# Patient Record
Sex: Male | Born: 1975 | Race: White | Hispanic: No | Marital: Single | Smoking: Never smoker
Health system: Southern US, Community
[De-identification: ages and names within clinical notes are randomized; demographics above are authoritative.]

## PROBLEM LIST (undated history)

## (undated) DIAGNOSIS — Z789 Other specified health status: Secondary | ICD-10-CM

## (undated) DIAGNOSIS — C801 Malignant (primary) neoplasm, unspecified: Secondary | ICD-10-CM

## (undated) HISTORY — PX: SQUAMOUS CELL CARCINOMA EXCISION: SHX2433

---

## 2014-04-23 ENCOUNTER — Encounter (HOSPITAL_COMMUNITY): Payer: Self-pay | Admitting: Emergency Medicine

## 2014-04-23 ENCOUNTER — Emergency Department (HOSPITAL_COMMUNITY): Payer: Medicaid Other

## 2014-04-23 ENCOUNTER — Inpatient Hospital Stay (HOSPITAL_COMMUNITY)
Admission: EM | Admit: 2014-04-23 | Discharge: 2014-04-27 | DRG: 356 | Disposition: A | Discharge status: Home / Self Care | Payer: Medicaid Other | Attending: Internal Medicine | Admitting: Internal Medicine

## 2014-04-23 DIAGNOSIS — I4581 Long QT syndrome: Secondary | ICD-10-CM | POA: Diagnosis present

## 2014-04-23 DIAGNOSIS — C778 Secondary and unspecified malignant neoplasm of lymph nodes of multiple regions: Secondary | ICD-10-CM | POA: Diagnosis present

## 2014-04-23 DIAGNOSIS — C169 Malignant neoplasm of stomach, unspecified: Secondary | ICD-10-CM | POA: Diagnosis present

## 2014-04-23 DIAGNOSIS — R9431 Abnormal electrocardiogram [ECG] [EKG]: Secondary | ICD-10-CM | POA: Diagnosis present

## 2014-04-23 DIAGNOSIS — C786 Secondary malignant neoplasm of retroperitoneum and peritoneum: Principal | ICD-10-CM | POA: Diagnosis present

## 2014-04-23 DIAGNOSIS — R634 Abnormal weight loss: Secondary | ICD-10-CM | POA: Diagnosis present

## 2014-04-23 DIAGNOSIS — R109 Unspecified abdominal pain: Secondary | ICD-10-CM | POA: Diagnosis present

## 2014-04-23 DIAGNOSIS — Z6841 Body Mass Index (BMI) 40.0 and over, adult: Secondary | ICD-10-CM | POA: Diagnosis not present

## 2014-04-23 DIAGNOSIS — C799 Secondary malignant neoplasm of unspecified site: Secondary | ICD-10-CM

## 2014-04-23 DIAGNOSIS — R180 Malignant ascites: Secondary | ICD-10-CM | POA: Diagnosis present

## 2014-04-23 DIAGNOSIS — Z85828 Personal history of other malignant neoplasm of skin: Secondary | ICD-10-CM

## 2014-04-23 DIAGNOSIS — E43 Unspecified severe protein-calorie malnutrition: Secondary | ICD-10-CM | POA: Diagnosis present

## 2014-04-23 DIAGNOSIS — C160 Malignant neoplasm of cardia: Secondary | ICD-10-CM | POA: Diagnosis present

## 2014-04-23 DIAGNOSIS — C801 Malignant (primary) neoplasm, unspecified: Secondary | ICD-10-CM

## 2014-04-23 DIAGNOSIS — R1084 Generalized abdominal pain: Secondary | ICD-10-CM

## 2014-04-23 DIAGNOSIS — R918 Other nonspecific abnormal finding of lung field: Secondary | ICD-10-CM | POA: Diagnosis present

## 2014-04-23 DIAGNOSIS — D500 Iron deficiency anemia secondary to blood loss (chronic): Secondary | ICD-10-CM | POA: Diagnosis present

## 2014-04-23 HISTORY — DX: Other specified health status: Z78.9

## 2014-04-23 LAB — URINALYSIS, ROUTINE W REFLEX MICROSCOPIC
BILIRUBIN URINE: NEGATIVE
Glucose, UA: NEGATIVE mg/dL
Hgb urine dipstick: NEGATIVE
Ketones, ur: NEGATIVE mg/dL
LEUKOCYTES UA: NEGATIVE
NITRITE: NEGATIVE
Protein, ur: NEGATIVE mg/dL
SPECIFIC GRAVITY, URINE: 1.022 (ref 1.005–1.030)
UROBILINOGEN UA: 1 mg/dL (ref 0.0–1.0)
pH: 7 (ref 5.0–8.0)

## 2014-04-23 LAB — COMPREHENSIVE METABOLIC PANEL
ALK PHOS: 101 U/L (ref 39–117)
ALT: 14 U/L (ref 0–53)
AST: 22 U/L (ref 0–37)
Albumin: 2.8 g/dL — ABNORMAL LOW (ref 3.5–5.2)
Anion gap: 17 — ABNORMAL HIGH (ref 5–15)
BUN: 18 mg/dL (ref 6–23)
CHLORIDE: 95 meq/L — AB (ref 96–112)
CO2: 23 mEq/L (ref 19–32)
Calcium: 9.1 mg/dL (ref 8.4–10.5)
Creatinine, Ser: 0.97 mg/dL (ref 0.50–1.35)
GFR calc Af Amer: >90 mL/min (ref >90)
GFR calc non Af Amer: >90 mL/min (ref >90)
Glucose, Bld: 113 mg/dL — ABNORMAL HIGH (ref 70–99)
Potassium: 4.1 mEq/L (ref 3.7–5.3)
Sodium: 135 mEq/L — ABNORMAL LOW (ref 137–147)
TOTAL PROTEIN: 7.4 g/dL (ref 6.0–8.3)
Total Bilirubin: 0.5 mg/dL (ref 0.3–1.2)

## 2014-04-23 LAB — CBC WITH DIFFERENTIAL/PLATELET
BASOS PCT: 0 % (ref 0–1)
Basophils Absolute: 0 10*3/uL (ref 0.0–0.1)
Eosinophils Absolute: 0 10*3/uL (ref 0.0–0.7)
Eosinophils Relative: 0 % (ref 0–5)
HEMATOCRIT: 38.6 % — AB (ref 39.0–52.0)
Hemoglobin: 12.5 g/dL — ABNORMAL LOW (ref 13.0–17.0)
Lymphocytes Relative: 16 % (ref 12–46)
Lymphs Abs: 1.7 10*3/uL (ref 0.7–4.0)
MCH: 24.5 pg — ABNORMAL LOW (ref 26.0–34.0)
MCHC: 32.4 g/dL (ref 30.0–36.0)
MCV: 75.5 fL — AB (ref 78.0–100.0)
MONOS PCT: 11 % (ref 3–12)
Monocytes Absolute: 1.3 10*3/uL — ABNORMAL HIGH (ref 0.1–1.0)
NEUTROS ABS: 8 10*3/uL — AB (ref 1.7–7.7)
Neutrophils Relative %: 73 % (ref 43–77)
Platelets: 579 10*3/uL — ABNORMAL HIGH (ref 150–400)
RBC: 5.11 MIL/uL (ref 4.22–5.81)
RDW: 14.5 % (ref 11.5–15.5)
WBC: 11.1 10*3/uL — ABNORMAL HIGH (ref 4.0–10.5)

## 2014-04-23 LAB — APTT: aPTT: 31 seconds (ref 24–37)

## 2014-04-23 LAB — I-STAT TROPONIN, ED: Troponin i, poc: 0 ng/mL (ref 0.00–0.08)

## 2014-04-23 LAB — PROTIME-INR
INR: 1.18 (ref 0.00–1.49)
Prothrombin Time: 15.2 seconds (ref 11.6–15.2)

## 2014-04-23 LAB — LIPASE, BLOOD: Lipase: 25 U/L (ref 11–59)

## 2014-04-23 MED ORDER — MORPHINE SULFATE 2 MG/ML IJ SOLN
2.0000 mg | INTRAMUSCULAR | Status: DC | PRN
  Administered 2014-04-24 – 2014-04-25 (×3): 2 mg via INTRAVENOUS
  Filled 2014-04-23 (×3): qty 1

## 2014-04-23 MED ORDER — IOHEXOL 300 MG/ML  SOLN
100.0000 mL | Freq: Once | INTRAMUSCULAR | Status: AC | PRN
  Administered 2014-04-23: 100 mL via INTRAVENOUS

## 2014-04-23 MED ORDER — MORPHINE SULFATE 4 MG/ML IJ SOLN
6.0000 mg | Freq: Once | INTRAMUSCULAR | Status: AC
  Administered 2014-04-23: 6 mg via INTRAVENOUS
  Filled 2014-04-23: qty 2

## 2014-04-23 MED ORDER — MORPHINE SULFATE ER 15 MG PO TBCR
15.0000 mg | EXTENDED_RELEASE_TABLET | Freq: Two times a day (BID) | ORAL | Status: DC
  Administered 2014-04-23 – 2014-04-27 (×8): 15 mg via ORAL
  Filled 2014-04-23 (×8): qty 1

## 2014-04-23 MED ORDER — HYDROXYZINE HCL 50 MG/ML IM SOLN
25.0000 mg | Freq: Four times a day (QID) | INTRAMUSCULAR | Status: DC | PRN
  Administered 2014-04-24: 25 mg via INTRAMUSCULAR
  Filled 2014-04-23 (×3): qty 0.5

## 2014-04-23 MED ORDER — ONDANSETRON HCL 4 MG/2ML IJ SOLN
4.0000 mg | Freq: Once | INTRAMUSCULAR | Status: AC
  Administered 2014-04-23: 4 mg via INTRAVENOUS
  Filled 2014-04-23: qty 2

## 2014-04-23 MED ORDER — CETYLPYRIDINIUM CHLORIDE 0.05 % MT LIQD
7.0000 mL | Freq: Two times a day (BID) | OROMUCOSAL | Status: DC
  Administered 2014-04-24 – 2014-04-26 (×4): 7 mL via OROMUCOSAL

## 2014-04-23 MED ORDER — FAMOTIDINE IN NACL 20-0.9 MG/50ML-% IV SOLN
20.0000 mg | Freq: Once | INTRAVENOUS | Status: AC
  Administered 2014-04-23: 20 mg via INTRAVENOUS
  Filled 2014-04-23: qty 50

## 2014-04-23 MED ORDER — HEPARIN SODIUM (PORCINE) 5000 UNIT/ML IJ SOLN
5000.0000 [IU] | Freq: Three times a day (TID) | INTRAMUSCULAR | Status: DC
  Administered 2014-04-23 – 2014-04-27 (×9): 5000 [IU] via SUBCUTANEOUS
  Filled 2014-04-23 (×14): qty 1

## 2014-04-23 MED ORDER — PANTOPRAZOLE SODIUM 40 MG IV SOLR
40.0000 mg | Freq: Once | INTRAVENOUS | Status: AC
  Administered 2014-04-23: 40 mg via INTRAVENOUS
  Filled 2014-04-23: qty 40

## 2014-04-23 MED ORDER — SODIUM CHLORIDE 0.9 % IV SOLN
INTRAVENOUS | Status: DC
  Administered 2014-04-23 – 2014-04-25 (×5): via INTRAVENOUS

## 2014-04-23 MED ORDER — CHLORHEXIDINE GLUCONATE 0.12 % MT SOLN
15.0000 mL | Freq: Two times a day (BID) | OROMUCOSAL | Status: DC
  Administered 2014-04-24 – 2014-04-27 (×7): 15 mL via OROMUCOSAL
  Filled 2014-04-23 (×10): qty 15

## 2014-04-23 MED ORDER — FAMOTIDINE IN NACL 20-0.9 MG/50ML-% IV SOLN
20.0000 mg | Freq: Two times a day (BID) | INTRAVENOUS | Status: DC
  Administered 2014-04-24 – 2014-04-27 (×7): 20 mg via INTRAVENOUS
  Filled 2014-04-23 (×7): qty 50

## 2014-04-23 NOTE — H&P (Signed)
[facility] History and Physical  Samuel Durham Samuel Durham:[id number] DOB: 10/23/1975 DOA: 04/23/2014  Referring physician: ED physician PCP: No primary care provider on file.  Specialists:   Chief Complaint: Abdominal pain   HPI: Samuel Durham is a 38 y.o. male with PMH of squamous cell skin cancer, who presents with abdominal pain.  Patient reports that he started having abdominal pain 4 month ago. It is located in the epigastric area. it was initially mild, intermittent pain. He did not pay much attention. During the last month, his abdominal pain has been progressively getting worse. He started having nausea and vomiting whenever he eats food. He also has loose stools. He has very mild, occasional acid reflux problem in the past. He reports 30 pounds of body weight loss over past 4 months. He has occasional mild dry cough, no chest pain or shortness of breath. No family history full gastric or esophageal cancer. One of her sisters has breast cancer.  Patient denies fever, chills, headaches, chest pain, SOB, dysuria, urgency, frequency, hematuria, skin rashes, joint pain or leg swelling.  In ED, CT-abd/pelvis showed possible esophageal or gastric primary malignancy, with metastatic lymphadenopathy in the upper abdomen and retroperitoneum, widespread intraperitoneal spread of disease, malignant ascites, and multiple pulmonary nodules in the visualize lung bases concerning for pulmonary metastasis. Patient is admitted to inpatient for further evaluation and treatment.  Review of Systems: As presented in the history of presenting illness, rest negative.  Where does patient live?  At home Can patient participate in ADLs? Yes  Allergy: No Known Allergies  Past Medical History  Diagnosis Date  . Medical history non-contributory     Past Surgical History  Procedure Laterality Date  . Squamous cell carcinoma excision  approx March 2015    removed from top of head    Social History:  reports  that he has never smoked. He has never used smokeless tobacco. He reports that he does not drink alcohol or use illicit drugs.  Family History:  Family History  Problem Relation Age of Onset  . Breast cancer Sister   . Hypertension Mother   . Hypertension Father      Prior to Admission medications   Not on File    Physical Exam: Filed Vitals:   04/23/14 1902 04/23/14 1925 04/23/14 2000 04/23/14 2101  BP: 125/77 141/92 124/74 143/84  Pulse: 86 84 78 85  Temp:    98.1 F (36.7 C)  TempSrc:    Oral  Resp: 19 18 20 18   SpO2: 96% 97% 95% 95%   General: Not in acute distress HEENT:       Eyes: PERRL, EOMI, no scleral icterus       ENT: No discharge from the ears and nose, no pharynx injection, no tonsillar enlargement.        Neck: No JVD, no bruit, no mass felt. Cardiac: S1/S2, RRR, No murmurs, No gallops or rubs Pulm: Good air movement bilaterally. Clear to auscultation bilaterally. No rales, wheezing, rhonchi or rubs. Abd: Soft, mildly distended, mildly tender over epigastric area, no rebound pain, no organomegaly, BS present Ext: No edema bilaterally. 2+DP/PT pulse bilaterally Musculoskeletal: No joint deformities, erythema, or stiffness, ROM full Skin: No rashes.  Neuro: Alert and oriented X3, cranial nerves II-XII grossly intact, muscle strength 5/5 in all extremeties, sensation to light touch intact.  Psych: Patient is not psychotic, no suicidal or hemocidal ideation.  Labs on Admission:  Basic Metabolic Panel:  Recent Labs Lab 04/23/14 1624  NA 135*  K 4.1  CL 95*  CO2 23  GLUCOSE 113*  BUN 18  CREATININE 0.97  CALCIUM 9.1   Liver Function Tests:  Recent Labs Lab 04/23/14 1624  AST 22  ALT 14  ALKPHOS 101  BILITOT 0.5  PROT 7.4  ALBUMIN 2.8*    Recent Labs Lab 04/23/14 1624  LIPASE 25   No results for input(s): AMMONIA in the last 168 hours. CBC:  Recent Labs Lab 04/23/14 1624  WBC 11.1*  NEUTROABS 8.0*  HGB 12.5*  HCT 38.6*  MCV  75.5*  PLT 579*   Cardiac Enzymes: No results for input(s): CKTOTAL, CKMB, CKMBINDEX, TROPONINI in the last 168 hours.  BNP (last 3 results) No results for input(s): PROBNP in the last 8760 hours. CBG: No results for input(s): GLUCAP in the last 168 hours.  Radiological Exams on Admission: Ct Abdomen Pelvis W Contrast  04/23/2014   CLINICAL DATA:  38 year old male with epigastric pain, vomiting and dysphagia for the past several months.  EXAM: CT ABDOMEN AND PELVIS WITH CONTRAST  TECHNIQUE: Multidetector CT imaging of the abdomen and pelvis was performed using the standard protocol following bolus administration of intravenous contrast.  CONTRAST:  11mL OMNIPAQUE IOHEXOL 300 MG/ML  SOLN  COMPARISON:  No priors.  FINDINGS: Lower chest: Several pulmonary nodules are noted in the visualized lung bases bilaterally, the largest of which measures up to 1 cm in the right lower lobe (image 12 of series 3). Profound thickening of the distal esophagus extending beyond the gastroesophageal junction into the proximal stomach.  Hepatobiliary: No cystic or solid hepatic lesions. No intra or extrahepatic biliary ductal dilatation. Gallbladder is remarkable for some amorphous increased attenuation dependently, likely to reflect some biliary sludge.  Pancreas: Fatty atrophy in the pancreas.  Otherwise, unremarkable.  Spleen: Unremarkable.  Adrenals/Urinary Tract: Bilateral adrenal glands and the right kidney are unremarkable in appearance. Exophytic 2.4 cm low-attenuation lesion in the anterior aspect of the upper pole of the left kidney is compatible with a simple cyst. No hydroureteronephrosis. Urinary bladder is normal in appearance.  Stomach/Bowel: Marked thickening of the gastric wall at the gastroesophageal junction, extending into the cardia and fundus of the stomach, most evident along the lesser curvature. This is contiguous with the previously mentioned esophageal wall thickening. No pathologic dilatation of  small bowel or colon.  Vascular/Lymphatic: Extensive upper abdominal lymphadenopathy, predominantly in the gastrohepatic ligament and celiac axis nodal stations, with the largest single lymph node measuring up to 3.7 x 3.0 cm in the celiac axis nodal station (image 27 of series 2). Portacaval lymphadenopathy measuring up to 1.7 cm. Notably, many of the enlarged celiac axis lymph nodes appear partially calcified, which could suggest a mucinous subtype of neoplasm. Enlarged left paraaortic retroperitoneal lymph nodes measuring up to 2.4 x 4.2 cm adjacent to the left renal vein (image 33 of series 2). No significant atherosclerotic disease in the abdominal or pelvic vasculature.  Reproductive: Prostate gland and seminal vesicles are unremarkable in appearance.  Other: Moderate volume of ascites, presumably malignant. Extensive soft tissue nodularity throughout the omentum, compatible with omental caking and widespread peritoneal metastasis. Additionally, there are other areas of enhancing nodularity along the peritoneal surface. Tiny ventral and umbilical hernias containing predominantly omental fat, including some omental implants. No pneumoperitoneum.  Musculoskeletal: There are no aggressive appearing lytic or blastic lesions noted in the visualized portions of the skeleton.  IMPRESSION: 1. Findings, as above, highly concerning for either esophageal or gastric primary malignancy, with metastatic lymphadenopathy in the  upper abdomen and retroperitoneum, widespread intraperitoneal spread of disease, malignant ascites, and multiple pulmonary nodules in the visualize lung bases concerning for pulmonary metastasis. Oncologic evaluation is recommended. 2. Additional incidental findings, as above. These results were called by telephone at the time of interpretation on 04/23/2014 at 7:53 pm to [place] , who verbally acknowledged these results.   Electronically Signed   By: Vinnie Langton M.D.   On: 04/23/2014  19:56    EKG: Independently reviewed.   Assessment/Plan Active Problems:   Metastatic cancer   Abdominal pain   QT prolongation  Abdominal pain: it is most likely caused by metastatic cancers as evidenced by CT scan. Lipase negative, urinalysis negative, troponin negative. Patient also has nausea, vomiting, cannot tolerate food, but not severely dehydrated. Hemodynamically stable on admission. Electrolytes okay.  -will admit to med-surge bed -Symptomatic management -Hydroxyzine for nausea (patient has prolonged QTc interval 568, not good candidate for Zofran) -MS contin and prn morphine for pain -IV pepcid -check INR/PTT -NPO after MN -may consult to Oncology in AM  Prolonged QTc interval: QTC 568 on admission. No chest pain. -Avoid QTc prolonging medications, such as Zofran -repeat EKG in AM  DVT ppx: SQ Heparin     Code Status: Full code Family Communication:  Yes, patient's  2 sisters  at bed side Disposition Plan: Admit to inpatient   Date of Service 04/23/2014    Ivor Costa [facility] Pager [phone]  If 7PM-7AM, please contact night-coverage [email or web address] Password [email or web address] 04/23/2014, 9:18 PM

## 2014-04-23 NOTE — ED Notes (Signed)
PA and this RN at bedside. PA discussing CT results.

## 2014-04-23 NOTE — ED Notes (Addendum)
Pt c/o central epigastric pain preventing him from eating, states when he eats something he vomits immediately, diarrhea. Pt states this started in September but has progressively worsened. Pt states that he has passed a minimal amount of stool.

## 2014-04-23 NOTE — ED Provider Notes (Signed)
CSN: [id number]     Arrival date & time 04/23/14  1602 History   First MD Initiated Contact with Patient 04/23/14 1606     Chief Complaint  Patient presents with  . Abdominal Pain  . Emesis     (Consider location/radiation/quality/duration/timing/severity/associated sxs/prior Treatment) HPI  Pt is a 38yo male presenting to ED with c/o epigastric pain associated with nausea and vomiting, gradually worsening since about September of this year.  Pt states pain is aching and sharp, waxes and wanes, 10/10 at worst. States food use to make it worse but for the last 2-3 days he has not been able to even drink any water as he vomites it back up. Also reports even the smell of some foods make him nauseated.  He reports trying antacid medications last month but they did not help so he discontinued the OTC medications. Reports having a BM about once a week but states recently he has been having diarrhea.  Denies blood or mucous in stool. Denies urinary symptoms. No hx of pancreatitis or kidney stones. Denies drinking etoh. Denies hx of abdominal surgeries.    Past Medical History  Diagnosis Date  . Medical history non-contributory    Past Surgical History  Procedure Laterality Date  . Squamous cell carcinoma excision  approx March 2015    removed from top of head   Family History  Problem Relation Age of Onset  . Breast cancer Sister   . Hypertension Mother   . Hypertension Father    History  Substance Use Topics  . Smoking status: Never Smoker   . Smokeless tobacco: Never Used  . Alcohol Use: No    Review of Systems  Constitutional: Negative for fever and chills.  Respiratory: Negative for cough and shortness of breath.   Cardiovascular: Negative for chest pain and palpitations.  Gastrointestinal: Positive for nausea, vomiting and abdominal pain. Negative for diarrhea and constipation.  All other systems reviewed and are negative.     Allergies  Review of patient's allergies  indicates no known allergies.  Home Medications   Prior to Admission medications   Not on File   BP 138/85 mmHg  Pulse 87  Temp(Src) 98.1 F (36.7 C) (Oral)  Resp 16  Ht 5\' 9"  (1.753 m)  Wt 288 lb 8 oz (130.863 kg)  BMI 42.58 kg/m2  SpO2 98% Physical Exam  Constitutional: He appears well-developed and well-nourished.  Morbidly obese male lying in exam bed, NAD.  HENT:  Head: Normocephalic and atraumatic.  Eyes: Conjunctivae are normal. No scleral icterus.  Neck: Normal range of motion.  Cardiovascular: Normal rate, regular rhythm and normal heart sounds.   Pulmonary/Chest: Effort normal and breath sounds normal. No respiratory distress. He has no wheezes. He has no rales. He exhibits no tenderness.  Abdominal: Soft. Bowel sounds are normal. He exhibits no distension and no mass. There is tenderness. There is no rebound and no guarding.  Obese abdomen, soft, tenderness in epigastrium as well as mid abdomen and RLQ.  No focal tenderness. Exam limited due to body habitus.   Musculoskeletal: Normal range of motion.  Neurological: He is alert.  Skin: Skin is warm and dry.  Nursing note and vitals reviewed.   ED Course  Procedures (including critical care time) Labs Review Labs Reviewed  CBC WITH DIFFERENTIAL - Abnormal; Notable for the following:    WBC 11.1 (*)    Hemoglobin 12.5 (*)    HCT 38.6 (*)    MCV 75.5 (*)  MCH 24.5 (*)    Platelets 579 (*)    Neutro Abs 8.0 (*)    Monocytes Absolute 1.3 (*)    All other components within normal limits  COMPREHENSIVE METABOLIC PANEL - Abnormal; Notable for the following:    Sodium 135 (*)    Chloride 95 (*)    Glucose, Bld 113 (*)    Albumin 2.8 (*)    Anion gap 17 (*)    All other components within normal limits  LIPASE, BLOOD  URINALYSIS, ROUTINE W REFLEX MICROSCOPIC  PROTIME-INR  APTT  I-STAT TROPOININ, ED    Imaging Review Ct Abdomen Pelvis W Contrast  04/23/2014   CLINICAL DATA:  38 year old male with  epigastric pain, vomiting and dysphagia for the past several months.  EXAM: CT ABDOMEN AND PELVIS WITH CONTRAST  TECHNIQUE: Multidetector CT imaging of the abdomen and pelvis was performed using the standard protocol following bolus administration of intravenous contrast.  CONTRAST:  12mL OMNIPAQUE IOHEXOL 300 MG/ML  SOLN  COMPARISON:  No priors.  FINDINGS: Lower chest: Several pulmonary nodules are noted in the visualized lung bases bilaterally, the largest of which measures up to 1 cm in the right lower lobe (image 12 of series 3). Profound thickening of the distal esophagus extending beyond the gastroesophageal junction into the proximal stomach.  Hepatobiliary: No cystic or solid hepatic lesions. No intra or extrahepatic biliary ductal dilatation. Gallbladder is remarkable for some amorphous increased attenuation dependently, likely to reflect some biliary sludge.  Pancreas: Fatty atrophy in the pancreas.  Otherwise, unremarkable.  Spleen: Unremarkable.  Adrenals/Urinary Tract: Bilateral adrenal glands and the right kidney are unremarkable in appearance. Exophytic 2.4 cm low-attenuation lesion in the anterior aspect of the upper pole of the left kidney is compatible with a simple cyst. No hydroureteronephrosis. Urinary bladder is normal in appearance.  Stomach/Bowel: Marked thickening of the gastric wall at the gastroesophageal junction, extending into the cardia and fundus of the stomach, most evident along the lesser curvature. This is contiguous with the previously mentioned esophageal wall thickening. No pathologic dilatation of small bowel or colon.  Vascular/Lymphatic: Extensive upper abdominal lymphadenopathy, predominantly in the gastrohepatic ligament and celiac axis nodal stations, with the largest single lymph node measuring up to 3.7 x 3.0 cm in the celiac axis nodal station (image 27 of series 2). Portacaval lymphadenopathy measuring up to 1.7 cm. Notably, many of the enlarged celiac axis lymph  nodes appear partially calcified, which could suggest a mucinous subtype of neoplasm. Enlarged left paraaortic retroperitoneal lymph nodes measuring up to 2.4 x 4.2 cm adjacent to the left renal vein (image 33 of series 2). No significant atherosclerotic disease in the abdominal or pelvic vasculature.  Reproductive: Prostate gland and seminal vesicles are unremarkable in appearance.  Other: Moderate volume of ascites, presumably malignant. Extensive soft tissue nodularity throughout the omentum, compatible with omental caking and widespread peritoneal metastasis. Additionally, there are other areas of enhancing nodularity along the peritoneal surface. Tiny ventral and umbilical hernias containing predominantly omental fat, including some omental implants. No pneumoperitoneum.  Musculoskeletal: There are no aggressive appearing lytic or blastic lesions noted in the visualized portions of the skeleton.  IMPRESSION: 1. Findings, as above, highly concerning for either esophageal or gastric primary malignancy, with metastatic lymphadenopathy in the upper abdomen and retroperitoneum, widespread intraperitoneal spread of disease, malignant ascites, and multiple pulmonary nodules in the visualize lung bases concerning for pulmonary metastasis. Oncologic evaluation is recommended. 2. Additional incidental findings, as above. These results were called by telephone  at the time of interpretation on 04/23/2014 at 7:53 pm to [place] , who verbally acknowledged these results.   Electronically Signed   By: Vinnie Langton M.D.   On: 04/23/2014 19:56     EKG Interpretation   Date/Time:  Tuesday April 23 2014 16:15:32 EST Ventricular Rate:  97 PR Interval:  137 QRS Duration: 87 QT Interval:  447 QTC Calculation: 568 R Axis:   75 Text Interpretation:  Sinus rhythm Prolonged QT interval no previous for  comparison Confirmed by HARRISON  MD, FORREST ([id number]) on 04/23/2014 4:20:22  PM      MDM   Final  diagnoses:  Metastatic cancer  Generalized abdominal pain    Pt is a 38yo pleasant male presenting to ED with reports of upper abdominal pain with nausea, vomiting and diarrhea. Gradually worsening since September 2015.  Pt has a morbidly obese abdomen, but otherwise appears well, afebrile. NAD.  Abdominal exam limited by body habitus, tenderness in upper, mid, and right side of abdomen.    Discussed pt with Dr. Aline Brochure, due to limited exam but reports of 10/10 pain, CT abdomen ordered. DDx: SBO, cholecystitis vs cholelithiasis, low concern for appendicitis, GERD, gastritis  Labs: elevated platelets at 579, and low albumin of 2.8, no previous labs to compare. Labs otherwise unremarkable including normal lipase.    CT abd: findings highly concerning for esophageal or gastric Primary malignancy, with mteastatic lymphadeopathy in upper abdomen and retroperitoneum, widespread intraperitoneal spread of disease, malignant ascites, and multiple pulmonary nodules in visualized lung bases, concerning for pulmonary metastasis.  Oncologic evaluation recommended.    Discussed results with pt as well as his sister who was accompanying pt.  Sister reports she recently went into remission from breast cancer. No other family hx of cancer.    Discussed pt with Dr. 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    Pt has been given IV morphine, zofran, pepcid, and protonix in ED which did help improve pain from 10/10 to 5/10.   8:38 PM Consulted with Dr. Blaine Hamper, [facility], will admit pt to a med-surg bed.  Pt is hemodynamically stable at this time.      Noland Fordyce, PA-C 04/24/14 3846  Pamella Pert, MD 04/25/14 [phone]

## 2014-04-23 NOTE — ED Notes (Signed)
Patient transported to CT 

## 2014-04-23 NOTE — ED Notes (Signed)
Unsuccessful attempts x2. Another RN to attempt

## 2014-04-23 NOTE — ED Notes (Signed)
Hospitalist at bedside 

## 2014-04-23 NOTE — ED Notes (Signed)
3W floor RN Kennyth Lose unable to take report at this time. She reports she will call back in 5 min.

## 2014-04-24 DIAGNOSIS — E43 Unspecified severe protein-calorie malnutrition: Secondary | ICD-10-CM | POA: Diagnosis present

## 2014-04-24 DIAGNOSIS — R109 Unspecified abdominal pain: Secondary | ICD-10-CM

## 2014-04-24 DIAGNOSIS — E46 Unspecified protein-calorie malnutrition: Secondary | ICD-10-CM

## 2014-04-24 DIAGNOSIS — D509 Iron deficiency anemia, unspecified: Secondary | ICD-10-CM

## 2014-04-24 DIAGNOSIS — R112 Nausea with vomiting, unspecified: Secondary | ICD-10-CM

## 2014-04-24 DIAGNOSIS — R1013 Epigastric pain: Secondary | ICD-10-CM

## 2014-04-24 DIAGNOSIS — D473 Essential (hemorrhagic) thrombocythemia: Secondary | ICD-10-CM

## 2014-04-24 DIAGNOSIS — Z85820 Personal history of malignant melanoma of skin: Secondary | ICD-10-CM

## 2014-04-24 DIAGNOSIS — C801 Malignant (primary) neoplasm, unspecified: Secondary | ICD-10-CM

## 2014-04-24 LAB — COMPREHENSIVE METABOLIC PANEL
ALBUMIN: 2.4 g/dL — AB (ref 3.5–5.2)
ALK PHOS: 87 U/L (ref 39–117)
ALT: 11 U/L (ref 0–53)
AST: 16 U/L (ref 0–37)
Anion gap: 12 (ref 5–15)
BILIRUBIN TOTAL: 0.4 mg/dL (ref 0.3–1.2)
BUN: 17 mg/dL (ref 6–23)
CHLORIDE: 96 meq/L (ref 96–112)
CO2: 26 mEq/L (ref 19–32)
Calcium: 8.7 mg/dL (ref 8.4–10.5)
Creatinine, Ser: 0.99 mg/dL (ref 0.50–1.35)
GFR calc Af Amer: >90 mL/min (ref >90)
GFR calc non Af Amer: >90 mL/min (ref >90)
Glucose, Bld: 91 mg/dL (ref 70–99)
POTASSIUM: 4.3 meq/L (ref 3.7–5.3)
SODIUM: 134 meq/L — AB (ref 137–147)
TOTAL PROTEIN: 6.4 g/dL (ref 6.0–8.3)

## 2014-04-24 LAB — CBC WITH DIFFERENTIAL/PLATELET
BASOS PCT: 0 % (ref 0–1)
Basophils Absolute: 0 10*3/uL (ref 0.0–0.1)
EOS ABS: 0.1 10*3/uL (ref 0.0–0.7)
Eosinophils Relative: 1 % (ref 0–5)
HEMATOCRIT: 36.4 % — AB (ref 39.0–52.0)
Hemoglobin: 11.3 g/dL — ABNORMAL LOW (ref 13.0–17.0)
Lymphocytes Relative: 15 % (ref 12–46)
Lymphs Abs: 1.4 10*3/uL (ref 0.7–4.0)
MCH: 23.7 pg — AB (ref 26.0–34.0)
MCHC: 31 g/dL (ref 30.0–36.0)
MCV: 76.5 fL — ABNORMAL LOW (ref 78.0–100.0)
MONO ABS: 1 10*3/uL (ref 0.1–1.0)
Monocytes Relative: 11 % (ref 3–12)
Neutro Abs: 6.7 10*3/uL (ref 1.7–7.7)
Neutrophils Relative %: 73 % (ref 43–77)
Platelets: 539 10*3/uL — ABNORMAL HIGH (ref 150–400)
RBC: 4.76 MIL/uL (ref 4.22–5.81)
RDW: 14.6 % (ref 11.5–15.5)
WBC: 9.2 10*3/uL (ref 4.0–10.5)

## 2014-04-24 LAB — GLUCOSE, CAPILLARY: Glucose-Capillary: 95 mg/dL (ref 70–99)

## 2014-04-24 LAB — MAGNESIUM: Magnesium: 2.2 mg/dL (ref 1.5–2.5)

## 2014-04-24 MED ORDER — DOCUSATE SODIUM 100 MG PO CAPS
100.0000 mg | ORAL_CAPSULE | Freq: Two times a day (BID) | ORAL | Status: DC
Start: 2014-04-24 — End: 2014-04-27
  Administered 2014-04-24 – 2014-04-27 (×6): 100 mg via ORAL
  Filled 2014-04-24 (×8): qty 1

## 2014-04-24 NOTE — Progress Notes (Signed)
INITIAL NUTRITION ASSESSMENT  DOCUMENTATION CODES Per approved criteria  -Severe malnutrition in the context of chronic illness  Pt meets criteria for severe MALNUTRITION in the context of chronic illness as evidenced by 12% weight loss x 3 months and energy intake <75% for >1 month.   INTERVENTION: -Encourage PO intake -RD to follow-up with pt in 1-2 days for nutritional supplementation per pt request  NUTRITION DIAGNOSIS: Inadequate oral intake related to N/V, abdominal pain as evidenced by 12% wt loss x 3 months.   Goal: Pt to meet >/= 90% of their estimated nutrition needs   Monitor:  PO and supplemental intake, weight, labs, I/O's  Reason for Assessment: Pt identified as at nutrition risk on the Malnutrition Screen Tool  Admitting Dx: Abdominal pain  ASSESSMENT: 38 y.o. male with PMH of squamous cell skin cancer, who presents with abdominal pain x 4 months. He started having nausea and vomiting whenever he eats food. He also has loose stools. He reports 30 pounds of body weight loss over past 4 months.  Pt reports 40 lb weight loss over the last 3-4 months (12% wt loss x 3 months, significant for time frame). Pt has not been able to keep food down during that time period d/t abdominal pain. Prior to this weight loss, pt reports intentional wt loss using Nutrisystem diet (UBW of 379 lb).  Per RN, pt is tolerating CL diet so far.  Pt would benefit from nutritional supplementation, pt would like to see how he tolerates his diet first before adding supplements. RD to follow-up with patient in 1-2 days.  Nutrition focused physical exam shows no sign of depletion of muscle mass or body fat.  Labs reviewed: Low Na  Height: Ht Readings from Last 1 Encounters:  04/23/14 5\' 9"  (1.753 m)    Weight: Wt Readings from Last 1 Encounters:  04/24/14 288 lb 12.8 oz (130.999 kg)    Ideal Body Weight: 160 lb  % Ideal Body Weight: 180%  Wt Readings from Last 10 Encounters:   04/24/14 288 lb 12.8 oz (130.999 kg)    Usual Body Weight: 379 -per pt in 2013  % Usual Body Weight: 76%  BMI:  Body mass index is 42.63 kg/(m^2).  Estimated Nutritional Needs: Kcal: 2600-2800 Protein: 125-135g Fluid: 2.6L/day  Skin: intact  Diet Order: Diet NPO time specified Except for: Sips with Meds  EDUCATION NEEDS: -Education not appropriate at this time   Intake/Output Summary (Last 24 hours) at 04/24/14 1155 Last data filed at 04/24/14 0548  Gross per 24 hour  Intake  964.6 ml  Output      0 ml  Net  964.6 ml    Last BM: 12/15  Labs:   Recent Labs Lab 04/23/14 1624 04/24/14 0845  NA 135* 134*  K 4.1 4.3  CL 95* 96  CO2 23 26  BUN 18 17  CREATININE 0.97 0.99  CALCIUM 9.1 8.7  MG  --  2.2  GLUCOSE 113* 91    CBG (last 3)   Recent Labs  04/24/14 0812  GLUCAP 95    Scheduled Meds: . antiseptic oral rinse  7 mL Mouth Rinse q12n4p  . chlorhexidine  15 mL Mouth Rinse BID  . famotidine (PEPCID) IV  20 mg Intravenous Q12H  . heparin  5,000 Units Subcutaneous 3 times per day  . morphine  15 mg Oral Q12H    Continuous Infusions: . sodium chloride 125 mL/hr at 04/24/14 0546    Past Medical History  Diagnosis  Date  . Medical history non-contributory     Past Surgical History  Procedure Laterality Date  . Squamous cell carcinoma excision  approx March 2015    removed from top of head    Clayton Bibles, MS, RD, LDN Pager: [phone] After Hours Pager: [phone]

## 2014-04-24 NOTE — Plan of Care (Signed)
Problem: Phase I Progression Outcomes Goal: Pain controlled with appropriate interventions Outcome: Completed/Met Date Met:  04/24/14 Pain well controlled with MS Contin and occasional prn IV Morphine.  Problem: Phase II Progression Outcomes Goal: Progress activity as tolerated unless otherwise ordered Outcome: Completed/Met Date Met:  04/24/14 Patient ambulating per self in room without difficulty.

## 2014-04-24 NOTE — Consult Note (Signed)
[place]  [phone]   HEMATOLOGY ONCOLOGY CONSULTATION   Cormick Moss  DOB: Nov 11, 1975  MR#: [id number]  CSN#: [id number]    Requesting Physician: [facility]   Primary MD: [facility]  History of present illness: Metastatic Cancer       HPI:  38 y.o. 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 He has experienced a 30 lbs weight loss in the process. He has noted increased abdominal girth. Patient denies any shortness of breath, chest pain, GI bleed, hematuria or lower extremity swelling. He denies heavy alcohol intake. Denies tobacco abuse. Denies risk factors for HIV or hepatitis. He denies any family history of GI malignancies.  CT of the abdomen and pelvis with contrast on 12/15 revealed several pulmonary nodules at the bases bilaterally, profound thickening of the distal esophagus extending beyond the gastroesophageal junction into the proximal stomach,cardia and fundus of the stomach, most evident along the lesser curvature.  No pathologic dilatation of small bowel or colon.  Extensive upper abdominal and retroperitoneal lymphadenopathy was visualized. Moderate volume of ascites, presumably malignant, was noted. Extensive soft tissue nodularity throughout the omentum, compatible with omental caking and widespread peritoneal metastasis. Additionally, there are other areas of enhancing nodularity along the peritoneal surface. Tiny ventral and umbilical hernias containing predominantly omental fat, including some omental implants.  There are no aggressive appearing lytic or blastic lesions noted in the visualized portions of the skeleton. No other staging CTs or tumor markers are available for review. We  have been kindly requested to see Mr. Dulworth with recommendations  Past medical history:   Squamous Cell Carcinoma of the scalp s/p excision, 08/2013  Seasonal allergies  Obesity  History of flu in April 2015  Past surgical history:      Past Surgical History  Procedure Laterality Date  . Squamous cell carcinoma excision  approx March 2015    removed from top of head    Medications:  Prior to Admission:  No prescriptions prior to admission   Scheduled Meds: . antiseptic oral rinse  7 mL Mouth Rinse q12n4p  . chlorhexidine  15 mL Mouth Rinse BID  . famotidine (PEPCID) IV  20 mg Intravenous Q12H  . heparin  5,000 Units Subcutaneous 3 times per day  . morphine  15 mg Oral Q12H   Continuous Infusions: . sodium chloride 125 mL/hr at 04/24/14 0546   PRN Meds:.hydrOXYzine, morphine injection  Allergies: No Known Allergies  Family history:     Family History  Problem Relation Age of Onset  . Breast cancer Sister   . Hypertension Mother   . Hypertension Father   Father has melanoma                            Social history: Single. Lives in [place], [place]. Works as a Development worker, international aid.  Never smoked. No alcohol or recreational drug use.   ROS: Constitutional: Denies fevers, chills or abnormal night sweats Eyes: Denies blurriness of vision, double vision or watery eyes Ears, nose, mouth, throat, and face: Denies mucositis or sore throat. Respiratory: Reports intermittent cough, and exertional dyspnea. No wheezes Cardiovascular: Denies palpitation, chest discomfort or lower extremity swelling Gastrointestinal: As per HPI. He has also noted increased  dysphagia, regurgitation, belching without significant increase in salivation Skin: Denies abnormal skin rashes Lymphatics: Denies new lymphadenopathy or easy bruising Neurological: Denies numbness, tingling or new weaknesses Behavioral/Psych: Mood is stable, no new changes  All other systems were reviewed with the patient and are  negative.   Family History:    Family History  Problem Relation Age of Onset  . Breast cancer Sister   . Hypertension Mother   . Hypertension Father    Melanoma Father   No family history of hematological  disorders.   Physical Exam    ECOG PERFORMANCE STATUS:1  Filed Vitals:   04/23/14 2136  BP: 138/85  Pulse: 87  Temp: 98.1 F (36.7 C)  Resp: 16   Filed Weights   04/23/14 2136  Weight: 288 lb 8 oz (130.863 kg)    GENERAL:alert, no distress and comfortable SKIN: skin color, texture, turgor are normal, no rashes or significant lesions. Well healed area of excision in scalp without new lesions seen. EYES: normal, conjunctiva are pink and non-injected, sclera clear OROPHARYNX:no exudate, no erythema and lips, buccal mucosa, and ?geographic tongue. NECK: supple, thyroid normal size, non-tender, without nodularity LYMPH:  no palpable lymphadenopathy in the cervical, axillary. Questionable Lymph node palpable in the right inguinal area LUNGS: clear to auscultation and percussion with normal breathing effort HEART: regular rate & rhythm and no murmurs and no lower extremity edema ABDOMEN obese, distended, Fluid wave noted on the right.  That is tenderness to palpation just below the umbilicus area to the left. There is also tenderness at the left upper quadrant without discreet masses. Musculoskeletal:no cyanosis of digits and no clubbing  PSYCH: alert & oriented x 3 with fluent speech NEURO: no focal motor/sensory deficits   Lab results:       CBC  Recent Labs Lab 04/23/14 1624 04/24/14 0845  WBC 11.1* 9.2  HGB 12.5* 11.3*  HCT 38.6* 36.4*  PLT 579* 539*  MCV 75.5* 76.5*  MCH 24.5* 23.7*  MCHC 32.4 31.0  RDW 14.5 14.6  LYMPHSABS 1.7 1.4  MONOABS 1.3* 1.0  EOSABS 0.0 0.1  BASOSABS 0.0 0.0    Anemia panel:  No results for input(s): VITAMINB12, FOLATE, FERRITIN, TIBC, IRON, RETICCTPCT in the last 72 hours.   Chemistries   Recent Labs Lab  04/23/14 1624 04/24/14 0845  NA 135* 134*  K 4.1 4.3  CL 95* 96  CO2 23 26  GLUCOSE 113* 91  BUN 18 17  CREATININE 0.97 0.99  CALCIUM 9.1 8.7  MG  --  2.2     Coagulation profile  Recent Labs Lab 04/23/14 2305  INR 1.18    Urine Studies No results for input(s): UHGB, CRYS in the last 72 hours.  Invalid input(s): UACOL, UAPR, USPG, UPH, UTP, UGL, UKET, UBIL, UNIT, UROB, ULEU, UEPI, UWBC, URBC, UBAC, CAST, UCOM, BILUA  Studies:      Ct Abdomen Pelvis W Contrast  04/23/2014   CLINICAL DATA:  38 year old male with epigastric pain, vomiting and dysphagia for the past several months.  EXAM: CT ABDOMEN AND PELVIS WITH CONTRAST  TECHNIQUE: Multidetector CT imaging of the abdomen and pelvis was performed using the standard protocol following bolus administration of intravenous contrast.  CONTRAST:  128mL OMNIPAQUE IOHEXOL 300 MG/ML  SOLN  COMPARISON:  No priors.  FINDINGS: Lower chest: Several pulmonary nodules are noted in the visualized lung bases bilaterally, the largest of which measures up to 1 cm in the right lower lobe (image 12 of series 3). Profound thickening  of the distal esophagus extending beyond the gastroesophageal junction into the proximal stomach.  Hepatobiliary: No cystic or solid hepatic lesions. No intra or extrahepatic biliary ductal dilatation. Gallbladder is remarkable for some amorphous increased attenuation dependently, likely to reflect some biliary sludge.  Pancreas: Fatty atrophy in the pancreas.  Otherwise, unremarkable.  Spleen: Unremarkable.  Adrenals/Urinary Tract: Bilateral adrenal glands and the right kidney are unremarkable in appearance. Exophytic 2.4 cm low-attenuation lesion in the anterior aspect of the upper pole of the left kidney is compatible with a simple cyst. No hydroureteronephrosis. Urinary bladder is normal in appearance.  Stomach/Bowel: Marked thickening of the gastric wall at the gastroesophageal junction, extending into the cardia and fundus  of the stomach, most evident along the lesser curvature. This is contiguous with the previously mentioned esophageal wall thickening. No pathologic dilatation of small bowel or colon.  Vascular/Lymphatic: Extensive upper abdominal lymphadenopathy, predominantly in the gastrohepatic ligament and celiac axis nodal stations, with the largest single lymph node measuring up to 3.7 x 3.0 cm in the celiac axis nodal station (image 27 of series 2). Portacaval lymphadenopathy measuring up to 1.7 cm. Notably, many of the enlarged celiac axis lymph nodes appear partially calcified, which could suggest a mucinous subtype of neoplasm. Enlarged left paraaortic retroperitoneal lymph nodes measuring up to 2.4 x 4.2 cm adjacent to the left renal vein (image 33 of series 2). No significant atherosclerotic disease in the abdominal or pelvic vasculature.  Reproductive: Prostate gland and seminal vesicles are unremarkable in appearance.  Other: Moderate volume of ascites, presumably malignant. Extensive soft tissue nodularity throughout the omentum, compatible with omental caking and widespread peritoneal metastasis. Additionally, there are other areas of enhancing nodularity along the peritoneal surface. Tiny ventral and umbilical hernias containing predominantly omental fat, including some omental implants. No pneumoperitoneum.  Musculoskeletal: There are no aggressive appearing lytic or blastic lesions noted in the visualized portions of the skeleton.  IMPRESSION: 1. Findings, as above, highly concerning for either esophageal or gastric primary malignancy, with metastatic lymphadenopathy in the upper abdomen and retroperitoneum, widespread intraperitoneal spread of disease, malignant ascites, and multiple pulmonary nodules in the visualize lung bases concerning for pulmonary metastasis. Oncologic evaluation is recommended. 2. Additional incidental findings, as above. These results were called by telephone at the time of  interpretation on 04/23/2014 at 7:53 pm to [place] , who verbally acknowledged these results.   Electronically Signed   By: Vinnie Langton M.D.   On: 04/23/2014 19:56    Assessment/Plan:38 y.o. male   Metastatic Carcinoma History of Squamous Cell Carcinoma of the scalp 08/2013 s/p excision.  Mr. Millstein was admitted with progressive, 4 month history of abdominal pain, dysphagia 30 pound weight loss during that period He was admitted for further evaluation. CT of the abdomen and pelvis demonstrated Pulmonary nodules,esophageal  and proximal gastric thickening, along with omental implants and ascites as well as abdominal and retroperitoneal adenopathy concerning for malignancy. Further workup is recommended.  Consider GI evaluation for upper endoscopy, as GI primary is suspected. Once results become available, further recommendations and treatment options are to be discussed.  In the interim, consider CT of the chest and CT of the head to rule out occult metastatic disease. In addition, therapeutic/diagnostic paracentesis may be needed to improve patient's symptoms. Consider obtaining medical records from his dermatologist at [facility] for details of the diagnosis of squamous cell carcinoma of the scalp.  Anemia, microcytic This is likely secondary to malnutrition, malignancy. Consider obtaining iron panel and while  in the hospital  Thrombocytosis Likely reactive, in the setting of anemia, dehydration due to vomiting and malignancy Continue to monitor  Leukocytosis The patient had elevated white count on admission, now result. This is likely reactive leukocytosis in the setting of inflammation, pain and malignancy  Malnutrition Patient has lost about 30 lbs in the last 4 months due to malignancy Appreciate nutrition and involvement and consultation  Nausea / vomiting/abdominal pain Secondary to malignancy Continue anti-emetics, antacids, as per primary team  DVT prophylaxis On  heparin and subcutaneous 3 times a day  Full code  Thank you for allowing [place] the opportunity to participate in the care of this nice patient.   [place], PA-C 04/24/2014   Addendum: I have seen the patient, examined him. I agree with the assessment and and plan and have edited the notes.   38 years old Caucasian male without significant past medical history, presents with worsening abdominal pain, nausea and vomiting, mild dysphagia with solid food, and weight loss. His CT of abdomen showed multiple pulmonary nodules, distal esophageal and gastric wall thickening, and a high suspicion for peritoneal carcinomatosis.  His clinical presentation and CT findings are highly suspicious for gastric or distal esophageal cancer, and agrees with the EGD evaluation and possible biopsy, which is scheduled for tomorrow morning by Dr. Ardis Hughs. If the EGD is negative please consider colonoscopy, although low GI malignancy is less likely.   I would also obtain ultrasound guided paracentesis, for cytology to ruled out malignant ascites. If a paracentesis is not feasible, I will obtain a PET CT scan as outpatient. If ascites cytology returns positive for malignant cells, I'll then obtain a CT chest as outpatient instead of a PET scan, to complete staging.  I discussed the high likelihood of metastatic cancer, I recommend him to have a port placement during his hospital stay.   I would also consider checking tumor markers including CA 19.9 and CEA.  I'll follow with him on Friday if I have the initial biopsy results back, and then I'll see him in my clinic for treatment.  Patient and his multiple family members had many questions, I answered to their satisfaction.  Truitt Merle 04/24/2014

## 2014-04-24 NOTE — Progress Notes (Signed)
[facility] PROGRESS NOTE  Samuel Durham VOH:[id number] DOB: 17-Jan-1976 DOA: 04/23/2014 PCP: No primary care provider on file.  Assessment/Plan: #1. Probable Metatastic Cancer Likely metatstatic GI tumor per CT abd/Pelvis. Patient denies any nausea, or emesis. Patient asking for food. Place on clear liquids.Will consult with GI and oncology for further evaluation and rxcs. Supportive care.  #2 abdominal pain Secondary to problem #1.  #3 prolonged QTc interval Repeat EKG with resolution of QTC prolongation. Follow.  #4 prophylaxis Pepcid for GI prophylaxis. Heparin for DVT prophylaxis.  Code Status: Full Family Communication: Updated patient and sisters at bedside. Disposition Plan: Remain in pateint.   Consultants:  None  Procedures:  CT abd/pelvis 04/23/14  Antibiotics:  None  HPI/Subjective: Patient states nausea improved. No emesis. No abdominal pain.  Objective: Filed Vitals:   04/24/14 1430  BP: 139/84  Pulse: 84  Temp: 98.6 F (37 C)  Resp: 18    Intake/Output Summary (Last 24 hours) at 04/24/14 2013 Last data filed at 04/24/14 1757  Gross per 24 hour  Intake 2519.6 ml  Output    200 ml  Net 2319.6 ml   Filed Weights   04/23/14 2136 04/24/14 0530  Weight: 130.863 kg (288 lb 8 oz) 130.999 kg (288 lb 12.8 oz)    Exam:   General:  NAD  Cardiovascular: RRR  Respiratory: CTAB  Abdomen: Soft/NT/distended/+BS  Musculoskeletal: No c/c/e  Data Reviewed: Basic Metabolic Panel:  Recent Labs Lab 04/23/14 1624 04/24/14 0845  NA 135* 134*  K 4.1 4.3  CL 95* 96  CO2 23 26  GLUCOSE 113* 91  BUN 18 17  CREATININE 0.97 0.99  CALCIUM 9.1 8.7  MG  --  2.2   Liver Function Tests:  Recent Labs Lab 04/23/14 1624 04/24/14 0845  AST 22 16  ALT 14 11  ALKPHOS 101 87  BILITOT 0.5 0.4  PROT 7.4 6.4  ALBUMIN 2.8* 2.4*    Recent Labs Lab 04/23/14 1624  LIPASE 25   No results for input(s): AMMONIA in the last 168  hours. CBC:  Recent Labs Lab 04/23/14 1624 04/24/14 0845  WBC 11.1* 9.2  NEUTROABS 8.0* 6.7  HGB 12.5* 11.3*  HCT 38.6* 36.4*  MCV 75.5* 76.5*  PLT 579* 539*   Cardiac Enzymes: No results for input(s): CKTOTAL, CKMB, CKMBINDEX, TROPONINI in the last 168 hours. BNP (last 3 results) No results for input(s): PROBNP in the last 8760 hours. CBG:  Recent Labs Lab 04/24/14 0812  GLUCAP 95    No results found for this or any previous visit (from the past 240 hour(s)).   Studies: Ct Abdomen Pelvis W Contrast  04/23/2014   CLINICAL DATA:  38 year old male with epigastric pain, vomiting and dysphagia for the past several months.  EXAM: CT ABDOMEN AND PELVIS WITH CONTRAST  TECHNIQUE: Multidetector CT imaging of the abdomen and pelvis was performed using the standard protocol following bolus administration of intravenous contrast.  CONTRAST:  143mL OMNIPAQUE IOHEXOL 300 MG/ML  SOLN  COMPARISON:  No priors.  FINDINGS: Lower chest: Several pulmonary nodules are noted in the visualized lung bases bilaterally, the largest of which measures up to 1 cm in the right lower lobe (image 12 of series 3). Profound thickening of the distal esophagus extending beyond the gastroesophageal junction into the proximal stomach.  Hepatobiliary: No cystic or solid hepatic lesions. No intra or extrahepatic biliary ductal dilatation. Gallbladder is remarkable for some amorphous increased attenuation dependently, likely to reflect some biliary sludge.  Pancreas: Fatty atrophy in  the pancreas.  Otherwise, unremarkable.  Spleen: Unremarkable.  Adrenals/Urinary Tract: Bilateral adrenal glands and the right kidney are unremarkable in appearance. Exophytic 2.4 cm low-attenuation lesion in the anterior aspect of the upper pole of the left kidney is compatible with a simple cyst. No hydroureteronephrosis. Urinary bladder is normal in appearance.  Stomach/Bowel: Marked thickening of the gastric wall at the gastroesophageal  junction, extending into the cardia and fundus of the stomach, most evident along the lesser curvature. This is contiguous with the previously mentioned esophageal wall thickening. No pathologic dilatation of small bowel or colon.  Vascular/Lymphatic: Extensive upper abdominal lymphadenopathy, predominantly in the gastrohepatic ligament and celiac axis nodal stations, with the largest single lymph node measuring up to 3.7 x 3.0 cm in the celiac axis nodal station (image 27 of series 2). Portacaval lymphadenopathy measuring up to 1.7 cm. Notably, many of the enlarged celiac axis lymph nodes appear partially calcified, which could suggest a mucinous subtype of neoplasm. Enlarged left paraaortic retroperitoneal lymph nodes measuring up to 2.4 x 4.2 cm adjacent to the left renal vein (image 33 of series 2). No significant atherosclerotic disease in the abdominal or pelvic vasculature.  Reproductive: Prostate gland and seminal vesicles are unremarkable in appearance.  Other: Moderate volume of ascites, presumably malignant. Extensive soft tissue nodularity throughout the omentum, compatible with omental caking and widespread peritoneal metastasis. Additionally, there are other areas of enhancing nodularity along the peritoneal surface. Tiny ventral and umbilical hernias containing predominantly omental fat, including some omental implants. No pneumoperitoneum.  Musculoskeletal: There are no aggressive appearing lytic or blastic lesions noted in the visualized portions of the skeleton.  IMPRESSION: 1. Findings, as above, highly concerning for either esophageal or gastric primary malignancy, with metastatic lymphadenopathy in the upper abdomen and retroperitoneum, widespread intraperitoneal spread of disease, malignant ascites, and multiple pulmonary nodules in the visualize lung bases concerning for pulmonary metastasis. Oncologic evaluation is recommended. 2. Additional incidental findings, as above. These results were  called by telephone at the time of interpretation on 04/23/2014 at 7:53 pm to [place] , who verbally acknowledged these results.   Electronically Signed   By: Vinnie Langton M.D.   On: 04/23/2014 19:56    Scheduled Meds: . antiseptic oral rinse  7 mL Mouth Rinse q12n4p  . chlorhexidine  15 mL Mouth Rinse BID  . docusate sodium  100 mg Oral BID  . famotidine (PEPCID) IV  20 mg Intravenous Q12H  . heparin  5,000 Units Subcutaneous 3 times per day  . morphine  15 mg Oral Q12H   Continuous Infusions: . sodium chloride 125 mL/hr at 04/24/14 1451    Principal Problem:   Metastatic cancer Active Problems:   Abdominal pain   QT prolongation   Protein-calorie malnutrition, severe    Time spent: 35 mins    [facility] MD [facility] Pager [phone]. If 7PM-7AM, please contact night-coverage at [email or web address], password [email or web address] 04/24/2014, 8:13 PM  LOS: 1 day

## 2014-04-24 NOTE — Consult Note (Signed)
Consultation  Referring Provider: [facility]     Primary Care Physician:  No primary care provider on file. Primary Gastroenterologist: none        Reason for Consultation:  Nausea / vomiting/ weight loss / abnormal CTscan            HPI:   Samuel Durham is a 38 y.o. male who presented to ED with a several month history of progressive upper abdominal pain, nausea /vomiting and weight loss. 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 No Jansen of gastric cancer. Patient here with sister, parents on the way from [place].   Past Medical History  Morbid obesity Squamous cell cancer of head Date    Past Surgical History  Procedure Laterality Date  . Squamous cell carcinoma excision  approx March 2015    removed from top of head    Family History  Problem Relation Age of Onset  . Breast cancer Sister   . Hypertension Mother   . Hypertension Father      History  Substance Use Topics  . Smoking status: Never Smoker   . Smokeless tobacco: Never Used  . Alcohol Use: No    Prior to Admission medications   Not on File    Current Facility-Administered Medications  Medication Dose Route Frequency Provider Last Rate Last Dose  . 0.9 %  sodium chloride infusion   Intravenous Continuous Ivor Costa, MD 125 mL/hr at 04/24/14 0546    . antiseptic oral rinse (CPC / CETYLPYRIDINIUM CHLORIDE 0.05%) solution 7 mL  7 mL Mouth Rinse q12n4p Ivor Costa, MD   7 mL at 04/24/14 1253  . chlorhexidine (PERIDEX) 0.12 % solution 15 mL  15 mL Mouth Rinse BID Ivor Costa, MD   15 mL at 04/24/14 1029  . docusate sodium (COLACE) capsule 100 mg  100 mg Oral BID Eugenie Filler, MD   100 mg at 04/24/14 1253  . famotidine (PEPCID) IVPB 20 mg  20 mg Intravenous Q12H Ivor Costa, MD   20 mg at 04/24/14 1029  . heparin injection 5,000 Units  5,000 Units Subcutaneous 3 times per day Ivor Costa, MD   5,000 Units at 04/24/14 1253  . hydrOXYzine (VISTARIL) injection 25 mg  25 mg Intramuscular Q6H  PRN Ivor Costa, MD      . morphine (MS CONTIN) 12 hr tablet 15 mg  15 mg Oral Q12H Ivor Costa, MD   15 mg at 04/24/14 1029  . morphine 2 MG/ML injection 2 mg  2 mg Intravenous Q4H PRN Ivor Costa, MD   2 mg at 04/24/14 1253    Allergies as of 04/23/2014  . (No Known Allergies)    Review of Systems:    All systems reviewed and negative except where noted in HPI.    Physical Exam:  Vital signs in last 24 hours: Temp:  [98.1 F (36.7 C)-98.4 F (36.9 C)] 98.3 F (36.8 C) (12/16 0530) Pulse Rate:  [70-103] 70 (12/16 0530) Resp:  [15-20] 15 (12/16 0530) BP: (124-143)/(74-92) 136/82 mmHg (12/16 0530) SpO2:  [95 %-99 %] 99 % (12/16 0530) Weight:  [288 lb 8 oz (130.863 kg)-288 lb 12.8 oz (130.999 kg)] 288 lb 12.8 oz (130.999 kg) (12/16 0530) Last BM Date: 04/23/14 General:   Pleasant white male in NAD Head:  Normocephalic and atraumatic. Eyes:   No icterus.   Conjunctiva pink. Ears:  Normal auditory acuity. Neck:  Supple; no masses felt Lungs:  Respirations even and unlabored. Lungs clear to  auscultation bilaterally.     Heart:  Regular rate and rhythm Abdomen:  Soft, obese,mild LUQ tenderness.   Rectal:  Not performed.  Msk:  Symmetrical without gross deformities.  Extremities:  Without edema. Neurologic:  Alert and  oriented x4;  grossly normal neurologically. Skin:  Intact without significant lesions or rashes. Cervical Nodes:  No significant cervical adenopathy. Psych:  Alert and cooperative. Normal affect.  LAB RESULTS:  Recent Labs  04/23/14 1624 04/24/14 0845  WBC 11.1* 9.2  HGB 12.5* 11.3*  HCT 38.6* 36.4*  PLT 579* 539*   BMET  Recent Labs  04/23/14 1624 04/24/14 0845  NA 135* 134*  K 4.1 4.3  CL 95* 96  CO2 23 26  GLUCOSE 113* 91  BUN 18 17  CREATININE 0.97 0.99  CALCIUM 9.1 8.7   LFT  Recent Labs  04/24/14 0845  PROT 6.4  ALBUMIN 2.4*  AST 16  ALT 11  ALKPHOS 87  BILITOT 0.4   PT/INR  Recent Labs  04/23/14 2305  LABPROT 15.2  INR  1.18    STUDIES: Ct Abdomen Pelvis W Contrast  04/23/2014   CLINICAL DATA:  38 year old male with epigastric pain, vomiting and dysphagia for the past several months.  EXAM: CT ABDOMEN AND PELVIS WITH CONTRAST  TECHNIQUE: Multidetector CT imaging of the abdomen and pelvis was performed using the standard protocol following bolus administration of intravenous contrast.  CONTRAST:  166m OMNIPAQUE IOHEXOL 300 MG/ML  SOLN  COMPARISON:  No priors.  FINDINGS: Lower chest: Several pulmonary nodules are noted in the visualized lung bases bilaterally, the largest of which measures up to 1 cm in the right lower lobe (image 12 of series 3). Profound thickening of the distal esophagus extending beyond the gastroesophageal junction into the proximal stomach.  Hepatobiliary: No cystic or solid hepatic lesions. No intra or extrahepatic biliary ductal dilatation. Gallbladder is remarkable for some amorphous increased attenuation dependently, likely to reflect some biliary sludge.  Pancreas: Fatty atrophy in the pancreas.  Otherwise, unremarkable.  Spleen: Unremarkable.  Adrenals/Urinary Tract: Bilateral adrenal glands and the right kidney are unremarkable in appearance. Exophytic 2.4 cm low-attenuation lesion in the anterior aspect of the upper pole of the left kidney is compatible with a simple cyst. No hydroureteronephrosis. Urinary bladder is normal in appearance.  Stomach/Bowel: Marked thickening of the gastric wall at the gastroesophageal junction, extending into the cardia and fundus of the stomach, most evident along the lesser curvature. This is contiguous with the previously mentioned esophageal wall thickening. No pathologic dilatation of small bowel or colon.  Vascular/Lymphatic: Extensive upper abdominal lymphadenopathy, predominantly in the gastrohepatic ligament and celiac axis nodal stations, with the largest single lymph node measuring up to 3.7 x 3.0 cm in the celiac axis nodal station (image 27 of series  2). Portacaval lymphadenopathy measuring up to 1.7 cm. Notably, many of the enlarged celiac axis lymph nodes appear partially calcified, which could suggest a mucinous subtype of neoplasm. Enlarged left paraaortic retroperitoneal lymph nodes measuring up to 2.4 x 4.2 cm adjacent to the left renal vein (image 33 of series 2). No significant atherosclerotic disease in the abdominal or pelvic vasculature.  Reproductive: Prostate gland and seminal vesicles are unremarkable in appearance.  Other: Moderate volume of ascites, presumably malignant. Extensive soft tissue nodularity throughout the omentum, compatible with omental caking and widespread peritoneal metastasis. Additionally, there are other areas of enhancing nodularity along the peritoneal surface. Tiny ventral and umbilical hernias containing predominantly omental fat, including some omental implants. No  pneumoperitoneum.  Musculoskeletal: There are no aggressive appearing lytic or blastic lesions noted in the visualized portions of the skeleton.  IMPRESSION: 1. Findings, as above, highly concerning for either esophageal or gastric primary malignancy, with metastatic lymphadenopathy in the upper abdomen and retroperitoneum, widespread intraperitoneal spread of disease, malignant ascites, and multiple pulmonary nodules in the visualize lung bases concerning for pulmonary metastasis. Oncologic evaluation is recommended. 2. Additional incidental findings, as above. These results were called by telephone at the time of interpretation on 04/23/2014 at 7:53 pm to [place] , who verbally acknowledged these results.   Electronically Signed   By: Vinnie Langton M.D.   On: 04/23/2014 19:56    PREVIOUS ENDOSCOPIES:            none   Impression / Plan:   50. 38 year old male with several month history of progressive abdominal pain, nausea, vomiting, weight loss. CTscan reveals widely metastatic disease with possible gastric primary. Oncology has already  evaluated. We will plan for EGD with biopsies to be done in am.   2. Mild microcytic anemia, possibly secondary to chronic GI blood loss from a gastric cancer.   3. Hypoalbuminemia, likely malnutrition in setting of nausea / vomiting and weight loss.  Thanks   LOS: 1 day   Tye Savoy  04/24/2014, 1:37 PM    ________________________________________________________________________  Velora Heckler GI MD note:  I personally examined the patient, reviewed the data and agree with the assessment and plan described above.  I met with pt, several family members.  Planning on EGD tomorrow.  Will likely be late morning.     Owens Loffler, MD [facility] Pager [phone]

## 2014-04-25 ENCOUNTER — Encounter (HOSPITAL_COMMUNITY)
Admission: EM | Disposition: A | Discharge status: Home / Self Care | Payer: Self-pay | Source: Home / Self Care | Attending: Internal Medicine

## 2014-04-25 ENCOUNTER — Encounter (HOSPITAL_COMMUNITY): Payer: Self-pay

## 2014-04-25 ENCOUNTER — Inpatient Hospital Stay (HOSPITAL_COMMUNITY): Payer: Medicaid Other | Admitting: Anesthesiology

## 2014-04-25 DIAGNOSIS — C169 Malignant neoplasm of stomach, unspecified: Secondary | ICD-10-CM | POA: Diagnosis present

## 2014-04-25 DIAGNOSIS — R188 Other ascites: Secondary | ICD-10-CM

## 2014-04-25 DIAGNOSIS — E43 Unspecified severe protein-calorie malnutrition: Secondary | ICD-10-CM

## 2014-04-25 DIAGNOSIS — R1084 Generalized abdominal pain: Secondary | ICD-10-CM | POA: Diagnosis present

## 2014-04-25 DIAGNOSIS — R180 Malignant ascites: Secondary | ICD-10-CM | POA: Diagnosis present

## 2014-04-25 HISTORY — PX: ESOPHAGOGASTRODUODENOSCOPY (EGD) WITH PROPOFOL: SHX5813

## 2014-04-25 LAB — COMPREHENSIVE METABOLIC PANEL
ALT: 10 U/L (ref 0–53)
AST: 17 U/L (ref 0–37)
Albumin: 2.3 g/dL — ABNORMAL LOW (ref 3.5–5.2)
Alkaline Phosphatase: 82 U/L (ref 39–117)
Anion gap: 12 (ref 5–15)
BILIRUBIN TOTAL: 0.5 mg/dL (ref 0.3–1.2)
BUN: 15 mg/dL (ref 6–23)
CHLORIDE: 99 meq/L (ref 96–112)
CO2: 26 mEq/L (ref 19–32)
CREATININE: 0.98 mg/dL (ref 0.50–1.35)
Calcium: 8.7 mg/dL (ref 8.4–10.5)
GFR calc Af Amer: >90 mL/min (ref >90)
Glucose, Bld: 88 mg/dL (ref 70–99)
Potassium: 4.2 mEq/L (ref 3.7–5.3)
Sodium: 137 mEq/L (ref 137–147)
Total Protein: 6.3 g/dL (ref 6.0–8.3)

## 2014-04-25 LAB — CBC WITH DIFFERENTIAL/PLATELET
BASOS ABS: 0 10*3/uL (ref 0.0–0.1)
Basophils Relative: 0 % (ref 0–1)
Eosinophils Absolute: 0.1 10*3/uL (ref 0.0–0.7)
Eosinophils Relative: 1 % (ref 0–5)
HCT: 37.9 % — ABNORMAL LOW (ref 39.0–52.0)
Hemoglobin: 11.4 g/dL — ABNORMAL LOW (ref 13.0–17.0)
LYMPHS PCT: 16 % (ref 12–46)
Lymphs Abs: 1.4 10*3/uL (ref 0.7–4.0)
MCH: 23.7 pg — ABNORMAL LOW (ref 26.0–34.0)
MCHC: 30.1 g/dL (ref 30.0–36.0)
MCV: 78.6 fL (ref 78.0–100.0)
Monocytes Absolute: 1.4 10*3/uL — ABNORMAL HIGH (ref 0.1–1.0)
Monocytes Relative: 15 % — ABNORMAL HIGH (ref 3–12)
NEUTROS PCT: 68 % (ref 43–77)
Neutro Abs: 6.2 10*3/uL (ref 1.7–7.7)
PLATELETS: 632 10*3/uL — AB (ref 150–400)
RBC: 4.82 MIL/uL (ref 4.22–5.81)
RDW: 15 % (ref 11.5–15.5)
WBC: 9.2 10*3/uL (ref 4.0–10.5)

## 2014-04-25 LAB — GLUCOSE, CAPILLARY: GLUCOSE-CAPILLARY: 79 mg/dL (ref 70–99)

## 2014-04-25 LAB — MAGNESIUM: MAGNESIUM: 2.3 mg/dL (ref 1.5–2.5)

## 2014-04-25 SURGERY — ESOPHAGOGASTRODUODENOSCOPY (EGD) WITH PROPOFOL
Anesthesia: Monitor Anesthesia Care

## 2014-04-25 MED ORDER — MIDAZOLAM HCL 2 MG/2ML IJ SOLN
INTRAMUSCULAR | Status: AC
  Filled 2014-04-25: qty 2

## 2014-04-25 MED ORDER — KETAMINE HCL 10 MG/ML IJ SOLN
INTRAMUSCULAR | Status: DC | PRN
  Administered 2014-04-25: 10 mg via INTRAVENOUS

## 2014-04-25 MED ORDER — MORPHINE SULFATE 15 MG PO TABS
15.0000 mg | ORAL_TABLET | ORAL | Status: DC | PRN
  Administered 2014-04-25 – 2014-04-27 (×7): 15 mg via ORAL
  Filled 2014-04-25 (×7): qty 1

## 2014-04-25 MED ORDER — PROPOFOL INFUSION 10 MG/ML OPTIME
INTRAVENOUS | Status: DC | PRN
  Administered 2014-04-25: 25 ug/kg/min via INTRAVENOUS

## 2014-04-25 MED ORDER — LIDOCAINE HCL (CARDIAC) 20 MG/ML IV SOLN
INTRAVENOUS | Status: AC
  Filled 2014-04-25: qty 5

## 2014-04-25 MED ORDER — FENTANYL CITRATE 0.05 MG/ML IJ SOLN
INTRAMUSCULAR | Status: AC
  Filled 2014-04-25: qty 2

## 2014-04-25 MED ORDER — PROPOFOL 10 MG/ML IV BOLUS
INTRAVENOUS | Status: AC
  Filled 2014-04-25: qty 20

## 2014-04-25 MED ORDER — FENTANYL CITRATE 0.05 MG/ML IJ SOLN
INTRAMUSCULAR | Status: DC | PRN
  Administered 2014-04-25 (×2): 25 ug via INTRAVENOUS

## 2014-04-25 MED ORDER — LACTATED RINGERS IV SOLN
INTRAVENOUS | Status: DC | PRN
  Administered 2014-04-25: 10:00:00 via INTRAVENOUS

## 2014-04-25 MED ORDER — METOCLOPRAMIDE HCL 5 MG/ML IJ SOLN
INTRAMUSCULAR | Status: DC | PRN
  Administered 2014-04-25: 10 mg via INTRAVENOUS

## 2014-04-25 MED ORDER — TRAMADOL HCL 50 MG PO TABS
50.0000 mg | ORAL_TABLET | Freq: Four times a day (QID) | ORAL | Status: DC | PRN
  Administered 2014-04-26: 100 mg via ORAL
  Filled 2014-04-25: qty 2

## 2014-04-25 MED ORDER — ONDANSETRON HCL 4 MG/2ML IJ SOLN
INTRAMUSCULAR | Status: DC | PRN
  Administered 2014-04-25: 4 mg via INTRAVENOUS

## 2014-04-25 MED ORDER — MIDAZOLAM HCL 5 MG/5ML IJ SOLN
INTRAMUSCULAR | Status: DC | PRN
  Administered 2014-04-25: 2 mg via INTRAVENOUS

## 2014-04-25 MED ORDER — LIDOCAINE HCL 1 % IJ SOLN
INTRAMUSCULAR | Status: DC | PRN
  Administered 2014-04-25: 50 mg via INTRADERMAL

## 2014-04-25 MED ORDER — LACTATED RINGERS IV SOLN
INTRAVENOUS | Status: DC
  Administered 2014-04-25: 1000 mL via INTRAVENOUS

## 2014-04-25 SURGICAL SUPPLY — 14 items

## 2014-04-25 NOTE — Op Note (Signed)
[facility] [place], [address]   ENDOSCOPY PROCEDURE REPORT  PATIENT: Samuel Durham, Samuel Durham  MR#: [id number] BIRTHDATE: 12/12/1975 , 38  yrs. old GENDER: male ENDOSCOPIST: Milus Banister, MD PROCEDURE DATE:  04/25/2014 PROCEDURE:  EGD w/ biopsy ASA CLASS:     Class II INDICATIONS:  abdominal pain, CT scan suggests widely metastatic process (lung, peritoneum) with gastric/esophageal thickening. MEDICATIONS: Monitored anesthesia care TOPICAL ANESTHETIC: none  DESCRIPTION OF PROCEDURE: After the risks benefits and alternatives of the procedure were thoroughly explained, informed consent was obtained.  The [id number] ([id number]) endoscope was introduced through the mouth and advanced to the second portion of the duodenum , Without limitations.  The instrument was slowly withdrawn as the mucosa was fully examined.  There was a large, nearly circumferential, ulcerated, clearly malignant mass that stradles the GE junction.  The vast bulk of the mass lays in the stomach, occupying about 1/3 of the stomach.  The mass extends into the esophagus for about 4cm above the GE junction.  Mutiple biopsies were taken from the gastric portion of the mass.  Retroflexed views revealed no abnormalities.     The scope was then withdrawn from the patient and the procedure completed.  COMPLICATIONS: There were no immediate complications.  ENDOSCOPIC IMPRESSION: There was a large, nearly circumferential, ulcerated, clearly malignant mass that stradles the GE junction.  The vast bulk of the mass lays in the stomach, occupying about 1/3 of the stomach.  The mass extends into the esophagus for about 4cm above the GE junction.  Mutiple biopsies were taken from the gastric portion of the mass  RECOMMENDATIONS: Await final pathology.   eSigned:  Milus Banister, MD 04/25/2014 11:11 AM    CC: Truitt Merle, MD

## 2014-04-25 NOTE — Progress Notes (Signed)
CRITICAL VALUE ALERT  Critical value received:  9381  Date of notification:  12/18  0755  Critical value read back:yes  Nurse who received alert:  Cleopatra Cedar  MD notified (1st page):  Dr. Irine Seal  Time of first page:  0805 MD notified (2nd page):  Time of second page:  Responding MD:  Dr. Grandville Silos  Time MD responded:  [phone]

## 2014-04-25 NOTE — Progress Notes (Signed)
[facility] PROGRESS NOTE  Samuel Durham [name] DOB: 20-Jun-1975 DOA: 04/23/2014 PCP: No primary care provider on file.  Assessment/Plan: #1. 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 Patient denies any nausea, or emesis. Patient currently tolerating current diet. Patient is status post upper endoscopy which showed a large nearly circumferential ulcerated clearly malignant mass that she struggles the GE junction. Has bulk of the mass place in the stomach occupying the third of the stomach area mass extends into the esophagus for about 4 cm above the GE junction. Biopsies pending. Continue current diet. GI and oncology following and appreciate day per recommendations.   #2 ascites Likely secondary to problems #1 and 3. Will ask interventional radiology to do a diagnostic and therapeutic paracentesis.  #3 abdominal pain Secondary to problem #1.  #4 prolonged QTc interval Repeat EKG with resolution of QTC prolongation. Follow.  #5 prophylaxis Pepcid for GI prophylaxis. Heparin for DVT prophylaxis.  Code Status: Full Family Communication: Updated patient and sister and parents at bedside. Disposition Plan: Remain in pateint.   Consultants:  Gastroenterology: Dr. Ardis Hughs 04/24/2014  Oncology: Dr. Burr Medico 04/24/2014  Procedures:  CT abd/pelvis 04/23/14  Upper endoscopy 04/25/2014 Dr. Ardis Hughs  Antibiotics:  None  HPI/Subjective: Patient states nausea improved. No emesis. No abdominal pain. Patient tolerating current diet.  Objective: Filed Vitals:   04/25/14 1120  BP: 156/79  Pulse: 82  Temp:   Resp: 12    Intake/Output Summary (Last 24 hours) at 04/25/14 1702 Last data filed at 04/25/14 1112  Gross per 24 hour  Intake 2621.9 ml  Output    200 ml  Net 2421.9 ml   Filed Weights   04/23/14 2136 04/24/14 0530 04/25/14 0654  Weight: 130.863 kg (288 lb 8 oz) 130.999 kg (288 lb  12.8 oz) 132.9 kg (292 lb 15.9 oz)    Exam:   General:  NAD  Cardiovascular: RRR  Respiratory: CTAB  Abdomen: Soft/NT/distended/+BS  Musculoskeletal: No c/c/e  Data Reviewed: Basic Metabolic Panel:  Recent Labs Lab 04/23/14 1624 04/24/14 0845 04/25/14 0434  NA 135* 134* 137  K 4.1 4.3 4.2  CL 95* 96 99  CO2 23 26 26   GLUCOSE 113* 91 88  BUN 18 17 15   CREATININE 0.97 0.99 0.98  CALCIUM 9.1 8.7 8.7  MG  --  2.2 2.3   Liver Function Tests:  Recent Labs Lab 04/23/14 1624 04/24/14 0845 04/25/14 0434  AST 22 16 17   ALT 14 11 10   ALKPHOS 101 87 82  BILITOT 0.5 0.4 0.5  PROT 7.4 6.4 6.3  ALBUMIN 2.8* 2.4* 2.3*    Recent Labs Lab 04/23/14 1624  LIPASE 25   No results for input(s): AMMONIA in the last 168 hours. CBC:  Recent Labs Lab 04/23/14 1624 04/24/14 0845 04/25/14 0434  WBC 11.1* 9.2 9.2  NEUTROABS 8.0* 6.7 6.2  HGB 12.5* 11.3* 11.4*  HCT 38.6* 36.4* 37.9*  MCV 75.5* 76.5* 78.6  PLT 579* 539* 632*   Cardiac Enzymes: No results for input(s): CKTOTAL, CKMB, CKMBINDEX, TROPONINI in the last 168 hours. BNP (last 3 results) No results for input(s): PROBNP in the last 8760 hours. CBG:  Recent Labs Lab 04/24/14 0812 04/25/14 0746  GLUCAP 95 79    No results found for this or any previous visit (from the past 240 hour(s)).   Studies: Ct Abdomen Pelvis W Contrast  04/23/2014   CLINICAL DATA:  38 year old male with epigastric pain, vomiting and dysphagia for  the past several months.  EXAM: CT ABDOMEN AND PELVIS WITH CONTRAST  TECHNIQUE: Multidetector CT imaging of the abdomen and pelvis was performed using the standard protocol following bolus administration of intravenous contrast.  CONTRAST:  176mL OMNIPAQUE IOHEXOL 300 MG/ML  SOLN  COMPARISON:  No priors.  FINDINGS: Lower chest: Several pulmonary nodules are noted in the visualized lung bases bilaterally, the largest of which measures up to 1 cm in the right lower lobe (image 12 of series  3). Profound thickening of the distal esophagus extending beyond the gastroesophageal junction into the proximal stomach.  Hepatobiliary: No cystic or solid hepatic lesions. No intra or extrahepatic biliary ductal dilatation. Gallbladder is remarkable for some amorphous increased attenuation dependently, likely to reflect some biliary sludge.  Pancreas: Fatty atrophy in the pancreas.  Otherwise, unremarkable.  Spleen: Unremarkable.  Adrenals/Urinary Tract: Bilateral adrenal glands and the right kidney are unremarkable in appearance. Exophytic 2.4 cm low-attenuation lesion in the anterior aspect of the upper pole of the left kidney is compatible with a simple cyst. No hydroureteronephrosis. Urinary bladder is normal in appearance.  Stomach/Bowel: Marked thickening of the gastric wall at the gastroesophageal junction, extending into the cardia and fundus of the stomach, most evident along the lesser curvature. This is contiguous with the previously mentioned esophageal wall thickening. No pathologic dilatation of small bowel or colon.  Vascular/Lymphatic: Extensive upper abdominal lymphadenopathy, predominantly in the gastrohepatic ligament and celiac axis nodal stations, with the largest single lymph node measuring up to 3.7 x 3.0 cm in the celiac axis nodal station (image 27 of series 2). Portacaval lymphadenopathy measuring up to 1.7 cm. Notably, many of the enlarged celiac axis lymph nodes appear partially calcified, which could suggest a mucinous subtype of neoplasm. Enlarged left paraaortic retroperitoneal lymph nodes measuring up to 2.4 x 4.2 cm adjacent to the left renal vein (image 33 of series 2). No significant atherosclerotic disease in the abdominal or pelvic vasculature.  Reproductive: Prostate gland and seminal vesicles are unremarkable in appearance.  Other: Moderate volume of ascites, presumably malignant. Extensive soft tissue nodularity throughout the omentum, compatible with omental caking and  widespread peritoneal metastasis. Additionally, there are other areas of enhancing nodularity along the peritoneal surface. Tiny ventral and umbilical hernias containing predominantly omental fat, including some omental implants. No pneumoperitoneum.  Musculoskeletal: There are no aggressive appearing lytic or blastic lesions noted in the visualized portions of the skeleton.  IMPRESSION: 1. Findings, as above, highly concerning for either esophageal or gastric primary malignancy, with metastatic lymphadenopathy in the upper abdomen and retroperitoneum, widespread intraperitoneal spread of disease, malignant ascites, and multiple pulmonary nodules in the visualize lung bases concerning for pulmonary metastasis. Oncologic evaluation is recommended. 2. Additional incidental findings, as above. These results were called by telephone at the time of interpretation on 04/23/2014 at 7:53 pm to [place] , who verbally acknowledged these results.   Electronically Signed   By: Vinnie Langton M.D.   On: 04/23/2014 19:56    Scheduled Meds: . antiseptic oral rinse  7 mL Mouth Rinse q12n4p  . chlorhexidine  15 mL Mouth Rinse BID  . docusate sodium  100 mg Oral BID  . famotidine (PEPCID) IV  20 mg Intravenous Q12H  . heparin  5,000 Units Subcutaneous 3 times per day  . morphine  15 mg Oral Q12H   Continuous Infusions:    Principal Problem:   Metastatic cancer Active Problems:   GE junction carcinoma   Abdominal pain   QT prolongation  Protein-calorie malnutrition, severe   Ascites    Time spent: 38 mins    [facility] MD [facility] Pager [phone]. If 7PM-7AM, please contact night-coverage at [email or web address], password [facility] 04/25/2014, 5:02 PM  LOS: 2 days

## 2014-04-25 NOTE — Transfer of Care (Signed)
Immediate Anesthesia Transfer of Care Note  Patient: Samuel Durham  Procedure(s) Performed: Procedure(s): ESOPHAGOGASTRODUODENOSCOPY (EGD) WITH PROPOFOL (N/A)  Patient Location: PACU and Endoscopy Unit  Anesthesia Type:MAC  Level of Consciousness: awake, oriented and patient cooperative  Airway & Oxygen Therapy: Patient Spontanous Breathing and Patient connected to nasal cannula oxygen  Post-op Assessment: Report given to PACU RN and Post -op Vital signs reviewed and stable  Post vital signs: Reviewed and stable  Complications: No apparent anesthesia complications

## 2014-04-25 NOTE — Anesthesia Postprocedure Evaluation (Signed)
Anesthesia Post Note  Patient: Samuel Durham  Procedure(s) Performed: Procedure(s) (LRB): ESOPHAGOGASTRODUODENOSCOPY (EGD) WITH PROPOFOL (N/A)  Anesthesia type: MAC  Patient location: PACU  Post pain: Pain level controlled  Post assessment: Post-op Vital signs reviewed  Last Vitals: BP 156/79 mmHg  Pulse 82  Temp(Src) 36.7 C (Oral)  Resp 12  Ht 5\' 9"  (1.753 m)  Wt 292 lb 15.9 oz (132.9 kg)  BMI 43.25 kg/m2  SpO2 100%  Post vital signs: Reviewed  Level of consciousness: awake  Complications: No apparent anesthesia complications

## 2014-04-25 NOTE — Interval H&P Note (Signed)
History and Physical Interval Note:  04/25/2014 10:18 AM  Samuel Durham  has presented today for surgery, with the diagnosis of abnormal CTscan, nausea, vomiting  The various methods of treatment have been discussed with the patient and family. After consideration of risks, benefits and other options for treatment, the patient has consented to  Procedure(s): ESOPHAGOGASTRODUODENOSCOPY (EGD) WITH PROPOFOL (N/A) as a surgical intervention .  The patient's history has been reviewed, patient examined, no change in status, stable for surgery.  I have reviewed the patient's chart and labs.  Questions were answered to the patient's satisfaction.     Milus Banister

## 2014-04-25 NOTE — Anesthesia Preprocedure Evaluation (Signed)
Anesthesia Evaluation  Patient identified by MRN, date of birth, ID band Patient awake    Reviewed: Allergy & Precautions, H&P , NPO status , Patient's Chart, lab work & pertinent test results  Airway Mallampati: II  TM Distance: >3 FB Neck ROM: Full    Dental no notable dental hx.    Pulmonary neg pulmonary ROS,  breath sounds clear to auscultation  Pulmonary exam normal       Cardiovascular negative cardio ROS  Rhythm:Regular Rate:Normal     Neuro/Psych negative neurological ROS  negative psych ROS   GI/Hepatic negative GI ROS, Neg liver ROS,   Endo/Other  Morbid obesity  Renal/GU negative Renal ROS     Musculoskeletal negative musculoskeletal ROS (+)   Abdominal   Peds  Hematology negative hematology ROS (+)   Anesthesia Other Findings   Reproductive/Obstetrics                             Anesthesia Physical Anesthesia Plan  ASA: III  Anesthesia Plan: MAC   Post-op Pain Management:    Induction:   Airway Management Planned:   Additional Equipment:   Intra-op Plan:   Post-operative Plan:   Informed Consent: I have reviewed the patients History and Physical, chart, labs and discussed the procedure including the risks, benefits and alternatives for the proposed anesthesia with the patient or authorized representative who has indicated his/her understanding and acceptance.   Dental advisory given  Plan Discussed with: CRNA  Anesthesia Plan Comments:         Anesthesia Quick Evaluation

## 2014-04-25 NOTE — H&P (View-Only) (Signed)
Consultation  Referring Provider: [facility]     Primary Care Physician:  No primary care provider on file. Primary Gastroenterologist: none        Reason for Consultation:  Nausea / vomiting/ weight loss / abnormal CTscan            HPI:   Samuel Durham is a 38 y.o. male who presented to ED with a several month history of progressive upper abdominal pain, nausea /vomiting and weight loss. 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 No [place] of gastric cancer. Patient here with sister, parents on the way from [place].   Past Medical History  Morbid obesity Squamous cell cancer of head Date    Past Surgical History  Procedure Laterality Date  . Squamous cell carcinoma excision  approx March 2015    removed from top of head    Family History  Problem Relation Age of Onset  . Breast cancer Sister   . Hypertension Mother   . Hypertension Father      History  Substance Use Topics  . Smoking status: Never Smoker   . Smokeless tobacco: Never Used  . Alcohol Use: No    Prior to Admission medications   Not on File    Current Facility-Administered Medications  Medication Dose Route Frequency Provider Last Rate Last Dose  . 0.9 %  sodium chloride infusion   Intravenous Continuous Ivor Costa, MD 125 mL/hr at 04/24/14 0546    . antiseptic oral rinse (CPC / CETYLPYRIDINIUM CHLORIDE 0.05%) solution 7 mL  7 mL Mouth Rinse q12n4p Ivor Costa, MD   7 mL at 04/24/14 1253  . chlorhexidine (PERIDEX) 0.12 % solution 15 mL  15 mL Mouth Rinse BID Ivor Costa, MD   15 mL at 04/24/14 1029  . docusate sodium (COLACE) capsule 100 mg  100 mg Oral BID Eugenie Filler, MD   100 mg at 04/24/14 1253  . famotidine (PEPCID) IVPB 20 mg  20 mg Intravenous Q12H Ivor Costa, MD   20 mg at 04/24/14 1029  . heparin injection 5,000 Units  5,000 Units Subcutaneous 3 times per day Ivor Costa, MD   5,000 Units at 04/24/14 1253  . hydrOXYzine (VISTARIL) injection 25 mg  25 mg Intramuscular Q6H  PRN Ivor Costa, MD      . morphine (MS CONTIN) 12 hr tablet 15 mg  15 mg Oral Q12H Ivor Costa, MD   15 mg at 04/24/14 1029  . morphine 2 MG/ML injection 2 mg  2 mg Intravenous Q4H PRN Ivor Costa, MD   2 mg at 04/24/14 1253    Allergies as of 04/23/2014  . (No Known Allergies)    Review of Systems:    All systems reviewed and negative except where noted in HPI.    Physical Exam:  Vital signs in last 24 hours: Temp:  [98.1 F (36.7 C)-98.4 F (36.9 C)] 98.3 F (36.8 C) (12/16 0530) Pulse Rate:  [70-103] 70 (12/16 0530) Resp:  [15-20] 15 (12/16 0530) BP: (124-143)/(74-92) 136/82 mmHg (12/16 0530) SpO2:  [95 %-99 %] 99 % (12/16 0530) Weight:  [288 lb 8 oz (130.863 kg)-288 lb 12.8 oz (130.999 kg)] 288 lb 12.8 oz (130.999 kg) (12/16 0530) Last BM Date: 04/23/14 General:   Pleasant white male in NAD Head:  Normocephalic and atraumatic. Eyes:   No icterus.   Conjunctiva pink. Ears:  Normal auditory acuity. Neck:  Supple; no masses felt Lungs:  Respirations even and unlabored. Lungs clear to  auscultation bilaterally.     Heart:  Regular rate and rhythm Abdomen:  Soft, obese,mild LUQ tenderness.   Rectal:  Not performed.  Msk:  Symmetrical without gross deformities.  Extremities:  Without edema. Neurologic:  Alert and  oriented x4;  grossly normal neurologically. Skin:  Intact without significant lesions or rashes. Cervical Nodes:  No significant cervical adenopathy. Psych:  Alert and cooperative. Normal affect.  LAB RESULTS:  Recent Labs  04/23/14 1624 04/24/14 0845  WBC 11.1* 9.2  HGB 12.5* 11.3*  HCT 38.6* 36.4*  PLT 579* 539*   BMET  Recent Labs  04/23/14 1624 04/24/14 0845  NA 135* 134*  K 4.1 4.3  CL 95* 96  CO2 23 26  GLUCOSE 113* 91  BUN 18 17  CREATININE 0.97 0.99  CALCIUM 9.1 8.7   LFT  Recent Labs  04/24/14 0845  PROT 6.4  ALBUMIN 2.4*  AST 16  ALT 11  ALKPHOS 87  BILITOT 0.4   PT/INR  Recent Labs  04/23/14 2305  LABPROT 15.2  INR  1.18    STUDIES: Ct Abdomen Pelvis W Contrast  04/23/2014   CLINICAL DATA:  38 year old male with epigastric pain, vomiting and dysphagia for the past several months.  EXAM: CT ABDOMEN AND PELVIS WITH CONTRAST  TECHNIQUE: Multidetector CT imaging of the abdomen and pelvis was performed using the standard protocol following bolus administration of intravenous contrast.  CONTRAST:  160m OMNIPAQUE IOHEXOL 300 MG/ML  SOLN  COMPARISON:  No priors.  FINDINGS: Lower chest: Several pulmonary nodules are noted in the visualized lung bases bilaterally, the largest of which measures up to 1 cm in the right lower lobe (image 12 of series 3). Profound thickening of the distal esophagus extending beyond the gastroesophageal junction into the proximal stomach.  Hepatobiliary: No cystic or solid hepatic lesions. No intra or extrahepatic biliary ductal dilatation. Gallbladder is remarkable for some amorphous increased attenuation dependently, likely to reflect some biliary sludge.  Pancreas: Fatty atrophy in the pancreas.  Otherwise, unremarkable.  Spleen: Unremarkable.  Adrenals/Urinary Tract: Bilateral adrenal glands and the right kidney are unremarkable in appearance. Exophytic 2.4 cm low-attenuation lesion in the anterior aspect of the upper pole of the left kidney is compatible with a simple cyst. No hydroureteronephrosis. Urinary bladder is normal in appearance.  Stomach/Bowel: Marked thickening of the gastric wall at the gastroesophageal junction, extending into the cardia and fundus of the stomach, most evident along the lesser curvature. This is contiguous with the previously mentioned esophageal wall thickening. No pathologic dilatation of small bowel or colon.  Vascular/Lymphatic: Extensive upper abdominal lymphadenopathy, predominantly in the gastrohepatic ligament and celiac axis nodal stations, with the largest single lymph node measuring up to 3.7 x 3.0 cm in the celiac axis nodal station (image 27 of series  2). Portacaval lymphadenopathy measuring up to 1.7 cm. Notably, many of the enlarged celiac axis lymph nodes appear partially calcified, which could suggest a mucinous subtype of neoplasm. Enlarged left paraaortic retroperitoneal lymph nodes measuring up to 2.4 x 4.2 cm adjacent to the left renal vein (image 33 of series 2). No significant atherosclerotic disease in the abdominal or pelvic vasculature.  Reproductive: Prostate gland and seminal vesicles are unremarkable in appearance.  Other: Moderate volume of ascites, presumably malignant. Extensive soft tissue nodularity throughout the omentum, compatible with omental caking and widespread peritoneal metastasis. Additionally, there are other areas of enhancing nodularity along the peritoneal surface. Tiny ventral and umbilical hernias containing predominantly omental fat, including some omental implants. No  pneumoperitoneum.  Musculoskeletal: There are no aggressive appearing lytic or blastic lesions noted in the visualized portions of the skeleton.  IMPRESSION: 1. Findings, as above, highly concerning for either esophageal or gastric primary malignancy, with metastatic lymphadenopathy in the upper abdomen and retroperitoneum, widespread intraperitoneal spread of disease, malignant ascites, and multiple pulmonary nodules in the visualize lung bases concerning for pulmonary metastasis. Oncologic evaluation is recommended. 2. Additional incidental findings, as above. These results were called by telephone at the time of interpretation on 04/23/2014 at 7:53 pm to [place] , who verbally acknowledged these results.   Electronically Signed   By: Vinnie Langton M.D.   On: 04/23/2014 19:56    PREVIOUS ENDOSCOPIES:            none   Impression / Plan:   63. 37 year old male with several month history of progressive abdominal pain, nausea, vomiting, weight loss. CTscan reveals widely metastatic disease with possible gastric primary. Oncology has already  evaluated. We will plan for EGD with biopsies to be done in am.   2. Mild microcytic anemia, possibly secondary to chronic GI blood loss from a gastric cancer.   3. Hypoalbuminemia, likely malnutrition in setting of nausea / vomiting and weight loss.  Thanks   LOS: 1 day   Tye Savoy  04/24/2014, 1:37 PM    ________________________________________________________________________  Velora Heckler GI MD note:  I personally examined the patient, reviewed the data and agree with the assessment and plan described above.  I met with pt, several family members.  Planning on EGD tomorrow.  Will likely be late morning.     Owens Loffler, MD [facility]astroenterology Pager [phone]

## 2014-04-26 ENCOUNTER — Inpatient Hospital Stay (HOSPITAL_COMMUNITY): Payer: Medicaid Other

## 2014-04-26 ENCOUNTER — Encounter (HOSPITAL_COMMUNITY): Payer: Self-pay | Admitting: Gastroenterology

## 2014-04-26 DIAGNOSIS — C169 Malignant neoplasm of stomach, unspecified: Secondary | ICD-10-CM

## 2014-04-26 DIAGNOSIS — R630 Anorexia: Secondary | ICD-10-CM

## 2014-04-26 DIAGNOSIS — C786 Secondary malignant neoplasm of retroperitoneum and peritoneum: Principal | ICD-10-CM

## 2014-04-26 DIAGNOSIS — R180 Malignant ascites: Secondary | ICD-10-CM

## 2014-04-26 DIAGNOSIS — R1084 Generalized abdominal pain: Secondary | ICD-10-CM | POA: Diagnosis present

## 2014-04-26 LAB — BODY FLUID CELL COUNT WITH DIFFERENTIAL
Lymphs, Fluid: 49 %
Monocyte-Macrophage-Serous Fluid: 35 % — ABNORMAL LOW (ref 50–90)
Neutrophil Count, Fluid: 16 % (ref 0–25)
WBC FLUID: 1157 uL — AB (ref 0–1000)

## 2014-04-26 LAB — CEA: CEA: 5.6 ng/mL — AB (ref 0.0–5.0)

## 2014-04-26 LAB — PROTEIN, BODY FLUID: TOTAL PROTEIN, FLUID: 3.8 g/dL

## 2014-04-26 LAB — PROTIME-INR
INR: 1.23 (ref 0.00–1.49)
PROTHROMBIN TIME: 15.6 s — AB (ref 11.6–15.2)

## 2014-04-26 LAB — CBC
HCT: 38 % — ABNORMAL LOW (ref 39.0–52.0)
Hemoglobin: 11.6 g/dL — ABNORMAL LOW (ref 13.0–17.0)
MCH: 23.8 pg — AB (ref 26.0–34.0)
MCHC: 30.5 g/dL (ref 30.0–36.0)
MCV: 77.9 fL — ABNORMAL LOW (ref 78.0–100.0)
PLATELETS: 570 10*3/uL — AB (ref 150–400)
RBC: 4.88 MIL/uL (ref 4.22–5.81)
RDW: 14.9 % (ref 11.5–15.5)
WBC: 9.8 10*3/uL (ref 4.0–10.5)

## 2014-04-26 LAB — CANCER ANTIGEN 19-9: CA 19 9: 13 U/mL — AB (ref <35.0)

## 2014-04-26 LAB — GLUCOSE, SEROUS FLUID: Glucose, Fluid: 84 mg/dL

## 2014-04-26 LAB — LACTATE DEHYDROGENASE, PLEURAL OR PERITONEAL FLUID: LD, Fluid: 457 U/L — ABNORMAL HIGH (ref 3–23)

## 2014-04-26 LAB — ALBUMIN, FLUID (OTHER): Albumin, Fluid: 1.9 g/dL

## 2014-04-26 LAB — BASIC METABOLIC PANEL
ANION GAP: 12 (ref 5–15)
BUN: 12 mg/dL (ref 6–23)
CALCIUM: 8.8 mg/dL (ref 8.4–10.5)
CO2: 25 mEq/L (ref 19–32)
CREATININE: 0.9 mg/dL (ref 0.50–1.35)
Chloride: 98 mEq/L (ref 96–112)
GFR calc Af Amer: >90 mL/min (ref >90)
GFR calc non Af Amer: >90 mL/min (ref >90)
Glucose, Bld: 144 mg/dL — ABNORMAL HIGH (ref 70–99)
Potassium: 3.9 mEq/L (ref 3.7–5.3)
Sodium: 135 mEq/L — ABNORMAL LOW (ref 137–147)

## 2014-04-26 LAB — GLUCOSE, CAPILLARY: Glucose-Capillary: 103 mg/dL — ABNORMAL HIGH (ref 70–99)

## 2014-04-26 NOTE — Progress Notes (Signed)
[facility] PROGRESS NOTE  Samuel Durham ENI:[id number] DOB: 20-Dec-1975 DOA: 04/23/2014 PCP: No primary care provider on file.  Assessment/Plan: #1. 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 Patient denies any nausea, or emesis. Patient currently tolerating current diet. Patient is status post upper endoscopy which showed a large nearly circumferential ulcerated clearly malignant mass that she struggles the GE junction. Has bulk of the mass in the stomach occupying the third of the stomach area mass extends into the esophagus for about 4 cm above the GE junction. Biopsies pending. Continue current diet. GI and oncology following and appreciate day per recommendations.   #2 ascites Likely secondary to problems #1 and 3. Will ask interventional radiology to do a diagnostic and therapeutic paracentesis today.  #3 abdominal pain Secondary to problem #1.  #4 prolonged QTc interval Repeat EKG with resolution of QTC prolongation. Follow.  #5 prophylaxis Pepcid for GI prophylaxis. Heparin for DVT prophylaxis.  Code Status: Full Family Communication: Updated patient and parents at bedside. Disposition Plan: Remain in pateint.   Consultants:  Gastroenterology: Dr. Ardis Hughs 04/24/2014  Oncology: Dr. Burr Medico 04/24/2014  Procedures:  CT abd/pelvis 04/23/14  Upper endoscopy 04/25/2014 Dr. Ardis Hughs  Antibiotics:  None  HPI/Subjective: Patient states nausea improved. No emesis. No abdominal pain. Patient tolerating current diet.  Objective: Filed Vitals:   04/26/14 0621  BP: 97/48  Pulse: 64  Temp: 98.2 F (36.8 C)  Resp: 16    Intake/Output Summary (Last 24 hours) at 04/26/14 1023 Last data filed at 04/25/14 1112  Gross per 24 hour  Intake    700 ml  Output      0 ml  Net    700 ml   Filed Weights   04/24/14 0530 04/25/14 0654 04/26/14 0621  Weight: 130.999 kg (288 lb 12.8 oz) 132.9 kg (292  lb 15.9 oz) 132 kg (291 lb 0.1 oz)    Exam:   General:  NAD  Cardiovascular: RRR  Respiratory: CTAB  Abdomen: Soft/NT/distended/+BS  Musculoskeletal: No c/c/e  Data Reviewed: Basic Metabolic Panel:  Recent Labs Lab 04/23/14 1624 04/24/14 0845 04/25/14 0434 04/26/14 0810  NA 135* 134* 137 135*  K 4.1 4.3 4.2 3.9  CL 95* 96 99 98  CO2 23 26 26 25   GLUCOSE 113* 91 88 144*  BUN 18 17 15 12   CREATININE 0.97 0.99 0.98 0.90  CALCIUM 9.1 8.7 8.7 8.8  MG  --  2.2 2.3  --    Liver Function Tests:  Recent Labs Lab 04/23/14 1624 04/24/14 0845 04/25/14 0434  AST 22 16 17   ALT 14 11 10   ALKPHOS 101 87 82  BILITOT 0.5 0.4 0.5  PROT 7.4 6.4 6.3  ALBUMIN 2.8* 2.4* 2.3*    Recent Labs Lab 04/23/14 1624  LIPASE 25   No results for input(s): AMMONIA in the last 168 hours. CBC:  Recent Labs Lab 04/23/14 1624 04/24/14 0845 04/25/14 0434 04/26/14 0810  WBC 11.1* 9.2 9.2 9.8  NEUTROABS 8.0* 6.7 6.2  --   HGB 12.5* 11.3* 11.4* 11.6*  HCT 38.6* 36.4* 37.9* 38.0*  MCV 75.5* 76.5* 78.6 77.9*  PLT 579* 539* 632* 570*   Cardiac Enzymes: No results for input(s): CKTOTAL, CKMB, CKMBINDEX, TROPONINI in the last 168 hours. BNP (last 3 results) No results for input(s): PROBNP in the last 8760 hours. CBG:  Recent Labs Lab 04/24/14 0812 04/25/14 0746 04/26/14 0823  GLUCAP 95 79 103*    No results found  for this or any previous visit (from the past 240 hour(s)).   Studies: No results found.  Scheduled Meds: . antiseptic oral rinse  7 mL Mouth Rinse q12n4p  . chlorhexidine  15 mL Mouth Rinse BID  . docusate sodium  100 mg Oral BID  . famotidine (PEPCID) IV  20 mg Intravenous Q12H  . heparin  5,000 Units Subcutaneous 3 times per day  . morphine  15 mg Oral Q12H   Continuous Infusions:    Principal Problem:   Metastatic cancer Active Problems:   GE junction carcinoma   Abdominal pain   QT prolongation   Protein-calorie malnutrition, severe    Ascites   Generalized abdominal pain    Time spent: 35 mins    [facility] [place] MD [facility] Pager [phone]. If 7PM-7AM, please contact night-coverage at [email or web address], password [facility] 04/26/2014, 10:23 AM  LOS: 3 days

## 2014-04-26 NOTE — Progress Notes (Signed)
Samuel Durham   DOB:1975-12-03   OV#:[id number]   YOK#:[id number] ONCOLOGY FOLLOW UP   Subjective: Pt underwent paracentesis this afternoon. I discussed his gastric biopsy result with pt and his family members.    Objective:  Filed Vitals:   04/26/14 2107  BP: 143/79  Pulse: 78  Temp: 98.1 F (36.7 C)  Resp: 18    Body mass index is 42.95 kg/(m^2).  Intake/Output Summary (Last 24 hours) at 04/26/14 2250 Last data filed at 04/26/14 2108  Gross per 24 hour  Intake    500 ml  Output      0 ml  Net    500 ml     Sclerae unicteric  Oropharynx clear  No peripheral adenopathy  Lungs clear -- no rales or rhonchi  Heart regular rate and rhythm  Abdomen distended, (+) tenderness   MSK no focal spinal tenderness, no peripheral edema  Neuro nonfocal    CBG (last 3)   Recent Labs  04/24/14 0812 04/25/14 0746 04/26/14 0823  GLUCAP 95 79 103*     Labs:  Lab Results  Component Value Date   WBC 9.8 04/26/2014   HGB 11.6* 04/26/2014   HCT 38.0* 04/26/2014   MCV 77.9* 04/26/2014   PLT 570* 04/26/2014   NEUTROABS 6.2 04/25/2014    @LASTCHEMISTRY @  Urine Studies No results for input(s): UHGB, CRYS in the last 72 hours.  Invalid input(s): UACOL, UAPR, USPG, UPH, UTP, UGL, UKET, UBIL, UNIT, UROB, ULEU, UEPI, UWBC, Pamala Duff[place], [place]  Basic Metabolic Panel:  Recent Labs Lab 04/23/14 1624 04/24/14 0845 04/25/14 0434 04/26/14 0810  NA 135* 134* 137 135*  K 4.1 4.3 4.2 3.9  CL 95* 96 99 98  CO2 23 26 26 25   GLUCOSE 113* 91 88 144*  BUN 18 17 15 12   CREATININE 0.97 0.99 0.98 0.90  CALCIUM 9.1 8.7 8.7 8.8  MG  --  2.2 2.3  --    GFR Estimated Creatinine Clearance: 146.9 mL/min (by C-G formula based on Cr of 0.9). Liver Function Tests:  Recent Labs Lab 04/23/14 1624 04/24/14 0845 04/25/14 0434  AST 22 16 17   ALT 14 11 10   ALKPHOS 101 87 82  BILITOT 0.5 0.4 0.5  PROT 7.4 6.4 6.3  ALBUMIN 2.8* 2.4* 2.3*    Recent Labs Lab 04/23/14 1624   LIPASE 25   No results for input(s): AMMONIA in the last 168 hours. Coagulation profile  Recent Labs Lab 04/23/14 2305 04/26/14 0810  INR 1.18 1.23    CBC:  Recent Labs Lab 04/23/14 1624 04/24/14 0845 04/25/14 0434 04/26/14 0810  WBC 11.1* 9.2 9.2 9.8  NEUTROABS 8.0* 6.7 6.2  --   HGB 12.5* 11.3* 11.4* 11.6*  HCT 38.6* 36.4* 37.9* 38.0*  MCV 75.5* 76.5* 78.6 77.9*  PLT 579* 539* 632* 570*   Cardiac Enzymes: No results for input(s): CKTOTAL, CKMB, CKMBINDEX, TROPONINI in the last 168 hours. BNP: Invalid input(s): POCBNP CBG:  Recent Labs Lab 04/24/14 0812 04/25/14 0746 04/26/14 0823  GLUCAP 95 79 103*   PATHOLOGY REPORT Diagnosis Stomach, biopsy, r/o cancer - ADENOCARCINOMA WITH SIGNET RING FEATURES. Microscopic Comment   Studies:  US Paracentesis  04/26/2014   INDICATION: GE junction mass, lymphadenopathy, ascites. Request is made for diagnostic and therapeutic paracentesis.  EXAM: ULTRASOUND-GUIDED DIAGNOSTIC AND THERAPEUTIC PARACENTESIS  COMPARISON:  None.  MEDICATIONS: None.  COMPLICATIONS: None immediate  TECHNIQUE: Informed written consent was obtained from the patient after a discussion of the risks, benefits and alternatives  to treatment. A timeout was performed prior to the initiation of the procedure.  Initial ultrasound scanning demonstrates a large amount of ascites within the right mid to lower abdominal quadrant. The right lmid to lower abdomen was prepped and draped in the usual sterile fashion. 1% lidocaine was used for local anesthesia. Under direct ultrasound guidance, a 19 gauge, 10-cm, Yueh catheter was introduced. An ultrasound image was saved for documentation purposed. The paracentesis was performed. The catheter was removed and a dressing was applied. The patient tolerated the procedure well without immediate post procedural complication.  FINDINGS: A total of approximately 4.5 liters of slightly turbid, yellow fluid was removed. Samples were  sent to the laboratory as requested by the clinical team.  IMPRESSION: Successful ultrasound-guided diagnostic and therapeutic paracentesis yielding 4.5 liters of peritoneal fluid.  Read by: Rowe Robert, PA-C   Electronically Signed   By: Corrie Mckusick D.O.   On: 04/26/2014 15:32    Assessment and plan: 38 y.o. with newly diagnosed gastric adenocarcinoma, evidence of peritoneal carcinomatosis.   I discussed his gastric biopsy result with pt and his family members. Unfortunately, his multiple gastric mass and mucosa biopsy all came back (+) adeno with signet-ring features, likely diffusely involved the stomach. His paracentesis removed large volume ascites, likley malignant, cytology is still pending . He likely has stage IV disease, with diffuse adenopathy and peritoneal metastases. I spoke with pathologist Dr. Saralyn Pilar and ordered HER2 test.   The overall prognosis is very poor, life expentancy likely less than 6 month. I recommend chemotherapy with FOLFOX. Need port placement next week. If he is going to be discharged home this weekend, I will arrange port placement as an outpt next week. I will see him in my clinic on 12/28 or 12/29 and start chemo that week.    Consider mirtazapine 15 mg QHS for appetite, will consider IV iron as outpt for his anemia.      Truitt Merle, MD 04/26/2014  10:50 PM

## 2014-04-26 NOTE — Procedures (Signed)
US guided diagnostic/therapeutic paracentesis performed yielding 4.5 liters slightly turbid, yellow fluid. A portion of the fluid was sent to the lab for preordered studies. No immediate complications.

## 2014-04-27 DIAGNOSIS — C801 Malignant (primary) neoplasm, unspecified: Secondary | ICD-10-CM

## 2014-04-27 DIAGNOSIS — C786 Secondary malignant neoplasm of retroperitoneum and peritoneum: Secondary | ICD-10-CM | POA: Diagnosis present

## 2014-04-27 LAB — COMPREHENSIVE METABOLIC PANEL
ALBUMIN: 2.1 g/dL — AB (ref 3.5–5.2)
ALT: 9 U/L (ref 0–53)
ANION GAP: 13 (ref 5–15)
AST: 18 U/L (ref 0–37)
Alkaline Phosphatase: 101 U/L (ref 39–117)
BILIRUBIN TOTAL: 0.5 mg/dL (ref 0.3–1.2)
BUN: 13 mg/dL (ref 6–23)
CALCIUM: 8.7 mg/dL (ref 8.4–10.5)
CHLORIDE: 100 meq/L (ref 96–112)
CO2: 26 mEq/L (ref 19–32)
CREATININE: 0.92 mg/dL (ref 0.50–1.35)
GFR calc Af Amer: >90 mL/min (ref >90)
GFR calc non Af Amer: >90 mL/min (ref >90)
Glucose, Bld: 113 mg/dL — ABNORMAL HIGH (ref 70–99)
Potassium: 4.2 mEq/L (ref 3.7–5.3)
Sodium: 139 mEq/L (ref 137–147)
TOTAL PROTEIN: 6 g/dL (ref 6.0–8.3)

## 2014-04-27 LAB — PH, BODY FLUID: pH, Fluid: 8.5

## 2014-04-27 LAB — CBC
HEMATOCRIT: 36.3 % — AB (ref 39.0–52.0)
HEMOGLOBIN: 11 g/dL — AB (ref 13.0–17.0)
MCH: 23.7 pg — ABNORMAL LOW (ref 26.0–34.0)
MCHC: 30.3 g/dL (ref 30.0–36.0)
MCV: 78.2 fL (ref 78.0–100.0)
Platelets: 570 10*3/uL — ABNORMAL HIGH (ref 150–400)
RBC: 4.64 MIL/uL (ref 4.22–5.81)
RDW: 14.9 % (ref 11.5–15.5)
WBC: 10.3 10*3/uL (ref 4.0–10.5)

## 2014-04-27 LAB — GLUCOSE, CAPILLARY: Glucose-Capillary: 91 mg/dL (ref 70–99)

## 2014-04-27 LAB — LACTATE DEHYDROGENASE: LDH: 295 U/L — AB (ref 94–250)

## 2014-04-27 LAB — AMYLASE, PERITONEAL FLUID: Amylase, peritoneal fluid: 11 U/L

## 2014-04-27 MED ORDER — PANTOPRAZOLE SODIUM 40 MG PO TBEC: 40.0000 mg | DELAYED_RELEASE_TABLET | Freq: Every day | ORAL | Status: AC

## 2014-04-27 MED ORDER — MORPHINE SULFATE ER 15 MG PO TBCR: 15.0000 mg | EXTENDED_RELEASE_TABLET | Freq: Two times a day (BID) | ORAL | Status: AC

## 2014-04-27 MED ORDER — TRAMADOL HCL 50 MG PO TABS: 50.0000 mg | ORAL_TABLET | Freq: Four times a day (QID) | ORAL | Status: AC | PRN

## 2014-04-27 MED ORDER — DSS 100 MG PO CAPS: 100.0000 mg | ORAL_CAPSULE | Freq: Two times a day (BID) | ORAL | Status: AC

## 2014-04-27 MED ORDER — ONDANSETRON HCL 4 MG PO TABS: 4.0000 mg | ORAL_TABLET | Freq: Three times a day (TID) | ORAL | Status: DC | PRN

## 2014-04-27 NOTE — Progress Notes (Signed)
Patient was stable at time of discharge. I removed patient's IV. We reviewed his discharge education. He had no further questions. He left with his prescriptions in hand.

## 2014-04-27 NOTE — Discharge Summary (Signed)
Physician Discharge Summary  [name]:[id number] DOB: 04/08/1976 DOA: 04/23/2014  PCP: No primary care provider on file.  Admit date: 04/23/2014 Discharge date: 04/27/2014  Time spent: 65 minutes  Recommendations for Outpatient Follow-up:  1. Follow-up with Dr. Burr Medico of oncology on December 28 or 29th. On follow-up patient will need arrangements made for Port-A-Cath placement. Cytology from ascitic fluid will need to be followed up upon. Patient will need a basic metabolic profile and a CBC done.  Discharge Diagnoses:  Principal Problem:   Gastric adenocarcinoma: Stage IV Active Problems:   Metastatic cancer   Abdominal pain   QT prolongation   Protein-calorie malnutrition, severe   Ascites   Generalized abdominal pain   Abdominal pain, generalized   Peritoneal carcinomatosis   Discharge Condition: Stable  Diet recommendation: Regular  Filed Weights   04/25/14 0654 04/26/14 0621 04/27/14 0541  Weight: 132.9 kg (292 lb 15.9 oz) 132 kg (291 lb 0.1 oz) 130.364 kg (287 lb 6.4 oz)    History of present illness:  Samuel Durham is a 38 y.o. male with PMH of squamous cell skin cancer, who presented with abdominal pain.  Patient reported that he started having abdominal pain 4 month ago. It is located in the epigastric area. it was initially mild, intermittent pain. He did not pay much attention. During the last month, his abdominal pain has been progressively getting worse. He started having nausea and vomiting whenever he ate food. He also has loose stools. He has very mild, occasional acid reflux problem in the past. He reported 30 pounds of body weight loss over past 4 months. He has occasional mild dry cough, no chest pain or shortness of breath. No family history full gastric or esophageal cancer. One of her sisters has breast cancer.  Patient denied fever, chills, headaches, chest pain, SOB, dysuria, urgency, frequency, hematuria, skin rashes, joint pain or leg  swelling.  In ED, CT-abd/pelvis showed possible esophageal or gastric primary malignancy, with metastatic lymphadenopathy in the upper abdomen and retroperitoneum, widespread intraperitoneal spread of disease, malignant ascites, and multiple pulmonary nodules in the visualize lung bases concerning for pulmonary metastasis. Patient is admitted to inpatient for further evaluation and treatment.   Hospital Course:  #1. Probable Metatastic Cancer/malignant mass straddling GE junction and stomach Patient had presented with a four-month history of worsening abdominal pain nausea and emesis and some loose stools. CT of the abdomen and pelvis which was done was concerning for metastatic GI tumor. Patient was admitted GI and oncology were consulted and followed the patient throughout the hospitalization. Patient did not have any further nausea or emesis. Patient subsequently underwent upper endoscopy which showed a large nearly circumferential ulcerated clearly malignant mass that straddled the GE junction. Has bulk of the mass in the stomach occupying the third of the stomach area mass extends into the esophagus for about 4 cm above the GE junction. Biopsies were taken and were consistent with gastric adenocarcinoma with evidence of peritoneal carcinomatosis. It was felt patient likely has stage IV disease with diffuse adenopathy and peritoneal metastases. Oncologist discussed gastric biopsy results with patient and his family members. It was felt patient had a very poor prognosis life expectancy less than 6 months. Patient will follow-up with oncology as outpatient for chemotherapy treatment and port placement next week. Patient will be discharged in stable condition.  #2 ascites Likely secondary to problems #1 and 3. Patient underwent diagnostic and therapeutic paracenthesis with 4.5 L slightly turbid yellow fluid. Fluid was sent to  the lab for studies including cytology which was pending at the time of  discharge. This will need to be followed up upon by oncology.   #3 abdominal pain Secondary to problem #1.  #4 prolonged QTc interval Repeat EKG with resolution of QTC prolongation. Follow.   Procedures:  CT abd/pelvis 04/23/14  Upper endoscopy 04/25/2014 Dr. Ardis Hughs  Diagnostic and therapeutic paracentesis 04/26/2014 with 4.5L removed.  Consultations:  Gastroenterology: Dr. Ardis Hughs 04/24/2014  Oncology: Dr. Burr Medico 04/24/2014    Discharge Exam: Filed Vitals:   04/27/14 0541  BP: 127/84  Pulse: 69  Temp: 98.4 F (36.9 C)  Resp: 18    General: NAD Cardiovascular: RRR Respiratory: CTAB  Discharge Instructions You were cared for by a hospitalist during your hospital stay. If you have any questions about your discharge medications or the care you received while you were in the hospital after you are discharged, you can call the unit and asked to speak with the hospitalist on call if the hospitalist that took care of you is not available. Once you are discharged, your primary care physician will handle any further medical issues. Please note that NO REFILLS for any discharge medications will be authorized once you are discharged, as it is imperative that you return to your primary care physician (or establish a relationship with a primary care physician if you do not have one) for your aftercare needs so that they can reassess your need for medications and monitor your lab values.  Discharge Instructions    Diet general    Complete by:  As directed      Discharge instructions    Complete by:  As directed   Follow up with Dr Burr Medico in 1 week.     Increase activity slowly    Complete by:  As directed           Discharge Medication List as of 04/27/2014 11:27 AM    START taking these medications   Details  docusate sodium 100 MG CAPS Take 100 mg by mouth 2 (two) times daily., Starting 04/27/2014, Until Discontinued, No Print    morphine (MS CONTIN) 15 MG 12 hr tablet Take  1 tablet (15 mg total) by mouth every 12 (twelve) hours., Starting 04/27/2014, Until Discontinued, Print    ondansetron (ZOFRAN) 4 MG tablet Take 1 tablet (4 mg total) by mouth every 8 (eight) hours as needed for nausea or vomiting., Starting 04/27/2014, Until Discontinued, Print    pantoprazole (PROTONIX) 40 MG tablet Take 1 tablet (40 mg total) by mouth daily., Starting 04/27/2014, Until Discontinued, Print    traMADol (ULTRAM) 50 MG tablet Take 1-2 tablets (50-100 mg total) by mouth every 6 (six) hours as needed for moderate pain., Starting 04/27/2014, Until Discontinued, Print       No Known Allergies Follow-up Information    Follow up with Truitt Merle, MD. Schedule an appointment as soon as possible for a visit in 1 week.   Specialties:  Hematology, Oncology   Contact information:   [address] [place] [address] [phone]        The results of significant diagnostics from this hospitalization (including imaging, microbiology, ancillary and laboratory) are listed below for reference.    Significant Diagnostic Studies: Ct Abdomen Pelvis W Contrast  04/23/2014   CLINICAL DATA:  38 year old male with epigastric pain, vomiting and dysphagia for the past several months.  EXAM: CT ABDOMEN AND PELVIS WITH CONTRAST  TECHNIQUE: Multidetector CT imaging of the abdomen and pelvis was  performed using the standard protocol following bolus administration of intravenous contrast.  CONTRAST:  132mL OMNIPAQUE IOHEXOL 300 MG/ML  SOLN  COMPARISON:  No priors.  FINDINGS: Lower chest: Several pulmonary nodules are noted in the visualized lung bases bilaterally, the largest of which measures up to 1 cm in the right lower lobe (image 12 of series 3). Profound thickening of the distal esophagus extending beyond the gastroesophageal junction into the proximal stomach.  Hepatobiliary: No cystic or solid hepatic lesions. No intra or extrahepatic biliary ductal dilatation. Gallbladder is remarkable for  some amorphous increased attenuation dependently, likely to reflect some biliary sludge.  Pancreas: Fatty atrophy in the pancreas.  Otherwise, unremarkable.  Spleen: Unremarkable.  Adrenals/Urinary Tract: Bilateral adrenal glands and the right kidney are unremarkable in appearance. Exophytic 2.4 cm low-attenuation lesion in the anterior aspect of the upper pole of the left kidney is compatible with a simple cyst. No hydroureteronephrosis. Urinary bladder is normal in appearance.  Stomach/Bowel: Marked thickening of the gastric wall at the gastroesophageal junction, extending into the cardia and fundus of the stomach, most evident along the lesser curvature. This is contiguous with the previously mentioned esophageal wall thickening. No pathologic dilatation of small bowel or colon.  Vascular/Lymphatic: Extensive upper abdominal lymphadenopathy, predominantly in the gastrohepatic ligament and celiac axis nodal stations, with the largest single lymph node measuring up to 3.7 x 3.0 cm in the celiac axis nodal station (image 27 of series 2). Portacaval lymphadenopathy measuring up to 1.7 cm. Notably, many of the enlarged celiac axis lymph nodes appear partially calcified, which could suggest a mucinous subtype of neoplasm. Enlarged left paraaortic retroperitoneal lymph nodes measuring up to 2.4 x 4.2 cm adjacent to the left renal vein (image 33 of series 2). No significant atherosclerotic disease in the abdominal or pelvic vasculature.  Reproductive: Prostate gland and seminal vesicles are unremarkable in appearance.  Other: Moderate volume of ascites, presumably malignant. Extensive soft tissue nodularity throughout the omentum, compatible with omental caking and widespread peritoneal metastasis. Additionally, there are other areas of enhancing nodularity along the peritoneal surface. Tiny ventral and umbilical hernias containing predominantly omental fat, including some omental implants. No pneumoperitoneum.   Musculoskeletal: There are no aggressive appearing lytic or blastic lesions noted in the visualized portions of the skeleton.  IMPRESSION: 1. Findings, as above, highly concerning for either esophageal or gastric primary malignancy, with metastatic lymphadenopathy in the upper abdomen and retroperitoneum, widespread intraperitoneal spread of disease, malignant ascites, and multiple pulmonary nodules in the visualize lung bases concerning for pulmonary metastasis. Oncologic evaluation is recommended. 2. Additional incidental findings, as above. These results were called by telephone at the time of interpretation on 04/23/2014 at 7:53 pm to [place] , who verbally acknowledged these results.   Electronically Signed   By: Vinnie Langton M.D.   On: 04/23/2014 19:56   US Paracentesis  04/26/2014   INDICATION: GE junction mass, lymphadenopathy, ascites. Request is made for diagnostic and therapeutic paracentesis.  EXAM: ULTRASOUND-GUIDED DIAGNOSTIC AND THERAPEUTIC PARACENTESIS  COMPARISON:  None.  MEDICATIONS: None.  COMPLICATIONS: None immediate  TECHNIQUE: Informed written consent was obtained from the patient after a discussion of the risks, benefits and alternatives to treatment. A timeout was performed prior to the initiation of the procedure.  Initial ultrasound scanning demonstrates a large amount of ascites within the right mid to lower abdominal quadrant. The right lmid to lower abdomen was prepped and draped in the usual sterile fashion. 1% lidocaine was used for local anesthesia. Under  direct ultrasound guidance, a 19 gauge, 10-cm, Yueh catheter was introduced. An ultrasound image was saved for documentation purposed. The paracentesis was performed. The catheter was removed and a dressing was applied. The patient tolerated the procedure well without immediate post procedural complication.  FINDINGS: A total of approximately 4.5 liters of slightly turbid, yellow fluid was removed. Samples were sent  to the laboratory as requested by the clinical team.  IMPRESSION: Successful ultrasound-guided diagnostic and therapeutic paracentesis yielding 4.5 liters of peritoneal fluid.  Read by: Rowe Robert, PA-C   Electronically Signed   By: Corrie Mckusick D.O.   On: 04/26/2014 15:32    Microbiology: Recent Results (from the past 240 hour(s))  Body fluid culture     Status: None (Preliminary result)   Collection Time: 04/26/14  3:09 PM  Result Value Ref Range Status   Specimen Description PERITONEAL CAVITY  Final   Special Requests Normal  Final   Gram Stain   Final    FEW WBC PRESENT, PREDOMINANTLY MONONUCLEAR NO ORGANISMS SEEN Performed at [facility]    Culture   Final    NO GROWTH 1 DAY Performed at [facility]    Report Status PENDING  Incomplete     Labs: Basic Metabolic Panel:  Recent Labs Lab 04/23/14 1624 04/24/14 0845 04/25/14 0434 04/26/14 0810 04/27/14 0453  NA 135* 134* 137 135* 139  K 4.1 4.3 4.2 3.9 4.2  CL 95* 96 99 98 100  CO2 23 26 26 25 26   GLUCOSE 113* 91 88 144* 113*  BUN 18 17 15 12 13   CREATININE 0.97 0.99 0.98 0.90 0.92  CALCIUM 9.1 8.7 8.7 8.8 8.7  MG  --  2.2 2.3  --   --    Liver Function Tests:  Recent Labs Lab 04/23/14 1624 04/24/14 0845 04/25/14 0434 04/27/14 0453  AST 22 16 17 18   ALT 14 11 10 9   ALKPHOS 101 87 82 101  BILITOT 0.5 0.4 0.5 0.5  PROT 7.4 6.4 6.3 6.0  ALBUMIN 2.8* 2.4* 2.3* 2.1*    Recent Labs Lab 04/23/14 1624  LIPASE 25   No results for input(s): AMMONIA in the last 168 hours. CBC:  Recent Labs Lab 04/23/14 1624 04/24/14 0845 04/25/14 0434 04/26/14 0810 04/27/14 0453  WBC 11.1* 9.2 9.2 9.8 10.3  NEUTROABS 8.0* 6.7 6.2  --   --   HGB 12.5* 11.3* 11.4* 11.6* 11.0*  HCT 38.6* 36.4* 37.9* 38.0* 36.3*  MCV 75.5* 76.5* 78.6 77.9* 78.2  PLT 579* 539* 632* 570* 570*   Cardiac Enzymes: No results for input(s): CKTOTAL, CKMB, CKMBINDEX, TROPONINI in the last 168 hours. BNP: BNP (last 3  results) No results for input(s): PROBNP in the last 8760 hours. CBG:  Recent Labs Lab 04/24/14 0812 04/25/14 0746 04/26/14 0823 04/27/14 0737  GLUCAP 95 79 103* 91       Signed:  Makinley Muscato MD [facility] 04/27/2014, 6:28 PM

## 2014-04-29 ENCOUNTER — Telehealth: Payer: Self-pay | Admitting: Hematology

## 2014-04-29 ENCOUNTER — Other Ambulatory Visit: Payer: Self-pay | Admitting: Hematology

## 2014-04-29 ENCOUNTER — Other Ambulatory Visit: Payer: Self-pay | Admitting: *Deleted

## 2014-04-29 ENCOUNTER — Telehealth: Payer: Self-pay | Admitting: *Deleted

## 2014-04-29 DIAGNOSIS — C169 Malignant neoplasm of stomach, unspecified: Secondary | ICD-10-CM

## 2014-04-29 DIAGNOSIS — C799 Secondary malignant neoplasm of unspecified site: Secondary | ICD-10-CM

## 2014-04-29 NOTE — Telephone Encounter (Signed)
Spoke with Langley Gauss @ [facility] Cytology dept, and asked her to fax results of paracentesis fluid to nurses's direct fax when available.  Murriel Hopper fax number.

## 2014-04-29 NOTE — Telephone Encounter (Signed)
Lft msg for pt confirming labs/ov/FC per 12/21 POF, D/T per MD, mailed out schedule to pt..... KJ

## 2014-04-30 ENCOUNTER — Other Ambulatory Visit: Payer: Self-pay | Admitting: *Deleted

## 2014-04-30 ENCOUNTER — Other Ambulatory Visit: Payer: Self-pay | Admitting: Radiology

## 2014-04-30 ENCOUNTER — Telehealth: Payer: Self-pay | Admitting: *Deleted

## 2014-04-30 LAB — BODY FLUID CULTURE
CULTURE: NO GROWTH
SPECIAL REQUESTS: NORMAL

## 2014-04-30 NOTE — Telephone Encounter (Signed)
Per 12/22 POF added chemo edu class, sent msg to MD had to sch at 10am on 12/28 that the 12:30 was for 1 on 1 interpreter, s/w pt confirmed all visits for 12/28.... KJ

## 2014-04-30 NOTE — Telephone Encounter (Signed)
Mom called reporting "Samuel Durham has not had a BM since 04-22-2014.  He is passing gas but no BM.  He is taking stool softeners daily.  Is it okay for him to take dulcolax?"  Reviewed 64 oz plus water intake, walking, drinking hot soups and beverages.  Laxatives are required daily for people taking pain medicines to regulate bowels.  OTC medications for bowels are okay to take and can try dulcolax, Mira lax, senokot-S, or whatever works for his body.  Explained that if his abdomen is swollen, firm and uncomfortable to go to the ER.

## 2014-05-01 ENCOUNTER — Telehealth: Payer: Self-pay | Admitting: *Deleted

## 2014-05-01 ENCOUNTER — Other Ambulatory Visit: Payer: Self-pay | Admitting: *Deleted

## 2014-05-01 DIAGNOSIS — C786 Secondary malignant neoplasm of retroperitoneum and peritoneum: Secondary | ICD-10-CM

## 2014-05-01 DIAGNOSIS — C801 Malignant (primary) neoplasm, unspecified: Secondary | ICD-10-CM

## 2014-05-01 DIAGNOSIS — C169 Malignant neoplasm of stomach, unspecified: Secondary | ICD-10-CM

## 2014-05-01 DIAGNOSIS — C799 Secondary malignant neoplasm of unspecified site: Secondary | ICD-10-CM

## 2014-05-01 LAB — TOTAL BILIRUBIN, BODY FLUID: TOTBILIFLUID: 0.3 mg/dL

## 2014-05-01 NOTE — Telephone Encounter (Signed)
Spoke with pt and informed pt that Dr. Burr Medico would like to order CT Chest without contrast to complete staging.  Informed pt that scan can be done at 8am 05/02/14 prior to port insertion at 9am.  Pt agreed and voiced understanding.

## 2014-05-02 ENCOUNTER — Encounter (HOSPITAL_COMMUNITY): Payer: Self-pay

## 2014-05-02 ENCOUNTER — Ambulatory Visit (HOSPITAL_COMMUNITY)
Admission: RE | Admit: 2014-05-02 | Discharge: 2014-05-02 | Disposition: A | Discharge status: Home / Self Care | Payer: Medicaid Other | Source: Ambulatory Visit | Attending: Hematology | Admitting: Hematology

## 2014-05-02 ENCOUNTER — Other Ambulatory Visit: Payer: Self-pay | Admitting: Hematology

## 2014-05-02 DIAGNOSIS — C169 Malignant neoplasm of stomach, unspecified: Secondary | ICD-10-CM

## 2014-05-02 DIAGNOSIS — C786 Secondary malignant neoplasm of retroperitoneum and peritoneum: Secondary | ICD-10-CM | POA: Diagnosis not present

## 2014-05-02 DIAGNOSIS — R180 Malignant ascites: Secondary | ICD-10-CM | POA: Diagnosis not present

## 2014-05-02 DIAGNOSIS — J90 Pleural effusion, not elsewhere classified: Secondary | ICD-10-CM | POA: Insufficient documentation

## 2014-05-02 DIAGNOSIS — C801 Malignant (primary) neoplasm, unspecified: Secondary | ICD-10-CM

## 2014-05-02 DIAGNOSIS — C799 Secondary malignant neoplasm of unspecified site: Secondary | ICD-10-CM

## 2014-05-02 MED ORDER — LIDOCAINE HCL 1 % IJ SOLN
INTRAMUSCULAR | Status: AC
Start: 2014-05-02 — End: 2014-05-02
  Filled 2014-05-02: qty 20

## 2014-05-02 MED ORDER — SODIUM CHLORIDE 0.9 % IV SOLN: INTRAVENOUS | Status: DC

## 2014-05-02 MED ORDER — HEPARIN SOD (PORK) LOCK FLUSH 100 UNIT/ML IV SOLN
INTRAVENOUS | Status: AC
Start: 2014-05-02 — End: 2014-05-02
  Filled 2014-05-02: qty 5

## 2014-05-02 MED ORDER — MIDAZOLAM HCL 2 MG/2ML IJ SOLN
INTRAMUSCULAR | Status: AC | PRN
  Administered 2014-05-02 (×4): 1 mg via INTRAVENOUS

## 2014-05-02 MED ORDER — MIDAZOLAM HCL 2 MG/2ML IJ SOLN
INTRAMUSCULAR | Status: AC
  Filled 2014-05-02: qty 4

## 2014-05-02 MED ORDER — FENTANYL CITRATE 0.05 MG/ML IJ SOLN
INTRAMUSCULAR | Status: AC | PRN
  Administered 2014-05-02 (×4): 50 ug via INTRAVENOUS

## 2014-05-02 MED ORDER — DEXTROSE 5 % IV SOLN
3.0000 g | Freq: Once | INTRAVENOUS | Status: AC
  Administered 2014-05-02: 11:00:00 via INTRAVENOUS
  Filled 2014-05-02: qty 3000

## 2014-05-02 MED ORDER — LIDOCAINE-EPINEPHRINE 2 %-1:100000 IJ SOLN
INTRAMUSCULAR | Status: AC
  Filled 2014-05-02: qty 1

## 2014-05-02 MED ORDER — CEFAZOLIN SODIUM-DEXTROSE 2-3 GM-% IV SOLR
INTRAVENOUS | Status: AC
Start: 2014-05-02 — End: 2014-05-02
  Filled 2014-05-02: qty 50

## 2014-05-02 MED ORDER — FENTANYL CITRATE 0.05 MG/ML IJ SOLN
INTRAMUSCULAR | Status: AC
  Filled 2014-05-02: qty 4

## 2014-05-02 NOTE — Procedures (Signed)
Interventional Radiology Procedure Note  Procedure:  1,) Placement of a right IJ approach single lumen PowerPort.  Tip is positioned at the superior cavoatrial junction and catheter is ready for immediate use.  2.) Paracentesis Complications: No immediate Recommendations:  - Ok to shower tomorrow - Do not submerge for 7 days - Routine line care   Signed,  Criselda Peaches, MD

## 2014-05-02 NOTE — Discharge Instructions (Addendum)
Implanted Port Home Guide °An implanted port is a type of central line that is placed under the skin. Central lines are used to provide IV access when treatment or nutrition needs to be given through a person's veins. Implanted ports are used for long-term IV access. An implanted port may be placed because:  °· You need IV medicine that would be irritating to the small veins in your hands or arms.   °· You need long-term IV medicines, such as antibiotics.   °· You need IV nutrition for a long period.   °· You need frequent blood draws for lab tests.   °· You need dialysis.   °Implanted ports are usually placed in the chest area, but they can also be placed in the upper arm, the abdomen, or the leg. An implanted port has two main parts:  °· Reservoir. The reservoir is round and will appear as a small, raised area under your skin. The reservoir is the part where a needle is inserted to give medicines or draw blood.   °· Catheter. The catheter is a thin, flexible tube that extends from the reservoir. The catheter is placed into a large vein. Medicine that is inserted into the reservoir goes into the catheter and then into the vein.   °HOW WILL I CARE FOR MY INCISION SITE? °Do not get the incision site wet. Bathe or shower as directed by your health care provider.  °HOW IS MY PORT ACCESSED? °Special steps must be taken to access the port:  °· Before the port is accessed, a numbing cream can be placed on the skin. This helps numb the skin over the port site.   °· Your health care provider uses a sterile technique to access the port. °· Your health care provider must put on a mask and sterile gloves. °· The skin over your port is cleaned carefully with an antiseptic and allowed to dry. °· The port is gently pinched between sterile gloves, and a needle is inserted into the port. °· Only "non-coring" port needles should be used to access the port. Once the port is accessed, a blood return should be checked. This helps  ensure that the port is in the vein and is not clogged.   °· If your port needs to remain accessed for a constant infusion, a clear (transparent) bandage will be placed over the needle site. The bandage and needle will need to be changed every week, or as directed by your health care provider.   °· Keep the bandage covering the needle clean and dry. Do not get it wet. Follow your health care provider's instructions on how to take a shower or bath while the port is accessed.   °· If your port does not need to stay accessed, no bandage is needed over the port.   °WHAT IS FLUSHING? °Flushing helps keep the port from getting clogged. Follow your health care provider's instructions on how and when to flush the port. Ports are usually flushed with saline solution or a medicine called heparin. The need for flushing will depend on how the port is used.  °· If the port is used for intermittent medicines or blood draws, the port will need to be flushed:   °· After medicines have been given.   °· After blood has been drawn.   °· As part of routine maintenance.   °· If a constant infusion is running, the port may not need to be flushed.   °HOW LONG WILL MY PORT STAY IMPLANTED? °The port can stay in for as long as your health care   provider thinks it is needed. When it is time for the port to come out, surgery will be done to remove it. The procedure is similar to the one performed when the port was put in.  WHEN SHOULD I SEEK IMMEDIATE MEDICAL CARE? When you have an implanted port, you should seek immediate medical care if:   You notice a bad smell coming from the incision site.   You have swelling, redness, or drainage at the incision site.   You have more swelling or pain at the port site or the surrounding area.   You have a fever that is not controlled with medicine. Document Released: 04/26/2005 Document Revised: 02/14/2013 Document Reviewed: 01/01/2013 [facility] Patient Information 2015 [place], [place]. This  information is not intended to replace advice given to you by your health care provider. Make sure you discuss any questions you have with your health care provider. Paracentesis Paracentesis is a procedure used to remove excess fluid from the belly (abdomen). Excess fluid in the belly is called ascites. Excess fluid can be the result of certain conditions, such as infection, inflammation, abdominal injury, heart failure, chronic scarring of the liver (cirrhosis), or cancer. The excess fluid is removed using a needle inserted through the skin and tissue into the abdomen.  A paracentesis may be done to:  Determine the cause of the excess fluid through examination of the fluid.  Relieve symptoms of shortness of breath or pain caused by the excess fluid.  Determine presence of bleeding after an abdominal injury. LET YOUR CAREGIVERS KNOW ABOUT:  Allergies.  Medications taken including herbs, eye drops, over-the-counter medications, and creams.  Use of steroids (by mouth or creams).  Previous problems with anesthetics or numbing medicine.  Possibility of pregnancy, if this applies.  History of blood clots (thrombophlebitis).  History of bleeding or blood problems.  Previous surgery.  Other health problems. RISKS AND COMPLICATIONS  Injury to an abdominal organ, such as the bowel (large intestine), liver, spleen, or bladder.  Possible infection.  Bleeding.  Low blood pressure (hypotension). BEFORE THE PROCEDURE This is a procedure that can be done as an outpatient. Confirm the time that you need to arrive for your procedure. A blood sample may be done to determine your blood clotting time. The presence of a severe bleeding disorder (coagulopathy) which cannot be promptly corrected may make this procedure inadvisable. You may be asked to urinate. PROCEDURE The procedure will take about 30 minutes. This time will vary depending on the amount of fluid that is removed. You may be asked  to lie on your back with your head elevated. An area on your abdomen will be cleansed. A numbing medicine may then be injected (local anesthesia) into the skin and tissue. A needle is inserted through your abdominal skin and tissues until it is positioned in your abdomen. You may feel pressure or slight pain as the needle is positioned into the abdomen. Fluid is removed from the abdomen through the needle. Tell your caregiver if you feel dizzy or lightheaded. The needle is withdrawn once the desired amount of fluid has been removed. A sample of the fluid may be sent for examination.  AFTER THE PROCEDURE Your recovery will be assessed and monitored. If there are no problems, as an outpatient, you should be able to go home shortly after the procedure. There may be a very limited amount of clear fluid draining from the needle insertion site over the next 2 days. Confirm with your caregiver as to the  expected amount of drainage. Obtaining the Test Results It is your responsibility to obtain your test results. Do not assume everything is normal if you have not heard from your caregiver or the medical facility. It is important for you to follow up on all of your test results. HOME CARE INSTRUCTIONS   You may resume normal diet and activities as directed or allowed.  Only take over-the-counter or prescription medicines for pain, discomfort, or fever as directed by your caregiver. SEEK IMMEDIATE MEDICAL CARE IF:  You develop shortness of breath or chest pain.  You develop increasing pain, discomfort, or swelling in your abdomen.  You develop new drainage or pus coming from site where fluid was removed.  You develop swelling or increased redness from site where fluid was removed.  You develop an unexplained temperature of 102 F (38.9 C) or above. Document Released: 11/09/2004 Document Revised: 07/19/2011 Document Reviewed: 12/16/2008 [facility] Patient Information 2015 [place], [place]. This information  is not intended to replace advice given to you by your health care provider. Make sure you discuss any questions you have with your health care provider.

## 2014-05-02 NOTE — Sedation Documentation (Signed)
Port a cath complete, now assessing for paracentesis.  Tolerated port well

## 2014-05-02 NOTE — H&P (Signed)
Chief Complaint: "I am here for a port and to have fluid removed from my abdomen."  Referring Physician(s): Feng,Yan  History of Present Illness: Samuel Durham is a 38 y.o. male with gastric adenocarcinoma seen by Dr. Burr Medico and scheduled for a port today. He also has abdominal distention s/p paracentesis 04/26/14 of 4.5 liters and will also have a paracentesis today. He denies any chest pain, shortness of breath or palpitations. He denies any active signs of bleeding or excessive bruising. He denies any recent fever or chills. The patient denies any history of sleep apnea or chronic oxygen use. He has previously tolerated sedation without complications.    Past Medical History  Diagnosis Date  . Medical history non-contributory     Past Surgical History  Procedure Laterality Date  . Squamous cell carcinoma excision  approx March 2015    removed from top of head  . Esophagogastroduodenoscopy (egd) with propofol N/A 04/25/2014    Procedure: ESOPHAGOGASTRODUODENOSCOPY (EGD) WITH PROPOFOL;  Surgeon: Milus Banister, MD;  Location: [facility];  Service: Endoscopy;  Laterality: N/A;    Allergies: Review of patient's allergies indicates no known allergies.  Medications: Prior to Admission medications   Medication Sig Start Date End Date Taking? Authorizing Provider  morphine (MS CONTIN) 15 MG 12 hr tablet Take 1 tablet (15 mg total) by mouth every 12 (twelve) hours. 04/27/14  Yes Eugenie Filler, MD  ondansetron (ZOFRAN) 4 MG tablet Take 1 tablet (4 mg total) by mouth every 8 (eight) hours as needed for nausea or vomiting. 04/27/14  Yes Eugenie Filler, MD  pantoprazole (PROTONIX) 40 MG tablet Take 1 tablet (40 mg total) by mouth daily. 04/27/14  Yes Eugenie Filler, MD  traMADol (ULTRAM) 50 MG tablet Take 1-2 tablets (50-100 mg total) by mouth every 6 (six) hours as needed for moderate pain. 04/27/14  Yes Eugenie Filler, MD  docusate sodium 100 MG CAPS Take 100 mg by mouth 2  (two) times daily. Patient not taking: Reported on 04/30/2014 04/27/14   Eugenie Filler, MD    Family History  Problem Relation Age of Onset  . Breast cancer Sister   . Hypertension Mother   . Hypertension Father     History   Social History  . Marital Status: Single    Spouse Name: N/A    Number of Children: N/A  . Years of Education: N/A   Social History Main Topics  . Smoking status: Never Smoker   . Smokeless tobacco: Never Used  . Alcohol Use: No  . Drug Use: No  . Sexual Activity: None   Other Topics Concern  . None   Social History Narrative   Review of Systems: A 12 point ROS discussed and pertinent positives are indicated in the HPI above.  All other systems are negative.  Review of Systems  Vital Signs: BP 132/87 mmHg  Pulse 67  Temp(Src) 98.5 F (36.9 C)  Ht 5\' 9"  (1.753 m)  Wt 287 lb (130.182 kg)  BMI 42.36 kg/m2  SpO2 99%  Physical Exam  Constitutional: He is oriented to person, place, and time.  HENT:  Head: Normocephalic and atraumatic.  Neck: No tracheal deviation present.  Cardiovascular: Normal rate and regular rhythm.  Exam reveals no gallop and no friction rub.   No murmur heard. Pulmonary/Chest: Effort normal and breath sounds normal. No respiratory distress. He has no wheezes. He has no rales.  Abdominal: Soft. He exhibits distension.  Neurological: He is alert  and oriented to person, place, and time.    Imaging: Ct Chest Wo Contrast  05/02/2014   CLINICAL DATA:  Newly diagnosed gastric cancer with peritoneal carcinomatosis, to start chemotherapy  EXAM: CT CHEST WITHOUT CONTRAST  TECHNIQUE: Multidetector CT imaging of the chest was performed following the standard protocol without IV contrast.  COMPARISON:  CT abdomen pelvis dated 04/23/2014  FINDINGS: Multiple bilateral pulmonary nodules/metastases, including:  --6 mm nodule in the left lower lobe (series 5/ image 21)  --8 mm nodule in the left lower lobe (series 5/ image 21)  --10  mm nodule in the right lower lobe (series 5/ image 32)  Small bilateral pleural effusions.  No pneumothorax.  Visualized thyroid is unremarkable.  The heart is normal in size.  No pericardial effusion.  11 mm short axis lower paraesophageal node (series 2/image 39). 6 mm short axis right juxta diaphragmatic node (series 2/image 36), indeterminate.  Visualized upper abdomen is notable for a mass at the GE junction, multiple large upper abdominal nodes, peritoneal carcinomatosis, upper abdominal ascites, and layering gallstones, unchanged from recent CT.  IMPRESSION: Multiple bilateral pulmonary nodules/metastases, measuring up to 10 mm in the right lower lobe.  11 mm short axis lower paraesophageal node, suspicious for nodal metastasis.  Small bilateral pleural effusions.  Gastric adenocarcinoma with upper abdominal lymphadenopathy and peritoneal carcinomatosis, incompletely visualized.   Electronically Signed   By: Julian Hy M.D.   On: 05/02/2014 08:43   Ct Abdomen Pelvis W Contrast  04/23/2014   CLINICAL DATA:  38 year old male with epigastric pain, vomiting and dysphagia for the past several months.  EXAM: CT ABDOMEN AND PELVIS WITH CONTRAST  TECHNIQUE: Multidetector CT imaging of the abdomen and pelvis was performed using the standard protocol following bolus administration of intravenous contrast.  CONTRAST:  191mL OMNIPAQUE IOHEXOL 300 MG/ML  SOLN  COMPARISON:  No priors.  FINDINGS: Lower chest: Several pulmonary nodules are noted in the visualized lung bases bilaterally, the largest of which measures up to 1 cm in the right lower lobe (image 12 of series 3). Profound thickening of the distal esophagus extending beyond the gastroesophageal junction into the proximal stomach.  Hepatobiliary: No cystic or solid hepatic lesions. No intra or extrahepatic biliary ductal dilatation. Gallbladder is remarkable for some amorphous increased attenuation dependently, likely to reflect some biliary sludge.   Pancreas: Fatty atrophy in the pancreas.  Otherwise, unremarkable.  Spleen: Unremarkable.  Adrenals/Urinary Tract: Bilateral adrenal glands and the right kidney are unremarkable in appearance. Exophytic 2.4 cm low-attenuation lesion in the anterior aspect of the upper pole of the left kidney is compatible with a simple cyst. No hydroureteronephrosis. Urinary bladder is normal in appearance.  Stomach/Bowel: Marked thickening of the gastric wall at the gastroesophageal junction, extending into the cardia and fundus of the stomach, most evident along the lesser curvature. This is contiguous with the previously mentioned esophageal wall thickening. No pathologic dilatation of small bowel or colon.  Vascular/Lymphatic: Extensive upper abdominal lymphadenopathy, predominantly in the gastrohepatic ligament and celiac axis nodal stations, with the largest single lymph node measuring up to 3.7 x 3.0 cm in the celiac axis nodal station (image 27 of series 2). Portacaval lymphadenopathy measuring up to 1.7 cm. Notably, many of the enlarged celiac axis lymph nodes appear partially calcified, which could suggest a mucinous subtype of neoplasm. Enlarged left paraaortic retroperitoneal lymph nodes measuring up to 2.4 x 4.2 cm adjacent to the left renal vein (image 33 of series 2). No significant atherosclerotic disease in the  abdominal or pelvic vasculature.  Reproductive: Prostate gland and seminal vesicles are unremarkable in appearance.  Other: Moderate volume of ascites, presumably malignant. Extensive soft tissue nodularity throughout the omentum, compatible with omental caking and widespread peritoneal metastasis. Additionally, there are other areas of enhancing nodularity along the peritoneal surface. Tiny ventral and umbilical hernias containing predominantly omental fat, including some omental implants. No pneumoperitoneum.  Musculoskeletal: There are no aggressive appearing lytic or blastic lesions noted in the  visualized portions of the skeleton.  IMPRESSION: 1. Findings, as above, highly concerning for either esophageal or gastric primary malignancy, with metastatic lymphadenopathy in the upper abdomen and retroperitoneum, widespread intraperitoneal spread of disease, malignant ascites, and multiple pulmonary nodules in the visualize lung bases concerning for pulmonary metastasis. Oncologic evaluation is recommended. 2. Additional incidental findings, as above. These results were called by telephone at the time of interpretation on 04/23/2014 at 7:53 pm to [place] , who verbally acknowledged these results.   Electronically Signed   By: Vinnie Langton M.D.   On: 04/23/2014 19:56   US Paracentesis  04/26/2014   INDICATION: GE junction mass, lymphadenopathy, ascites. Request is made for diagnostic and therapeutic paracentesis.  EXAM: ULTRASOUND-GUIDED DIAGNOSTIC AND THERAPEUTIC PARACENTESIS  COMPARISON:  None.  MEDICATIONS: None.  COMPLICATIONS: None immediate  TECHNIQUE: Informed written consent was obtained from the patient after a discussion of the risks, benefits and alternatives to treatment. A timeout was performed prior to the initiation of the procedure.  Initial ultrasound scanning demonstrates a large amount of ascites within the right mid to lower abdominal quadrant. The right lmid to lower abdomen was prepped and draped in the usual sterile fashion. 1% lidocaine was used for local anesthesia. Under direct ultrasound guidance, a 19 gauge, 10-cm, Yueh catheter was introduced. An ultrasound image was saved for documentation purposed. The paracentesis was performed. The catheter was removed and a dressing was applied. The patient tolerated the procedure well without immediate post procedural complication.  FINDINGS: A total of approximately 4.5 liters of slightly turbid, yellow fluid was removed. Samples were sent to the laboratory as requested by the clinical team.  IMPRESSION: Successful  ultrasound-guided diagnostic and therapeutic paracentesis yielding 4.5 liters of peritoneal fluid.  Read by: Rowe Robert, PA-C   Electronically Signed   By: Corrie Mckusick D.O.   On: 04/26/2014 15:32    Labs:  CBC:  Recent Labs  04/24/14 0845 04/25/14 0434 04/26/14 0810 04/27/14 0453  WBC 9.2 9.2 9.8 10.3  HGB 11.3* 11.4* 11.6* 11.0*  HCT 36.4* 37.9* 38.0* 36.3*  PLT 539* 632* 570* 570*    COAGS:  Recent Labs  04/23/14 2305 04/26/14 0810  INR 1.18 1.23  APTT 31  --     BMP:  Recent Labs  04/24/14 0845 04/25/14 0434 04/26/14 0810 04/27/14 0453  NA 134* 137 135* 139  K 4.3 4.2 3.9 4.2  CL 96 99 98 100  CO2 26 26 25 26   GLUCOSE 91 88 144* 113*  BUN 17 15 12 13   CALCIUM 8.7 8.7 8.8 8.7  CREATININE 0.99 0.98 0.90 0.92  GFRNONAA >90 >90 >90 >90  GFRAA >90 >90 >90 >90    LIVER FUNCTION TESTS:  Recent Labs  04/23/14 1624 04/24/14 0845 04/25/14 0434 04/27/14 0453  BILITOT 0.5 0.4 0.5 0.5  AST 22 16 17 18   ALT 14 11 10 9   ALKPHOS 101 87 82 101  PROT 7.4 6.4 6.3 6.0  ALBUMIN 2.8* 2.4* 2.3* 2.1*    TUMOR MARKERS:  Recent Labs  04/25/14 0434  CEA 5.6*  CA199 13.0*    Assessment and Plan: Gastric adenocarcinoma, ascites Seen by Dr. Burr Medico in office on 04/26/14 Scheduled today for image guided port a catheter placement and paracentesis with moderate sedation Patient has been NPO, no blood thinners taken, labs reviewed, afebrile. Risks and Benefits discussed with the patient. All of the patient's questions were answered, patient is agreeable to proceed. Consent signed and in chart.     SignedHedy Jacob 05/02/2014, 10:00 AM

## 2014-05-06 ENCOUNTER — Ambulatory Visit (HOSPITAL_BASED_OUTPATIENT_CLINIC_OR_DEPARTMENT_OTHER): Payer: Medicaid Other | Admitting: Hematology

## 2014-05-06 ENCOUNTER — Telehealth: Payer: Self-pay | Admitting: *Deleted

## 2014-05-06 ENCOUNTER — Other Ambulatory Visit: Payer: Self-pay | Admitting: *Deleted

## 2014-05-06 ENCOUNTER — Encounter: Payer: Self-pay | Admitting: Hematology

## 2014-05-06 ENCOUNTER — Telehealth: Payer: Self-pay | Admitting: Hematology

## 2014-05-06 ENCOUNTER — Ambulatory Visit: Payer: Self-pay

## 2014-05-06 ENCOUNTER — Other Ambulatory Visit (HOSPITAL_BASED_OUTPATIENT_CLINIC_OR_DEPARTMENT_OTHER): Payer: Medicaid Other

## 2014-05-06 VITALS — BP 134/92 | HR 103 | Temp 97.8°F | Resp 21 | Ht 69.0 in | Wt 279.8 lb

## 2014-05-06 DIAGNOSIS — E46 Unspecified protein-calorie malnutrition: Secondary | ICD-10-CM

## 2014-05-06 DIAGNOSIS — C799 Secondary malignant neoplasm of unspecified site: Secondary | ICD-10-CM

## 2014-05-06 DIAGNOSIS — C7802 Secondary malignant neoplasm of left lung: Secondary | ICD-10-CM

## 2014-05-06 DIAGNOSIS — C169 Malignant neoplasm of stomach, unspecified: Secondary | ICD-10-CM

## 2014-05-06 DIAGNOSIS — R110 Nausea: Secondary | ICD-10-CM

## 2014-05-06 DIAGNOSIS — D649 Anemia, unspecified: Secondary | ICD-10-CM

## 2014-05-06 DIAGNOSIS — C786 Secondary malignant neoplasm of retroperitoneum and peritoneum: Secondary | ICD-10-CM

## 2014-05-06 DIAGNOSIS — C7801 Secondary malignant neoplasm of right lung: Secondary | ICD-10-CM

## 2014-05-06 DIAGNOSIS — R630 Anorexia: Secondary | ICD-10-CM

## 2014-05-06 DIAGNOSIS — R180 Malignant ascites: Secondary | ICD-10-CM

## 2014-05-06 LAB — COMPREHENSIVE METABOLIC PANEL (CC13)
ALBUMIN: 2.3 g/dL — AB (ref 3.5–5.0)
ALT: 29 U/L (ref 0–55)
AST: 46 U/L — ABNORMAL HIGH (ref 5–34)
Alkaline Phosphatase: 137 U/L (ref 40–150)
Anion Gap: 12 mEq/L — ABNORMAL HIGH (ref 3–11)
BILIRUBIN TOTAL: 0.48 mg/dL (ref 0.20–1.20)
BUN: 21.2 mg/dL (ref 7.0–26.0)
CO2: 28 mEq/L (ref 22–29)
Calcium: 9.2 mg/dL (ref 8.4–10.4)
Chloride: 97 mEq/L — ABNORMAL LOW (ref 98–109)
Creatinine: 1.1 mg/dL (ref 0.7–1.3)
EGFR: 80 mL/min/{1.73_m2} — AB (ref >90)
Glucose: 140 mg/dl (ref 70–140)
Potassium: 4.4 mEq/L (ref 3.5–5.1)
Sodium: 136 mEq/L (ref 136–145)
Total Protein: 6.8 g/dL (ref 6.4–8.3)

## 2014-05-06 LAB — CBC WITH DIFFERENTIAL/PLATELET
BASO%: 0.1 % (ref 0.0–2.0)
Basophils Absolute: 0 10*3/uL (ref 0.0–0.1)
EOS%: 0.2 % (ref 0.0–7.0)
Eosinophils Absolute: 0 10*3/uL (ref 0.0–0.5)
HCT: 39.3 % (ref 38.4–49.9)
HGB: 12.5 g/dL — ABNORMAL LOW (ref 13.0–17.1)
LYMPH#: 1.1 10*3/uL (ref 0.9–3.3)
LYMPH%: 7.8 % — ABNORMAL LOW (ref 14.0–49.0)
MCH: 23.7 pg — ABNORMAL LOW (ref 27.2–33.4)
MCHC: 31.8 g/dL — AB (ref 32.0–36.0)
MCV: 74.6 fL — ABNORMAL LOW (ref 79.3–98.0)
MONO#: 1.2 10*3/uL — ABNORMAL HIGH (ref 0.1–0.9)
MONO%: 8.2 % (ref 0.0–14.0)
NEUT%: 83.7 % — AB (ref 39.0–75.0)
NEUTROS ABS: 11.9 10*3/uL — AB (ref 1.5–6.5)
Platelets: 614 10*3/uL — ABNORMAL HIGH (ref 140–400)
RBC: 5.27 10*6/uL (ref 4.20–5.82)
RDW: 15.3 % — ABNORMAL HIGH (ref 11.0–14.6)
WBC: 14.3 10*3/uL — ABNORMAL HIGH (ref 4.0–10.3)

## 2014-05-06 LAB — OTHER BODY FLUID CHEMISTRY

## 2014-05-06 MED ORDER — LIDOCAINE-PRILOCAINE 2.5-2.5 % EX CREA: 1.0000 "application " | TOPICAL_CREAM | CUTANEOUS | Status: DC | PRN

## 2014-05-06 MED ORDER — MIRTAZAPINE 15 MG PO TABS: 15.0000 mg | ORAL_TABLET | Freq: Every day | ORAL | Status: DC

## 2014-05-06 MED ORDER — PROCHLORPERAZINE MALEATE 10 MG PO TABS: 10.0000 mg | ORAL_TABLET | Freq: Four times a day (QID) | ORAL | Status: DC | PRN

## 2014-05-06 MED ORDER — MIRTAZAPINE 15 MG PO TABS: 15.0000 mg | ORAL_TABLET | Freq: Every day | ORAL | Status: AC

## 2014-05-06 NOTE — Telephone Encounter (Signed)
Gave avs & cal for Jan/Feb. Sent mess to sch

## 2014-05-06 NOTE — Telephone Encounter (Signed)
Per staff message and POF I have scheduled appts. Advised scheduler of appts. JMW  

## 2014-05-06 NOTE — Progress Notes (Signed)
[place]  [phone] [phone]  Clinic New Consult Note   No care team member to display 05/06/2014  CHIEF COMPLAINTS/PURPOSE OF CONSULTATION:  Newly diagnosed metastatic gastric cancer    Gastric adenocarcinoma: Stage IV   04/23/2014 Imaging CT abdomen showed peritoneal carcinomatosis, ascites, thickning of gastric and GEJ wall, CT chest showed a few small b/l lung nodules.    04/24/2014 Tumor Marker CEA  5.6, CA19.9 13   04/25/2014 Initial Diagnosis Gastric adenocarcinoma: Stage IV   04/25/2014 Pathology Results Stomach mass biopsy showed - ADENOCARCINOMA WITH SIGNET RING FEATURES. ascites cytology (+)   04/25/2014 Procedure EGD: There was a large, nearly circumferential, ulcerated, clearly malignant mass that stradles the GE junction. The vast bulk of the mass lays in the stomach.      HISTORY OF PRESENTING ILLNESS:  Samuel Durham 38 y.o. 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 I saw him first when he was recently admitted to [facility]. He is here for the first office visit after hospital discharge.  He was  admitted on 12/15 with progressive, 4 month history of dysphagia, epigastric abdominal discomfort and bloating, early satiation accompanied by nausea, and postprandial vomiting and diarrhea. He has experienced a 30 lbs weight loss in the process. He has noted increased abdominal girth. Patient denies any shortness of breath, chest pain, GI bleed, hematuria or lower extremity swelling. He denies heavy alcohol intake. Denies tobacco abuse. Denies risk factors for HIV or hepatitis. He denies any family history of GI malignancies.  CT of the abdomen and pelvis with contrast on 12/15 revealed several pulmonary nodules at the bases bilaterally, profound thickening of the distal esophagus extending beyond the gastroesophageal junction into the proximal stomach,cardia and fundus of the stomach, most  evident along the lesser curvature. No pathologic dilatation of small bowel or colon. Extensive upper abdominal and retroperitoneal lymphadenopathy was visualized. Moderate volume of ascites, presumably malignant, was noted. Extensive soft tissue nodularity throughout the omentum, compatible with omental caking and widespread peritoneal metastasis. Additionally, there are other areas of enhancing nodularity along the peritoneal surface. Tiny ventral and umbilical hernias containing predominantly omental fat, including some omental implants. There are no aggressive appearing lytic or blastic lesions noted in the visualized portions of the skeleton.  He underwent an EGD by Dr. Ardis Hughs on 04/25/2014, which showed a large fungating mass that straddles the GE junction and occupying the upper 1/3 part of the stomach. Multiple biopsy was taken which showed adenocarcinoma with signet ring features. He also underwent a paracentesis with 4.5 L fluid is removed on 04/26/2014. Cytology was positive for adenocarcinoma with signet ring features, consistent with metastatic disease.  He was discharged to home on 04/27/2014. He did feel a slightly informant of his abdominal bloating after the paracentesis, but not much improvement other symptoms. He had Mediport placement and the second paracentesis was again 4 L fluids removal on 05/02/2014. His appetite is low, he drinks ensure 3 bottles a day, some soup and a very little other foods. His energy level is also low, but able to take care of all his daily needs, and move around with about much difficulty. His abdominal discomfort/pain are relatively controlled by morphine 15 mg at night and tramadol 2-3 times during the day. He denies any fever, chills, cough, chest pain or any in the other discomfort.  MEDICAL HISTORY:  Past Medical History  Diagnosis Date  . Medical history non-contributory     SURGICAL HISTORY:  Past Surgical History  Procedure Laterality Date  .  Squamous cell carcinoma excision  approx March 2015    removed from top of head  . Esophagogastroduodenoscopy (egd) with propofol N/A 04/25/2014    Procedure: ESOPHAGOGASTRODUODENOSCOPY (EGD) WITH PROPOFOL;  Surgeon: Milus Banister, MD;  Location: [facility] END[facility]COPY;  Service: Endoscopy;  Laterality: N/A;    SOCIAL HISTORY: History   Social History  . Marital Status: Single    Spouse Name: N/A    Number of Children: N/A  . Years of Education: N/A   Occupational History  . Not on file.   Social History Main Topics  . Smoking status: Never Smoker   . Smokeless tobacco: Never Used  . Alcohol Use: No  . Drug Use: No  . Sexual Activity: Not on file   Other Topics Concern  . Not on file   Social History Narrative    FAMILY HISTORY: Family History  Problem Relation Age of Onset  . Breast cancer Sister 29   . Hypertension Mother   . Hypertension Father   Maternal aunt breast cancer at age of 38 Maternal cousin ovarian cancer at age of 4 Maternal ancle prostate cancer age of 23   ALLERGIES:  has No Known Allergies.  MEDICATIONS:  Current Outpatient Prescriptions  Medication Sig Dispense Refill  . morphine (MS CONTIN) 15 MG 12 hr tablet Take 1 tablet (15 mg total) by mouth every 12 (twelve) hours. 60 tablet 0  . ondansetron (ZOFRAN) 4 MG tablet Take 1 tablet (4 mg total) by mouth every 8 (eight) hours as needed for nausea or vomiting. 20 tablet 0  . pantoprazole (PROTONIX) 40 MG tablet Take 1 tablet (40 mg total) by mouth daily. 30 tablet 0  . traMADol (ULTRAM) 50 MG tablet Take 1-2 tablets (50-100 mg total) by mouth every 6 (six) hours as needed for moderate pain. 30 tablet 0  . docusate sodium 100 MG CAPS Take 100 mg by mouth 2 (two) times daily. (Patient not taking: Reported on 04/30/2014) 10 capsule 0  . lidocaine-prilocaine (EMLA) cream Apply 1 application topically as needed. 30 g 0  . mirtazapine (REMERON) 15 MG tablet Take 1 tablet (15 mg total) by mouth at bedtime. 30  tablet 1  . prochlorperazine (COMPAZINE) 10 MG tablet Take 1 tablet (10 mg total) by mouth every 6 (six) hours as needed for nausea or vomiting. 30 tablet 3   No current facility-administered medications for this visit.    REVIEW OF SYSTEMS:   Constitutional: Denies fevers, chills or abnormal night sweats, (+) weight loss and anorexia Eyes: Denies blurriness of vision, double vision or watery eyes Ears, nose, mouth, throat, and face: Denies mucositis or sore throat Respiratory: Denies cough, dyspnea or wheezes Cardiovascular: Denies palpitation, chest discomfort or lower extremity swelling Gastrointestinal:  (+) nausea, abdominal bloating and discomfort Skin: Denies abnormal skin rashes Lymphatics: Denies new lymphadenopathy or easy bruising Neurological:Denies numbness, tingling or new weaknesses Behavioral/Psych: Mood is stable, no new changes  All other systems were reviewed with the patient and are negative except those mentioned in the history.  PHYSICAL EXAMINATION: ECOG PERFORMANCE STATUS: 2 - Symptomatic, <50% confined to bed  Filed Vitals:   05/06/14 1219  BP: 134/92  Pulse: 103  Temp: 97.8 F (36.6 C)  Resp: 21   Filed Weights   05/06/14 1219  Weight: 279 lb 12.8 oz (126.916 kg)    GENERAL:alert, no distress and comfortable SKIN: skin color, texture, turgor are normal, no rashes or significant lesions  EYES: normal, conjunctiva are pink and non-injected, sclera clear OROPHARYNX:no exudate, no erythema and lips, buccal mucosa, and tongue normal  NECK: supple, thyroid normal size, non-tender, without nodularity LYMPH:  no palpable lymphadenopathy in the cervical, axillary or inguinal LUNGS: clear to auscultation and percussion with normal breathing effort HEART: regular rate & rhythm and no murmurs and no lower extremity edema ABDOMEN:abdomen soft, non-tender and normal bowel sounds Musculoskeletal:no cyanosis of digits and no clubbing  PSYCH: alert & oriented x 3  with fluent speech NEURO: no focal motor/sensory deficits  LABORATORY DATA:  I have reviewed the data as listed Lab Results  Component Value Date   WBC 14.3* 05/06/2014   HGB 12.5* 05/06/2014   HCT 39.3 05/06/2014   MCV 74.6* 05/06/2014   PLT 614* 05/06/2014    Recent Labs  04/25/14 0434 04/26/14 0810 04/27/14 0453 05/06/14 1202  NA 137 135* 139 136  K 4.2 3.9 4.2 4.4  CL 99 98 100  --   CO2 _0 GLUCOSE 88 144* 113* 140  BUN _1 21.2  CREATININE 0.98 0.90 0.92 1.1  CALCIUM 8.7 8.8 8.7 9.2  GFRNONAA >90 >90 >90  --   GFRAA >90 >90 >90  --   PROT 6.3  --  6.0 6.8  ALBUMIN 2.3*  --  2.1* 2.3*  AST 17  --  18 46*  ALT 10  --  9 29  ALKPHOS 82  --  101 137  BILITOT 0.5  --  0.5 0.48   Pathology report  Diagnosis  PERITONEAL/ASCITIC FLUID(SPECIMEN 1 OF 1 COLLECTED 04/26/14): METASTATIC ADENOCARCINOMA WITH SIGNET RING CELL FEATURES.  Stomach, biopsy, r/o cancer 04/25/14 - ADENOCARCINOMA WITH SIGNET RING FEATURES. Microscopic Comment The results were called to Dr. Ardis Hughs on 04/26/2014. (JDP:kh 04/26/14) Claudette Laws MD Pathologist, Electronic Signature (Case signed 04/26/2014)  HER-2/NEU BY CISH - NO AMPLIFICATION OF HER-2 DETECTED. RESULT RATIO OF HER2: CEP 17 SIGNALS 1.20 AVERAGE HER2 COPY NUMBER PER CELL 2.10  RADIOGRAPHIC STUDIES: I have personally reviewed the radiological images as listed and agreed with the findings in the report. Ct Chest Wo Contrast12/24/2015    FINDINGS: Multiple bilateral pulmonary nodules/metastases, including:  --6 mm nodule in the left lower lobe (series 5/ image 21)  --8 mm nodule in the left lower lobe (series 5/ image 21)  --10 mm nodule in the right lower lobe (series 5/ image 32)  Small bilateral pleural effusions.  No pneumothorax.  Visualized thyroid is unremarkable.  The heart is normal in size.  No pericardial effusion.  11 mm short axis lower paraesophageal node (series 2/image 39). 6 mm short axis right juxta  diaphragmatic node (series 2/image 36), indeterminate.  Visualized upper abdomen is notable for a mass at the GE junction, multiple large upper abdominal nodes, peritoneal carcinomatosis, upper abdominal ascites, and layering gallstones, unchanged from recent CT.  IMPRESSION: Multiple bilateral pulmonary nodules/metastases, measuring up to 10 mm in the right lower lobe.  11 mm short axis lower paraesophageal node, suspicious for nodal metastasis.  Small bilateral pleural effusions.  Gastric adenocarcinoma with upper abdominal lymphadenopathy and peritoneal carcinomatosis, incompletely visualized.   Electronically Signed   By: Julian Hy M.D.   On: 05/02/2014 08:43   Ct Abdomen Pelvis W Contrast12/15/2015    FINDINGS: Lower chest: Several pulmonary nodules are noted in the visualized lung bases bilaterally, the largest of which measures up to 1 cm in the right lower lobe (image 12 of series 3). Profound thickening of  the distal esophagus extending beyond the gastroesophageal junction into the proximal stomach.  Hepatobiliary: No cystic or solid hepatic lesions. No intra or extrahepatic biliary ductal dilatation. Gallbladder is remarkable for some amorphous increased attenuation dependently, likely to reflect some biliary sludge.  Pancreas: Fatty atrophy in the pancreas.  Otherwise, unremarkable.  Spleen: Unremarkable.  Adrenals/Urinary Tract: Bilateral adrenal glands and the right kidney are unremarkable in appearance. Exophytic 2.4 cm low-attenuation lesion in the anterior aspect of the upper pole of the left kidney is compatible with a simple cyst. No hydroureteronephrosis. Urinary bladder is normal in appearance.  Stomach/Bowel: Marked thickening of the gastric wall at the gastroesophageal junction, extending into the cardia and fundus of the stomach, most evident along the lesser curvature. This is contiguous with the previously mentioned esophageal wall thickening. No pathologic dilatation of small  bowel or colon.  Vascular/Lymphatic: Extensive upper abdominal lymphadenopathy, predominantly in the gastrohepatic ligament and celiac axis nodal stations, with the largest single lymph node measuring up to 3.7 x 3.0 cm in the celiac axis nodal station (image 27 of series 2). Portacaval lymphadenopathy measuring up to 1.7 cm. Notably, many of the enlarged celiac axis lymph nodes appear partially calcified, which could suggest a mucinous subtype of neoplasm. Enlarged left paraaortic retroperitoneal lymph nodes measuring up to 2.4 x 4.2 cm adjacent to the left renal vein (image 33 of series 2). No significant atherosclerotic disease in the abdominal or pelvic vasculature.  Reproductive: Prostate gland and seminal vesicles are unremarkable in appearance.  Other: Moderate volume of ascites, presumably malignant. Extensive soft tissue nodularity throughout the omentum, compatible with omental caking and widespread peritoneal metastasis. Additionally, there are other areas of enhancing nodularity along the peritoneal surface. Tiny ventral and umbilical hernias containing predominantly omental fat, including some omental implants. No pneumoperitoneum.  Musculoskeletal: There are no aggressive appearing lytic or blastic lesions noted in the visualized portions of the skeleton.   IMPRESSION:  1. Findings, as above, highly concerning for either esophageal or gastric primary malignancy, with metastatic lymphadenopathy in the upper abdomen and retroperitoneum, widespread intraperitoneal spread of disease, malignant ascites, and multiple pulmonary nodules in the visualize lung bases concerning for pulmonary metastasis. Oncologic evaluation is recommended.  2. Additional incidental findings, as above. These results were called by telephone at the time of interpretation on 04/23/2014 at 7:53 pm to [place] , who verbally acknowledged these results.   Electronically Signed   By: Vinnie Langton M.D.   On: 04/23/2014  19:56   US Paracentesis  04/26/2014   INDICATION: GE junction mass, lymphadenopathy, ascites. Request is made for diagnostic and therapeutic paracentesis.  EXAM: ULTRASOUND-GUIDED DIAGNOSTIC AND THERAPEUTIC PARACENTESIS  COMPARISON:  None.  MEDICATIONS: None.  COMPLICATIONS: None immediate  TECHNIQUE: Informed written consent was obtained from the patient after a discussion of the risks, benefits and alternatives to treatment. A timeout was performed prior to the initiation of the procedure.  Initial ultrasound scanning demonstrates a large amount of ascites within the right mid to lower abdominal quadrant. The right lmid to lower abdomen was prepped and draped in the usual sterile fashion. 1% lidocaine was used for local anesthesia. Under direct ultrasound guidance, a 19 gauge, 10-cm, Yueh catheter was introduced. An ultrasound image was saved for documentation purposed. The paracentesis was performed. The catheter was removed and a dressing was applied. The patient tolerated the procedure well without immediate post procedural complication.  FINDINGS: A total of approximately 4.5 liters of slightly turbid, yellow fluid was removed. Samples were  sent to the laboratory as requested by the clinical team.  IMPRESSION: Successful ultrasound-guided diagnostic and therapeutic paracentesis yielding 4.5 liters of peritoneal fluid.  Read by: Rowe Robert, PA-C   Electronically Signed   By: Corrie Mckusick D.O.   On: 04/26/2014 15:32   EGD WITH BIOPSY 04/25/2014 ENDOSCOPIC IMPRESSION: There was a large, nearly circumferential, ulcerated, clearly malignant mass that stradles the GE junction. The vast bulk of the mass lays in the stomach, occupying about 1/3 of the stomach. The mass extends into the esophagus for about 4cm above the GE junction. Mutiple biopsies were taken from the gastric portion of the mass   I ASSESSMENT & PLAN:  38 year old gentleman without significant past medical history presents with  abdominal bloating, nausea, vomiting, diarrhea, anemia and weight loss. EGD showed a large mass arising from the gastric fundus and extending into GE junction, biopsy showed adenocarcinoma with signet ring features, CT scan is consistent with peritoneal carcinomatosis and bilateral lung metastasis. Ascites cytology was positive for malignant cells.  1. 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 Unfortunately, this is widely metastatic, not curable, and overall prognosis is very poor. Literature has showed gastric adenocarcinoma with signet ring features has overall worse outcome comparing to others. His life expectancy is probably less than 6 months without treatment. -We discussed treatment options, namely chemotherapy. We discussed surgery is not an option at this stage, and he probably wouldn't need palliative radiation at this point. Due to his limited performance status, and no Cummings evidence that 3 drugs is better than 2 chemotherapy drug combination, I recommend FOLFOX as his first-line treatment. Side effects were discussed with patient in great detail, which include but not limited to, fatigue, cytopenia, risk of infection, need for blood transfusion, nausea, diarrhea, peripheral neuropathy, cold intolerance, coronary artery artery spasm and had heart attack , etc. Chemotherapy consent was obtained today. I recommend FOLFOX every 2 weeks, and restaging to evaluate his response after 4 cycles.  -His tumor is negative for HER-2 overexpression, no role for trastuzumab.  2. Malignant ascites -I discussed the option of repeated paracentesis, versus Pleurx placement. Patient is concerned about the risk of infection from Pleurx, I would request IR to schedule her repeated paracentesis later  this week or early next week.  -If he is responding to chemotherapy, I anticipate he will need less paracentesis in the future.  3. Anorexia, nausea, malnutrition -Those are all related to his cancer. -I recommend mirtazapine 15 mg at bedtime for his anorexia. -Continue Zofran for nausea as needed, I also called in Compazine as needed after chemotherapy -I encouraged him to continue nutrition supplement, dietitian consult  4. Anemia, this is likely related to his blood loss from cancer  -I'll check his ferritin and iron study today. If he has iron deficient anemia, I'll give him IV Feraheme.   Plan #1 for cycle FOLFOX tomorrow #2 return to clinic in 2 weeks for second cycle FOLFOX, lab and the office visit     Orders Placed This Encounter  Procedures  . US Paracentesis    Please schedule one for this Thursday if possible or early next week, and second one 3 weeks from now, call me [phone] for questions    Standing Status: Future     Number of Occurrences:      Standing  Expiration Date: 07/08/2015    Order Specific Question:  If therapeutic, is there a maximum amount of fluid to be removed?    Answer:  Yes    Order Specific Question:  What is the maximum amount of fluide to be removed?    Answer:  5L    Order Specific Question:  Are labs required for specimen collection?    Answer:  No    Order Specific Question:  Reason for Exam (SYMPTOM  OR DIAGNOSIS REQUIRED)    Answer:  symptomatic    Order Specific Question:  Preferred imaging location?    Answer:  [facility]  . CBC with Differential    Standing Status: Standing     Number of Occurrences: 20     Standing Expiration Date: 05/07/2015  . Comprehensive metabolic panel (Cmet) - [facility]    Standing Status: Standing     Number of Occurrences: 20     Standing Expiration Date: 05/07/2015  . Ferritin    Standing Status: Future     Number of Occurrences:      Standing Expiration Date: 05/07/2015  . Iron and TIBC [facility]     Standing Status: Future     Number of Occurrences:      Standing Expiration Date: 05/07/2015    All questions were answered. The patient knows to call the clinic with any problems, questions or concerns. I spent 30 minutes counseling the patient face to face. The total time spent in the appointment was 40 minutes and more than 50% was on counseling.     Truitt Merle, MD 05/06/2014 5:21 PM

## 2014-05-06 NOTE — Progress Notes (Signed)
Spoke with pt that's uninsured at the moment.  BCBS will become active on 05/10/14.  I gave him a financial application to apply for assistance thru the hospital to cover services until his insurance becomes active.  I explained once his insurance becomes active the discount he could possibly get thru the hospital will become inactive because he can't have both based on the policy.  He verbalized understanding.  He has my card for any questions or concerns.

## 2014-05-07 ENCOUNTER — Other Ambulatory Visit: Payer: Self-pay | Admitting: Hematology

## 2014-05-07 ENCOUNTER — Encounter: Payer: Self-pay | Admitting: Hematology

## 2014-05-07 ENCOUNTER — Ambulatory Visit (HOSPITAL_BASED_OUTPATIENT_CLINIC_OR_DEPARTMENT_OTHER): Payer: Medicaid Other

## 2014-05-07 DIAGNOSIS — C169 Malignant neoplasm of stomach, unspecified: Secondary | ICD-10-CM

## 2014-05-07 DIAGNOSIS — Z5111 Encounter for antineoplastic chemotherapy: Secondary | ICD-10-CM

## 2014-05-07 DIAGNOSIS — C786 Secondary malignant neoplasm of retroperitoneum and peritoneum: Secondary | ICD-10-CM

## 2014-05-07 DIAGNOSIS — J910 Malignant pleural effusion: Secondary | ICD-10-CM

## 2014-05-07 DIAGNOSIS — C7800 Secondary malignant neoplasm of unspecified lung: Secondary | ICD-10-CM

## 2014-05-07 MED ORDER — ONDANSETRON 8 MG/50ML IVPB (CHCC)
8.0000 mg | Freq: Once | INTRAVENOUS | Status: AC
  Administered 2014-05-07: 8 mg via INTRAVENOUS

## 2014-05-07 MED ORDER — PROCHLORPERAZINE MALEATE 10 MG PO TABS
ORAL_TABLET | ORAL | Status: AC
  Filled 2014-05-07: qty 1

## 2014-05-07 MED ORDER — DEXAMETHASONE SODIUM PHOSPHATE 10 MG/ML IJ SOLN
10.0000 mg | Freq: Once | INTRAMUSCULAR | Status: AC
  Administered 2014-05-07: 10 mg via INTRAVENOUS

## 2014-05-07 MED ORDER — SODIUM CHLORIDE 0.9 % IV SOLN
2400.0000 mg/m2 | INTRAVENOUS | Status: DC
  Administered 2014-05-07: 6000 mg via INTRAVENOUS
  Filled 2014-05-07: qty 120

## 2014-05-07 MED ORDER — ONDANSETRON 8 MG/NS 50 ML IVPB
INTRAVENOUS | Status: AC
Start: 2014-05-07 — End: 2014-05-07
  Filled 2014-05-07: qty 8

## 2014-05-07 MED ORDER — DEXAMETHASONE SODIUM PHOSPHATE 10 MG/ML IJ SOLN
INTRAMUSCULAR | Status: AC
  Filled 2014-05-07: qty 1

## 2014-05-07 MED ORDER — PROCHLORPERAZINE MALEATE 10 MG PO TABS
10.0000 mg | ORAL_TABLET | Freq: Once | ORAL | Status: AC
  Administered 2014-05-07: 10 mg via ORAL

## 2014-05-07 MED ORDER — FLUOROURACIL CHEMO INJECTION 2.5 GM/50ML
400.0000 mg/m2 | Freq: Once | INTRAVENOUS | Status: AC
  Administered 2014-05-07: 1000 mg via INTRAVENOUS
  Filled 2014-05-07: qty 20

## 2014-05-07 MED ORDER — DEXTROSE 5 % IV SOLN
Freq: Once | INTRAVENOUS | Status: AC
  Administered 2014-05-07: 08:00:00 via INTRAVENOUS

## 2014-05-07 MED ORDER — OXALIPLATIN CHEMO INJECTION 100 MG/20ML
85.0000 mg/m2 | Freq: Once | INTRAVENOUS | Status: AC
  Administered 2014-05-07: 210 mg via INTRAVENOUS
  Filled 2014-05-07: qty 42

## 2014-05-07 MED ORDER — LEUCOVORIN CALCIUM INJECTION 350 MG
400.0000 mg/m2 | Freq: Once | INTRAVENOUS | Status: AC
  Administered 2014-05-07: 996 mg via INTRAVENOUS
  Filled 2014-05-07: qty 49.8

## 2014-05-07 NOTE — Progress Notes (Signed)
Put disability form in registration desk. °

## 2014-05-07 NOTE — Progress Notes (Signed)
Patient tolerated 1st folfox well. AVS/teaching went over with patient and father. Emphasized importance of taking nausea medication as needed, cold sensitivity to oxaliplatin, and importance of drinking adequate fluids. Patient voices understanding and knows to call us with any questions or concerns. Pump reviewed with patient. Pump information booklet and chemo spill kit explained and given to patient; both father and patient voice understanding of teaching, and knows when to return for pump disconnect.

## 2014-05-07 NOTE — Patient Instructions (Signed)
[facility] Discharge Instructions for Patients Receiving Chemotherapy  Today you received the following chemotherapy agents: Leucovorin, Oxaliplatin, 5FU push/pump  REMEMBER: This medicine can make you more sensitive to cold. Do not drink cold drinks or use ice. Cover exposed skin before coming in contact with cold temperatures or cold objects. When out in cold weather wear warm clothing and cover your mouth and nose to warm the air that goes into your lungs. Tell your doctor if you get sensitive to the cold.  To help prevent nausea and vomiting after your treatment, we encourage you to take your nausea medication as prescribed by your physician: Compazine 10 mg every 6 hrs as needed for nausea, or Zofran 4 mg every 8 hrs as needed for nausea/vomiting.  You may take 1 dose of either nausea medication tonight at bedtime.   If you develop nausea and vomiting that is not controlled by your nausea medication, call the clinic.   BELOW ARE SYMPTOMS THAT SHOULD BE REPORTED IMMEDIATELY:  *FEVER GREATER THAN 100.5 F  *CHILLS WITH OR WITHOUT FEVER  NAUSEA AND VOMITING THAT IS NOT CONTROLLED WITH YOUR NAUSEA MEDICATION  *UNUSUAL SHORTNESS OF BREATH  *UNUSUAL BRUISING OR BLEEDING  TENDERNESS IN MOUTH AND THROAT WITH OR WITHOUT PRESENCE OF ULCERS  *URINARY PROBLEMS  *BOWEL PROBLEMS  UNUSUAL RASH Items with * indicate a potential emergency and should be followed up as soon as possible.  Feel free to call the clinic you have any questions or concerns. The clinic phone number is [phone].  Oxaliplatin Injection What is this medicine? OXALIPLATIN (ox AL i PLA tin) is a chemotherapy drug. It targets fast dividing cells, like cancer cells, and causes these cells to die. This medicine is used to treat cancers of the colon and rectum, and many other cancers. This medicine may be used for other purposes; ask your health care provider or pharmacist if you have questions. COMMON  BRAND NAME(S): Eloxatin What should I tell my health care provider before I take this medicine? They need to know if you have any of these conditions: -kidney disease -an unusual or allergic reaction to oxaliplatin, other chemotherapy, other medicines, foods, dyes, or preservatives -pregnant or trying to get pregnant -breast-feeding How should I use this medicine? This drug is given as an infusion into a vein. It is administered in a hospital or clinic by a specially trained health care professional. Talk to your pediatrician regarding the use of this medicine in children. Special care may be needed. Overdosage: If you think you have taken too much of this medicine contact a poison control center or emergency room at once. NOTE: This medicine is only for you. Do not share this medicine with others. What if I miss a dose? It is important not to miss a dose. Call your doctor or health care professional if you are unable to keep an appointment. What may interact with this medicine? -medicines to increase blood counts like filgrastim, pegfilgrastim, sargramostim -probenecid -some antibiotics like amikacin, gentamicin, neomycin, polymyxin B, streptomycin, tobramycin -zalcitabine Talk to your doctor or health care professional before taking any of these medicines: -acetaminophen -aspirin -ibuprofen -ketoprofen -naproxen This list may not describe all possible interactions. Give your health care provider a list of all the medicines, herbs, non-prescription drugs, or dietary supplements you use. Also tell them if you smoke, drink alcohol, or use illegal drugs. Some items may interact with your medicine. What should I watch for while using this medicine? Your condition will  be monitored carefully while you are receiving this medicine. You will need important blood work done while you are taking this medicine. This medicine can make you more sensitive to cold. Do not drink cold drinks or use ice.  Cover exposed skin before coming in contact with cold temperatures or cold objects. When out in cold weather wear warm clothing and cover your mouth and nose to warm the air that goes into your lungs. Tell your doctor if you get sensitive to the cold. This drug may make you feel generally unwell. This is not uncommon, as chemotherapy can affect healthy cells as well as cancer cells. Report any side effects. Continue your course of treatment even though you feel ill unless your doctor tells you to stop. In some cases, you may be given additional medicines to help with side effects. Follow all directions for their use. Call your doctor or health care professional for advice if you get a fever, chills or sore throat, or other symptoms of a cold or flu. Do not treat yourself. This drug decreases your body's ability to fight infections. Try to avoid being around people who are sick. This medicine may increase your risk to bruise or bleed. Call your doctor or health care professional if you notice any unusual bleeding. Be careful brushing and flossing your teeth or using a toothpick because you may get an infection or bleed more easily. If you have any dental work done, tell your dentist you are receiving this medicine. Avoid taking products that contain aspirin, acetaminophen, ibuprofen, naproxen, or ketoprofen unless instructed by your doctor. These medicines may hide a fever. Do not become pregnant while taking this medicine. Women should inform their doctor if they wish to become pregnant or think they might be pregnant. There is a potential for serious side effects to an unborn child. Talk to your health care professional or pharmacist for more information. Do not breast-feed an infant while taking this medicine. Call your doctor or health care professional if you get diarrhea. Do not treat yourself. What side effects may I notice from receiving this medicine? Side effects that you should report to your  doctor or health care professional as soon as possible: -allergic reactions like skin rash, itching or hives, swelling of the face, lips, or tongue -low blood counts - This drug may decrease the number of white blood cells, red blood cells and platelets. You may be at increased risk for infections and bleeding. -signs of infection - fever or chills, cough, sore throat, pain or difficulty passing urine -signs of decreased platelets or bleeding - bruising, pinpoint red spots on the skin, black, tarry stools, nosebleeds -signs of decreased red blood cells - unusually weak or tired, fainting spells, lightheadedness -breathing problems -chest pain, pressure -cough -diarrhea -jaw tightness -mouth sores -nausea and vomiting -pain, swelling, redness or irritation at the injection site -pain, tingling, numbness in the hands or feet -problems with balance, talking, walking -redness, blistering, peeling or loosening of the skin, including inside the mouth -trouble passing urine or change in the amount of urine Side effects that usually do not require medical attention (report to your doctor or health care professional if they continue or are bothersome): -changes in vision -constipation -hair loss -loss of appetite -metallic taste in the mouth or changes in taste -stomach pain This list may not describe all possible side effects. Call your doctor for medical advice about side effects. You may report side effects to FDA at 1-800-FDA-1088. Where should  I keep my medicine? This drug is given in a hospital or clinic and will not be stored at home. NOTE: This sheet is a summary. It may not cover all possible information. If you have questions about this medicine, talk to your doctor, pharmacist, or health care provider.  2015, Elsevier/[employer] Standard. (2007-11-21 17:22:47)  Leucovorin injection What is this medicine? LEUCOVORIN (loo koe VOR in) is used to prevent or treat the harmful effects of some  medicines. This medicine is used to treat anemia caused by a low amount of folic acid in the body. It is also used with 5-fluorouracil (5-FU) to treat colon cancer. This medicine may be used for other purposes; ask your health care provider or pharmacist if you have questions. What should I tell my health care provider before I take this medicine? They need to know if you have any of these conditions: -anemia from low levels of vitamin B-12 in the blood -an unusual or allergic reaction to leucovorin, folic acid, other medicines, foods, dyes, or preservatives -pregnant or trying to get pregnant -breast-feeding How should I use this medicine? This medicine is for injection into a muscle or into a vein. It is given by a health care professional in a hospital or clinic setting. Talk to your pediatrician regarding the use of this medicine in children. Special care may be needed. Overdosage: If you think you have taken too much of this medicine contact a poison control center or emergency room at once. NOTE: This medicine is only for you. Do not share this medicine with others. What if I miss a dose? This does not apply. What may interact with this medicine? -capecitabine -fluorouracil -phenobarbital -phenytoin -primidone -trimethoprim-sulfamethoxazole This list may not describe all possible interactions. Give your health care provider a list of all the medicines, herbs, non-prescription drugs, or dietary supplements you use. Also tell them if you smoke, drink alcohol, or use illegal drugs. Some items may interact with your medicine. What should I watch for while using this medicine? Your condition will be monitored carefully while you are receiving this medicine. This medicine may increase the side effects of 5-fluorouracil, 5-FU. Tell your doctor or health care professional if you have diarrhea or mouth sores that do not get better or that get worse. What side effects may I notice from  receiving this medicine? Side effects that you should report to your doctor or health care professional as soon as possible: -allergic reactions like skin rash, itching or hives, swelling of the face, lips, or tongue -breathing problems -fever, infection -mouth sores -unusual bleeding or bruising -unusually weak or tired Side effects that usually do not require medical attention (report to your doctor or health care professional if they continue or are bothersome): -constipation or diarrhea -loss of appetite -nausea, vomiting This list may not describe all possible side effects. Call your doctor for medical advice about side effects. You may report side effects to FDA at 1-800-FDA-1088. Where should I keep my medicine? This drug is given in a hospital or clinic and will not be stored at home. NOTE: This sheet is a summary. It may not cover all possible information. If you have questions about this medicine, talk to your doctor, pharmacist, or health care provider.  2015, [employer] Standard. (2007-10-31 16:50:29)  Fluorouracil, 5-FU injection What is this medicine? FLUOROURACIL, 5-FU (flure oh YOOR a sil) is a chemotherapy drug. It slows the growth of cancer cells. This medicine is used to treat many types of cancer like  breast cancer, colon or rectal cancer, pancreatic cancer, and stomach cancer. This medicine may be used for other purposes; ask your health care provider or pharmacist if you have questions. COMMON BRAND NAME(S): Adrucil What should I tell my health care provider before I take this medicine? They need to know if you have any of these conditions: -blood disorders -dihydropyrimidine dehydrogenase (DPD) deficiency -infection (especially a virus infection such as chickenpox, cold sores, or herpes) -kidney disease -liver disease -malnourished, poor nutrition -recent or ongoing radiation therapy -an unusual or allergic reaction to fluorouracil, other chemotherapy, other  medicines, foods, dyes, or preservatives -pregnant or trying to get pregnant -breast-feeding How should I use this medicine? This drug is given as an infusion or injection into a vein. It is administered in a hospital or clinic by a specially trained health care professional. Talk to your pediatrician regarding the use of this medicine in children. Special care may be needed. Overdosage: If you think you have taken too much of this medicine contact a poison control center or emergency room at once. NOTE: This medicine is only for you. Do not share this medicine with others. What if I miss a dose? It is important not to miss your dose. Call your doctor or health care professional if you are unable to keep an appointment. What may interact with this medicine? -allopurinol -cimetidine -dapsone -digoxin -hydroxyurea -leucovorin -levamisole -medicines for seizures like ethotoin, fosphenytoin, phenytoin -medicines to increase blood counts like filgrastim, pegfilgrastim, sargramostim -medicines that treat or prevent blood clots like warfarin, enoxaparin, and dalteparin -methotrexate -metronidazole -pyrimethamine -some other chemotherapy drugs like busulfan, cisplatin, estramustine, vinblastine -trimethoprim -trimetrexate -vaccines Talk to your doctor or health care professional before taking any of these medicines: -acetaminophen -aspirin -ibuprofen -ketoprofen -naproxen This list may not describe all possible interactions. Give your health care provider a list of all the medicines, herbs, non-prescription drugs, or dietary supplements you use. Also tell them if you smoke, drink alcohol, or use illegal drugs. Some items may interact with your medicine. What should I watch for while using this medicine? Visit your doctor for checks on your progress. This drug may make you feel generally unwell. This is not uncommon, as chemotherapy can affect healthy cells as well as cancer cells. Report  any side effects. Continue your course of treatment even though you feel ill unless your doctor tells you to stop. In some cases, you may be given additional medicines to help with side effects. Follow all directions for their use. Call your doctor or health care professional for advice if you get a fever, chills or sore throat, or other symptoms of a cold or flu. Do not treat yourself. This drug decreases your body's ability to fight infections. Try to avoid being around people who are sick. This medicine may increase your risk to bruise or bleed. Call your doctor or health care professional if you notice any unusual bleeding. Be careful brushing and flossing your teeth or using a toothpick because you may get an infection or bleed more easily. If you have any dental work done, tell your dentist you are receiving this medicine. Avoid taking products that contain aspirin, acetaminophen, ibuprofen, naproxen, or ketoprofen unless instructed by your doctor. These medicines may hide a fever. Do not become pregnant while taking this medicine. Women should inform their doctor if they wish to become pregnant or think they might be pregnant. There is a potential for serious side effects to an unborn child. Talk to your health care  professional or pharmacist for more information. Do not breast-feed an infant while taking this medicine. Men should inform their doctor if they wish to father a child. This medicine may lower sperm counts. Do not treat diarrhea with over the counter products. Contact your doctor if you have diarrhea that lasts more than 2 days or if it is severe and watery. This medicine can make you more sensitive to the sun. Keep out of the sun. If you cannot avoid being in the sun, wear protective clothing and use sunscreen. Do not use sun lamps or tanning beds/booths. What side effects may I notice from receiving this medicine? Side effects that you should report to your doctor or health care  professional as soon as possible: -allergic reactions like skin rash, itching or hives, swelling of the face, lips, or tongue -low blood counts - this medicine may decrease the number of white blood cells, red blood cells and platelets. You may be at increased risk for infections and bleeding. -signs of infection - fever or chills, cough, sore throat, pain or difficulty passing urine -signs of decreased platelets or bleeding - bruising, pinpoint red spots on the skin, black, tarry stools, blood in the urine -signs of decreased red blood cells - unusually weak or tired, fainting spells, lightheadedness -breathing problems -changes in vision -chest pain -mouth sores -nausea and vomiting -pain, swelling, redness at site where injected -pain, tingling, numbness in the hands or feet -redness, swelling, or sores on hands or feet -stomach pain -unusual bleeding Side effects that usually do not require medical attention (report to your doctor or health care professional if they continue or are bothersome): -changes in finger or toe nails -diarrhea -dry or itchy skin -hair loss -headache -loss of appetite -sensitivity of eyes to the light -stomach upset -unusually teary eyes This list may not describe all possible side effects. Call your doctor for medical advice about side effects. You may report side effects to FDA at 1-800-FDA-1088. Where should I keep my medicine? This drug is given in a hospital or clinic and will not be stored at home. NOTE: This sheet is a summary. It may not cover all possible information. If you have questions about this medicine, talk to your doctor, pharmacist, or health care provider.  2015, [employer] Standard. (2007-08-30 13:53:16)

## 2014-05-07 NOTE — Progress Notes (Signed)
Oxaliplatin, leucovorin, fluouracil are not replaceable drugs

## 2014-05-07 NOTE — Progress Notes (Signed)
0820- Patient c/o slight nausea with dry heaves. Per Dr. Burr Medico, okay to give Compazine 10 mg po x 1 dose.

## 2014-05-08 ENCOUNTER — Inpatient Hospital Stay: Payer: Self-pay | Admitting: Hematology

## 2014-05-08 ENCOUNTER — Ambulatory Visit: Payer: Self-pay

## 2014-05-08 ENCOUNTER — Other Ambulatory Visit: Payer: Self-pay

## 2014-05-08 ENCOUNTER — Telehealth: Payer: Self-pay | Admitting: *Deleted

## 2014-05-08 NOTE — Telephone Encounter (Signed)
Nausea last night around 0300, with vomiting X 1. Took antiemetic and repeated dose at 0900 today. Has been able to eat an egg for breakfast. Drinking fluids without difficulty. NO diarrhea, but has Lomotil on hand if it starts. Reviewed cold weather/sensitivity issues and precautions. Abdomen is alittle uncomfortable, but is scheduled for paracentesis tomorrow and that will help. Some difficulty sleeping, but says "I did OK".

## 2014-05-09 ENCOUNTER — Ambulatory Visit (HOSPITAL_COMMUNITY)
Admission: RE | Admit: 2014-05-09 | Discharge: 2014-05-09 | Disposition: A | Discharge status: Home / Self Care | Payer: Medicaid Other | Source: Ambulatory Visit | Attending: Hematology | Admitting: Hematology

## 2014-05-09 ENCOUNTER — Ambulatory Visit (HOSPITAL_BASED_OUTPATIENT_CLINIC_OR_DEPARTMENT_OTHER): Payer: Medicaid Other

## 2014-05-09 ENCOUNTER — Ambulatory Visit (HOSPITAL_COMMUNITY): Payer: Self-pay

## 2014-05-09 ENCOUNTER — Other Ambulatory Visit (HOSPITAL_COMMUNITY): Payer: Self-pay

## 2014-05-09 DIAGNOSIS — Z452 Encounter for adjustment and management of vascular access device: Secondary | ICD-10-CM

## 2014-05-09 DIAGNOSIS — C169 Malignant neoplasm of stomach, unspecified: Secondary | ICD-10-CM | POA: Insufficient documentation

## 2014-05-09 DIAGNOSIS — R180 Malignant ascites: Secondary | ICD-10-CM | POA: Diagnosis not present

## 2014-05-09 MED ORDER — SODIUM CHLORIDE 0.9 % IJ SOLN
10.0000 mL | INTRAMUSCULAR | Status: DC | PRN
  Administered 2014-05-09: 10 mL
  Filled 2014-05-09: qty 10

## 2014-05-09 MED ORDER — HEPARIN SOD (PORK) LOCK FLUSH 100 UNIT/ML IV SOLN
500.0000 [IU] | Freq: Once | INTRAVENOUS | Status: AC | PRN
  Administered 2014-05-09: 500 [IU]
  Filled 2014-05-09: qty 5

## 2014-05-09 NOTE — Procedures (Signed)
US guided therapeutic paracentesis performed yielding 4.4 liters yellow fluid. No immediate complications.

## 2014-05-21 ENCOUNTER — Telehealth: Payer: Self-pay | Admitting: Hematology

## 2014-05-21 ENCOUNTER — Ambulatory Visit (HOSPITAL_BASED_OUTPATIENT_CLINIC_OR_DEPARTMENT_OTHER): Payer: BLUE CROSS/BLUE SHIELD

## 2014-05-21 ENCOUNTER — Other Ambulatory Visit (HOSPITAL_BASED_OUTPATIENT_CLINIC_OR_DEPARTMENT_OTHER): Payer: BLUE CROSS/BLUE SHIELD

## 2014-05-21 ENCOUNTER — Ambulatory Visit (HOSPITAL_BASED_OUTPATIENT_CLINIC_OR_DEPARTMENT_OTHER): Payer: BLUE CROSS/BLUE SHIELD | Admitting: Hematology

## 2014-05-21 ENCOUNTER — Encounter: Payer: Self-pay | Admitting: Hematology

## 2014-05-21 VITALS — BP 134/72 | HR 82 | Temp 98.1°F | Resp 19 | Ht 69.0 in | Wt 278.3 lb

## 2014-05-21 DIAGNOSIS — C7801 Secondary malignant neoplasm of right lung: Secondary | ICD-10-CM

## 2014-05-21 DIAGNOSIS — D649 Anemia, unspecified: Secondary | ICD-10-CM

## 2014-05-21 DIAGNOSIS — Z5111 Encounter for antineoplastic chemotherapy: Secondary | ICD-10-CM

## 2014-05-21 DIAGNOSIS — R630 Anorexia: Secondary | ICD-10-CM

## 2014-05-21 DIAGNOSIS — C799 Secondary malignant neoplasm of unspecified site: Secondary | ICD-10-CM

## 2014-05-21 DIAGNOSIS — C801 Malignant (primary) neoplasm, unspecified: Secondary | ICD-10-CM

## 2014-05-21 DIAGNOSIS — C786 Secondary malignant neoplasm of retroperitoneum and peritoneum: Secondary | ICD-10-CM

## 2014-05-21 DIAGNOSIS — C7802 Secondary malignant neoplasm of left lung: Secondary | ICD-10-CM

## 2014-05-21 DIAGNOSIS — C169 Malignant neoplasm of stomach, unspecified: Secondary | ICD-10-CM

## 2014-05-21 LAB — CBC WITH DIFFERENTIAL/PLATELET
BASO%: 0.8 % (ref 0.0–2.0)
Basophils Absolute: 0 10*3/uL (ref 0.0–0.1)
EOS%: 2.3 % (ref 0.0–7.0)
Eosinophils Absolute: 0.1 10*3/uL (ref 0.0–0.5)
HEMATOCRIT: 34.3 % — AB (ref 38.4–49.9)
HGB: 10.4 g/dL — ABNORMAL LOW (ref 13.0–17.1)
LYMPH%: 43.1 % (ref 14.0–49.0)
MCH: 22.9 pg — ABNORMAL LOW (ref 27.2–33.4)
MCHC: 30.4 g/dL — ABNORMAL LOW (ref 32.0–36.0)
MCV: 75.5 fL — ABNORMAL LOW (ref 79.3–98.0)
MONO#: 0.7 10*3/uL (ref 0.1–0.9)
MONO%: 23.3 % — ABNORMAL HIGH (ref 0.0–14.0)
NEUT%: 30.5 % — ABNORMAL LOW (ref 39.0–75.0)
NEUTROS ABS: 1 10*3/uL — AB (ref 1.5–6.5)
Platelets: 461 10*3/uL — ABNORMAL HIGH (ref 140–400)
RBC: 4.55 10*6/uL (ref 4.20–5.82)
RDW: 17.3 % — ABNORMAL HIGH (ref 11.0–14.6)
WBC: 3.2 10*3/uL — ABNORMAL LOW (ref 4.0–10.3)
lymph#: 1.4 10*3/uL (ref 0.9–3.3)

## 2014-05-21 LAB — COMPREHENSIVE METABOLIC PANEL (CC13)
ALK PHOS: 111 U/L (ref 40–150)
ALT: 23 U/L (ref 0–55)
AST: 27 U/L (ref 5–34)
Albumin: 2.4 g/dL — ABNORMAL LOW (ref 3.5–5.0)
Anion Gap: 9 mEq/L (ref 3–11)
BUN: 15.7 mg/dL (ref 7.0–26.0)
CO2: 28 mEq/L (ref 22–29)
CREATININE: 0.8 mg/dL (ref 0.7–1.3)
Calcium: 8.3 mg/dL — ABNORMAL LOW (ref 8.4–10.4)
Chloride: 105 mEq/L (ref 98–109)
EGFR: >90 mL/min/{1.73_m2} (ref >90)
Glucose: 105 mg/dl (ref 70–140)
Potassium: 3.8 mEq/L (ref 3.5–5.1)
Sodium: 142 mEq/L (ref 136–145)
Total Bilirubin: 0.3 mg/dL (ref 0.20–1.20)
Total Protein: 5.9 g/dL — ABNORMAL LOW (ref 6.4–8.3)

## 2014-05-21 LAB — FERRITIN CHCC: Ferritin: 171 ng/ml (ref 22–316)

## 2014-05-21 LAB — IRON AND TIBC CHCC
%SAT: 10 % — ABNORMAL LOW (ref 20–55)
IRON: 26 ug/dL — AB (ref 42–163)
TIBC: 259 ug/dL (ref 202–409)
UIBC: 232 ug/dL (ref 117–376)

## 2014-05-21 MED ORDER — SODIUM CHLORIDE 0.9 % IV SOLN
2400.0000 mg/m2 | INTRAVENOUS | Status: DC
  Administered 2014-05-21: 6000 mg via INTRAVENOUS
  Filled 2014-05-21: qty 120

## 2014-05-21 MED ORDER — PROCHLORPERAZINE MALEATE 10 MG PO TABS
10.0000 mg | ORAL_TABLET | Freq: Four times a day (QID) | ORAL | Status: DC | PRN
  Administered 2014-05-21: 10 mg via ORAL

## 2014-05-21 MED ORDER — ONDANSETRON 8 MG/NS 50 ML IVPB
INTRAVENOUS | Status: AC
  Filled 2014-05-21: qty 8

## 2014-05-21 MED ORDER — DEXAMETHASONE SODIUM PHOSPHATE 10 MG/ML IJ SOLN
INTRAMUSCULAR | Status: AC
  Filled 2014-05-21: qty 1

## 2014-05-21 MED ORDER — OXALIPLATIN CHEMO INJECTION 100 MG/20ML
72.0000 mg/m2 | Freq: Once | INTRAVENOUS | Status: AC
  Administered 2014-05-21: 180 mg via INTRAVENOUS
  Filled 2014-05-21: qty 36

## 2014-05-21 MED ORDER — PROCHLORPERAZINE MALEATE 10 MG PO TABS
ORAL_TABLET | ORAL | Status: AC
  Filled 2014-05-21: qty 1

## 2014-05-21 MED ORDER — LEUCOVORIN CALCIUM INJECTION 350 MG
400.0000 mg/m2 | Freq: Once | INTRAVENOUS | Status: AC
  Administered 2014-05-21: 996 mg via INTRAVENOUS
  Filled 2014-05-21: qty 49.8

## 2014-05-21 MED ORDER — DEXAMETHASONE SODIUM PHOSPHATE 10 MG/ML IJ SOLN
10.0000 mg | Freq: Once | INTRAMUSCULAR | Status: AC
  Administered 2014-05-21: 10 mg via INTRAVENOUS

## 2014-05-21 MED ORDER — DEXTROSE 5 % IV SOLN
Freq: Once | INTRAVENOUS | Status: AC
  Administered 2014-05-21: 11:00:00 via INTRAVENOUS

## 2014-05-21 MED ORDER — ONDANSETRON 8 MG/50ML IVPB (CHCC)
8.0000 mg | Freq: Once | INTRAVENOUS | Status: AC
  Administered 2014-05-21: 8 mg via INTRAVENOUS

## 2014-05-21 NOTE — Progress Notes (Signed)
Pt saw Dr. Burr Medico today prior to chemo.  OK to treat with ANC 1.0  As per md with dose reduction.  Pt to receive Neulasta on day 3.

## 2014-05-21 NOTE — Progress Notes (Signed)
OK to treat with ANC of 1.0 per Dr. Burr Medico.

## 2014-05-21 NOTE — Patient Instructions (Signed)
[facility] Discharge Instructions for Patients Receiving Chemotherapy  Today you received the following chemotherapy agents FOLFOX  To help prevent nausea and vomiting after your treatment, we encourage you to take your nausea medication as needed   If you develop nausea and vomiting that is not controlled by your nausea medication, call the clinic.   BELOW ARE SYMPTOMS THAT SHOULD BE REPORTED IMMEDIATELY:  *FEVER GREATER THAN 100.5 F  *CHILLS WITH OR WITHOUT FEVER  NAUSEA AND VOMITING THAT IS NOT CONTROLLED WITH YOUR NAUSEA MEDICATION  *UNUSUAL SHORTNESS OF BREATH  *UNUSUAL BRUISING OR BLEEDING  TENDERNESS IN MOUTH AND THROAT WITH OR WITHOUT PRESENCE OF ULCERS  *URINARY PROBLEMS  *BOWEL PROBLEMS  UNUSUAL RASH Items with * indicate a potential emergency and should be followed up as soon as possible.  Feel free to call the clinic you have any questions or concerns. The clinic phone number is [phone].

## 2014-05-21 NOTE — Progress Notes (Signed)
[place]  Telephone:([phone] Fax:([phone]) [phone]  Clinic New Consult Note   No care team member to display 05/21/2014  CHIEF COMPLAINTS/PURPOSE OF CONSULTATION:  Newly diagnosed metastatic gastric cancer    Gastric adenocarcinoma: Stage IV   04/23/2014 Imaging CT abdomen showed peritoneal carcinomatosis, ascites, thickning of gastric and GEJ wall, CT chest showed a few small b/l lung nodules.    04/24/2014 Tumor Marker CEA  5.6, CA19.9 13   04/25/2014 Initial Diagnosis Gastric adenocarcinoma: Stage IV   04/25/2014 Pathology Results Stomach mass biopsy showed - ADENOCARCINOMA WITH SIGNET RING FEATURES. ascites cytology (+)   04/25/2014 Procedure EGD: There was a large, nearly circumferential, ulcerated, clearly malignant mass that stradles the GE junction. The vast bulk of the mass lays in the stomach.      HISTORY OF PRESENTING ILLNESS:  Samuel Durham 39 y.o. 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 I saw him first when he was recently admitted to [facility]. He is here for the first office visit after hospital discharge.  He was  admitted on 12/15 with progressive, 4 month history of dysphagia, epigastric abdominal discomfort and bloating, early satiation accompanied by nausea, and postprandial vomiting and diarrhea. He has experienced a 30 lbs weight loss in the process. He has noted increased abdominal girth. Patient denies any shortness of breath, chest pain, GI bleed, hematuria or lower extremity swelling. He denies heavy alcohol intake. Denies tobacco abuse. Denies risk factors for HIV or hepatitis. He denies any family history of GI malignancies.  CT of the abdomen and pelvis with contrast on 12/15 revealed several pulmonary nodules at the bases bilaterally, profound thickening of the distal esophagus extending beyond the gastroesophageal junction into the proximal stomach,cardia and fundus of the stomach, most  evident along the lesser curvature. No pathologic dilatation of small bowel or colon. Extensive upper abdominal and retroperitoneal lymphadenopathy was visualized. Moderate volume of ascites, presumably malignant, was noted. Extensive soft tissue nodularity throughout the omentum, compatible with omental caking and widespread peritoneal metastasis. Additionally, there are other areas of enhancing nodularity along the peritoneal surface. Tiny ventral and umbilical hernias containing predominantly omental fat, including some omental implants. There are no aggressive appearing lytic or blastic lesions noted in the visualized portions of the skeleton.  He underwent an EGD by Dr. Ardis Hughs on 04/25/2014, which showed a large fungating mass that straddles the GE junction and occupying the upper 1/3 part of the stomach. Multiple biopsy was taken which showed adenocarcinoma with signet ring features. He also underwent a paracentesis with 4.5 L fluid is removed on 04/26/2014. Cytology was positive for adenocarcinoma with signet ring features, consistent with metastatic disease.  He was discharged to home on 04/27/2014. He did feel a slightly informant of his abdominal bloating after the paracentesis, but not much improvement other symptoms. He had Mediport placement and the second paracentesis was again 4 L fluids removal on 05/02/2014. His appetite is low, he drinks ensure 3 bottles a day, some soup and a very little other foods. His energy level is also low, but able to take care of all his daily needs, and move around with about much difficulty. His abdominal discomfort/pain are relatively controlled by morphine 15 mg at night and tramadol 2-3 times during the day. He denies any fever, chills, cough, chest pain or any in the other discomfort.  INTERIM HISTORY: He returns for follow up. He tolerated the first cycle well overall, he had mild nausea, no vomiting.  No fever or chills. He feels the abdominla bloating is  better, he had last paracentesis on 12/31. He is also eating a little bit more than before, and into better, able to move around and tolerate more activity.  MEDICAL HISTORY:  Past Medical History  Diagnosis Date  . Medical history non-contributory     SURGICAL HISTORY: Past Surgical History  Procedure Laterality Date  . Squamous cell carcinoma excision  approx March 2015    removed from top of head  . Esophagogastroduodenoscopy (egd) with propofol N/A 04/25/2014    Procedure: ESOPHAGOGASTRODUODENOSCOPY (EGD) WITH PROPOFOL;  Surgeon: Milus Banister, MD;  Location: [facility] ENDOSCOPY;  Service: Endoscopy;  Laterality: N/A;    SOCIAL HISTORY: History   Social History  . Marital Status: Single    Spouse Name: N/A    Number of Children: N/A  . Years of Education: N/A   Occupational History  . Not on file.   Social History Main Topics  . Smoking status: Never Smoker   . Smokeless tobacco: Never Used  . Alcohol Use: No  . Drug Use: No  . Sexual Activity: Not on file   Other Topics Concern  . Not on file   Social History Narrative    FAMILY HISTORY: Family History  Problem Relation Age of Onset  . Breast cancer Sister 80   . Hypertension Mother   . Hypertension Father   Maternal aunt breast cancer at age of 36 Maternal cousin ovarian cancer at age of 45 Maternal ancle prostate cancer age of 48   ALLERGIES:  has No Known Allergies.  MEDICATIONS:  Current Outpatient Prescriptions  Medication Sig Dispense Refill  . lidocaine-prilocaine (EMLA) cream Apply 1 application topically as needed. 30 g 0  . mirtazapine (REMERON) 15 MG tablet Take 1 tablet (15 mg total) by mouth at bedtime. 30 tablet 1  . pantoprazole (PROTONIX) 40 MG tablet Take 1 tablet (40 mg total) by mouth daily. 30 tablet 0  . prochlorperazine (COMPAZINE) 10 MG tablet Take 1 tablet (10 mg total) by mouth every 6 (six) hours as needed for nausea or vomiting. 30 tablet 3  . traMADol (ULTRAM) 50 MG tablet Take  1-2 tablets (50-100 mg total) by mouth every 6 (six) hours as needed for moderate pain. 30 tablet 0  . docusate sodium 100 MG CAPS Take 100 mg by mouth 2 (two) times daily. (Patient not taking: Reported on 04/30/2014) 10 capsule 0  . morphine (MS CONTIN) 15 MG 12 hr tablet Take 1 tablet (15 mg total) by mouth every 12 (twelve) hours. (Patient not taking: Reported on 05/21/2014) 60 tablet 0  . ondansetron (ZOFRAN) 4 MG tablet Take 1 tablet (4 mg total) by mouth every 8 (eight) hours as needed for nausea or vomiting. (Patient not taking: Reported on 05/21/2014) 20 tablet 0   No current facility-administered medications for this visit.    REVIEW OF SYSTEMS:   Constitutional: Denies fevers, chills or abnormal night sweats, (+) weight loss and anorexia Eyes: Denies blurriness of vision, double vision or watery eyes Ears, nose, mouth, throat, and face: Denies mucositis or sore throat Respiratory: Denies cough, dyspnea or wheezes Cardiovascular: Denies palpitation, chest discomfort or lower extremity swelling Gastrointestinal:  (+) nausea, abdominal bloating and discomfort Skin: Denies abnormal skin rashes Lymphatics: Denies new lymphadenopathy or easy bruising Neurological:Denies numbness, tingling or new weaknesses Behavioral/Psych: Mood is stable, no new changes  All other systems were reviewed with the patient and are negative except those mentioned in the history.  PHYSICAL EXAMINATION: ECOG PERFORMANCE STATUS: 1  Filed Vitals:   05/21/14 0924  BP: 134/72  Pulse: 82  Temp: 98.1 F (36.7 C)  Resp: 19   Filed Weights   05/21/14 0924  Weight: 278 lb 4.8 oz (126.236 kg)    GENERAL:alert, no distress and comfortable SKIN: skin color, texture, turgor are normal, no rashes or significant lesions EYES: normal, conjunctiva are pink and non-injected, sclera clear OROPHARYNX:no exudate, no erythema and lips, buccal mucosa, and tongue normal  NECK: supple, thyroid normal size, non-tender,  without nodularity LYMPH:  no palpable lymphadenopathy in the cervical, axillary or inguinal LUNGS: clear to auscultation and percussion with normal breathing effort HEART: regular rate & rhythm and no murmurs and no lower extremity edema ABDOMEN:abdomen soft, non-tender and normal bowel sounds, probable small amount of ascites. Musculoskeletal:no cyanosis of digits and no clubbing  PSYCH: alert & oriented x 3 with fluent speech NEURO: no focal motor/sensory deficits  LABORATORY DATA:  I have reviewed the data as listed Lab Results  Component Value Date   WBC 3.2* 05/21/2014   HGB 10.4* 05/21/2014   HCT 34.3* 05/21/2014   MCV 75.5* 05/21/2014   PLT 461* 05/21/2014    Recent Labs  04/25/14 0434 04/26/14 0810 04/27/14 0453 05/06/14 1202 05/21/14 0851  NA 137 135* 139 136 142  K 4.2 3.9 4.2 4.4 3.8  CL 99 98 100  --   --   CO2 _0 GLUCOSE 88 144* 113* 140 105  BUN _1 21.2 15.7  CREATININE 0.98 0.90 0.92 1.1 0.8  CALCIUM 8.7 8.8 8.7 9.2 8.3*  GFRNONAA >90 >90 >90  --   --   GFRAA >90 >90 >90  --   --   PROT 6.3  --  6.0 6.8 5.9*  ALBUMIN 2.3*  --  2.1* 2.3* 2.4*  AST 17  --  18 46* 27  ALT 10  --  _2 ALKPHOS 82  --  101 137 111  BILITOT 0.5  --  0.5 0.48 0.30   Pathology report  Diagnosis  PERITONEAL/ASCITIC FLUID(SPECIMEN 1 OF 1 COLLECTED 04/26/14): METASTATIC ADENOCARCINOMA WITH SIGNET RING CELL FEATURES.  Stomach, biopsy, r/o cancer 04/25/14 - ADENOCARCINOMA WITH SIGNET RING FEATURES. Microscopic Comment The results were called to Dr. Ardis Hughs on 04/26/2014. (JDP:kh 04/26/14) Claudette Laws MD Pathologist, Electronic Signature (Case signed 04/26/2014)  HER-2/NEU BY CISH - NO AMPLIFICATION OF HER-2 DETECTED. RESULT RATIO OF HER2: CEP 17 SIGNALS 1.20 AVERAGE HER2 COPY NUMBER PER CELL 2.10  RADIOGRAPHIC STUDIES: I have personally reviewed the radiological images as listed and agreed with the findings in the report. Ct Chest Wo  Contrast12/24/2015    FINDINGS: Multiple bilateral pulmonary nodules/metastases, including:  --6 mm nodule in the left lower lobe (series 5/ image 21)  --8 mm nodule in the left lower lobe (series 5/ image 21)  --10 mm nodule in the right lower lobe (series 5/ image 32)  Small bilateral pleural effusions.  No pneumothorax.  Visualized thyroid is unremarkable.  The heart is normal in size.  No pericardial effusion.  11 mm short axis lower paraesophageal node (series 2/image 39). 6 mm short axis right juxta diaphragmatic node (series 2/image 36), indeterminate.  Visualized upper abdomen is notable for a mass at the GE junction, multiple large upper abdominal nodes, peritoneal carcinomatosis, upper abdominal ascites, and layering gallstones, unchanged from recent CT.  IMPRESSION: Multiple bilateral pulmonary nodules/metastases, measuring up to 10 mm in the right  lower lobe.  11 mm short axis lower paraesophageal node, suspicious for nodal metastasis.  Small bilateral pleural effusions.  Gastric adenocarcinoma with upper abdominal lymphadenopathy and peritoneal carcinomatosis, incompletely visualized.   Electronically Signed   By: Julian Hy M.D.   On: 05/02/2014 08:43   Ct Abdomen Pelvis W Contrast12/15/2015    FINDINGS: Lower chest: Several pulmonary nodules are noted in the visualized lung bases bilaterally, the largest of which measures up to 1 cm in the right lower lobe (image 12 of series 3). Profound thickening of the distal esophagus extending beyond the gastroesophageal junction into the proximal stomach.  Hepatobiliary: No cystic or solid hepatic lesions. No intra or extrahepatic biliary ductal dilatation. Gallbladder is remarkable for some amorphous increased attenuation dependently, likely to reflect some biliary sludge.  Pancreas: Fatty atrophy in the pancreas.  Otherwise, unremarkable.  Spleen: Unremarkable.  Adrenals/Urinary Tract: Bilateral adrenal glands and the right kidney are unremarkable  in appearance. Exophytic 2.4 cm low-attenuation lesion in the anterior aspect of the upper pole of the left kidney is compatible with a simple cyst. No hydroureteronephrosis. Urinary bladder is normal in appearance.  Stomach/Bowel: Marked thickening of the gastric wall at the gastroesophageal junction, extending into the cardia and fundus of the stomach, most evident along the lesser curvature. This is contiguous with the previously mentioned esophageal wall thickening. No pathologic dilatation of small bowel or colon.  Vascular/Lymphatic: Extensive upper abdominal lymphadenopathy, predominantly in the gastrohepatic ligament and celiac axis nodal stations, with the largest single lymph node measuring up to 3.7 x 3.0 cm in the celiac axis nodal station (image 27 of series 2). Portacaval lymphadenopathy measuring up to 1.7 cm. Notably, many of the enlarged celiac axis lymph nodes appear partially calcified, which could suggest a mucinous subtype of neoplasm. Enlarged left paraaortic retroperitoneal lymph nodes measuring up to 2.4 x 4.2 cm adjacent to the left renal vein (image 33 of series 2). No significant atherosclerotic disease in the abdominal or pelvic vasculature.  Reproductive: Prostate gland and seminal vesicles are unremarkable in appearance.  Other: Moderate volume of ascites, presumably malignant. Extensive soft tissue nodularity throughout the omentum, compatible with omental caking and widespread peritoneal metastasis. Additionally, there are other areas of enhancing nodularity along the peritoneal surface. Tiny ventral and umbilical hernias containing predominantly omental fat, including some omental implants. No pneumoperitoneum.  Musculoskeletal: There are no aggressive appearing lytic or blastic lesions noted in the visualized portions of the skeleton.   IMPRESSION:  1. Findings, as above, highly concerning for either esophageal or gastric primary malignancy, with metastatic lymphadenopathy in the  upper abdomen and retroperitoneum, widespread intraperitoneal spread of disease, malignant ascites, and multiple pulmonary nodules in the visualize lung bases concerning for pulmonary metastasis. Oncologic evaluation is recommended.  2. Additional incidental findings, as above. These results were called by telephone at the time of interpretation on 04/23/2014 at 7:53 pm to Pelzer , who verbally acknowledged these results.   Electronically Signed   By: Vinnie Langton M.D.   On: 04/23/2014 19:56   US Paracentesis  04/26/2014   INDICATION: GE junction mass, lymphadenopathy, ascites. Request is made for diagnostic and therapeutic paracentesis.  EXAM: ULTRASOUND-GUIDED DIAGNOSTIC AND THERAPEUTIC PARACENTESIS  COMPARISON:  None.  MEDICATIONS: None.  COMPLICATIONS: None immediate  TECHNIQUE: Informed written consent was obtained from the patient after a discussion of the risks, benefits and alternatives to treatment. A timeout was performed prior to the initiation of the procedure.  Initial ultrasound scanning demonstrates a large amount  of ascites within the right mid to lower abdominal quadrant. The right lmid to lower abdomen was prepped and draped in the usual sterile fashion. 1% lidocaine was used for local anesthesia. Under direct ultrasound guidance, a 19 gauge, 10-cm, Yueh catheter was introduced. An ultrasound image was saved for documentation purposed. The paracentesis was performed. The catheter was removed and a dressing was applied. The patient tolerated the procedure well without immediate post procedural complication.  FINDINGS: A total of approximately 4.5 liters of slightly turbid, yellow fluid was removed. Samples were sent to the laboratory as requested by the clinical team.  IMPRESSION: Successful ultrasound-guided diagnostic and therapeutic paracentesis yielding 4.5 liters of peritoneal fluid.  Read by: Rowe Robert, PA-C   Electronically Signed   By: Corrie Mckusick D.O.   On:  04/26/2014 15:32   EGD WITH BIOPSY 04/25/2014 ENDOSCOPIC IMPRESSION: There was a large, nearly circumferential, ulcerated, clearly malignant mass that stradles the GE junction. The vast bulk of the mass lays in the stomach, occupying about 1/3 of the stomach. The mass extends into the esophagus for about 4cm above the GE junction. Mutiple biopsies were taken from the gastric portion of the mass   I ASSESSMENT & PLAN:  39 year old gentleman without significant past medical history presents with abdominal bloating, nausea, vomiting, diarrhea, anemia and weight loss. EGD showed a large mass arising from the gastric fundus and extending into GE junction, biopsy showed adenocarcinoma with signet ring features, CT scan is consistent with peritoneal carcinomatosis and bilateral lung metastasis. Ascites cytology was positive for malignant cells.  1. 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 Unfortunately, this is widely metastatic, not curable, and overall prognosis is very poor. Literature has showed gastric adenocarcinoma with signet ring features has overall worse outcome comparing to others. His life expectancy is probably less than 6 months without treatment. -He has started first-line chemotherapy FOLFOX 2 weeks ago, tolerated well, and his symptoms have improved improved. -He still neutropenic with ANC 1000 today. Giving his good response to first cycle, I would continue her second cycle today with dose reduction and Neupogen support on day 3. Risks of neutropenia fever after chemotherapy was discussed with patient in details.  -His tumor is negative for HER-2 overexpression, no role for trastuzumab.  2. Malignant ascites -He is scheduled for repeated paracentesis on January 21. He  may not needed, it seems this ascites has decreased since he started chemotherapy.  3. Anorexia, nausea, malnutrition -Those are all related to his cancer. -cont mirtazapine 15 mg at bedtime for his anorexia. -Continue Zofran and Compazine for nausea as needed -I encouraged him to continue nutrition supplement, dietitian consult  4. Anemia, this is likely related to his cancer and blood loss from cancer -His ferritin level was normal at 171 today, no iron and saturation, likely related to his cancer. -Blood transfusion as needed during chemotherapy.   Plan #1 cycle 2 FOLFOX with dose reduction today #2 return to clinic in 2 weeks for third cycle FOLFOX, lab the day before     All questions were answered. The patient knows to call the clinic with any problems, questions or concerns. I spent 20 minutes counseling the patient face to face. The total time spent in the appointment was 25 minutes and more than 50% was on counseling.  Truitt Merle, MD 05/21/2014 9:54 AM

## 2014-05-21 NOTE — Telephone Encounter (Signed)
Gave avs & cal for Jan/Feb. °

## 2014-05-23 ENCOUNTER — Ambulatory Visit (HOSPITAL_BASED_OUTPATIENT_CLINIC_OR_DEPARTMENT_OTHER): Payer: BLUE CROSS/BLUE SHIELD

## 2014-05-23 ENCOUNTER — Ambulatory Visit: Payer: BLUE CROSS/BLUE SHIELD

## 2014-05-23 DIAGNOSIS — C169 Malignant neoplasm of stomach, unspecified: Secondary | ICD-10-CM

## 2014-05-23 DIAGNOSIS — Z5189 Encounter for other specified aftercare: Secondary | ICD-10-CM

## 2014-05-23 DIAGNOSIS — C786 Secondary malignant neoplasm of retroperitoneum and peritoneum: Secondary | ICD-10-CM

## 2014-05-23 MED ORDER — PEGFILGRASTIM INJECTION 6 MG/0.6ML ~~LOC~~
6.0000 mg | PREFILLED_SYRINGE | Freq: Once | SUBCUTANEOUS | Status: AC
  Administered 2014-05-23: 6 mg via SUBCUTANEOUS
  Filled 2014-05-23: qty 0.6

## 2014-05-23 MED ORDER — SODIUM CHLORIDE 0.9 % IJ SOLN
10.0000 mL | INTRAMUSCULAR | Status: DC | PRN
  Administered 2014-05-23: 10 mL
  Filled 2014-05-23: qty 10

## 2014-05-23 MED ORDER — HEPARIN SOD (PORK) LOCK FLUSH 100 UNIT/ML IV SOLN
500.0000 [IU] | Freq: Once | INTRAVENOUS | Status: AC | PRN
  Administered 2014-05-23: 500 [IU]
  Filled 2014-05-23: qty 5

## 2014-05-23 NOTE — Patient Instructions (Signed)
Pegfilgrastim injection What is this medicine? PEGFILGRASTIM (peg fil GRA stim) is a long-acting granulocyte colony-stimulating factor that stimulates the growth of neutrophils, a type of white blood cell important in the body's fight against infection. It is used to reduce the incidence of fever and infection in patients with certain types of cancer who are receiving chemotherapy that affects the bone marrow. This medicine may be used for other purposes; ask your health care provider or pharmacist if you have questions. COMMON BRAND NAME(S): Neulasta What should I tell my health care provider before I take this medicine? They need to know if you have any of these conditions: -latex allergy -ongoing radiation therapy -sickle cell disease -skin reactions to acrylic adhesives (On-Body Injector only) -an unusual or allergic reaction to pegfilgrastim, filgrastim, other medicines, foods, dyes, or preservatives -pregnant or trying to get pregnant -breast-feeding How should I use this medicine? This medicine is for injection under the skin. If you get this medicine at home, you will be taught how to prepare and give the pre-filled syringe or how to use the On-body Injector. Refer to the patient Instructions for Use for detailed instructions. Use exactly as directed. Take your medicine at regular intervals. Do not take your medicine more often than directed. It is important that you put your used needles and syringes in a special sharps container. Do not put them in a trash can. If you do not have a sharps container, call your pharmacist or healthcare provider to get one. Talk to your pediatrician regarding the use of this medicine in children. Special care may be needed. Overdosage: If you think you have taken too much of this medicine contact a poison control center or emergency room at once. NOTE: This medicine is only for you. Do not share this medicine with others. What if I miss a dose? It is  important not to miss your dose. Call your doctor or health care professional if you miss your dose. If you miss a dose due to an On-body Injector failure or leakage, a new dose should be administered as soon as possible using a single prefilled syringe for manual use. What may interact with this medicine? Interactions have not been studied. Give your health care provider a list of all the medicines, herbs, non-prescription drugs, or dietary supplements you use. Also tell them if you smoke, drink alcohol, or use illegal drugs. Some items may interact with your medicine. This list may not describe all possible interactions. Give your health care provider a list of all the medicines, herbs, non-prescription drugs, or dietary supplements you use. Also tell them if you smoke, drink alcohol, or use illegal drugs. Some items may interact with your medicine. What should I watch for while using this medicine? You may need blood work done while you are taking this medicine. If you are going to need a MRI, CT scan, or other procedure, tell your doctor that you are using this medicine (On-Body Injector only). What side effects may I notice from receiving this medicine? Side effects that you should report to your doctor or health care professional as soon as possible: -allergic reactions like skin rash, itching or hives, swelling of the face, lips, or tongue -dizziness -fever -pain, redness, or irritation at site where injected -pinpoint red spots on the skin -shortness of breath or breathing problems -stomach or side pain, or pain at the shoulder -swelling -tiredness -trouble passing urine Side effects that usually do not require medical attention (report to your doctor   or health care professional if they continue or are bothersome): -bone pain -muscle pain This list may not describe all possible side effects. Call your doctor for medical advice about side effects. You may report side effects to FDA at  1-800-FDA-1088. Where should I keep my medicine? Keep out of the reach of children. Store pre-filled syringes in a refrigerator between 2 and 8 degrees C (36 and 46 degrees F). Do not freeze. Keep in carton to protect from light. Throw away this medicine if it is left out of the refrigerator for more than 48 hours. Throw away any unused medicine after the expiration date. NOTE: This sheet is a summary. It may not cover all possible information. If you have questions about this medicine, talk to your doctor, pharmacist, or health care provider.  2015, [employer] Standard. (2013-07-26 16:14:05) Fluorouracil, 5FU; Diclofenac topical cream What is this medicine? FLUOROURACIL; DICLOFENAC (flure oh YOOR a sil; dye KLOE fen ak) is a combination of a topical chemotherapy agent and non-steroidal anti-inflammatory drug (NSAID). It is used on the skin to treat skin cancer and skin conditions that could become cancer. This medicine may be used for other purposes; ask your health care provider or pharmacist if you have questions. COMMON BRAND NAME(S): FLUORAC What should I tell my health care provider before I take this medicine? They need to know if you have any of these conditions: -bleeding problems -cigarette smoker -DPD enzyme deficiency -heart disease -high blood pressure -if you frequently drink alcohol containing drinks -kidney disease -liver disease -open or infected skin -stomach problems -swelling or open sores at the treatment site -recent or planned coronary artery bypass graft (CABG) surgery -an unusual or allergic reaction to fluorouracil, diclofenac, aspirin, other NSAIDs, other medicines, foods, dyes, or preservatives -pregnant or trying to get pregnant -breast-feeding How should I use this medicine? This medicine is only for use on the skin. Follow the directions on the prescription label. Wash hands before and after use. Wash affected area and gently pat dry. To apply this  medicine use a cotton-tipped applicator, or use gloves if applying with fingertips. If applied with unprotected fingertips, it is very important to wash your hands well after you apply this medicine. Avoid applying to the eyes, nose, or mouth. Apply enough medicine to cover the affected area. You can cover the area with a light gauze dressing, but do not use tight or air-tight dressings. Finish the full course prescribed by your doctor or health care professional, even if you think your condition is better. Do not stop taking except on the advice of your doctor or health care professional. Talk to your pediatrician regarding the use of this medicine in children. Special care may be needed. Overdosage: If you think you've taken too much of this medicine contact a poison control center or emergency room at once. Overdosage: If you think you have taken too much of this medicine contact a poison control center or emergency room at once. NOTE: This medicine is only for you. Do not share this medicine with others. What if I miss a dose? If you miss a dose, apply it as soon as you can. If it is almost time for your next dose, only use that dose. Do not apply extra doses. Contact your doctor or health care professional if you miss more than one dose. What may interact with this medicine? Interactions are not expected. Do not use any other skin products without telling your doctor or health care professional. This list   may not describe all possible interactions. Give your health care provider a list of all the medicines, herbs, non-prescription drugs, or dietary supplements you use. Also tell them if you smoke, drink alcohol, or use illegal drugs. Some items may interact with your medicine. What should I watch for while using this medicine? Visit your doctor or health care professional for checks on your progress. You will need to use this medicine for 2 to 6 weeks. This may be longer depending on the condition  being treated. You may not see full healing for another 1 to 2 months after you stop using the medicine. Treated areas of skin can look unsightly during and for several weeks after treatment with this medicine. This medicine can make you more sensitive to the sun. Keep out of the sun. If you cannot avoid being in the sun, wear protective clothing and use sunscreen. Do not use sun lamps or tanning beds/booths. Where should I keep my What side effects may I notice from receiving this medicine? Side effects that you should report to your doctor or health care professional as soon as possible: -allergic reactions like skin rash, itching or hives, swelling of the face, lips, or tongue -black or bloody stools, blood in the urine or vomit -blurred vision -chest pain -difficulty breathing or wheezing -redness, blistering, peeling or loosening of the skin, including inside the mouth -severe redness and swelling of normal skin -slurred speech or weakness on one side of the body -trouble passing urine or change in the amount of urine -unexplained weight gain or swelling -unusually weak or tired -yellowing of eyes or skin Side effects that usually do not require medical attention (Report these to your doctor or health care professional if they continue or are bothersome.): -increased sensitivity of the skin to sun and ultraviolet light -pain and burning of the affected area -scaling or swelling of the affected area -skin rash, itching of the affected area -tenderness This list may not describe all possible side effects. Call your doctor for medical advice about side effects. You may report side effects to FDA at 1-800-FDA-1088. Where should I keep my medicine? Keep out of the reach of children. Store at room temperature between 20 and 25 degrees C (68 and 77 degrees F). Throw away any unused medicine after the expiration date. NOTE: This sheet is a summary. It may not cover all possible information.  If you have questions about this medicine, talk to your doctor, pharmacist, or health care provider.  2015, [employer]. (2013-08-27 11:09:58)  

## 2014-05-29 ENCOUNTER — Encounter: Payer: Self-pay | Admitting: Hematology

## 2014-05-30 ENCOUNTER — Ambulatory Visit (HOSPITAL_COMMUNITY)
Admission: RE | Admit: 2014-05-30 | Discharge: 2014-05-30 | Disposition: A | Discharge status: Home / Self Care | Payer: BLUE CROSS/BLUE SHIELD | Source: Ambulatory Visit | Attending: Hematology | Admitting: Hematology

## 2014-05-30 ENCOUNTER — Telehealth: Payer: Self-pay | Admitting: *Deleted

## 2014-05-30 DIAGNOSIS — Z85028 Personal history of other malignant neoplasm of stomach: Secondary | ICD-10-CM | POA: Insufficient documentation

## 2014-05-30 DIAGNOSIS — R188 Other ascites: Secondary | ICD-10-CM | POA: Insufficient documentation

## 2014-05-30 DIAGNOSIS — C169 Malignant neoplasm of stomach, unspecified: Secondary | ICD-10-CM

## 2014-05-30 NOTE — Telephone Encounter (Signed)
Sent a reply message to pt via [employer].

## 2014-05-30 NOTE — Procedures (Signed)
Successful US guided paracentesis from RUQ.  Yielded 1.1 liters of clear serous fluid.  No immediate complications.  Pt tolerated well.   Specimen was not sent for labs.  Tsosie Billing D PA-C 05/30/2014 12:06 PM

## 2014-05-31 ENCOUNTER — Telehealth: Payer: Self-pay | Admitting: Hematology

## 2014-05-31 NOTE — Telephone Encounter (Signed)
  Hi Samuel Durham,  I got your messages. I tried calling your cell number but did not get hold of you and was not able to leave a message.  I am glad that you had a second opinion at [facility]. I am very familiar with Foundation one test, and I agree with it. Please let me know if Dr. Rushie Nyhan needs any assistance to get the biopsy tissue from here. We can certainly also send the Foundation one test directly from here if he would like to.   I am not against you using [place] in general. There is no great clinical data to support using it, but some studies demonstrate it helps patients's symptoms and tolerance to chemotherapy and overall well-being.   I'll see you next Tuesday.   Samuel Durham

## 2014-06-03 ENCOUNTER — Other Ambulatory Visit (HOSPITAL_BASED_OUTPATIENT_CLINIC_OR_DEPARTMENT_OTHER): Payer: BLUE CROSS/BLUE SHIELD

## 2014-06-03 DIAGNOSIS — C169 Malignant neoplasm of stomach, unspecified: Secondary | ICD-10-CM

## 2014-06-03 LAB — CBC WITH DIFFERENTIAL/PLATELET
BASO%: 1.2 % (ref 0.0–2.0)
BASOS ABS: 0.1 10*3/uL (ref 0.0–0.1)
EOS ABS: 0.1 10*3/uL (ref 0.0–0.5)
EOS%: 0.7 % (ref 0.0–7.0)
HEMATOCRIT: 35.9 % — AB (ref 38.4–49.9)
HGB: 10.8 g/dL — ABNORMAL LOW (ref 13.0–17.1)
LYMPH%: 24.3 % (ref 14.0–49.0)
MCH: 22.9 pg — ABNORMAL LOW (ref 27.2–33.4)
MCHC: 30.2 g/dL — ABNORMAL LOW (ref 32.0–36.0)
MCV: 75.8 fL — ABNORMAL LOW (ref 79.3–98.0)
MONO#: 0.9 10*3/uL (ref 0.1–0.9)
MONO%: 11.9 % (ref 0.0–14.0)
NEUT%: 61.9 % (ref 39.0–75.0)
NEUTROS ABS: 4.8 10*3/uL (ref 1.5–6.5)
Platelets: 253 10*3/uL (ref 140–400)
RBC: 4.74 10*6/uL (ref 4.20–5.82)
RDW: 19.8 % — AB (ref 11.0–14.6)
WBC: 7.7 10*3/uL (ref 4.0–10.3)
lymph#: 1.9 10*3/uL (ref 0.9–3.3)

## 2014-06-03 LAB — COMPREHENSIVE METABOLIC PANEL (CC13)
ALK PHOS: 115 U/L (ref 40–150)
ALT: 12 U/L (ref 0–55)
ANION GAP: 8 meq/L (ref 3–11)
AST: 14 U/L (ref 5–34)
Albumin: 2.6 g/dL — ABNORMAL LOW (ref 3.5–5.0)
BUN: 12.8 mg/dL (ref 7.0–26.0)
CHLORIDE: 106 meq/L (ref 98–109)
CO2: 28 meq/L (ref 22–29)
CREATININE: 0.8 mg/dL (ref 0.7–1.3)
Calcium: 8.3 mg/dL — ABNORMAL LOW (ref 8.4–10.4)
Glucose: 102 mg/dl (ref 70–140)
Potassium: 3.8 mEq/L (ref 3.5–5.1)
Sodium: 143 mEq/L (ref 136–145)
TOTAL PROTEIN: 5.8 g/dL — AB (ref 6.4–8.3)
Total Bilirubin: 0.25 mg/dL (ref 0.20–1.20)

## 2014-06-04 ENCOUNTER — Ambulatory Visit (HOSPITAL_BASED_OUTPATIENT_CLINIC_OR_DEPARTMENT_OTHER): Payer: BLUE CROSS/BLUE SHIELD | Admitting: Hematology

## 2014-06-04 ENCOUNTER — Other Ambulatory Visit: Payer: Self-pay

## 2014-06-04 ENCOUNTER — Ambulatory Visit (HOSPITAL_BASED_OUTPATIENT_CLINIC_OR_DEPARTMENT_OTHER): Payer: BLUE CROSS/BLUE SHIELD

## 2014-06-04 ENCOUNTER — Ambulatory Visit: Payer: BLUE CROSS/BLUE SHIELD | Admitting: Nutrition

## 2014-06-04 ENCOUNTER — Encounter: Payer: Self-pay | Admitting: Hematology

## 2014-06-04 VITALS — BP 126/73 | HR 68 | Temp 98.5°F | Resp 18 | Ht 69.0 in | Wt 276.1 lb

## 2014-06-04 DIAGNOSIS — C169 Malignant neoplasm of stomach, unspecified: Secondary | ICD-10-CM

## 2014-06-04 DIAGNOSIS — R630 Anorexia: Secondary | ICD-10-CM

## 2014-06-04 DIAGNOSIS — C786 Secondary malignant neoplasm of retroperitoneum and peritoneum: Secondary | ICD-10-CM

## 2014-06-04 DIAGNOSIS — D6481 Anemia due to antineoplastic chemotherapy: Secondary | ICD-10-CM

## 2014-06-04 DIAGNOSIS — C7800 Secondary malignant neoplasm of unspecified lung: Secondary | ICD-10-CM | POA: Diagnosis not present

## 2014-06-04 DIAGNOSIS — Z5111 Encounter for antineoplastic chemotherapy: Secondary | ICD-10-CM | POA: Diagnosis not present

## 2014-06-04 DIAGNOSIS — J910 Malignant pleural effusion: Secondary | ICD-10-CM

## 2014-06-04 DIAGNOSIS — R110 Nausea: Secondary | ICD-10-CM

## 2014-06-04 DIAGNOSIS — E46 Unspecified protein-calorie malnutrition: Secondary | ICD-10-CM

## 2014-06-04 DIAGNOSIS — D630 Anemia in neoplastic disease: Secondary | ICD-10-CM

## 2014-06-04 DIAGNOSIS — D509 Iron deficiency anemia, unspecified: Secondary | ICD-10-CM

## 2014-06-04 MED ORDER — DEXTROSE 5 % IV SOLN
Freq: Once | INTRAVENOUS | Status: AC
  Administered 2014-06-04: 11:00:00 via INTRAVENOUS

## 2014-06-04 MED ORDER — PROCHLORPERAZINE MALEATE 10 MG PO TABS
ORAL_TABLET | ORAL | Status: AC
  Filled 2014-06-04: qty 1

## 2014-06-04 MED ORDER — SODIUM CHLORIDE 0.9 % IJ SOLN
10.0000 mL | INTRAMUSCULAR | Status: DC | PRN
  Filled 2014-06-04: qty 10

## 2014-06-04 MED ORDER — ONDANSETRON 8 MG/NS 50 ML IVPB
INTRAVENOUS | Status: AC
  Filled 2014-06-04: qty 8

## 2014-06-04 MED ORDER — PROCHLORPERAZINE MALEATE 10 MG PO TABS
10.0000 mg | ORAL_TABLET | Freq: Four times a day (QID) | ORAL | Status: DC | PRN
  Administered 2014-06-04: 10 mg via ORAL

## 2014-06-04 MED ORDER — LEUCOVORIN CALCIUM INJECTION 350 MG
400.0000 mg/m2 | Freq: Once | INTRAMUSCULAR | Status: AC
  Administered 2014-06-04: 996 mg via INTRAVENOUS
  Filled 2014-06-04: qty 49.8

## 2014-06-04 MED ORDER — OXALIPLATIN CHEMO INJECTION 100 MG/20ML
81.0000 mg/m2 | Freq: Once | INTRAVENOUS | Status: AC
  Administered 2014-06-04: 200 mg via INTRAVENOUS
  Filled 2014-06-04: qty 40

## 2014-06-04 MED ORDER — FLUOROURACIL CHEMO INJECTION 2.5 GM/50ML
400.0000 mg/m2 | Freq: Once | INTRAVENOUS | Status: AC
  Administered 2014-06-04: 1000 mg via INTRAVENOUS
  Filled 2014-06-04: qty 20

## 2014-06-04 MED ORDER — SODIUM CHLORIDE 0.9 % IV SOLN
2400.0000 mg/m2 | INTRAVENOUS | Status: DC
  Administered 2014-06-04: 6000 mg via INTRAVENOUS
  Filled 2014-06-04: qty 120

## 2014-06-04 MED ORDER — DEXAMETHASONE SODIUM PHOSPHATE 10 MG/ML IJ SOLN
10.0000 mg | Freq: Once | INTRAMUSCULAR | Status: AC
  Administered 2014-06-04: 10 mg via INTRAVENOUS

## 2014-06-04 MED ORDER — ONDANSETRON 8 MG/50ML IVPB (CHCC)
8.0000 mg | Freq: Once | INTRAVENOUS | Status: AC
  Administered 2014-06-04: 8 mg via INTRAVENOUS

## 2014-06-04 MED ORDER — DEXAMETHASONE SODIUM PHOSPHATE 10 MG/ML IJ SOLN
INTRAMUSCULAR | Status: AC
  Filled 2014-06-04: qty 1

## 2014-06-04 MED ORDER — HEPARIN SOD (PORK) LOCK FLUSH 100 UNIT/ML IV SOLN
500.0000 [IU] | Freq: Once | INTRAVENOUS | Status: DC | PRN
  Filled 2014-06-04: qty 5

## 2014-06-04 NOTE — Progress Notes (Signed)
[place]  [phone] Fax:([phone]) [phone]  Clinic New Consult Note   No care team member to display 06/04/2014  CHIEF COMPLAINTS/PURPOSE OF CONSULTATION:  Newly diagnosed metastatic gastric cancer    Gastric adenocarcinoma: Stage IV   04/23/2014 Imaging CT abdomen showed peritoneal carcinomatosis, ascites, thickning of gastric and GEJ wall, CT chest showed a few small b/l lung nodules.    04/24/2014 Tumor Marker CEA  5.6, CA19.9 13   04/25/2014 Initial Diagnosis Gastric adenocarcinoma: Stage IV   04/25/2014 Pathology Results Stomach mass biopsy showed - ADENOCARCINOMA WITH SIGNET RING FEATURES. ascites cytology (+)   04/25/2014 Procedure EGD: There was a large, nearly circumferential, ulcerated, clearly malignant mass that stradles the GE junction. The vast bulk of the mass lays in the stomach.      HISTORY OF PRESENTING ILLNESS:  Samuel Durham 39 y.o. 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 I saw him first when he was recently admitted to [facility]. He is here for the first office visit after hospital discharge.  He was  admitted on 12/15 with progressive, 4 month history of dysphagia, epigastric abdominal discomfort and bloating, early satiation accompanied by nausea, and postprandial vomiting and diarrhea. He has experienced a 30 lbs weight loss in the process. He has noted increased abdominal girth. Patient denies any shortness of breath, chest pain, GI bleed, hematuria or lower extremity swelling. He denies heavy alcohol intake. Denies tobacco abuse. Denies risk factors for HIV or hepatitis. He denies any family history of GI malignancies.  CT of the abdomen and pelvis with contrast on 12/15 revealed several pulmonary nodules at the bases bilaterally, profound thickening of the distal esophagus extending beyond the gastroesophageal junction into the proximal stomach,cardia and fundus of the stomach, most  evident along the lesser curvature. No pathologic dilatation of small bowel or colon. Extensive upper abdominal and retroperitoneal lymphadenopathy was visualized. Moderate volume of ascites, presumably malignant, was noted. Extensive soft tissue nodularity throughout the omentum, compatible with omental caking and widespread peritoneal metastasis. Additionally, there are other areas of enhancing nodularity along the peritoneal surface. Tiny ventral and umbilical hernias containing predominantly omental fat, including some omental implants. There are no aggressive appearing lytic or blastic lesions noted in the visualized portions of the skeleton.  He underwent an EGD by Dr. Ardis Hughs on 04/25/2014, which showed a large fungating mass that straddles the GE junction and occupying the upper 1/3 part of the stomach. Multiple biopsy was taken which showed adenocarcinoma with signet ring features. He also underwent a paracentesis with 4.5 L fluid is removed on 04/26/2014. Cytology was positive for adenocarcinoma with signet ring features, consistent with metastatic disease.  He was discharged to home on 04/27/2014. He did feel a slightly informant of his abdominal bloating after the paracentesis, but not much improvement other symptoms. He had Mediport placement and the second paracentesis was again 4 L fluids removal on 05/02/2014. His appetite is low, he drinks ensure 3 bottles a day, some soup and a very little other foods. His energy level is also low, but able to take care of all his daily needs, and move around with about much difficulty. His abdominal discomfort/pain are relatively controlled by morphine 15 mg at night and tramadol 2-3 times during the day. He denies any fever, chills, cough, chest pain or any in the other discomfort.  INTERIM HISTORY: He returns for follow up. He is doing well overall. He has better appetite and eats very well  now. He had paracentesis 1 week ago and 1 L of ascites was  removed. His abdominal loading is much better improved since he started chemotherapy, he has more energy and able to do most things daily. He went to [facility] had a second opinion about his gastric cancer. He is going to see a Dietitian and this Friday over there.  MEDICAL HISTORY:  Past Medical History  Diagnosis Date  . Medical history non-contributory     SURGICAL HISTORY: Past Surgical History  Procedure Laterality Date  . Squamous cell carcinoma excision  approx March 2015    removed from top of head  . Esophagogastroduodenoscopy (egd) with propofol N/A 04/25/2014    Procedure: ESOPHAGOGASTRODUODENOSCOPY (EGD) WITH PROPOFOL;  Surgeon: Milus Banister, MD;  Location: [facility] ENDOSCOPY;  Service: Endoscopy;  Laterality: N/A;    SOCIAL HISTORY: History   Social History  . Marital Status: Single    Spouse Name: N/A    Number of Children: N/A  . Years of Education: N/A   Occupational History  . Not on file.   Social History Main Topics  . Smoking status: Never Smoker   . Smokeless tobacco: Never Used  . Alcohol Use: No  . Drug Use: No  . Sexual Activity: Not on file   Other Topics Concern  . Not on file   Social History Narrative    FAMILY HISTORY: Family History  Problem Relation Age of Onset  . Breast cancer Sister 33   . Hypertension Mother   . Hypertension Father   Maternal aunt breast cancer at age of 69 Maternal cousin ovarian cancer at age of 23 Maternal ancle prostate cancer age of 48   ALLERGIES:  has No Known Allergies.  MEDICATIONS:  Current Outpatient Prescriptions  Medication Sig Dispense Refill  . docusate sodium 100 MG CAPS Take 100 mg by mouth 2 (two) times daily. (Patient not taking: Reported on 04/30/2014) 10 capsule 0  . lidocaine-prilocaine (EMLA) cream Apply 1 application topically as needed. 30 g 0  . mirtazapine (REMERON) 15 MG tablet Take 1 tablet (15 mg total) by mouth at bedtime. 30 tablet 1  . morphine (MS  CONTIN) 15 MG 12 hr tablet Take 1 tablet (15 mg total) by mouth every 12 (twelve) hours. (Patient not taking: Reported on 05/21/2014) 60 tablet 0  . ondansetron (ZOFRAN) 4 MG tablet Take 1 tablet (4 mg total) by mouth every 8 (eight) hours as needed for nausea or vomiting. (Patient not taking: Reported on 05/21/2014) 20 tablet 0  . pantoprazole (PROTONIX) 40 MG tablet Take 1 tablet (40 mg total) by mouth daily. 30 tablet 0  . prochlorperazine (COMPAZINE) 10 MG tablet Take 1 tablet (10 mg total) by mouth every 6 (six) hours as needed for nausea or vomiting. 30 tablet 3  . traMADol (ULTRAM) 50 MG tablet Take 1-2 tablets (50-100 mg total) by mouth every 6 (six) hours as needed for moderate pain. 30 tablet 0   No current facility-administered medications for this visit.    REVIEW OF SYSTEMS:   Constitutional: Denies fevers, chills or abnormal night sweats, (+) weight loss and anorexia Eyes: Denies blurriness of vision, double vision or watery eyes Ears, nose, mouth, throat, and face: Denies mucositis or sore throat Respiratory: Denies cough, dyspnea or wheezes Cardiovascular: Denies palpitation, chest discomfort or lower extremity swelling Gastrointestinal:  Less nausea, abdominal bloating and discomfort Skin: Denies abnormal skin rashes Lymphatics: Denies new lymphadenopathy or easy bruising Neurological:Denies numbness, tingling or new  weaknesses Behavioral/Psych: Mood is stable, no new changes  All other systems were reviewed with the patient and are negative except those mentioned in the history.  PHYSICAL EXAMINATION: ECOG PERFORMANCE STATUS: 1  There were no vitals filed for this visit. There were no vitals filed for this visit.  GENERAL:alert, no distress and comfortable SKIN: skin color, texture, turgor are normal, no rashes or significant lesions EYES: normal, conjunctiva are pink and non-injected, sclera clear OROPHARYNX:no exudate, no erythema and lips, buccal mucosa, and tongue  normal  NECK: supple, thyroid normal size, non-tender, without nodularity LYMPH:  no palpable lymphadenopathy in the cervical, axillary or inguinal LUNGS: clear to auscultation and percussion with normal breathing effort HEART: regular rate & rhythm and no murmurs and no lower extremity edema ABDOMEN:abdomen soft, non-tender and normal bowel sounds, probable small amount of ascites. Musculoskeletal:no cyanosis of digits and no clubbing  PSYCH: alert & oriented x 3 with fluent speech NEURO: no focal motor/sensory deficits  LABORATORY DATA:  I have reviewed the data as listed Lab Results  Component Value Date   WBC 7.7 06/03/2014   HGB 10.8* 06/03/2014   HCT 35.9* 06/03/2014   MCV 75.8* 06/03/2014   PLT 253 06/03/2014    Recent Labs  04/25/14 0434 04/26/14 0810 04/27/14 0453 05/06/14 1202 05/21/14 0851 06/03/14 0952  NA 137 135* 139 136 142 143  K 4.2 3.9 4.2 4.4 3.8 3.8  CL 99 98 100  --   --   --   CO2 _0 GLUCOSE 88 144* 113* 140 105 102  BUN _1 21.2 15.7 12.8  CREATININE 0.98 0.90 0.92 1.1 0.8 0.8  CALCIUM 8.7 8.8 8.7 9.2 8.3* 8.3*  GFRNONAA >90 >90 >90  --   --   --   GFRAA >90 >90 >90  --   --   --   PROT 6.3  --  6.0 6.8 5.9* 5.8*  ALBUMIN 2.3*  --  2.1* 2.3* 2.4* 2.6*  AST 17  --  18 46* 27 14  ALT 10  --  _2 ALKPHOS 82  --  101 137 111 115  BILITOT 0.5  --  0.5 0.48 0.30 0.25   Pathology report  Diagnosis  PERITONEAL/ASCITIC FLUID(SPECIMEN 1 OF 1 COLLECTED 04/26/14): METASTATIC ADENOCARCINOMA WITH SIGNET RING CELL FEATURES.  Stomach, biopsy, r/o cancer 04/25/14 - ADENOCARCINOMA WITH SIGNET RING FEATURES. Microscopic Comment The results were called to Dr. Ardis Hughs on 04/26/2014. (JDP:kh 04/26/14) Claudette Laws MD Pathologist, Electronic Signature (Case signed 04/26/2014)  HER-2/NEU BY CISH - NO AMPLIFICATION OF HER-2 DETECTED. RESULT RATIO OF HER2: CEP 17 SIGNALS 1.20 AVERAGE HER2 COPY NUMBER PER CELL 2.10  RADIOGRAPHIC  STUDIES: I have personally reviewed the radiological images as listed and agreed with the findings in the report. Ct Chest Wo Contrast12/24/2015    FINDINGS: Multiple bilateral pulmonary nodules/metastases, including:  --6 mm nodule in the left lower lobe (series 5/ image 21)  --8 mm nodule in the left lower lobe (series 5/ image 21)  --10 mm nodule in the right lower lobe (series 5/ image 32)  Small bilateral pleural effusions.  No pneumothorax.  Visualized thyroid is unremarkable.  The heart is normal in size.  No pericardial effusion.  11 mm short axis lower paraesophageal node (series 2/image 39). 6 mm short axis right juxta diaphragmatic node (series 2/image 36), indeterminate.  Visualized upper abdomen is notable for a mass at the GE junction, multiple large upper  abdominal nodes, peritoneal carcinomatosis, upper abdominal ascites, and layering gallstones, unchanged from recent CT.  IMPRESSION: Multiple bilateral pulmonary nodules/metastases, measuring up to 10 mm in the right lower lobe.  11 mm short axis lower paraesophageal node, suspicious for nodal metastasis.  Small bilateral pleural effusions.  Gastric adenocarcinoma with upper abdominal lymphadenopathy and peritoneal carcinomatosis, incompletely visualized.   Electronically Signed   By: Julian Hy M.D.   On: 05/02/2014 08:43   Ct Abdomen Pelvis W Contrast12/15/2015    FINDINGS: Lower chest: Several pulmonary nodules are noted in the visualized lung bases bilaterally, the largest of which measures up to 1 cm in the right lower lobe (image 12 of series 3). Profound thickening of the distal esophagus extending beyond the gastroesophageal junction into the proximal stomach.  Hepatobiliary: No cystic or solid hepatic lesions. No intra or extrahepatic biliary ductal dilatation. Gallbladder is remarkable for some amorphous increased attenuation dependently, likely to reflect some biliary sludge.  Pancreas: Fatty atrophy in the pancreas.   Otherwise, unremarkable.  Spleen: Unremarkable.  Adrenals/Urinary Tract: Bilateral adrenal glands and the right kidney are unremarkable in appearance. Exophytic 2.4 cm low-attenuation lesion in the anterior aspect of the upper pole of the left kidney is compatible with a simple cyst. No hydroureteronephrosis. Urinary bladder is normal in appearance.  Stomach/Bowel: Marked thickening of the gastric wall at the gastroesophageal junction, extending into the cardia and fundus of the stomach, most evident along the lesser curvature. This is contiguous with the previously mentioned esophageal wall thickening. No pathologic dilatation of small bowel or colon.  Vascular/Lymphatic: Extensive upper abdominal lymphadenopathy, predominantly in the gastrohepatic ligament and celiac axis nodal stations, with the largest single lymph node measuring up to 3.7 x 3.0 cm in the celiac axis nodal station (image 27 of series 2). Portacaval lymphadenopathy measuring up to 1.7 cm. Notably, many of the enlarged celiac axis lymph nodes appear partially calcified, which could suggest a mucinous subtype of neoplasm. Enlarged left paraaortic retroperitoneal lymph nodes measuring up to 2.4 x 4.2 cm adjacent to the left renal vein (image 33 of series 2). No significant atherosclerotic disease in the abdominal or pelvic vasculature.  Reproductive: Prostate gland and seminal vesicles are unremarkable in appearance.  Other: Moderate volume of ascites, presumably malignant. Extensive soft tissue nodularity throughout the omentum, compatible with omental caking and widespread peritoneal metastasis. Additionally, there are other areas of enhancing nodularity along the peritoneal surface. Tiny ventral and umbilical hernias containing predominantly omental fat, including some omental implants. No pneumoperitoneum.  Musculoskeletal: There are no aggressive appearing lytic or blastic lesions noted in the visualized portions of the skeleton.   IMPRESSION:   1. Findings, as above, highly concerning for either esophageal or gastric primary malignancy, with metastatic lymphadenopathy in the upper abdomen and retroperitoneum, widespread intraperitoneal spread of disease, malignant ascites, and multiple pulmonary nodules in the visualize lung bases concerning for pulmonary metastasis. Oncologic evaluation is recommended.  2. Additional incidental findings, as above. These results were called by telephone at the time of interpretation on 04/23/2014 at 7:53 pm to [place] , who verbally acknowledged these results.   Electronically Signed   By: Vinnie Langton M.D.   On: 04/23/2014 19:56   US Paracentesis  04/26/2014   INDICATION: GE junction mass, lymphadenopathy, ascites. Request is made for diagnostic and therapeutic paracentesis.  EXAM: ULTRASOUND-GUIDED DIAGNOSTIC AND THERAPEUTIC PARACENTESIS  COMPARISON:  None.  MEDICATIONS: None.  COMPLICATIONS: None immediate  TECHNIQUE: Informed written consent was obtained from the patient after a  discussion of the risks, benefits and alternatives to treatment. A timeout was performed prior to the initiation of the procedure.  Initial ultrasound scanning demonstrates a large amount of ascites within the right mid to lower abdominal quadrant. The right lmid to lower abdomen was prepped and draped in the usual sterile fashion. 1% lidocaine was used for local anesthesia. Under direct ultrasound guidance, a 19 gauge, 10-cm, Yueh catheter was introduced. An ultrasound image was saved for documentation purposed. The paracentesis was performed. The catheter was removed and a dressing was applied. The patient tolerated the procedure well without immediate post procedural complication.  FINDINGS: A total of approximately 4.5 liters of slightly turbid, yellow fluid was removed. Samples were sent to the laboratory as requested by the clinical team.  IMPRESSION: Successful ultrasound-guided diagnostic and therapeutic paracentesis  yielding 4.5 liters of peritoneal fluid.  Read by: Rowe Robert, PA-C   Electronically Signed   By: Corrie Mckusick D.O.   On: 04/26/2014 15:32   EGD WITH BIOPSY 04/25/2014 ENDOSCOPIC IMPRESSION: There was a large, nearly circumferential, ulcerated, clearly malignant mass that stradles the GE junction. The vast bulk of the mass lays in the stomach, occupying about 1/3 of the stomach. The mass extends into the esophagus for about 4cm above the GE junction. Mutiple biopsies were taken from the gastric portion of the mass   I ASSESSMENT & PLAN:  39 year old gentleman without significant past medical history presents with abdominal bloating, nausea, vomiting, diarrhea, anemia and weight loss. EGD showed a large mass arising from the gastric fundus and extending into GE junction, biopsy showed adenocarcinoma with signet ring features, CT scan is consistent with peritoneal carcinomatosis and bilateral lung metastasis. Ascites cytology was positive for malignant cells.  1. 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 Unfortunately, this is widely metastatic, not curable, and overall prognosis is very poor. Literature has showed gastric adenocarcinoma with signet ring features has overall worse outcome comparing to others. His life expectancy is probably less than 6 months without treatment. -He has started first-line chemotherapy FOLFOX, tolerated well, and his symptoms have much  improved. -His counts has recovered well, we'll proceed a third cycle of FOLFOX with for dose today. No Neulasta with this cycle. -His tumor is negative for HER-2 overexpression, no role for trastuzumab. -He will have [facility] one test on his tumor, which was requested by his oncologist Dr. Rushie Nyhan at [facility]. -Repeat staging CT after 4 cycles of chemotherapy -he is interested in pursuing [facility] in [place], which is a intravenous infusion for total of 2 weeks. I'm okay with it, he will probably need a chemotherapy break during that time. He will likely due after 3 months of chemotherapy.  2. Malignant ascites -Much improved since he started chemotherapy. -Paracentesis as needed in the future.  3. Anorexia, nausea, malnutrition -Those are all related to his cancer, his symptom has much improved since he started chemotherapy. -cont mirtazapine 15 mg at bedtime for his anorexia. -Continue Zofran and Compazine for nausea as needed -I encouraged him to continue nutrition supplement, dietitian consult  4. Anemia, multifactorial (iron deficient anemia, cancer related and chemotherapy induced ) -His ferritin level was normal at 171, but he has low iron and saturation, likely some degree of iron deficient anemia. -I'll give him IV Feraheme 510 mg twice, 2 weeks apart -  Blood transfusion as needed during chemotherapy.    Plan #1 cycle 3 FOLFOX today, cycle 4 in 2 weeks. No Neulasta with this cycle, we'll decide if he needs Neulasta with cycle 4 #2 CT chest, abdomen and pelvis with IV contrast after 4th cycle chemotherapy #3 return to clinic in 4 weeks to discuss restaging scan #4 IV Feraheme this week and in 2 weeks   Patient and his family members had many questions. All questions were answered. The patient knows to call the clinic with any problems, questions or concerns. I spent 30 minutes counseling the patient face to face. The total time spent in the appointment was 40  minutes and more than 50% was on counseling.     Truitt Merle, MD 06/04/2014 9:53 AM

## 2014-06-04 NOTE — Progress Notes (Signed)
Patient was identified to be at risk for malnutrition on the MST secondary to poor appetite and weight loss.  Patient is a 39 year old male diagnosed with metastatic gastric cancer with malignant ascites.  Past medical history is noncontributory.  Medications include Zofran and Compazine.  Labs include albumin 2.6, and total protein 5.8 on January 25.  Height: 69 inches. Weight: 276.2 pounds January 26. Usual body weight: 380 pounds per patient.  Several years ago. BMI: 40.9.  Patient reports he was attempting weight loss prior to diagnosis using [employer]. Reports "most" of weight loss occurred during workup for gastric cancer. Patient's history reflects several paracentesis producing approximately 5 L of fluid.   Patient reports last paracentesis produced 1.1 L fluid.   Patient reports decreased oral intake but is trying to eat healthier diet. Patient has experienced dysphasia and weight loss. Patient's family interested in healthier diet for patient and "low sugar" oral nutrition supplements. Dietary recall reveals patient eats soft foods in small amounts.  Nutrition diagnosis: Inadequate oral intake related to metastatic gastric cancer as evidenced by 27% weight loss from usual body weight.  Intervention: Patient educated to consume adequate calories and high protein foods in small frequent meals and snacks. Provided fact sheet on increasing calories and protein. Provided basic education on healthy diet but discouraged patient from attempting weight loss during treatment. Recommended patient focus on nutrient dense foods. Provided options for oral nutrition supplements that were lower in concentrated sugars. Questions were answered.  Teach back method used.  Contact information was provided.  Monitoring, evaluation, goals: Patient will consume adequate calories and protein to minimize nutrition impact symptoms and minimize weight loss.  Next visit: Will be scheduled with  upcoming treatments.  Patient to contact me by phone if needed.    **Disclaimer: This note was dictated with voice recognition software. Similar sounding words can inadvertently be transcribed and this note may contain transcription errors which may not have been corrected upon publication of note.**

## 2014-06-04 NOTE — Patient Instructions (Signed)
[facility] Discharge Instructions for Patients Receiving Chemotherapy  Today you received the following chemotherapy agents: Oxaliplatin, Leucovorin, and Adrucil.  To help prevent nausea and vomiting after your treatment, we encourage you to take your nausea medication as prescribed.   If you develop nausea and vomiting that is not controlled by your nausea medication, call the clinic.   BELOW ARE SYMPTOMS THAT SHOULD BE REPORTED IMMEDIATELY:  *FEVER GREATER THAN 100.5 F  *CHILLS WITH OR WITHOUT FEVER  NAUSEA AND VOMITING THAT IS NOT CONTROLLED WITH YOUR NAUSEA MEDICATION  *UNUSUAL SHORTNESS OF BREATH  *UNUSUAL BRUISING OR BLEEDING  TENDERNESS IN MOUTH AND THROAT WITH OR WITHOUT PRESENCE OF ULCERS  *URINARY PROBLEMS  *BOWEL PROBLEMS  UNUSUAL RASH Items with * indicate a potential emergency and should be followed up as soon as possible.  Feel free to call the clinic you have any questions or concerns. The clinic phone number is [phone].    

## 2014-06-05 ENCOUNTER — Telehealth: Payer: Self-pay | Admitting: Hematology

## 2014-06-05 ENCOUNTER — Telehealth: Payer: Self-pay | Admitting: *Deleted

## 2014-06-05 NOTE — Telephone Encounter (Signed)
Per staff message and POF I have scheduled appts. Advised scheduler of appts. JMW  

## 2014-06-05 NOTE — Telephone Encounter (Signed)
Confirmed appointments for January. Will pick up new calendar.

## 2014-06-06 ENCOUNTER — Ambulatory Visit (HOSPITAL_BASED_OUTPATIENT_CLINIC_OR_DEPARTMENT_OTHER): Payer: BLUE CROSS/BLUE SHIELD

## 2014-06-06 ENCOUNTER — Ambulatory Visit: Payer: BLUE CROSS/BLUE SHIELD

## 2014-06-06 DIAGNOSIS — D509 Iron deficiency anemia, unspecified: Secondary | ICD-10-CM

## 2014-06-06 DIAGNOSIS — C169 Malignant neoplasm of stomach, unspecified: Secondary | ICD-10-CM

## 2014-06-06 DIAGNOSIS — Z5189 Encounter for other specified aftercare: Secondary | ICD-10-CM

## 2014-06-06 DIAGNOSIS — C786 Secondary malignant neoplasm of retroperitoneum and peritoneum: Secondary | ICD-10-CM

## 2014-06-06 DIAGNOSIS — C7800 Secondary malignant neoplasm of unspecified lung: Secondary | ICD-10-CM

## 2014-06-06 MED ORDER — SODIUM CHLORIDE 0.9 % IJ SOLN
10.0000 mL | INTRAMUSCULAR | Status: DC | PRN
  Administered 2014-06-06: 10 mL
  Filled 2014-06-06: qty 10

## 2014-06-06 MED ORDER — SODIUM CHLORIDE 0.9 % IJ SOLN
10.0000 mL | INTRAMUSCULAR | Status: DC | PRN
  Filled 2014-06-06: qty 10

## 2014-06-06 MED ORDER — HEPARIN SOD (PORK) LOCK FLUSH 100 UNIT/ML IV SOLN
500.0000 [IU] | Freq: Once | INTRAVENOUS | Status: DC | PRN
  Filled 2014-06-06: qty 5

## 2014-06-06 MED ORDER — SODIUM CHLORIDE 0.9 % IV SOLN
510.0000 mg | Freq: Once | INTRAVENOUS | Status: AC
  Administered 2014-06-06: 510 mg via INTRAVENOUS
  Filled 2014-06-06: qty 17

## 2014-06-06 MED ORDER — HEPARIN SOD (PORK) LOCK FLUSH 100 UNIT/ML IV SOLN
500.0000 [IU] | Freq: Once | INTRAVENOUS | Status: AC | PRN
  Administered 2014-06-06: 500 [IU]
  Filled 2014-06-06: qty 5

## 2014-06-06 MED ORDER — PEGFILGRASTIM INJECTION 6 MG/0.6ML ~~LOC~~
6.0000 mg | PREFILLED_SYRINGE | Freq: Once | SUBCUTANEOUS | Status: AC
  Administered 2014-06-06: 6 mg via SUBCUTANEOUS
  Filled 2014-06-06: qty 0.6

## 2014-06-06 MED ORDER — SODIUM CHLORIDE 0.9 % IV SOLN
Freq: Once | INTRAVENOUS | Status: AC
  Administered 2014-06-06: 13:00:00 via INTRAVENOUS

## 2014-06-06 MED ORDER — SODIUM CHLORIDE 0.9 % IJ SOLN
10.0000 mL | Freq: Once | INTRAMUSCULAR | Status: AC
  Administered 2014-06-06: 10 mL via INTRAVENOUS
  Filled 2014-06-06: qty 10

## 2014-06-06 NOTE — Patient Instructions (Signed)

## 2014-06-06 NOTE — Progress Notes (Signed)
Injection given by infusion nurse 

## 2014-06-06 NOTE — Patient Instructions (Signed)
Pegfilgrastim injection What is this medicine? PEGFILGRASTIM (peg fil GRA stim) is a long-acting granulocyte colony-stimulating factor that stimulates the growth of neutrophils, a type of white blood cell important in the body's fight against infection. It is used to reduce the incidence of fever and infection in patients with certain types of cancer who are receiving chemotherapy that affects the bone marrow. This medicine may be used for other purposes; ask your health care provider or pharmacist if you have questions. COMMON BRAND NAME(S): Neulasta What should I tell my health care provider before I take this medicine? They need to know if you have any of these conditions: -latex allergy -ongoing radiation therapy -sickle cell disease -skin reactions to acrylic adhesives (On-Body Injector only) -an unusual or allergic reaction to pegfilgrastim, filgrastim, other medicines, foods, dyes, or preservatives -pregnant or trying to get pregnant -breast-feeding How should I use this medicine? This medicine is for injection under the skin. If you get this medicine at home, you will be taught how to prepare and give the pre-filled syringe or how to use the On-body Injector. Refer to the patient Instructions for Use for detailed instructions. Use exactly as directed. Take your medicine at regular intervals. Do not take your medicine more often than directed. It is important that you put your used needles and syringes in a special sharps container. Do not put them in a trash can. If you do not have a sharps container, call your pharmacist or healthcare provider to get one. Talk to your pediatrician regarding the use of this medicine in children. Special care may be needed. Overdosage: If you think you have taken too much of this medicine contact a poison control center or emergency room at once. NOTE: This medicine is only for you. Do not share this medicine with others. What if I miss a dose? It is  important not to miss your dose. Call your doctor or health care professional if you miss your dose. If you miss a dose due to an On-body Injector failure or leakage, a new dose should be administered as soon as possible using a single prefilled syringe for manual use. What may interact with this medicine? Interactions have not been studied. Give your health care provider a list of all the medicines, herbs, non-prescription drugs, or dietary supplements you use. Also tell them if you smoke, drink alcohol, or use illegal drugs. Some items may interact with your medicine. This list may not describe all possible interactions. Give your health care provider a list of all the medicines, herbs, non-prescription drugs, or dietary supplements you use. Also tell them if you smoke, drink alcohol, or use illegal drugs. Some items may interact with your medicine. What should I watch for while using this medicine? You may need blood work done while you are taking this medicine. If you are going to need a MRI, CT scan, or other procedure, tell your doctor that you are using this medicine (On-Body Injector only). What side effects may I notice from receiving this medicine? Side effects that you should report to your doctor or health care professional as soon as possible: -allergic reactions like skin rash, itching or hives, swelling of the face, lips, or tongue -dizziness -fever -pain, redness, or irritation at site where injected -pinpoint red spots on the skin -shortness of breath or breathing problems -stomach or side pain, or pain at the shoulder -swelling -tiredness -trouble passing urine Side effects that usually do not require medical attention (report to your doctor   or health care professional if they continue or are bothersome): -bone pain -muscle pain This list may not describe all possible side effects. Call your doctor for medical advice about side effects. You may report side effects to FDA at  1-800-FDA-1088. Where should I keep my medicine? Keep out of the reach of children. Store pre-filled syringes in a refrigerator between 2 and 8 degrees C (36 and 46 degrees F). Do not freeze. Keep in carton to protect from light. Throw away this medicine if it is left out of the refrigerator for more than 48 hours. Throw away any unused medicine after the expiration date. NOTE: This sheet is a summary. It may not cover all possible information. If you have questions about this medicine, talk to your doctor, pharmacist, or health care provider.  2015, [employer] Standard. (2013-07-26 16:14:05) Fluorouracil, 5FU; Diclofenac topical cream What is this medicine? FLUOROURACIL; DICLOFENAC (flure oh YOOR a sil; dye KLOE fen ak) is a combination of a topical chemotherapy agent and non-steroidal anti-inflammatory drug (NSAID). It is used on the skin to treat skin cancer and skin conditions that could become cancer. This medicine may be used for other purposes; ask your health care provider or pharmacist if you have questions. COMMON BRAND NAME(S): FLUORAC What should I tell my health care provider before I take this medicine? They need to know if you have any of these conditions: -bleeding problems -cigarette smoker -DPD enzyme deficiency -heart disease -high blood pressure -if you frequently drink alcohol containing drinks -kidney disease -liver disease -open or infected skin -stomach problems -swelling or open sores at the treatment site -recent or planned coronary artery bypass graft (CABG) surgery -an unusual or allergic reaction to fluorouracil, diclofenac, aspirin, other NSAIDs, other medicines, foods, dyes, or preservatives -pregnant or trying to get pregnant -breast-feeding How should I use this medicine? This medicine is only for use on the skin. Follow the directions on the prescription label. Wash hands before and after use. Wash affected area and gently pat dry. To apply this  medicine use a cotton-tipped applicator, or use gloves if applying with fingertips. If applied with unprotected fingertips, it is very important to wash your hands well after you apply this medicine. Avoid applying to the eyes, nose, or mouth. Apply enough medicine to cover the affected area. You can cover the area with a light gauze dressing, but do not use tight or air-tight dressings. Finish the full course prescribed by your doctor or health care professional, even if you think your condition is better. Do not stop taking except on the advice of your doctor or health care professional. Talk to your pediatrician regarding the use of this medicine in children. Special care may be needed. Overdosage: If you think you've taken too much of this medicine contact a poison control center or emergency room at once. Overdosage: If you think you have taken too much of this medicine contact a poison control center or emergency room at once. NOTE: This medicine is only for you. Do not share this medicine with others. What if I miss a dose? If you miss a dose, apply it as soon as you can. If it is almost time for your next dose, only use that dose. Do not apply extra doses. Contact your doctor or health care professional if you miss more than one dose. What may interact with this medicine? Interactions are not expected. Do not use any other skin products without telling your doctor or health care professional. This list   may not describe all possible interactions. Give your health care provider a list of all the medicines, herbs, non-prescription drugs, or dietary supplements you use. Also tell them if you smoke, drink alcohol, or use illegal drugs. Some items may interact with your medicine. What should I watch for while using this medicine? Visit your doctor or health care professional for checks on your progress. You will need to use this medicine for 2 to 6 weeks. This may be longer depending on the condition  being treated. You may not see full healing for another 1 to 2 months after you stop using the medicine. Treated areas of skin can look unsightly during and for several weeks after treatment with this medicine. This medicine can make you more sensitive to the sun. Keep out of the sun. If you cannot avoid being in the sun, wear protective clothing and use sunscreen. Do not use sun lamps or tanning beds/booths. Where should I keep my What side effects may I notice from receiving this medicine? Side effects that you should report to your doctor or health care professional as soon as possible: -allergic reactions like skin rash, itching or hives, swelling of the face, lips, or tongue -black or bloody stools, blood in the urine or vomit -blurred vision -chest pain -difficulty breathing or wheezing -redness, blistering, peeling or loosening of the skin, including inside the mouth -severe redness and swelling of normal skin -slurred speech or weakness on one side of the body -trouble passing urine or change in the amount of urine -unexplained weight gain or swelling -unusually weak or tired -yellowing of eyes or skin Side effects that usually do not require medical attention (Report these to your doctor or health care professional if they continue or are bothersome.): -increased sensitivity of the skin to sun and ultraviolet light -pain and burning of the affected area -scaling or swelling of the affected area -skin rash, itching of the affected area -tenderness This list may not describe all possible side effects. Call your doctor for medical advice about side effects. You may report side effects to FDA at 1-800-FDA-1088. Where should I keep my medicine? Keep out of the reach of children. Store at room temperature between 20 and 25 degrees C (68 and 77 degrees F). Throw away any unused medicine after the expiration date. NOTE: This sheet is a summary. It may not cover all possible information.  If you have questions about this medicine, talk to your doctor, pharmacist, or health care provider.  2015, [employer]. (2013-08-27 11:09:58)  

## 2014-06-07 ENCOUNTER — Telehealth: Payer: Self-pay | Admitting: *Deleted

## 2014-06-07 NOTE — Telephone Encounter (Signed)
I have moved 3/9 to 3/8 per staff message

## 2014-06-11 ENCOUNTER — Encounter (HOSPITAL_COMMUNITY): Payer: Self-pay

## 2014-06-11 ENCOUNTER — Encounter: Payer: Self-pay | Admitting: Hematology

## 2014-06-11 NOTE — Progress Notes (Signed)
Faxed clinical information to [facility] @ [phone]

## 2014-06-18 ENCOUNTER — Other Ambulatory Visit (HOSPITAL_BASED_OUTPATIENT_CLINIC_OR_DEPARTMENT_OTHER): Payer: BLUE CROSS/BLUE SHIELD

## 2014-06-18 ENCOUNTER — Ambulatory Visit (HOSPITAL_BASED_OUTPATIENT_CLINIC_OR_DEPARTMENT_OTHER): Payer: BLUE CROSS/BLUE SHIELD

## 2014-06-18 ENCOUNTER — Ambulatory Visit: Payer: BLUE CROSS/BLUE SHIELD | Admitting: Nutrition

## 2014-06-18 DIAGNOSIS — Z5111 Encounter for antineoplastic chemotherapy: Secondary | ICD-10-CM

## 2014-06-18 DIAGNOSIS — C7800 Secondary malignant neoplasm of unspecified lung: Secondary | ICD-10-CM

## 2014-06-18 DIAGNOSIS — C169 Malignant neoplasm of stomach, unspecified: Secondary | ICD-10-CM

## 2014-06-18 DIAGNOSIS — C786 Secondary malignant neoplasm of retroperitoneum and peritoneum: Secondary | ICD-10-CM

## 2014-06-18 DIAGNOSIS — D509 Iron deficiency anemia, unspecified: Secondary | ICD-10-CM

## 2014-06-18 LAB — CBC WITH DIFFERENTIAL/PLATELET
BASO%: 0.2 % (ref 0.0–2.0)
BASOS ABS: 0 10*3/uL (ref 0.0–0.1)
EOS ABS: 0.2 10*3/uL (ref 0.0–0.5)
EOS%: 2.1 % (ref 0.0–7.0)
HCT: 38 % — ABNORMAL LOW (ref 38.4–49.9)
HEMOGLOBIN: 11.6 g/dL — AB (ref 13.0–17.1)
LYMPH%: 26.3 % (ref 14.0–49.0)
MCH: 24.3 pg — ABNORMAL LOW (ref 27.2–33.4)
MCHC: 30.5 g/dL — ABNORMAL LOW (ref 32.0–36.0)
MCV: 79.5 fL (ref 79.3–98.0)
MONO#: 0.9 10*3/uL (ref 0.1–0.9)
MONO%: 10.4 % (ref 0.0–14.0)
NEUT#: 5.3 10*3/uL (ref 1.5–6.5)
NEUT%: 61 % (ref 39.0–75.0)
PLATELETS: 246 10*3/uL (ref 140–400)
RBC: 4.78 10*6/uL (ref 4.20–5.82)
RDW: 22.5 % — ABNORMAL HIGH (ref 11.0–14.6)
WBC: 8.8 10*3/uL (ref 4.0–10.3)
lymph#: 2.3 10*3/uL (ref 0.9–3.3)

## 2014-06-18 LAB — COMPREHENSIVE METABOLIC PANEL (CC13)
ALK PHOS: 111 U/L (ref 40–150)
ALT: 13 U/L (ref 0–55)
AST: 18 U/L (ref 5–34)
Albumin: 3.1 g/dL — ABNORMAL LOW (ref 3.5–5.0)
Anion Gap: 10 mEq/L (ref 3–11)
BILIRUBIN TOTAL: 0.21 mg/dL (ref 0.20–1.20)
BUN: 12.6 mg/dL (ref 7.0–26.0)
CO2: 24 mEq/L (ref 22–29)
Calcium: 8.8 mg/dL (ref 8.4–10.4)
Chloride: 108 mEq/L (ref 98–109)
Creatinine: 0.8 mg/dL (ref 0.7–1.3)
Glucose: 112 mg/dl (ref 70–140)
Potassium: 4 mEq/L (ref 3.5–5.1)
SODIUM: 142 meq/L (ref 136–145)
Total Protein: 6.4 g/dL (ref 6.4–8.3)

## 2014-06-18 MED ORDER — SODIUM CHLORIDE 0.9 % IV SOLN
510.0000 mg | Freq: Once | INTRAVENOUS | Status: AC
  Administered 2014-06-18: 510 mg via INTRAVENOUS
  Filled 2014-06-18: qty 17

## 2014-06-18 MED ORDER — LEUCOVORIN CALCIUM INJECTION 350 MG
400.0000 mg/m2 | Freq: Once | INTRAVENOUS | Status: AC
  Administered 2014-06-18: 996 mg via INTRAVENOUS
  Filled 2014-06-18: qty 49.8

## 2014-06-18 MED ORDER — ONDANSETRON 8 MG/50ML IVPB (CHCC)
8.0000 mg | Freq: Once | INTRAVENOUS | Status: AC
  Administered 2014-06-18: 8 mg via INTRAVENOUS

## 2014-06-18 MED ORDER — DEXAMETHASONE SODIUM PHOSPHATE 10 MG/ML IJ SOLN
INTRAMUSCULAR | Status: AC
  Filled 2014-06-18: qty 1

## 2014-06-18 MED ORDER — PROCHLORPERAZINE MALEATE 10 MG PO TABS
10.0000 mg | ORAL_TABLET | Freq: Four times a day (QID) | ORAL | Status: DC | PRN
  Administered 2014-06-18: 10 mg via ORAL

## 2014-06-18 MED ORDER — SODIUM CHLORIDE 0.9 % IV SOLN
2400.0000 mg/m2 | INTRAVENOUS | Status: DC
  Administered 2014-06-18: 6000 mg via INTRAVENOUS
  Filled 2014-06-18: qty 120

## 2014-06-18 MED ORDER — PROCHLORPERAZINE MALEATE 10 MG PO TABS
ORAL_TABLET | ORAL | Status: AC
  Filled 2014-06-18: qty 1

## 2014-06-18 MED ORDER — DEXTROSE 5 % IV SOLN
Freq: Once | INTRAVENOUS | Status: AC
  Administered 2014-06-18: 12:00:00 via INTRAVENOUS

## 2014-06-18 MED ORDER — ONDANSETRON 8 MG/NS 50 ML IVPB
INTRAVENOUS | Status: AC
  Filled 2014-06-18: qty 8

## 2014-06-18 MED ORDER — DEXAMETHASONE SODIUM PHOSPHATE 10 MG/ML IJ SOLN
10.0000 mg | Freq: Once | INTRAMUSCULAR | Status: AC
  Administered 2014-06-18: 10 mg via INTRAVENOUS

## 2014-06-18 MED ORDER — FLUOROURACIL CHEMO INJECTION 2.5 GM/50ML
400.0000 mg/m2 | Freq: Once | INTRAVENOUS | Status: AC
  Administered 2014-06-18: 1000 mg via INTRAVENOUS
  Filled 2014-06-18: qty 20

## 2014-06-18 MED ORDER — DEXTROSE 5 % IV SOLN
81.0000 mg/m2 | Freq: Once | INTRAVENOUS | Status: AC
  Administered 2014-06-18: 200 mg via INTRAVENOUS
  Filled 2014-06-18: qty 40

## 2014-06-18 NOTE — Patient Instructions (Signed)
[facility] Discharge Instructions for Patients Receiving Chemotherapy  Today you received the following chemotherapy agents: Leucovorin, Oxaliplatin, 5FU   To help prevent nausea and vomiting after your treatment, we encourage you to take your nausea medication as prescribed.    If you develop nausea and vomiting that is not controlled by your nausea medication, call the clinic.   BELOW ARE SYMPTOMS THAT SHOULD BE REPORTED IMMEDIATELY:  *FEVER GREATER THAN 100.5 F  *CHILLS WITH OR WITHOUT FEVER  NAUSEA AND VOMITING THAT IS NOT CONTROLLED WITH YOUR NAUSEA MEDICATION  *UNUSUAL SHORTNESS OF BREATH  *UNUSUAL BRUISING OR BLEEDING  TENDERNESS IN MOUTH AND THROAT WITH OR WITHOUT PRESENCE OF ULCERS  *URINARY PROBLEMS  *BOWEL PROBLEMS  UNUSUAL RASH Items with * indicate a potential emergency and should be followed up as soon as possible.  Feel free to call the clinic you have any questions or concerns. The clinic phone number is [phone].

## 2014-06-18 NOTE — Progress Notes (Signed)
Nutrition follow-up completed with patient during chemotherapy for metastatic gastric cancer.  Patient reports he is trying to eat well. He denies any return of ascites or need for paracentesis. Patient does report anemia, and requests information on high iron foods. No new weight has been documented.  Nutrition diagnosis: Inadequate oral intake improved, per patient.  Intervention: Patient educated to continue small frequent meals with high-calorie, high-protein foods. Educated patient on high iron foods and ways to increase iron absorption.  Fact sheet was provided. Teach back method was used.  Monitoring, evaluation, goals: Patient will work to consume adequate calories and protein to maintain lean body mass.  Next visit: Tuesday, February 23, during chemotherapy.  **Disclaimer: This note was dictated with voice recognition software. Similar sounding words can inadvertently be transcribed and this note may contain transcription errors which may not have been corrected upon publication of note.**

## 2014-06-18 NOTE — Progress Notes (Signed)
Per Dr. Burr Medico, patient does not need neulasta with pump d/c on day 3 of this cycle; patient also to receive 2nd dose of feraheme today.

## 2014-06-19 ENCOUNTER — Encounter: Payer: Self-pay | Admitting: Hematology

## 2014-06-20 ENCOUNTER — Ambulatory Visit (HOSPITAL_BASED_OUTPATIENT_CLINIC_OR_DEPARTMENT_OTHER): Payer: BLUE CROSS/BLUE SHIELD

## 2014-06-20 ENCOUNTER — Telehealth: Payer: Self-pay | Admitting: Hematology

## 2014-06-20 ENCOUNTER — Other Ambulatory Visit: Payer: Self-pay | Admitting: *Deleted

## 2014-06-20 ENCOUNTER — Ambulatory Visit (HOSPITAL_BASED_OUTPATIENT_CLINIC_OR_DEPARTMENT_OTHER): Payer: BLUE CROSS/BLUE SHIELD | Admitting: Hematology

## 2014-06-20 ENCOUNTER — Ambulatory Visit: Payer: BLUE CROSS/BLUE SHIELD

## 2014-06-20 DIAGNOSIS — C169 Malignant neoplasm of stomach, unspecified: Secondary | ICD-10-CM

## 2014-06-20 DIAGNOSIS — D6481 Anemia due to antineoplastic chemotherapy: Secondary | ICD-10-CM

## 2014-06-20 DIAGNOSIS — R180 Malignant ascites: Secondary | ICD-10-CM

## 2014-06-20 MED ORDER — HEPARIN SOD (PORK) LOCK FLUSH 100 UNIT/ML IV SOLN
500.0000 [IU] | Freq: Once | INTRAVENOUS | Status: AC | PRN
  Administered 2014-06-20: 500 [IU]
  Filled 2014-06-20: qty 5

## 2014-06-20 MED ORDER — SODIUM CHLORIDE 0.9 % IJ SOLN
10.0000 mL | INTRAMUSCULAR | Status: DC | PRN
  Administered 2014-06-20: 10 mL
  Filled 2014-06-20: qty 10

## 2014-06-20 NOTE — Telephone Encounter (Signed)
per pof to sch pt appt per Staci/Feng ok to double book

## 2014-06-22 ENCOUNTER — Encounter: Payer: Self-pay | Admitting: Hematology

## 2014-06-22 NOTE — Progress Notes (Signed)
[place]  Telephone:([phone] Fax:([phone]) [place] Note   No care team member to display 06/22/2014  CHIEF COMPLAINTS Follow up metastatic gastric cancer    Gastric adenocarcinoma: Stage IV   04/23/2014 Imaging CT abdomen showed peritoneal carcinomatosis, ascites, thickning of gastric and GEJ wall, CT chest showed a few small b/l lung nodules.    04/24/2014 Tumor Marker CEA  5.6, CA19.9 13   04/25/2014 Initial Diagnosis Gastric adenocarcinoma: Stage IV   04/25/2014 Pathology Results Stomach mass biopsy showed - ADENOCARCINOMA WITH SIGNET RING FEATURES. ascites cytology (+)   04/25/2014 Procedure EGD: There was a large, nearly circumferential, ulcerated, clearly malignant mass that stradles the GE junction. The vast bulk of the mass lays in the stomach.    CURRENT THERAPY: [id number] started on 05/08/2015  HISTORY OF PRESENTING ILLNESS:  Samuel Durham 39 y.o. 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 I saw him first when he was recently admitted to [facility]. He is here for the first office visit after hospital discharge.  He was  admitted on 12/15 with progressive, 4 month history of dysphagia, epigastric abdominal discomfort and bloating, early satiation accompanied by nausea, and postprandial vomiting and diarrhea. He has experienced a 30 lbs weight loss in the process. He has noted increased abdominal girth. Patient denies any shortness of breath, chest pain, GI bleed, hematuria or lower extremity swelling. He denies heavy alcohol intake. Denies tobacco abuse. Denies risk factors for HIV or hepatitis. He denies any family history of GI malignancies.  CT of the abdomen and pelvis with contrast on 12/15 revealed several pulmonary nodules at the bases bilaterally, profound thickening of the distal esophagus extending beyond the gastroesophageal junction into the proximal stomach,cardia and fundus of the  stomach, most evident along the lesser curvature. No pathologic dilatation of small bowel or colon. Extensive upper abdominal and retroperitoneal lymphadenopathy was visualized. Moderate volume of ascites, presumably malignant, was noted. Extensive soft tissue nodularity throughout the omentum, compatible with omental caking and widespread peritoneal metastasis. Additionally, there are other areas of enhancing nodularity along the peritoneal surface. Tiny ventral and umbilical hernias containing predominantly omental fat, including some omental implants. There are no aggressive appearing lytic or blastic lesions noted in the visualized portions of the skeleton.  He underwent an EGD by Dr. Ardis Hughs on 04/25/2014, which showed a large fungating mass that straddles the GE junction and occupying the upper 1/3 part of the stomach. Multiple biopsy was taken which showed adenocarcinoma with signet ring features. He also underwent a paracentesis with 4.5 L fluid is removed on 04/26/2014. Cytology was positive for adenocarcinoma with signet ring features, consistent with metastatic disease.  He was discharged to home on 04/27/2014. He did feel a slightly informant of his abdominal bloating after the paracentesis, but not much improvement other symptoms. He had Mediport placement and the second paracentesis was again 4 L fluids removal on 05/02/2014. His appetite is low, he drinks ensure 3 bottles a day, some soup and a very little other foods. His energy level is also low, but able to take care of all his daily needs, and move around with about much difficulty. His abdominal discomfort/pain are relatively controlled by morphine 15 mg at night and tramadol 2-3 times during the day. He denies any fever, chills, cough, chest pain or any in the other discomfort.  INTERIM HISTORY: He returns for follow up and cycle 4 chemo. He is doing well overall and tolerating  chemotherapy without significant toxicities. He has better  appetite and eats very well now. His abdominal loading is much better improved since he started chemotherapy, he has more energy and able to do most things daily. He denies any fever, nausea or diarrhea.  MEDICAL HISTORY:  Past Medical History  Diagnosis Date  . Medical history non-contributory     SURGICAL HISTORY: Past Surgical History  Procedure Laterality Date  . Squamous cell carcinoma excision  approx March 2015    removed from top of head  . Esophagogastroduodenoscopy (egd) with propofol N/A 04/25/2014    Procedure: ESOPHAGOGASTRODUODENOSCOPY (EGD) WITH PROPOFOL;  Surgeon: Milus Banister, MD;  Location: [facility];  Service: Endoscopy;  Laterality: N/A;    SOCIAL HISTORY: History   Social History  . Marital Status: Single    Spouse Name: N/A  . Number of Children: N/A  . Years of Education: N/A   Occupational History  . Not on file.   Social History Main Topics  . Smoking status: Never Smoker   . Smokeless tobacco: Never Used  . Alcohol Use: No  . Drug Use: No  . Sexual Activity: Not on file   Other Topics Concern  . Not on file   Social History Narrative    FAMILY HISTORY: Family History  Problem Relation Age of Onset  . Breast cancer Sister 7   . Hypertension Mother   . Hypertension Father   Maternal aunt breast cancer at age of 45 Maternal cousin ovarian cancer at age of 75 Maternal ancle prostate cancer age of 13   ALLERGIES:  has No Known Allergies.  MEDICATIONS:  Current Outpatient Prescriptions  Medication Sig Dispense Refill  . docusate sodium 100 MG CAPS Take 100 mg by mouth 2 (two) times daily. 10 capsule 0  . mirtazapine (REMERON) 15 MG tablet Take 1 tablet (15 mg total) by mouth at bedtime. 30 tablet 1  . pantoprazole (PROTONIX) 40 MG tablet Take 1 tablet (40 mg total) by mouth daily. 30 tablet 0  . lidocaine-prilocaine (EMLA) cream Apply 1 application topically as needed. (Patient not taking: Reported on 06/20/2014) 30 g 0  .  morphine (MS CONTIN) 15 MG 12 hr tablet Take 1 tablet (15 mg total) by mouth every 12 (twelve) hours. (Patient not taking: Reported on 05/21/2014) 60 tablet 0  . ondansetron (ZOFRAN) 4 MG tablet Take 1 tablet (4 mg total) by mouth every 8 (eight) hours as needed for nausea or vomiting. (Patient not taking: Reported on 05/21/2014) 20 tablet 0  . prochlorperazine (COMPAZINE) 10 MG tablet Take 1 tablet (10 mg total) by mouth every 6 (six) hours as needed for nausea or vomiting. (Patient not taking: Reported on 06/20/2014) 30 tablet 3  . traMADol (ULTRAM) 50 MG tablet Take 1-2 tablets (50-100 mg total) by mouth every 6 (six) hours as needed for moderate pain. (Patient not taking: Reported on 06/20/2014) 30 tablet 0   No current facility-administered medications for this visit.    REVIEW OF SYSTEMS:   Constitutional: Denies fevers, chills or abnormal night sweats, (+) weight loss and anorexia Eyes: Denies blurriness of vision, double vision or watery eyes Ears, nose, mouth, throat, and face: Denies mucositis or sore throat Respiratory: Denies cough, dyspnea or wheezes Cardiovascular: Denies palpitation, chest discomfort or lower extremity swelling Gastrointestinal:  Less nausea, abdominal bloating and discomfort Skin: Denies abnormal skin rashes Lymphatics: Denies new lymphadenopathy or easy bruising Neurological:Denies numbness, tingling or new weaknesses Behavioral/Psych: Mood is stable, no new changes  All other  systems were reviewed with the patient and are negative except those mentioned in the history.  PHYSICAL EXAMINATION: ECOG PERFORMANCE STATUS: 1  GENERAL:alert, no distress and comfortable SKIN: skin color, texture, turgor are normal, no rashes or significant lesions EYES: normal, conjunctiva are pink and non-injected, sclera clear OROPHARYNX:no exudate, no erythema and lips, buccal mucosa, and tongue normal  NECK: supple, thyroid normal size, non-tender, without nodularity LYMPH:  no  palpable lymphadenopathy in the cervical, axillary or inguinal LUNGS: clear to auscultation and percussion with normal breathing effort HEART: regular rate & rhythm and no murmurs and no lower extremity edema ABDOMEN:abdomen soft, non-tender and normal bowel sounds, probable small amount of ascites. Musculoskeletal:no cyanosis of digits and no clubbing  PSYCH: alert & oriented x 3 with fluent speech NEURO: no focal motor/sensory deficits  LABORATORY DATA:  I have reviewed the data as listed Lab Results  Component Value Date   WBC 8.8 06/18/2014   HGB 11.6* 06/18/2014   HCT 38.0* 06/18/2014   MCV 79.5 06/18/2014   PLT 246 06/18/2014    Recent Labs  04/25/14 0434 04/26/14 0810 04/27/14 0453  05/21/14 0851 06/03/14 0952 06/18/14 0922  NA 137 135* 139  < > 142 143 142  K 4.2 3.9 4.2  < > 3.8 3.8 4.0  CL 99 98 100  --   --   --   --   CO2 _0 < > _1 GLUCOSE 88 144* 113*  < > 105 102 112  BUN _2 < > 15.7 12.8 12.6  CREATININE 0.98 0.90 0.92  < > 0.8 0.8 0.8  CALCIUM 8.7 8.8 8.7  < > 8.3* 8.3* 8.8  GFRNONAA >90 >90 >90  --   --   --   --   GFRAA >90 >90 >90  --   --   --   --   PROT 6.3  --  6.0  < > 5.9* 5.8* 6.4  ALBUMIN 2.3*  --  2.1*  < > 2.4* 2.6* 3.1*  AST 17  --  18  < > _3 ALT 10  --  9  < > _4 ALKPHOS 82  --  101  < > 111 115 111  BILITOT 0.5  --  0.5  < > 0.30 0.25 0.21  < > = values in this interval not displayed. Pathology report  Diagnosis  PERITONEAL/ASCITIC FLUID(SPECIMEN 1 OF 1 COLLECTED 04/26/14): METASTATIC ADENOCARCINOMA WITH SIGNET RING CELL FEATURES.  Stomach, biopsy, r/o cancer 04/25/14 - ADENOCARCINOMA WITH SIGNET RING FEATURES. Microscopic Comment The results were called to Dr. Ardis Hughs on 04/26/2014. (JDP:kh 04/26/14) Claudette Laws MD Pathologist, Electronic Signature (Case signed 04/26/2014)  HER-2/NEU BY CISH - NO AMPLIFICATION OF HER-2 DETECTED. RESULT RATIO OF HER2: CEP 17 SIGNALS 1.20 AVERAGE HER2 COPY  NUMBER PER CELL 2.10  RADIOGRAPHIC STUDIES: I have personally reviewed the radiological images as listed and agreed with the findings in the report. Ct Chest Wo Contrast12/24/2015    FINDINGS: Multiple bilateral pulmonary nodules/metastases, including:  --6 mm nodule in the left lower lobe (series 5/ image 21)  --8 mm nodule in the left lower lobe (series 5/ image 21)  --10 mm nodule in the right lower lobe (series 5/ image 32)  Small bilateral pleural effusions.  No pneumothorax.  Visualized thyroid is unremarkable.  The heart is normal in size.  No pericardial effusion.  11 mm short axis lower paraesophageal node (series 2/image  39). 6 mm short axis right juxta diaphragmatic node (series 2/image 36), indeterminate.  Visualized upper abdomen is notable for a mass at the GE junction, multiple large upper abdominal nodes, peritoneal carcinomatosis, upper abdominal ascites, and layering gallstones, unchanged from recent CT.  IMPRESSION: Multiple bilateral pulmonary nodules/metastases, measuring up to 10 mm in the right lower lobe.  11 mm short axis lower paraesophageal node, suspicious for nodal metastasis.  Small bilateral pleural effusions.  Gastric adenocarcinoma with upper abdominal lymphadenopathy and peritoneal carcinomatosis, incompletely visualized.   Electronically Signed   By: Julian Hy M.D.   On: 05/02/2014 08:43   Ct Abdomen Pelvis W Contrast12/15/2015    FINDINGS: Lower chest: Several pulmonary nodules are noted in the visualized lung bases bilaterally, the largest of which measures up to 1 cm in the right lower lobe (image 12 of series 3). Profound thickening of the distal esophagus extending beyond the gastroesophageal junction into the proximal stomach.  Hepatobiliary: No cystic or solid hepatic lesions. No intra or extrahepatic biliary ductal dilatation. Gallbladder is remarkable for some amorphous increased attenuation dependently, likely to reflect some biliary sludge.  Pancreas:  Fatty atrophy in the pancreas.  Otherwise, unremarkable.  Spleen: Unremarkable.  Adrenals/Urinary Tract: Bilateral adrenal glands and the right kidney are unremarkable in appearance. Exophytic 2.4 cm low-attenuation lesion in the anterior aspect of the upper pole of the left kidney is compatible with a simple cyst. No hydroureteronephrosis. Urinary bladder is normal in appearance.  Stomach/Bowel: Marked thickening of the gastric wall at the gastroesophageal junction, extending into the cardia and fundus of the stomach, most evident along the lesser curvature. This is contiguous with the previously mentioned esophageal wall thickening. No pathologic dilatation of small bowel or colon.  Vascular/Lymphatic: Extensive upper abdominal lymphadenopathy, predominantly in the gastrohepatic ligament and celiac axis nodal stations, with the largest single lymph node measuring up to 3.7 x 3.0 cm in the celiac axis nodal station (image 27 of series 2). Portacaval lymphadenopathy measuring up to 1.7 cm. Notably, many of the enlarged celiac axis lymph nodes appear partially calcified, which could suggest a mucinous subtype of neoplasm. Enlarged left paraaortic retroperitoneal lymph nodes measuring up to 2.4 x 4.2 cm adjacent to the left renal vein (image 33 of series 2). No significant atherosclerotic disease in the abdominal or pelvic vasculature.  Reproductive: Prostate gland and seminal vesicles are unremarkable in appearance.  Other: Moderate volume of ascites, presumably malignant. Extensive soft tissue nodularity throughout the omentum, compatible with omental caking and widespread peritoneal metastasis. Additionally, there are other areas of enhancing nodularity along the peritoneal surface. Tiny ventral and umbilical hernias containing predominantly omental fat, including some omental implants. No pneumoperitoneum.  Musculoskeletal: There are no aggressive appearing lytic or blastic lesions noted in the visualized portions  of the skeleton.   IMPRESSION:  1. Findings, as above, highly concerning for either esophageal or gastric primary malignancy, with metastatic lymphadenopathy in the upper abdomen and retroperitoneum, widespread intraperitoneal spread of disease, malignant ascites, and multiple pulmonary nodules in the visualize lung bases concerning for pulmonary metastasis. Oncologic evaluation is recommended.  2. Additional incidental findings, as above. These results were called by telephone at the time of interpretation on 04/23/2014 at 7:53 pm to Paul , who verbally acknowledged these results.   Electronically Signed   By: Vinnie Langton M.D.   On: 04/23/2014 19:56   US Paracentesis  04/26/2014   INDICATION: GE junction mass, lymphadenopathy, ascites. Request is made for diagnostic and therapeutic paracentesis.  EXAM: ULTRASOUND-GUIDED DIAGNOSTIC AND THERAPEUTIC PARACENTESIS  COMPARISON:  None.  MEDICATIONS: None.  COMPLICATIONS: None immediate  TECHNIQUE: Informed written consent was obtained from the patient after a discussion of the risks, benefits and alternatives to treatment. A timeout was performed prior to the initiation of the procedure.  Initial ultrasound scanning demonstrates a large amount of ascites within the right mid to lower abdominal quadrant. The right lmid to lower abdomen was prepped and draped in the usual sterile fashion. 1% lidocaine was used for local anesthesia. Under direct ultrasound guidance, a 19 gauge, 10-cm, Yueh catheter was introduced. An ultrasound image was saved for documentation purposed. The paracentesis was performed. The catheter was removed and a dressing was applied. The patient tolerated the procedure well without immediate post procedural complication.  FINDINGS: A total of approximately 4.5 liters of slightly turbid, yellow fluid was removed. Samples were sent to the laboratory as requested by the clinical team.  IMPRESSION: Successful ultrasound-guided  diagnostic and therapeutic paracentesis yielding 4.5 liters of peritoneal fluid.  Read by: Rowe Robert, PA-C   Electronically Signed   By: Corrie Mckusick D.O.   On: 04/26/2014 15:32   EGD WITH BIOPSY 04/25/2014 ENDOSCOPIC IMPRESSION: There was a large, nearly circumferential, ulcerated, clearly malignant mass that stradles the GE junction. The vast bulk of the mass lays in the stomach, occupying about 1/3 of the stomach. The mass extends into the esophagus for about 4cm above the GE junction. Mutiple biopsies were taken from the gastric portion of the mass   I ASSESSMENT & PLAN:  39 year old gentleman without significant past medical history presents with abdominal bloating, nausea, vomiting, diarrhea, anemia and weight loss. EGD showed a large mass arising from the gastric fundus and extending into GE junction, biopsy showed adenocarcinoma with signet ring features, CT scan is consistent with peritoneal carcinomatosis and bilateral lung metastasis. Ascites cytology was positive for malignant cells.  1. 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 Unfortunately, this is widely metastatic, not curable, and overall prognosis is very poor. Literature has showed gastric adenocarcinoma with signet ring features has overall worse outcome comparing to others. His life expectancy is probably less than 6 months without treatment. -He has started first-line chemotherapy FOLFOX, tolerated well, and his symptoms have much  improved. -His tumor is negative for HER-2 overexpression, no role for trastuzumab. -He had [facility] one test on his tumor, which was requested by his oncologist Dr. Rushie Nyhan at [facility], result is still pending at this point.  --He had a particular questions about this  interval of surgery. I explained to him that it's not indicated due to his diffuse metastatic disease in peritoneum. He has reached out to [facility] in [place], and requests his medical records to be sent over there. -His restaging scan is scheduled for next week. -he is interested in pursuing [facility] in [place], which is a intravenous infusion for total of 2 weeks. I'm okay with it, he will probably need a chemotherapy break during that time. He will likely do it after 3 months of chemotherapy.  2. Malignant ascites -Much improved since he started chemotherapy. -Paracentesis as needed in the future.  3. Anorexia, nausea, malnutrition -Those are all related to his cancer, his symptom has much improved since he started chemotherapy. -cont mirtazapine 15 mg at bedtime  for his anorexia. -Continue Zofran and Compazine for nausea as needed -I encouraged him to continue nutrition supplement, dietitian consult  4. Anemia, multifactorial (iron deficient anemia, cancer related and chemotherapy induced ) -His ferritin level was normal at 171, but he has low iron and saturation, likely some degree of iron deficient anemia. -I'll give him IV Feraheme 510 mg twice, 2 weeks apart -Blood transfusion as needed during chemotherapy.    Plan #1 cycle 4 FOLFOX finished today, no Neulasta with this cycle.  #2 CT chest, abdomen and pelvis with IV contrast next week #3 return to clinic in 2 weeks to discuss restaging scan and next cycle chemo if no disease progression   #4 I'll ask our medical records to fax his radical records to [facility].  Patient and his family members had many questions. All questions were answered. The patient knows to call the clinic with any problems, questions or concerns.  I spent 15 minutes counseling the patient face to face. The total time spent in the appointment was 20  minutes and more than 50% was on  counseling.     Truitt Merle, MD 06/20/14

## 2014-06-24 ENCOUNTER — Encounter (HOSPITAL_COMMUNITY): Payer: Self-pay

## 2014-06-24 ENCOUNTER — Ambulatory Visit (HOSPITAL_COMMUNITY)
Admission: RE | Admit: 2014-06-24 | Discharge: 2014-06-24 | Disposition: A | Discharge status: Home / Self Care | Payer: BLUE CROSS/BLUE SHIELD | Source: Ambulatory Visit | Attending: Hematology | Admitting: Hematology

## 2014-06-24 DIAGNOSIS — R188 Other ascites: Secondary | ICD-10-CM | POA: Insufficient documentation

## 2014-06-24 DIAGNOSIS — Z79899 Other long term (current) drug therapy: Secondary | ICD-10-CM | POA: Diagnosis not present

## 2014-06-24 DIAGNOSIS — Z08 Encounter for follow-up examination after completed treatment for malignant neoplasm: Secondary | ICD-10-CM | POA: Insufficient documentation

## 2014-06-24 DIAGNOSIS — R1012 Left upper quadrant pain: Secondary | ICD-10-CM | POA: Insufficient documentation

## 2014-06-24 DIAGNOSIS — R911 Solitary pulmonary nodule: Secondary | ICD-10-CM | POA: Diagnosis not present

## 2014-06-24 DIAGNOSIS — C169 Malignant neoplasm of stomach, unspecified: Secondary | ICD-10-CM

## 2014-06-24 DIAGNOSIS — R599 Enlarged lymph nodes, unspecified: Secondary | ICD-10-CM | POA: Insufficient documentation

## 2014-06-24 DIAGNOSIS — C786 Secondary malignant neoplasm of retroperitoneum and peritoneum: Secondary | ICD-10-CM | POA: Diagnosis not present

## 2014-06-24 MED ORDER — IOHEXOL 300 MG/ML  SOLN
100.0000 mL | Freq: Once | INTRAMUSCULAR | Status: AC | PRN
  Administered 2014-06-24: 100 mL via INTRAVENOUS

## 2014-06-25 ENCOUNTER — Other Ambulatory Visit: Payer: BLUE CROSS/BLUE SHIELD

## 2014-06-26 ENCOUNTER — Telehealth: Payer: Self-pay | Admitting: Hematology

## 2014-06-26 NOTE — Telephone Encounter (Signed)
Faxed pt medical records to [facility] [phone]

## 2014-07-02 ENCOUNTER — Encounter: Payer: Self-pay | Admitting: Hematology

## 2014-07-02 ENCOUNTER — Telehealth: Payer: Self-pay | Admitting: *Deleted

## 2014-07-02 ENCOUNTER — Other Ambulatory Visit (HOSPITAL_BASED_OUTPATIENT_CLINIC_OR_DEPARTMENT_OTHER): Payer: BLUE CROSS/BLUE SHIELD

## 2014-07-02 ENCOUNTER — Encounter: Payer: BLUE CROSS/BLUE SHIELD | Admitting: Nutrition

## 2014-07-02 ENCOUNTER — Ambulatory Visit (HOSPITAL_BASED_OUTPATIENT_CLINIC_OR_DEPARTMENT_OTHER): Payer: BLUE CROSS/BLUE SHIELD | Admitting: Hematology

## 2014-07-02 ENCOUNTER — Ambulatory Visit (HOSPITAL_BASED_OUTPATIENT_CLINIC_OR_DEPARTMENT_OTHER): Payer: BLUE CROSS/BLUE SHIELD

## 2014-07-02 ENCOUNTER — Telehealth: Payer: Self-pay | Admitting: Hematology

## 2014-07-02 VITALS — BP 140/78 | HR 58 | Temp 97.7°F | Resp 20 | Ht 69.0 in | Wt 272.5 lb

## 2014-07-02 DIAGNOSIS — E46 Unspecified protein-calorie malnutrition: Secondary | ICD-10-CM

## 2014-07-02 DIAGNOSIS — C169 Malignant neoplasm of stomach, unspecified: Secondary | ICD-10-CM

## 2014-07-02 DIAGNOSIS — J910 Malignant pleural effusion: Secondary | ICD-10-CM

## 2014-07-02 DIAGNOSIS — Z5111 Encounter for antineoplastic chemotherapy: Secondary | ICD-10-CM

## 2014-07-02 DIAGNOSIS — C786 Secondary malignant neoplasm of retroperitoneum and peritoneum: Secondary | ICD-10-CM

## 2014-07-02 DIAGNOSIS — R110 Nausea: Secondary | ICD-10-CM

## 2014-07-02 DIAGNOSIS — R630 Anorexia: Secondary | ICD-10-CM

## 2014-07-02 DIAGNOSIS — C7800 Secondary malignant neoplasm of unspecified lung: Secondary | ICD-10-CM

## 2014-07-02 LAB — COMPREHENSIVE METABOLIC PANEL (CC13)
ALK PHOS: 101 U/L (ref 40–150)
ALT: 9 U/L (ref 0–55)
ANION GAP: 11 meq/L (ref 3–11)
AST: 15 U/L (ref 5–34)
Albumin: 2.9 g/dL — ABNORMAL LOW (ref 3.5–5.0)
BILIRUBIN TOTAL: 0.31 mg/dL (ref 0.20–1.20)
BUN: 12 mg/dL (ref 7.0–26.0)
CO2: 25 mEq/L (ref 22–29)
CREATININE: 0.7 mg/dL (ref 0.7–1.3)
Calcium: 9.3 mg/dL (ref 8.4–10.4)
Chloride: 109 mEq/L (ref 98–109)
Glucose: 110 mg/dl (ref 70–140)
Potassium: 4.2 mEq/L (ref 3.5–5.1)
SODIUM: 145 meq/L (ref 136–145)
TOTAL PROTEIN: 6 g/dL — AB (ref 6.4–8.3)

## 2014-07-02 LAB — CBC WITH DIFFERENTIAL/PLATELET
BASO%: 0.7 % (ref 0.0–2.0)
BASOS ABS: 0 10*3/uL (ref 0.0–0.1)
EOS%: 2 % (ref 0.0–7.0)
Eosinophils Absolute: 0.1 10*3/uL (ref 0.0–0.5)
HCT: 36.1 % — ABNORMAL LOW (ref 38.4–49.9)
HEMOGLOBIN: 11.4 g/dL — AB (ref 13.0–17.1)
LYMPH%: 39.3 % (ref 14.0–49.0)
MCH: 25.6 pg — ABNORMAL LOW (ref 27.2–33.4)
MCHC: 31.6 g/dL — ABNORMAL LOW (ref 32.0–36.0)
MCV: 80.9 fL (ref 79.3–98.0)
MONO#: 0.8 10*3/uL (ref 0.1–0.9)
MONO%: 17 % — AB (ref 0.0–14.0)
NEUT#: 1.9 10*3/uL (ref 1.5–6.5)
NEUT%: 41 % (ref 39.0–75.0)
PLATELETS: 182 10*3/uL (ref 140–400)
RBC: 4.46 10*6/uL (ref 4.20–5.82)
RDW: 23 % — ABNORMAL HIGH (ref 11.0–14.6)
WBC: 4.5 10*3/uL (ref 4.0–10.3)
lymph#: 1.8 10*3/uL (ref 0.9–3.3)

## 2014-07-02 MED ORDER — DEXAMETHASONE SODIUM PHOSPHATE 10 MG/ML IJ SOLN
INTRAMUSCULAR | Status: AC
Start: 2014-07-02 — End: 2014-07-02
  Filled 2014-07-02: qty 1

## 2014-07-02 MED ORDER — ONDANSETRON 8 MG/50ML IVPB (CHCC)
8.0000 mg | Freq: Once | INTRAVENOUS | Status: AC
  Administered 2014-07-02: 8 mg via INTRAVENOUS

## 2014-07-02 MED ORDER — LEUCOVORIN CALCIUM INJECTION 350 MG
400.0000 mg/m2 | Freq: Once | INTRAMUSCULAR | Status: AC
  Administered 2014-07-02: 996 mg via INTRAVENOUS
  Filled 2014-07-02: qty 49.8

## 2014-07-02 MED ORDER — DEXTROSE 5 % IV SOLN
Freq: Once | INTRAVENOUS | Status: AC
  Administered 2014-07-02: 12:00:00 via INTRAVENOUS

## 2014-07-02 MED ORDER — DEXAMETHASONE SODIUM PHOSPHATE 10 MG/ML IJ SOLN
10.0000 mg | Freq: Once | INTRAMUSCULAR | Status: AC
  Administered 2014-07-02: 10 mg via INTRAVENOUS

## 2014-07-02 MED ORDER — SODIUM CHLORIDE 0.9 % IV SOLN
2400.0000 mg/m2 | INTRAVENOUS | Status: DC
  Administered 2014-07-02: 6000 mg via INTRAVENOUS
  Filled 2014-07-02: qty 120

## 2014-07-02 MED ORDER — PROCHLORPERAZINE MALEATE 10 MG PO TABS
ORAL_TABLET | ORAL | Status: AC
  Filled 2014-07-02: qty 1

## 2014-07-02 MED ORDER — FLUOROURACIL CHEMO INJECTION 2.5 GM/50ML
400.0000 mg/m2 | Freq: Once | INTRAVENOUS | Status: AC
  Administered 2014-07-02: 1000 mg via INTRAVENOUS
  Filled 2014-07-02: qty 20

## 2014-07-02 MED ORDER — PROCHLORPERAZINE MALEATE 10 MG PO TABS
10.0000 mg | ORAL_TABLET | Freq: Four times a day (QID) | ORAL | Status: DC | PRN
  Administered 2014-07-02: 10 mg via ORAL

## 2014-07-02 MED ORDER — ONDANSETRON 8 MG/NS 50 ML IVPB
INTRAVENOUS | Status: AC
  Filled 2014-07-02: qty 8

## 2014-07-02 MED ORDER — DEXTROSE 5 % IV SOLN
81.0000 mg/m2 | Freq: Once | INTRAVENOUS | Status: AC
  Administered 2014-07-02: 200 mg via INTRAVENOUS
  Filled 2014-07-02: qty 40

## 2014-07-02 NOTE — Telephone Encounter (Signed)
Pt confirmed labs/ov per 02/23 POF, gave pt AVS... KJ, sent msg to add chemo and sent msg to MD to give time for 03/08 for MD visit in chemo room.Marland KitchenMarland KitchenMarland Kitchen

## 2014-07-02 NOTE — Patient Instructions (Signed)
[facility] Discharge Instructions for Patients Receiving Chemotherapy  Today you received the following chemotherapy agents:  Oxaliplatin, Leucovorin and 5FU  To help prevent nausea and vomiting after your treatment, we encourage you to take your nausea medication as ordered per MD.   If you develop nausea and vomiting that is not controlled by your nausea medication, call the clinic.   BELOW ARE SYMPTOMS THAT SHOULD BE REPORTED IMMEDIATELY:  *FEVER GREATER THAN 100.5 F  *CHILLS WITH OR WITHOUT FEVER  NAUSEA AND VOMITING THAT IS NOT CONTROLLED WITH YOUR NAUSEA MEDICATION  *UNUSUAL SHORTNESS OF BREATH  *UNUSUAL BRUISING OR BLEEDING  TENDERNESS IN MOUTH AND THROAT WITH OR WITHOUT PRESENCE OF ULCERS  *URINARY PROBLEMS  *BOWEL PROBLEMS  UNUSUAL RASH Items with * indicate a potential emergency and should be followed up as soon as possible.  Feel free to call the clinic you have any questions or concerns. The clinic phone number is [phone].

## 2014-07-02 NOTE — Progress Notes (Signed)
[place]  Telephone:([phone]) [phone] Fax:([phone]) [place] Note   No care team member to display 07/02/2014  CHIEF COMPLAINTS Follow up metastatic gastric cancer    Gastric adenocarcinoma: Stage IV   04/23/2014 Imaging CT abdomen showed peritoneal carcinomatosis, ascites, thickning of gastric and GEJ wall, CT chest showed a few small b/l lung nodules.    04/24/2014 Tumor Marker CEA  5.6, CA19.9 13   04/25/2014 Initial Diagnosis Gastric adenocarcinoma: Stage IV   04/25/2014 Pathology Results Stomach mass biopsy showed - ADENOCARCINOMA WITH SIGNET RING FEATURES. ascites cytology (+)   04/25/2014 Procedure EGD: There was a large, nearly circumferential, ulcerated, clearly malignant mass that stradles the GE junction. The vast bulk of the mass lays in the stomach.    CURRENT THERAPY: [id number] started on 05/08/2015  HISTORY OF PRESENTING ILLNESS:  Samuel Durham 39 y.o. 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 I saw him first when he was recently admitted to [facility]. He is here for the first office visit after hospital discharge.  He was  admitted on 12/15 with progressive, 4 month history of dysphagia, epigastric abdominal discomfort and bloating, early satiation accompanied by nausea, and postprandial vomiting and diarrhea. He has experienced a 30 lbs weight loss in the process. He has noted increased abdominal girth. Patient denies any shortness of breath, chest pain, GI bleed, hematuria or lower extremity swelling. He denies heavy alcohol intake. Denies tobacco abuse. Denies risk factors for HIV or hepatitis. He denies any family history of GI malignancies.  CT of the abdomen and pelvis with contrast on 12/15 revealed several pulmonary nodules at the bases bilaterally, profound thickening of the distal esophagus extending beyond the gastroesophageal junction into the proximal stomach,cardia and fundus of the  stomach, most evident along the lesser curvature. No pathologic dilatation of small bowel or colon. Extensive upper abdominal and retroperitoneal lymphadenopathy was visualized. Moderate volume of ascites, presumably malignant, was noted. Extensive soft tissue nodularity throughout the omentum, compatible with omental caking and widespread peritoneal metastasis. Additionally, there are other areas of enhancing nodularity along the peritoneal surface. Tiny ventral and umbilical hernias containing predominantly omental fat, including some omental implants. There are no aggressive appearing lytic or blastic lesions noted in the visualized portions of the skeleton.  He underwent an EGD by Dr. Ardis Hughs on 04/25/2014, which showed a large fungating mass that straddles the GE junction and occupying the upper 1/3 part of the stomach. Multiple biopsy was taken which showed adenocarcinoma with signet ring features. He also underwent a paracentesis with 4.5 L fluid is removed on 04/26/2014. Cytology was positive for adenocarcinoma with signet ring features, consistent with metastatic disease.  He was discharged to home on 04/27/2014. He did feel a slightly informant of his abdominal bloating after the paracentesis, but not much improvement other symptoms. He had Mediport placement and the second paracentesis was again 4 L fluids removal on 05/02/2014. His appetite is low, he drinks ensure 3 bottles a day, some soup and a very little other foods. His energy level is also low, but able to take care of all his daily needs, and move around with about much difficulty. His abdominal discomfort/pain are relatively controlled by morphine 15 mg at night and tramadol 2-3 times during the day. He denies any fever, chills, cough, chest pain or any in the other discomfort.  INTERIM HISTORY: He returns for follow up and discuss the restaging scan. He has been tolerating the chemo  well. He feels well overall. He has mild fatigue, but  functions well. He has good energy and appetite lately. No significant side effects from chemo. No fever or chills.    MEDICAL HISTORY:  Past Medical History  Diagnosis Date  . Medical history non-contributory     SURGICAL HISTORY: Past Surgical History  Procedure Laterality Date  . Squamous cell carcinoma excision  approx March 2015    removed from top of head  . Esophagogastroduodenoscopy (egd) with propofol N/A 04/25/2014    Procedure: ESOPHAGOGASTRODUODENOSCOPY (EGD) WITH PROPOFOL;  Surgeon: Milus Banister, MD;  Location: [facility] ENDOSCOPY;  Service: Endoscopy;  Laterality: N/A;    SOCIAL HISTORY: History   Social History  . Marital Status: Single    Spouse Name: N/A  . Number of Children: N/A  . Years of Education: N/A   Occupational History  . Not on file.   Social History Main Topics  . Smoking status: Never Smoker   . Smokeless tobacco: Never Used  . Alcohol Use: No  . Drug Use: No  . Sexual Activity: Not on file   Other Topics Concern  . Not on file   Social History Narrative    FAMILY HISTORY: Family History  Problem Relation Age of Onset  . Breast cancer Sister 68   . Hypertension Mother   . Hypertension Father   Maternal aunt breast cancer at age of 36 Maternal cousin ovarian cancer at age of 41 Maternal ancle prostate cancer age of 23   ALLERGIES:  has No Known Allergies.  MEDICATIONS:  Current Outpatient Prescriptions  Medication Sig Dispense Refill  . docusate sodium 100 MG CAPS Take 100 mg by mouth 2 (two) times daily. 10 capsule 0  . lidocaine-prilocaine (EMLA) cream Apply 1 application topically as needed. 30 g 0  . traMADol (ULTRAM) 50 MG tablet Take 1-2 tablets (50-100 mg total) by mouth every 6 (six) hours as needed for moderate pain. 30 tablet 0  . mirtazapine (REMERON) 15 MG tablet Take 1 tablet (15 mg total) by mouth at bedtime. (Patient not taking: Reported on 07/02/2014) 30 tablet 1  . morphine (MS CONTIN) 15 MG 12 hr tablet Take 1  tablet (15 mg total) by mouth every 12 (twelve) hours. (Patient not taking: Reported on 05/21/2014) 60 tablet 0  . ondansetron (ZOFRAN) 4 MG tablet Take 1 tablet (4 mg total) by mouth every 8 (eight) hours as needed for nausea or vomiting. (Patient not taking: Reported on 05/21/2014) 20 tablet 0  . pantoprazole (PROTONIX) 40 MG tablet Take 1 tablet (40 mg total) by mouth daily. (Patient not taking: Reported on 07/02/2014) 30 tablet 0  . prochlorperazine (COMPAZINE) 10 MG tablet Take 1 tablet (10 mg total) by mouth every 6 (six) hours as needed for nausea or vomiting. (Patient not taking: Reported on 06/20/2014) 30 tablet 3   No current facility-administered medications for this visit.    REVIEW OF SYSTEMS:   Constitutional: Denies fevers, chills or abnormal night sweats, (+) weight loss and anorexia Eyes: Denies blurriness of vision, double vision or watery eyes Ears, nose, mouth, throat, and face: Denies mucositis or sore throat Respiratory: Denies cough, dyspnea or wheezes Cardiovascular: Denies palpitation, chest discomfort or lower extremity swelling Gastrointestinal:  Less nausea, abdominal bloating and discomfort Skin: Denies abnormal skin rashes Lymphatics: Denies new lymphadenopathy or easy bruising Neurological:Denies numbness, tingling or new weaknesses Behavioral/Psych: Mood is stable, no new changes  All other systems were reviewed with the patient and are negative except those  mentioned in the history.  PHYSICAL EXAMINATION: ECOG PERFORMANCE STATUS: 1  GENERAL:alert, no distress and comfortable SKIN: skin color, texture, turgor are normal, no rashes or significant lesions EYES: normal, conjunctiva are pink and non-injected, sclera clear OROPHARYNX:no exudate, no erythema and lips, buccal mucosa, and tongue normal  NECK: supple, thyroid normal size, non-tender, without nodularity LYMPH:  no palpable lymphadenopathy in the cervical, axillary or inguinal LUNGS: clear to  auscultation and percussion with normal breathing effort HEART: regular rate & rhythm and no murmurs and no lower extremity edema ABDOMEN:abdomen soft, non-tender and normal bowel sounds, probable small amount of ascites. Musculoskeletal:no cyanosis of digits and no clubbing  PSYCH: alert & oriented x 3 with fluent speech NEURO: no focal motor/sensory deficits  LABORATORY DATA:  I have reviewed the data as listed Lab Results  Component Value Date   WBC 4.5 07/02/2014   HGB 11.4* 07/02/2014   HCT 36.1* 07/02/2014   MCV 80.9 07/02/2014   PLT 182 07/02/2014    Recent Labs  04/25/14 0434 04/26/14 0810 04/27/14 0453  06/03/14 0952 06/18/14 0922 07/02/14 0959  NA 137 135* 139  < > 143 142 145  K 4.2 3.9 4.2  < > 3.8 4.0 4.2  CL 99 98 100  --   --   --   --   CO2 26 25 26   < > 28 24 25   GLUCOSE 88 144* 113*  < > 102 112 110  BUN 15 12 13   < > 12.8 12.6 12.0  CREATININE 0.98 0.90 0.92  < > 0.8 0.8 0.7  CALCIUM 8.7 8.8 8.7  < > 8.3* 8.8 9.3  GFRNONAA >90 >90 >90  --   --   --   --   GFRAA >90 >90 >90  --   --   --   --   PROT 6.3  --  6.0  < > 5.8* 6.4 6.0*  ALBUMIN 2.3*  --  2.1*  < > 2.6* 3.1* 2.9*  AST 17  --  18  < > 14 18 15   ALT 10  --  9  < > 12 13 9   ALKPHOS 82  --  101  < > 115 111 101  BILITOT 0.5  --  0.5  < > 0.25 0.21 0.31  < > = values in this interval not displayed. Pathology report  Diagnosis  PERITONEAL/ASCITIC FLUID(SPECIMEN 1 OF 1 COLLECTED 04/26/14): METASTATIC ADENOCARCINOMA WITH SIGNET RING CELL FEATURES.  Stomach, biopsy, r/o cancer 04/25/14 - ADENOCARCINOMA WITH SIGNET RING FEATURES. Microscopic Comment The results were called to Dr. Ardis Hughs on 04/26/2014. (JDP:kh 04/26/14) Claudette Laws MD Pathologist, Electronic Signature (Case signed 04/26/2014)  HER-2/NEU BY CISH - NO AMPLIFICATION OF HER-2 DETECTED. RESULT RATIO OF HER2: CEP 17 SIGNALS 1.20 AVERAGE HER2 COPY NUMBER PER CELL 2.10  RADIOGRAPHIC STUDIES: I have personally reviewed the  radiological images as listed and agreed with the findings in the report.  Ct Chest, abdomen and pelvis Wo Contrast12/24/2015    IMPRESSION:  1. Findings, as above, highly concerning for either esophageal or gastric primary malignancy, with metastatic lymphadenopathy in the upper abdomen and retroperitoneum, widespread intraperitoneal spread of disease, malignant ascites, and multiple pulmonary nodules in the visualize lung bases concerning for pulmonary metastasis. Oncologic evaluation is recommended.  2. Additional incidental findings, as above. These results were called by telephone at the time of interpretation on 04/23/2014 at 7:53 pm to [place] , who verbally acknowledged these results.   Electronically Signed  By: Vinnie Langton M.D.   On: 04/23/2014 19:56   CT CAP 06/24/2014 IMPRESSION: 1. Progressive low density and calcifications within the extensive upper abdominal lymphadenopathy, likely related to partial treatment. Some of the lymph nodes do appear slightly larger. The irregular infiltrating mass involving the gastric cardia appears slightly improved. 2. No evidence of hepatic metastatic disease. 3. Overall volume of ascites has improved, although extensive changes of peritoneal carcinomatosis have not significantly changed. 4. No significant change in bilateral pulmonary nodules consistent with metastatic disease. 5. Resolution of bilateral pleural effusions.   EGD WITH BIOPSY 04/25/2014 ENDOSCOPIC IMPRESSION: There was a large, nearly circumferential, ulcerated, clearly malignant mass that stradles the GE junction. The vast bulk of the mass lays in the stomach, occupying about 1/3 of the stomach. The mass extends into the esophagus for about 4cm above the GE junction. Mutiple biopsies were taken from the gastric portion of the mass   I ASSESSMENT & PLAN:  39 year old gentleman without significant past medical history presents with abdominal bloating, nausea,  vomiting, diarrhea, anemia and weight loss. EGD showed a large mass arising from the gastric fundus and extending into GE junction, biopsy showed adenocarcinoma with signet ring features, CT scan is consistent with peritoneal carcinomatosis and bilateral lung metastasis. Ascites cytology was positive for malignant cells.  1. 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 Unfortunately, this is widely metastatic, not curable, and overall prognosis is very poor. Literature has showed gastric adenocarcinoma with signet ring features has overall worse outcome comparing to others. His life expectancy is probably less than 6 months without treatment. -He has started first-line chemotherapy FOLFOX, tolerated well, and his symptoms have much  improved. -His tumor is negative for HER-2 overexpression, no role for trastuzumab. -He had [facility] one test on his tumor, which was requested by his oncologist Dr. Rushie Nyhan at [facility], he will please reported to me this week. -I discussed his restaging CT scan results with patient and his parents. Scan showed mild partial response, overall stable disease. His ascites has significantly decreased. Pleural effusion resolved. He is clinically doing well. I recommend to continue the current FOLFOX chemotherapy. -he is interested in pursuing [facility] in [place], which is a intravenous infusion for total of 2 weeks. I'm okay with it, he will probably need a chemotherapy break during that time. He will likely do it after 3-4 months of chemotherapy. -Clinical trial has showed excellent response (about 70%) in non-colorectal cancers with MSI high. I will request his biopsy to be tested for MMR and MSI. If his tumor is MSI high, I would strongly favor immunotherapy,  probably in a clinical trial setting or off label use.  2. Malignant ascites -Much improved since he started chemotherapy. -Paracentesis as needed in the future.  3. Anorexia, nausea, malnutrition -Those are all related to his cancer, his symptom has much improved since he started chemotherapy. -cont mirtazapine 15 mg at bedtime for his anorexia. -Continue Zofran and Compazine for nausea as needed -I encouraged him to continue nutrition supplement, dietitian consult  4. Anemia, multifactorial (iron deficient anemia, cancer related and chemotherapy induced ) -His ferritin level was normal at 171, but he has low iron and saturation, likely some degree of iron deficient anemia. -He has received IV Feraheme 510 mg twice, 2 weeks apart -Blood transfusion as needed during  chemotherapy.    Plan #1 cycle 5 FOLFOX today, no Neulasta with this cycle.  #2 RTC in 2 weeks   Patient and his family members had many questions. All questions were answered. The patient knows to call the clinic with any problems, questions or concerns.  I spent 30 minutes counseling the patient face to face. The total time spent in the appointment was 40  minutes and more than 50% was on counseling.     Truitt Merle, MD 07/02/2014

## 2014-07-02 NOTE — Telephone Encounter (Signed)
Per staff message and POF I have scheduled appts. Advised scheduler of appts. JMW  

## 2014-07-04 ENCOUNTER — Ambulatory Visit (HOSPITAL_BASED_OUTPATIENT_CLINIC_OR_DEPARTMENT_OTHER): Payer: BLUE CROSS/BLUE SHIELD

## 2014-07-04 DIAGNOSIS — C169 Malignant neoplasm of stomach, unspecified: Secondary | ICD-10-CM

## 2014-07-04 DIAGNOSIS — C786 Secondary malignant neoplasm of retroperitoneum and peritoneum: Secondary | ICD-10-CM

## 2014-07-04 DIAGNOSIS — C7800 Secondary malignant neoplasm of unspecified lung: Secondary | ICD-10-CM

## 2014-07-04 MED ORDER — SODIUM CHLORIDE 0.9 % IJ SOLN
10.0000 mL | INTRAMUSCULAR | Status: DC | PRN
  Administered 2014-07-04: 10 mL
  Filled 2014-07-04: qty 10

## 2014-07-04 MED ORDER — HEPARIN SOD (PORK) LOCK FLUSH 100 UNIT/ML IV SOLN
500.0000 [IU] | Freq: Once | INTRAVENOUS | Status: AC | PRN
  Administered 2014-07-04: 500 [IU]
  Filled 2014-07-04: qty 5

## 2014-07-09 ENCOUNTER — Other Ambulatory Visit: Payer: BLUE CROSS/BLUE SHIELD

## 2014-07-16 ENCOUNTER — Other Ambulatory Visit (HOSPITAL_BASED_OUTPATIENT_CLINIC_OR_DEPARTMENT_OTHER): Payer: BLUE CROSS/BLUE SHIELD

## 2014-07-16 ENCOUNTER — Encounter: Payer: Self-pay | Admitting: Hematology

## 2014-07-16 ENCOUNTER — Ambulatory Visit (HOSPITAL_BASED_OUTPATIENT_CLINIC_OR_DEPARTMENT_OTHER): Payer: BLUE CROSS/BLUE SHIELD | Admitting: Hematology

## 2014-07-16 ENCOUNTER — Ambulatory Visit (HOSPITAL_BASED_OUTPATIENT_CLINIC_OR_DEPARTMENT_OTHER): Payer: BLUE CROSS/BLUE SHIELD

## 2014-07-16 ENCOUNTER — Encounter: Payer: BLUE CROSS/BLUE SHIELD | Admitting: Nutrition

## 2014-07-16 DIAGNOSIS — R630 Anorexia: Secondary | ICD-10-CM

## 2014-07-16 DIAGNOSIS — C169 Malignant neoplasm of stomach, unspecified: Secondary | ICD-10-CM

## 2014-07-16 DIAGNOSIS — C7800 Secondary malignant neoplasm of unspecified lung: Secondary | ICD-10-CM

## 2014-07-16 DIAGNOSIS — R110 Nausea: Secondary | ICD-10-CM

## 2014-07-16 DIAGNOSIS — D6481 Anemia due to antineoplastic chemotherapy: Secondary | ICD-10-CM

## 2014-07-16 DIAGNOSIS — C786 Secondary malignant neoplasm of retroperitoneum and peritoneum: Secondary | ICD-10-CM

## 2014-07-16 DIAGNOSIS — Z5111 Encounter for antineoplastic chemotherapy: Secondary | ICD-10-CM

## 2014-07-16 DIAGNOSIS — J910 Malignant pleural effusion: Secondary | ICD-10-CM

## 2014-07-16 DIAGNOSIS — E46 Unspecified protein-calorie malnutrition: Secondary | ICD-10-CM

## 2014-07-16 LAB — CBC WITH DIFFERENTIAL/PLATELET
BASO%: 0.5 % (ref 0.0–2.0)
Basophils Absolute: 0 10*3/uL (ref 0.0–0.1)
EOS%: 1.9 % (ref 0.0–7.0)
Eosinophils Absolute: 0.1 10*3/uL (ref 0.0–0.5)
HEMATOCRIT: 37.4 % — AB (ref 38.4–49.9)
HGB: 11.9 g/dL — ABNORMAL LOW (ref 13.0–17.1)
LYMPH%: 41.7 % (ref 14.0–49.0)
MCH: 26.1 pg — AB (ref 27.2–33.4)
MCHC: 31.8 g/dL — ABNORMAL LOW (ref 32.0–36.0)
MCV: 82 fL (ref 79.3–98.0)
MONO#: 0.8 10*3/uL (ref 0.1–0.9)
MONO%: 21.3 % — AB (ref 0.0–14.0)
NEUT#: 1.3 10*3/uL — ABNORMAL LOW (ref 1.5–6.5)
NEUT%: 34.6 % — AB (ref 39.0–75.0)
RBC: 4.56 10*6/uL (ref 4.20–5.82)
RDW: 21.7 % — ABNORMAL HIGH (ref 11.0–14.6)
WBC: 3.7 10*3/uL — AB (ref 4.0–10.3)
lymph#: 1.5 10*3/uL (ref 0.9–3.3)
nRBC: 0 % (ref 0–0)

## 2014-07-16 LAB — COMPREHENSIVE METABOLIC PANEL (CC13)
ALK PHOS: 108 U/L (ref 40–150)
ALT: 11 U/L (ref 0–55)
AST: 17 U/L (ref 5–34)
Albumin: 2.9 g/dL — ABNORMAL LOW (ref 3.5–5.0)
Anion Gap: 9 mEq/L (ref 3–11)
BILIRUBIN TOTAL: 0.38 mg/dL (ref 0.20–1.20)
BUN: 15.4 mg/dL (ref 7.0–26.0)
CO2: 25 mEq/L (ref 22–29)
Calcium: 9 mg/dL (ref 8.4–10.4)
Chloride: 107 mEq/L (ref 98–109)
Creatinine: 0.8 mg/dL (ref 0.7–1.3)
EGFR: >90 mL/min/{1.73_m2} (ref >90)
Glucose: 112 mg/dl (ref 70–140)
Potassium: 4 mEq/L (ref 3.5–5.1)
SODIUM: 141 meq/L (ref 136–145)
Total Protein: 6.2 g/dL — ABNORMAL LOW (ref 6.4–8.3)

## 2014-07-16 LAB — TECHNOLOGIST REVIEW

## 2014-07-16 MED ORDER — PROCHLORPERAZINE MALEATE 10 MG PO TABS
10.0000 mg | ORAL_TABLET | Freq: Four times a day (QID) | ORAL | Status: DC | PRN
  Administered 2014-07-16: 10 mg via ORAL

## 2014-07-16 MED ORDER — PROCHLORPERAZINE MALEATE 10 MG PO TABS
ORAL_TABLET | ORAL | Status: AC
  Filled 2014-07-16: qty 1

## 2014-07-16 MED ORDER — LEUCOVORIN CALCIUM INJECTION 350 MG
400.0000 mg/m2 | Freq: Once | INTRAVENOUS | Status: AC
  Administered 2014-07-16: 996 mg via INTRAVENOUS
  Filled 2014-07-16: qty 49.8

## 2014-07-16 MED ORDER — FLUOROURACIL CHEMO INJECTION 5 GM/100ML
2400.0000 mg/m2 | INTRAVENOUS | Status: DC
  Administered 2014-07-16: 6000 mg via INTRAVENOUS
  Filled 2014-07-16: qty 120

## 2014-07-16 MED ORDER — DEXTROSE 5 % IV SOLN
Freq: Once | INTRAVENOUS | Status: AC
  Administered 2014-07-16: 11:00:00 via INTRAVENOUS

## 2014-07-16 MED ORDER — DEXAMETHASONE SODIUM PHOSPHATE 100 MG/10ML IJ SOLN
Freq: Once | INTRAMUSCULAR | Status: AC
  Administered 2014-07-16: 12:00:00 via INTRAVENOUS
  Filled 2014-07-16: qty 4

## 2014-07-16 MED ORDER — OXALIPLATIN CHEMO INJECTION 100 MG/20ML
80.0000 mg/m2 | Freq: Once | INTRAVENOUS | Status: AC
  Administered 2014-07-16: 200 mg via INTRAVENOUS
  Filled 2014-07-16: qty 40

## 2014-07-16 NOTE — Progress Notes (Signed)
[place]  [phone] Fax:([phone]) [place] Note   No care team member to display 07/16/2014  CHIEF COMPLAINTS Follow up metastatic gastric cancer    Gastric adenocarcinoma: Stage IV   04/23/2014 Imaging CT abdomen showed peritoneal carcinomatosis, ascites, thickning of gastric and GEJ wall, CT chest showed a few small b/l lung nodules.    04/24/2014 Tumor Marker CEA  5.6, CA19.9 13   04/25/2014 Initial Diagnosis Gastric adenocarcinoma: Stage IV   04/25/2014 Pathology Results Stomach mass biopsy showed - ADENOCARCINOMA WITH SIGNET RING FEATURES. ascites cytology (+)   04/25/2014 Procedure EGD: There was a large, nearly circumferential, ulcerated, clearly malignant mass that stradles the GE junction. The vast bulk of the mass lays in the stomach.    CURRENT THERAPY: [id number] started on 05/08/2015  HISTORY OF PRESENTING ILLNESS:  Samuel Durham 39 y.o. 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 I saw him first when he was recently admitted to [facility]. He is here for the first office visit after hospital discharge.  He was  admitted on 12/15 with progressive, 4 month history of dysphagia, epigastric abdominal discomfort and bloating, early satiation accompanied by nausea, and postprandial vomiting and diarrhea. He has experienced a 30 lbs weight loss in the process. He has noted increased abdominal girth. Patient denies any shortness of breath, chest pain, GI bleed, hematuria or lower extremity swelling. He denies heavy alcohol intake. Denies tobacco abuse. Denies risk factors for HIV or hepatitis. He denies any family history of GI malignancies.  CT of the abdomen and pelvis with contrast on 12/15 revealed several pulmonary nodules at the bases bilaterally, profound thickening of the distal esophagus extending beyond the gastroesophageal junction into the proximal stomach,cardia and fundus of the  stomach, most evident along the lesser curvature. No pathologic dilatation of small bowel or colon. Extensive upper abdominal and retroperitoneal lymphadenopathy was visualized. Moderate volume of ascites, presumably malignant, was noted. Extensive soft tissue nodularity throughout the omentum, compatible with omental caking and widespread peritoneal metastasis. Additionally, there are other areas of enhancing nodularity along the peritoneal surface. Tiny ventral and umbilical hernias containing predominantly omental fat, including some omental implants. There are no aggressive appearing lytic or blastic lesions noted in the visualized portions of the skeleton.  He underwent an EGD by Dr. Ardis Hughs on 04/25/2014, which showed a large fungating mass that straddles the GE junction and occupying the upper 1/3 part of the stomach. Multiple biopsy was taken which showed adenocarcinoma with signet ring features. He also underwent a paracentesis with 4.5 L fluid is removed on 04/26/2014. Cytology was positive for adenocarcinoma with signet ring features, consistent with metastatic disease.  He was discharged to home on 04/27/2014. He did feel a slightly informant of his abdominal bloating after the paracentesis, but not much improvement other symptoms. He had Mediport placement and the second paracentesis was again 4 L fluids removal on 05/02/2014. His appetite is low, he drinks ensure 3 bottles a day, some soup and a very little other foods. His energy level is also low, but able to take care of all his daily needs, and move around with about much difficulty. His abdominal discomfort/pain are relatively controlled by morphine 15 mg at night and tramadol 2-3 times during the day. He denies any fever, chills, cough, chest pain or any in the other discomfort.  INTERIM HISTORY: He returns for follow up and cycle 6 chemotherapy. He has been doing well overall and  tolerating the chemo well. He had a 1 episode of  epigastric pain last week, which resolved on its own next morning. He denies any nausea, neuropathy, no signs of bleeding or other symptoms. He has good appetite and energy level. He is doing well at home.   MEDICAL HISTORY:  Past Medical History  Diagnosis Date  . Medical history non-contributory     SURGICAL HISTORY: Past Surgical History  Procedure Laterality Date  . Squamous cell carcinoma excision  approx March 2015    removed from top of head  . Esophagogastroduodenoscopy (egd) with propofol N/A 04/25/2014    Procedure: ESOPHAGOGASTRODUODENOSCOPY (EGD) WITH PROPOFOL;  Surgeon: Milus Banister, MD;  Location: [facility] ENDOSCOPY;  Service: Endoscopy;  Laterality: N/A;    SOCIAL HISTORY: History   Social History  . Marital Status: Single    Spouse Name: N/A  . Number of Children: N/A  . Years of Education: N/A   Occupational History  . Not on file.   Social History Main Topics  . Smoking status: Never Smoker   . Smokeless tobacco: Never Used  . Alcohol Use: No  . Drug Use: No  . Sexual Activity: Not on file   Other Topics Concern  . Not on file   Social History Narrative    FAMILY HISTORY: Family History  Problem Relation Age of Onset  . Breast cancer Sister 19   . Hypertension Mother   . Hypertension Father   Maternal aunt breast cancer at age of 53 Maternal cousin ovarian cancer at age of 75 Maternal ancle prostate cancer age of 48   ALLERGIES:  has No Known Allergies.  MEDICATIONS:  Current Outpatient Prescriptions  Medication Sig Dispense Refill  . docusate sodium 100 MG CAPS Take 100 mg by mouth 2 (two) times daily. 10 capsule 0  . lidocaine-prilocaine (EMLA) cream Apply 1 application topically as needed. 30 g 0  . mirtazapine (REMERON) 15 MG tablet Take 1 tablet (15 mg total) by mouth at bedtime. (Patient not taking: Reported on 07/02/2014) 30 tablet 1  . morphine (MS CONTIN) 15 MG 12 hr tablet Take 1 tablet (15 mg total) by mouth every 12 (twelve) hours.  (Patient not taking: Reported on 05/21/2014) 60 tablet 0  . ondansetron (ZOFRAN) 4 MG tablet Take 1 tablet (4 mg total) by mouth every 8 (eight) hours as needed for nausea or vomiting. (Patient not taking: Reported on 05/21/2014) 20 tablet 0  . pantoprazole (PROTONIX) 40 MG tablet Take 1 tablet (40 mg total) by mouth daily. (Patient not taking: Reported on 07/02/2014) 30 tablet 0  . prochlorperazine (COMPAZINE) 10 MG tablet Take 1 tablet (10 mg total) by mouth every 6 (six) hours as needed for nausea or vomiting. (Patient not taking: Reported on 06/20/2014) 30 tablet 3  . traMADol (ULTRAM) 50 MG tablet Take 1-2 tablets (50-100 mg total) by mouth every 6 (six) hours as needed for moderate pain. 30 tablet 0   No current facility-administered medications for this visit.    REVIEW OF SYSTEMS:   Constitutional: Denies fevers, chills or abnormal night sweats, (+) weight loss and anorexia Eyes: Denies blurriness of vision, double vision or watery eyes Ears, nose, mouth, throat, and face: Denies mucositis or sore throat Respiratory: Denies cough, dyspnea or wheezes Cardiovascular: Denies palpitation, chest discomfort or lower extremity swelling Gastrointestinal:  Less nausea, abdominal bloating and discomfort Skin: Denies abnormal skin rashes Lymphatics: Denies new lymphadenopathy or easy bruising Neurological:Denies numbness, tingling or new weaknesses Behavioral/Psych: Mood is stable, no  new changes  All other systems were reviewed with the patient and are negative except those mentioned in the history.  PHYSICAL EXAMINATION: ECOG PERFORMANCE STATUS: 1  GENERAL:alert, no distress and comfortable SKIN: skin color, texture, turgor are normal, no rashes or significant lesions EYES: normal, conjunctiva are pink and non-injected, sclera clear OROPHARYNX:no exudate, no erythema and lips, buccal mucosa, and tongue normal  NECK: supple, thyroid normal size, non-tender, without nodularity LYMPH:  no  palpable lymphadenopathy in the cervical, axillary or inguinal LUNGS: clear to auscultation and percussion with normal breathing effort HEART: regular rate & rhythm and no murmurs and no lower extremity edema ABDOMEN:abdomen soft, non-tender and normal bowel sounds, probable small amount of ascites. Musculoskeletal:no cyanosis of digits and no clubbing  PSYCH: alert & oriented x 3 with fluent speech NEURO: no focal motor/sensory deficits  LABORATORY DATA:  I have reviewed the data as listed Lab Results  Component Value Date   WBC 3.7* 07/16/2014   HGB 11.9* 07/16/2014   HCT 37.4* 07/16/2014   MCV 82.0 07/16/2014   PLT 115 Occ Large platelets present* 07/16/2014    Recent Labs  04/25/14 0434 04/26/14 0810 04/27/14 0453  06/18/14 0922 07/02/14 0959 07/16/14 0938  NA 137 135* 139  < > 142 145 141  K 4.2 3.9 4.2  < > 4.0 4.2 4.0  CL 99 98 100  --   --   --   --   CO2 _0 < > _1 GLUCOSE 88 144* 113*  < > 112 110 112  BUN _2 < > 12.6 12.0 15.4  CREATININE 0.98 0.90 0.92  < > 0.8 0.7 0.8  CALCIUM 8.7 8.8 8.7  < > 8.8 9.3 9.0  GFRNONAA >90 >90 >90  --   --   --   --   GFRAA >90 >90 >90  --   --   --   --   PROT 6.3  --  6.0  < > 6.4 6.0* 6.2*  ALBUMIN 2.3*  --  2.1*  < > 3.1* 2.9* 2.9*  AST 17  --  18  < > _3 ALT 10  --  9  < > _4 ALKPHOS 82  --  101  < > 111 101 108  BILITOT 0.5  --  0.5  < > 0.21 0.31 0.38  < > = values in this interval not displayed. Pathology report  Diagnosis  PERITONEAL/ASCITIC FLUID(SPECIMEN 1 OF 1 COLLECTED 04/26/14): METASTATIC ADENOCARCINOMA WITH SIGNET RING CELL FEATURES.  Stomach, biopsy, r/o cancer 04/25/14 - ADENOCARCINOMA WITH SIGNET RING FEATURES. Microscopic Comment The results were called to Dr. Ardis Hughs on 04/26/2014. (JDP:kh 04/26/14) Claudette Laws MD Pathologist, Electronic Signature (Case signed 04/26/2014)  HER-2/NEU BY CISH - NO AMPLIFICATION OF HER-2 DETECTED. RESULT RATIO OF HER2: CEP 17  SIGNALS 1.20 AVERAGE HER2 COPY NUMBER PER CELL 2.10  RADIOGRAPHIC STUDIES: I have personally reviewed the radiological images as listed and agreed with the findings in the report.  Ct Chest, abdomen and pelvis Wo Contrast12/24/2015    IMPRESSION:  1. Findings, as above, highly concerning for either esophageal or gastric primary malignancy, with metastatic lymphadenopathy in the upper abdomen and retroperitoneum, widespread intraperitoneal spread of disease, malignant ascites, and multiple pulmonary nodules in the visualize lung bases concerning for pulmonary metastasis. Oncologic evaluation is recommended.  2. Additional incidental findings, as above. These results were called by telephone at the time of interpretation  on 04/23/2014 at 7:53 pm to [place] , who verbally acknowledged these results.   Electronically Signed   By: Vinnie Langton M.D.   On: 04/23/2014 19:56   CT CAP 06/24/2014 IMPRESSION: 1. Progressive low density and calcifications within the extensive upper abdominal lymphadenopathy, likely related to partial treatment. Some of the lymph nodes do appear slightly larger. The irregular infiltrating mass involving the gastric cardia appears slightly improved. 2. No evidence of hepatic metastatic disease. 3. Overall volume of ascites has improved, although extensive changes of peritoneal carcinomatosis have not significantly changed. 4. No significant change in bilateral pulmonary nodules consistent with metastatic disease. 5. Resolution of bilateral pleural effusions.   EGD WITH BIOPSY 04/25/2014 ENDOSCOPIC IMPRESSION: There was a large, nearly circumferential, ulcerated, clearly malignant mass that stradles the GE junction. The vast bulk of the mass lays in the stomach, occupying about 1/3 of the stomach. The mass extends into the esophagus for about 4cm above the GE junction. Mutiple biopsies were taken from the gastric portion of the mass   I ASSESSMENT &  PLAN:  39 year old gentleman without significant past medical history presents with abdominal bloating, nausea, vomiting, diarrhea, anemia and weight loss. EGD showed a large mass arising from the gastric fundus and extending into GE junction, biopsy showed adenocarcinoma with signet ring features, CT scan is consistent with peritoneal carcinomatosis and bilateral lung metastasis. Ascites cytology was positive for malignant cells.  1. 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 Unfortunately, this is widely metastatic, not curable, and overall prognosis is very poor. Literature has showed gastric adenocarcinoma with signet ring features has overall worse outcome comparing to others. His life expectancy is probably less than 6 months without treatment. -He has started first-line chemotherapy FOLFOX, tolerated well, and his symptoms have much  improved. -His tumor is negative for HER-2 overexpression, no role for trastuzumab. -He had [facility] one test on his tumor, which was requested by his oncologist Dr. Rushie Nyhan at [facility] he will bring the report to me next time -His recent restaging CT scan showed mild partial response, overall stable disease. His ascites has significantly decreased. Pleural effusion resolved. He is clinically doing well. I recommend to continue the current FOLFOX chemotherapy. -he is interested in pursuing [facility] in [place], which is a intravenous infusion for total of 2 weeks. I'm okay with it, he will probably need a chemotherapy break during that time. He will likely do it after 3-4 months of chemotherapy. -Clinical trial has showed excellent response (about 70%) in non-colorectal cancers with MSI high. I will request his biopsy to be tested for MMR and MSI.  If his tumor is MSI high, I would strongly favor immunotherapy, probably in a clinical trial setting or off label use.  2. Cytopenia, secondary to chemotherapy -His ANC 1.3, platelet 115K today -Okay to proceed with chemotherapy but overall skip the 5-FU bolus -Lab the day before next cycle chemotherapy, if not adequate for treatment, we'll postpone his next cycle chemotherapy  2. Malignant ascites -Much improved since he started chemotherapy. -Paracentesis as needed in the future.  3. Anorexia, nausea, malnutrition -Those are all related to his cancer, his symptom has much improved since he started chemotherapy. -cont mirtazapine 15 mg at bedtime for his anorexia. -Continue Zofran and Compazine for nausea as needed -I encouraged him to  continue nutrition supplement, dietitian consult  4. Anemia, multifactorial (iron deficient anemia, cancer related and chemotherapy induced ) -His ferritin level was normal at 171, but he has low iron and saturation, likely some degree of iron deficient anemia. -He has received IV Feraheme 510 mg twice, 2 weeks apart -Blood transfusion as needed during chemotherapy.    Plan #1 cycle 6 FOLFOX today,  No 5-FU bolus, Neulasta on day 3  #2 RTC in 2 weeks, lab the day before to see if his blood counts is adequate for treatment.  Patient and his family members had many questions. All questions were answered. The patient knows to call the clinic with any problems, questions or concerns.  I spent 15 minutes counseling the patient face to face. The total time spent in the appointment was 25  minutes and more than 50% was on counseling.     Truitt Merle, MD 07/16/2014

## 2014-07-16 NOTE — Patient Instructions (Signed)
[facility] Discharge Instructions for Patients Receiving Chemotherapy  Today you received the following chemotherapy agents:  Oxaliplatin, Leucovorin and 5FU. We did not do the IV push of the 5FU today because your white blood cell count is low.  However, the 5FU is running in your pump. When you return to have your pump disconnected on Thursday, you will also receive a neulasta injection.  To help prevent nausea and vomiting after your treatment, we encourage you to take your nausea medication as ordered per MD.   If you develop nausea and vomiting that is not controlled by your nausea medication, call the clinic.   BELOW ARE SYMPTOMS THAT SHOULD BE REPORTED IMMEDIATELY:  *FEVER GREATER THAN 100.5 F  *CHILLS WITH OR WITHOUT FEVER  NAUSEA AND VOMITING THAT IS NOT CONTROLLED WITH YOUR NAUSEA MEDICATION  *UNUSUAL SHORTNESS OF BREATH  *UNUSUAL BRUISING OR BLEEDING  TENDERNESS IN MOUTH AND THROAT WITH OR WITHOUT PRESENCE OF ULCERS  *URINARY PROBLEMS  *BOWEL PROBLEMS  UNUSUAL RASH Items with * indicate a potential emergency and should be followed up as soon as possible.  Feel free to call the clinic you have any questions or concerns. The clinic phone number is [phone].

## 2014-07-16 NOTE — Progress Notes (Signed)
Patient seen by Dr. Burr Medico.  OK to treat today despite ANC of 1.3.  Patient will not get IV bolus of 29fu and he will get neulasta with pump d/c.

## 2014-07-17 ENCOUNTER — Other Ambulatory Visit: Payer: BLUE CROSS/BLUE SHIELD

## 2014-07-17 ENCOUNTER — Ambulatory Visit: Payer: BLUE CROSS/BLUE SHIELD

## 2014-07-18 ENCOUNTER — Ambulatory Visit (HOSPITAL_BASED_OUTPATIENT_CLINIC_OR_DEPARTMENT_OTHER): Payer: BLUE CROSS/BLUE SHIELD

## 2014-07-18 ENCOUNTER — Ambulatory Visit: Payer: BLUE CROSS/BLUE SHIELD

## 2014-07-18 DIAGNOSIS — Z452 Encounter for adjustment and management of vascular access device: Secondary | ICD-10-CM

## 2014-07-18 DIAGNOSIS — Z5189 Encounter for other specified aftercare: Secondary | ICD-10-CM

## 2014-07-18 DIAGNOSIS — C169 Malignant neoplasm of stomach, unspecified: Secondary | ICD-10-CM

## 2014-07-18 MED ORDER — HEPARIN SOD (PORK) LOCK FLUSH 100 UNIT/ML IV SOLN
500.0000 [IU] | Freq: Once | INTRAVENOUS | Status: AC | PRN
  Administered 2014-07-18: 500 [IU]
  Filled 2014-07-18: qty 5

## 2014-07-18 MED ORDER — PEGFILGRASTIM INJECTION 6 MG/0.6ML ~~LOC~~
6.0000 mg | PREFILLED_SYRINGE | Freq: Once | SUBCUTANEOUS | Status: AC
  Administered 2014-07-18: 6 mg via SUBCUTANEOUS
  Filled 2014-07-18: qty 0.6

## 2014-07-18 MED ORDER — SODIUM CHLORIDE 0.9 % IJ SOLN
10.0000 mL | INTRAMUSCULAR | Status: DC | PRN
  Administered 2014-07-18: 10 mL
  Filled 2014-07-18: qty 10

## 2014-07-18 NOTE — Progress Notes (Signed)
Neulasta injection given by the flush nurse

## 2014-07-18 NOTE — Patient Instructions (Signed)
Pegfilgrastim injection What is this medicine? PEGFILGRASTIM (peg fil GRA stim) is a long-acting granulocyte colony-stimulating factor that stimulates the growth of neutrophils, a type of white blood cell important in the body's fight against infection. It is used to reduce the incidence of fever and infection in patients with certain types of cancer who are receiving chemotherapy that affects the bone marrow. This medicine may be used for other purposes; ask your health care provider or pharmacist if you have questions. COMMON BRAND NAME(S): Neulasta What should I tell my health care provider before I take this medicine? They need to know if you have any of these conditions: -latex allergy -ongoing radiation therapy -sickle cell disease -skin reactions to acrylic adhesives (On-Body Injector only) -an unusual or allergic reaction to pegfilgrastim, filgrastim, other medicines, foods, dyes, or preservatives -pregnant or trying to get pregnant -breast-feeding How should I use this medicine? This medicine is for injection under the skin. If you get this medicine at home, you will be taught how to prepare and give the pre-filled syringe or how to use the On-body Injector. Refer to the patient Instructions for Use for detailed instructions. Use exactly as directed. Take your medicine at regular intervals. Do not take your medicine more often than directed. It is important that you put your used needles and syringes in a special sharps container. Do not put them in a trash can. If you do not have a sharps container, call your pharmacist or healthcare provider to get one. Talk to your pediatrician regarding the use of this medicine in children. Special care may be needed. Overdosage: If you think you have taken too much of this medicine contact a poison control center or emergency room at once. NOTE: This medicine is only for you. Do not share this medicine with others. What if I miss a dose? It is  important not to miss your dose. Call your doctor or health care professional if you miss your dose. If you miss a dose due to an On-body Injector failure or leakage, a new dose should be administered as soon as possible using a single prefilled syringe for manual use. What may interact with this medicine? Interactions have not been studied. Give your health care provider a list of all the medicines, herbs, non-prescription drugs, or dietary supplements you use. Also tell them if you smoke, drink alcohol, or use illegal drugs. Some items may interact with your medicine. This list may not describe all possible interactions. Give your health care provider a list of all the medicines, herbs, non-prescription drugs, or dietary supplements you use. Also tell them if you smoke, drink alcohol, or use illegal drugs. Some items may interact with your medicine. What should I watch for while using this medicine? You may need blood work done while you are taking this medicine. If you are going to need a MRI, CT scan, or other procedure, tell your doctor that you are using this medicine (On-Body Injector only). What side effects may I notice from receiving this medicine? Side effects that you should report to your doctor or health care professional as soon as possible: -allergic reactions like skin rash, itching or hives, swelling of the face, lips, or tongue -dizziness -fever -pain, redness, or irritation at site where injected -pinpoint red spots on the skin -shortness of breath or breathing problems -stomach or side pain, or pain at the shoulder -swelling -tiredness -trouble passing urine Side effects that usually do not require medical attention (report to your doctor   or health care professional if they continue or are bothersome): -bone pain -muscle pain This list may not describe all possible side effects. Call your doctor for medical advice about side effects. You may report side effects to FDA at  1-800-FDA-1088. Where should I keep my medicine? Keep out of the reach of children. Store pre-filled syringes in a refrigerator between 2 and 8 degrees C (36 and 46 degrees F). Do not freeze. Keep in carton to protect from light. Throw away this medicine if it is left out of the refrigerator for more than 48 hours. Throw away any unused medicine after the expiration date. NOTE: This sheet is a summary. It may not cover all possible information. If you have questions about this medicine, talk to your doctor, pharmacist, or health care provider.  2015, [employer] Standard. (2013-07-26 16:14:05) Fluorouracil, 5FU; Diclofenac topical cream What is this medicine? FLUOROURACIL; DICLOFENAC (flure oh YOOR a sil; dye KLOE fen ak) is a combination of a topical chemotherapy agent and non-steroidal anti-inflammatory drug (NSAID). It is used on the skin to treat skin cancer and skin conditions that could become cancer. This medicine may be used for other purposes; ask your health care provider or pharmacist if you have questions. COMMON BRAND NAME(S): FLUORAC What should I tell my health care provider before I take this medicine? They need to know if you have any of these conditions: -bleeding problems -cigarette smoker -DPD enzyme deficiency -heart disease -high blood pressure -if you frequently drink alcohol containing drinks -kidney disease -liver disease -open or infected skin -stomach problems -swelling or open sores at the treatment site -recent or planned coronary artery bypass graft (CABG) surgery -an unusual or allergic reaction to fluorouracil, diclofenac, aspirin, other NSAIDs, other medicines, foods, dyes, or preservatives -pregnant or trying to get pregnant -breast-feeding How should I use this medicine? This medicine is only for use on the skin. Follow the directions on the prescription label. Wash hands before and after use. Wash affected area and gently pat dry. To apply this  medicine use a cotton-tipped applicator, or use gloves if applying with fingertips. If applied with unprotected fingertips, it is very important to wash your hands well after you apply this medicine. Avoid applying to the eyes, nose, or mouth. Apply enough medicine to cover the affected area. You can cover the area with a light gauze dressing, but do not use tight or air-tight dressings. Finish the full course prescribed by your doctor or health care professional, even if you think your condition is better. Do not stop taking except on the advice of your doctor or health care professional. Talk to your pediatrician regarding the use of this medicine in children. Special care may be needed. Overdosage: If you think you've taken too much of this medicine contact a poison control center or emergency room at once. Overdosage: If you think you have taken too much of this medicine contact a poison control center or emergency room at once. NOTE: This medicine is only for you. Do not share this medicine with others. What if I miss a dose? If you miss a dose, apply it as soon as you can. If it is almost time for your next dose, only use that dose. Do not apply extra doses. Contact your doctor or health care professional if you miss more than one dose. What may interact with this medicine? Interactions are not expected. Do not use any other skin products without telling your doctor or health care professional. This list   may not describe all possible interactions. Give your health care provider a list of all the medicines, herbs, non-prescription drugs, or dietary supplements you use. Also tell them if you smoke, drink alcohol, or use illegal drugs. Some items may interact with your medicine. What should I watch for while using this medicine? Visit your doctor or health care professional for checks on your progress. You will need to use this medicine for 2 to 6 weeks. This may be longer depending on the condition  being treated. You may not see full healing for another 1 to 2 months after you stop using the medicine. Treated areas of skin can look unsightly during and for several weeks after treatment with this medicine. This medicine can make you more sensitive to the sun. Keep out of the sun. If you cannot avoid being in the sun, wear protective clothing and use sunscreen. Do not use sun lamps or tanning beds/booths. Where should I keep my What side effects may I notice from receiving this medicine? Side effects that you should report to your doctor or health care professional as soon as possible: -allergic reactions like skin rash, itching or hives, swelling of the face, lips, or tongue -black or bloody stools, blood in the urine or vomit -blurred vision -chest pain -difficulty breathing or wheezing -redness, blistering, peeling or loosening of the skin, including inside the mouth -severe redness and swelling of normal skin -slurred speech or weakness on one side of the body -trouble passing urine or change in the amount of urine -unexplained weight gain or swelling -unusually weak or tired -yellowing of eyes or skin Side effects that usually do not require medical attention (Report these to your doctor or health care professional if they continue or are bothersome.): -increased sensitivity of the skin to sun and ultraviolet light -pain and burning of the affected area -scaling or swelling of the affected area -skin rash, itching of the affected area -tenderness This list may not describe all possible side effects. Call your doctor for medical advice about side effects. You may report side effects to FDA at 1-800-FDA-1088. Where should I keep my medicine? Keep out of the reach of children. Store at room temperature between 20 and 25 degrees C (68 and 77 degrees F). Throw away any unused medicine after the expiration date. NOTE: This sheet is a summary. It may not cover all possible information.  If you have questions about this medicine, talk to your doctor, pharmacist, or health care provider.  2015, [employer]. (2013-08-27 11:09:58)  

## 2014-07-22 ENCOUNTER — Encounter: Payer: Self-pay | Admitting: Hematology

## 2014-07-22 NOTE — Progress Notes (Signed)
I faxed office notes/labs to [employer] [phone] for LTD claim.

## 2014-07-29 ENCOUNTER — Other Ambulatory Visit (HOSPITAL_BASED_OUTPATIENT_CLINIC_OR_DEPARTMENT_OTHER): Payer: BLUE CROSS/BLUE SHIELD

## 2014-07-29 ENCOUNTER — Ambulatory Visit (HOSPITAL_BASED_OUTPATIENT_CLINIC_OR_DEPARTMENT_OTHER): Payer: BLUE CROSS/BLUE SHIELD | Admitting: Hematology

## 2014-07-29 ENCOUNTER — Other Ambulatory Visit: Payer: BLUE CROSS/BLUE SHIELD

## 2014-07-29 ENCOUNTER — Encounter: Payer: Self-pay | Admitting: Hematology

## 2014-07-29 VITALS — BP 119/69 | HR 62 | Temp 97.7°F | Resp 19 | Ht 69.0 in | Wt 274.6 lb

## 2014-07-29 DIAGNOSIS — C7800 Secondary malignant neoplasm of unspecified lung: Secondary | ICD-10-CM

## 2014-07-29 DIAGNOSIS — C786 Secondary malignant neoplasm of retroperitoneum and peritoneum: Secondary | ICD-10-CM

## 2014-07-29 DIAGNOSIS — D509 Iron deficiency anemia, unspecified: Secondary | ICD-10-CM

## 2014-07-29 DIAGNOSIS — C169 Malignant neoplasm of stomach, unspecified: Secondary | ICD-10-CM

## 2014-07-29 DIAGNOSIS — R110 Nausea: Secondary | ICD-10-CM

## 2014-07-29 DIAGNOSIS — D6481 Anemia due to antineoplastic chemotherapy: Secondary | ICD-10-CM

## 2014-07-29 DIAGNOSIS — D759 Disease of blood and blood-forming organs, unspecified: Secondary | ICD-10-CM

## 2014-07-29 DIAGNOSIS — E46 Unspecified protein-calorie malnutrition: Secondary | ICD-10-CM

## 2014-07-29 DIAGNOSIS — R630 Anorexia: Secondary | ICD-10-CM

## 2014-07-29 DIAGNOSIS — R180 Malignant ascites: Secondary | ICD-10-CM

## 2014-07-29 DIAGNOSIS — D630 Anemia in neoplastic disease: Secondary | ICD-10-CM

## 2014-07-29 LAB — CBC WITH DIFFERENTIAL/PLATELET
BASO%: 1.1 % (ref 0.0–2.0)
BASOS ABS: 0.1 10*3/uL (ref 0.0–0.1)
EOS%: 1.4 % (ref 0.0–7.0)
Eosinophils Absolute: 0.1 10*3/uL (ref 0.0–0.5)
HCT: 40.7 % (ref 38.4–49.9)
HGB: 12.8 g/dL — ABNORMAL LOW (ref 13.0–17.1)
LYMPH#: 1.6 10*3/uL (ref 0.9–3.3)
LYMPH%: 27.4 % (ref 14.0–49.0)
MCH: 26.4 pg — ABNORMAL LOW (ref 27.2–33.4)
MCHC: 31.4 g/dL — ABNORMAL LOW (ref 32.0–36.0)
MCV: 83.9 fL (ref 79.3–98.0)
MONO#: 0.7 10*3/uL (ref 0.1–0.9)
MONO%: 12.3 % (ref 0.0–14.0)
NEUT%: 57.8 % (ref 39.0–75.0)
NEUTROS ABS: 3.4 10*3/uL (ref 1.5–6.5)
PLATELETS: 112 10*3/uL — AB (ref 140–400)
RBC: 4.85 10*6/uL (ref 4.20–5.82)
RDW: 25.3 % — AB (ref 11.0–14.6)
WBC: 5.9 10*3/uL (ref 4.0–10.3)

## 2014-07-29 LAB — COMPREHENSIVE METABOLIC PANEL (CC13)
ALT: 14 U/L (ref 0–55)
AST: 18 U/L (ref 5–34)
Albumin: 2.9 g/dL — ABNORMAL LOW (ref 3.5–5.0)
Alkaline Phosphatase: 128 U/L (ref 40–150)
Anion Gap: 9 mEq/L (ref 3–11)
BUN: 15.1 mg/dL (ref 7.0–26.0)
CALCIUM: 8.8 mg/dL (ref 8.4–10.4)
CO2: 25 meq/L (ref 22–29)
Chloride: 108 mEq/L (ref 98–109)
Creatinine: 0.8 mg/dL (ref 0.7–1.3)
GLUCOSE: 96 mg/dL (ref 70–140)
POTASSIUM: 4.1 meq/L (ref 3.5–5.1)
SODIUM: 142 meq/L (ref 136–145)
TOTAL PROTEIN: 6.3 g/dL — AB (ref 6.4–8.3)
Total Bilirubin: 0.35 mg/dL (ref 0.20–1.20)

## 2014-07-29 NOTE — Progress Notes (Signed)
[facility]  [phone] Fax:([phone]) [place] Note   No care team member to display 07/29/2014  CHIEF COMPLAINTS Follow up metastatic gastric cancer    Gastric adenocarcinoma: Stage IV   04/23/2014 Imaging CT abdomen showed peritoneal carcinomatosis, ascites, thickning of gastric and GEJ wall, CT chest showed a few small b/l lung nodules.    04/24/2014 Tumor Marker CEA  5.6, CA19.9 13   04/25/2014 Initial Diagnosis Gastric adenocarcinoma: Stage IV   04/25/2014 Pathology Results Stomach mass biopsy showed - ADENOCARCINOMA WITH SIGNET RING FEATURES. ascites cytology (+)   04/25/2014 Procedure EGD: There was a large, nearly circumferential, ulcerated, clearly malignant mass that stradles the GE junction. The vast bulk of the mass lays in the stomach.    CURRENT THERAPY: [id number] started on 05/08/2015  HISTORY OF PRESENTING ILLNESS:  Samuel Durham 39 y.o. 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 I saw him first when he was recently admitted to [facility]. He is here for the first office visit after hospital discharge.  He was  admitted on 12/15 with progressive, 4 month history of dysphagia, epigastric abdominal discomfort and bloating, early satiation accompanied by nausea, and postprandial vomiting and diarrhea. He has experienced a 30 lbs weight loss in the process. He has noted increased abdominal girth. Patient denies any shortness of breath, chest pain, GI bleed, hematuria or lower extremity swelling. He denies heavy alcohol intake. Denies tobacco abuse. Denies risk factors for HIV or hepatitis. He denies any family history of GI malignancies.  CT of the abdomen and pelvis with contrast on 12/15 revealed several pulmonary nodules at the bases bilaterally, profound thickening of the distal esophagus extending beyond the gastroesophageal junction into the proximal stomach,cardia and fundus of the  stomach, most evident along the lesser curvature. No pathologic dilatation of small bowel or colon. Extensive upper abdominal and retroperitoneal lymphadenopathy was visualized. Moderate volume of ascites, presumably malignant, was noted. Extensive soft tissue nodularity throughout the omentum, compatible with omental caking and widespread peritoneal metastasis. Additionally, there are other areas of enhancing nodularity along the peritoneal surface. Tiny ventral and umbilical hernias containing predominantly omental fat, including some omental implants. There are no aggressive appearing lytic or blastic lesions noted in the visualized portions of the skeleton.  He underwent an EGD by Dr. Ardis Hughs on 04/25/2014, which showed a large fungating mass that straddles the GE junction and occupying the upper 1/3 part of the stomach. Multiple biopsy was taken which showed adenocarcinoma with signet ring features. He also underwent a paracentesis with 4.5 L fluid is removed on 04/26/2014. Cytology was positive for adenocarcinoma with signet ring features, consistent with metastatic disease.  He was discharged to home on 04/27/2014. He did feel a slightly informant of his abdominal bloating after the paracentesis, but not much improvement other symptoms. He had Mediport placement and the second paracentesis was again 4 L fluids removal on 05/02/2014. His appetite is low, he drinks ensure 3 bottles a day, some soup and a very little other foods. His energy level is also low, but able to take care of all his daily needs, and move around with about much difficulty. His abdominal discomfort/pain are relatively controlled by morphine 15 mg at night and tramadol 2-3 times during the day. He denies any fever, chills, cough, chest pain or any in the other discomfort.  INTERIM HISTORY: He returns for follow up and cycle 7 chemotherapy. He has been doing well overall and  tolerating the chemo well. He denies any significant  abdominal pain or bloating. He is eating well, his energy level is fairly good  and he is remains physically. No fever or chills, no signs of bleeding.  MEDICAL HISTORY:  Past Medical History  Diagnosis Date  . Medical history non-contributory     SURGICAL HISTORY: Past Surgical History  Procedure Laterality Date  . Squamous cell carcinoma excision  approx March 2015    removed from top of head  . Esophagogastroduodenoscopy (egd) with propofol N/A 04/25/2014    Procedure: ESOPHAGOGASTRODUODENOSCOPY (EGD) WITH PROPOFOL;  Surgeon: Milus Banister, MD;  Location: [facility] ENDOSCOPY;  Service: Endoscopy;  Laterality: N/A;    SOCIAL HISTORY: History   Social History  . Marital Status: Single    Spouse Name: N/A  . Number of Children: N/A  . Years of Education: N/A   Occupational History  . Not on file.   Social History Main Topics  . Smoking status: Never Smoker   . Smokeless tobacco: Never Used  . Alcohol Use: No  . Drug Use: No  . Sexual Activity: Not on file   Other Topics Concern  . Not on file   Social History Narrative    FAMILY HISTORY: Family History  Problem Relation Age of Onset  . Breast cancer Sister 42   . Hypertension Mother   . Hypertension Father   Maternal aunt breast cancer at age of 42 Maternal cousin ovarian cancer at age of 80 Maternal ancle prostate cancer age of 38   ALLERGIES:  has No Known Allergies.  MEDICATIONS:  Current Outpatient Prescriptions  Medication Sig Dispense Refill  . docusate sodium 100 MG CAPS Take 100 mg by mouth 2 (two) times daily. 10 capsule 0  . lidocaine-prilocaine (EMLA) cream Apply 1 application topically as needed. 30 g 0  . mirtazapine (REMERON) 15 MG tablet Take 1 tablet (15 mg total) by mouth at bedtime. 30 tablet 1  . morphine (MS CONTIN) 15 MG 12 hr tablet Take 1 tablet (15 mg total) by mouth every 12 (twelve) hours. 60 tablet 0  . ondansetron (ZOFRAN) 4 MG tablet Take 1 tablet (4 mg total) by mouth every 8  (eight) hours as needed for nausea or vomiting. 20 tablet 0  . pantoprazole (PROTONIX) 40 MG tablet Take 1 tablet (40 mg total) by mouth daily. 30 tablet 0  . prochlorperazine (COMPAZINE) 10 MG tablet Take 1 tablet (10 mg total) by mouth every 6 (six) hours as needed for nausea or vomiting. 30 tablet 3  . traMADol (ULTRAM) 50 MG tablet Take 1-2 tablets (50-100 mg total) by mouth every 6 (six) hours as needed for moderate pain. 30 tablet 0   No current facility-administered medications for this visit.    REVIEW OF SYSTEMS:   Constitutional: Denies fevers, chills or abnormal night sweats, (+) weight loss and anorexia Eyes: Denies blurriness of vision, double vision or watery eyes Ears, nose, mouth, throat, and face: Denies mucositis or sore throat Respiratory: Denies cough, dyspnea or wheezes Cardiovascular: Denies palpitation, chest discomfort or lower extremity swelling Gastrointestinal:  Less nausea, abdominal bloating and discomfort Skin: Denies abnormal skin rashes Lymphatics: Denies new lymphadenopathy or easy bruising Neurological:Denies numbness, tingling or new weaknesses Behavioral/Psych: Mood is stable, no new changes  All other systems were reviewed with the patient and are negative except those mentioned in the history.  PHYSICAL EXAMINATION: ECOG PERFORMANCE STATUS: 1  GENERAL:alert, no distress and comfortable SKIN: skin color, texture, turgor are normal, no  rashes or significant lesions EYES: normal, conjunctiva are pink and non-injected, sclera clear OROPHARYNX:no exudate, no erythema and lips, buccal mucosa, and tongue normal  NECK: supple, thyroid normal size, non-tender, without nodularity LYMPH:  no palpable lymphadenopathy in the cervical, axillary or inguinal LUNGS: clear to auscultation and percussion with normal breathing effort HEART: regular rate & rhythm and no murmurs and no lower extremity edema ABDOMEN:abdomen soft, non-tender and normal bowel sounds,  probable small amount of ascites. Musculoskeletal:no cyanosis of digits and no clubbing  PSYCH: alert & oriented x 3 with fluent speech NEURO: no focal motor/sensory deficits  LABORATORY DATA:  I have reviewed the data as listed Lab Results  Component Value Date   WBC 5.9 07/29/2014   HGB 12.8* 07/29/2014   HCT 40.7 07/29/2014   MCV 83.9 07/29/2014   PLT 112* 07/29/2014    Recent Labs  04/25/14 0434 04/26/14 0810 04/27/14 0453  06/18/14 0922 07/02/14 0959 07/16/14 0938  NA 137 135* 139  < > 142 145 141  K 4.2 3.9 4.2  < > 4.0 4.2 4.0  CL 99 98 100  --   --   --   --   CO2 _0 < > _1 GLUCOSE 88 144* 113*  < > 112 110 112  BUN _2 < > 12.6 12.0 15.4  CREATININE 0.98 0.90 0.92  < > 0.8 0.7 0.8  CALCIUM 8.7 8.8 8.7  < > 8.8 9.3 9.0  GFRNONAA >90 >90 >90  --   --   --   --   GFRAA >90 >90 >90  --   --   --   --   PROT 6.3  --  6.0  < > 6.4 6.0* 6.2*  ALBUMIN 2.3*  --  2.1*  < > 3.1* 2.9* 2.9*  AST 17  --  18  < > _3 ALT 10  --  9  < > _4 ALKPHOS 82  --  101  < > 111 101 108  BILITOT 0.5  --  0.5  < > 0.21 0.31 0.38  < > = values in this interval not displayed. Pathology report  Diagnosis  PERITONEAL/ASCITIC FLUID(SPECIMEN 1 OF 1 COLLECTED 04/26/14): METASTATIC ADENOCARCINOMA WITH SIGNET RING CELL FEATURES.  Stomach, biopsy, r/o cancer 04/25/14 - ADENOCARCINOMA WITH SIGNET RING FEATURES. Microscopic Comment The results were called to Dr. Ardis Hughs on 04/26/2014. (JDP:kh 04/26/14) Claudette Laws MD Pathologist, Electronic Signature (Case signed 04/26/2014)  HER-2/NEU BY CISH - NO AMPLIFICATION OF HER-2 DETECTED. RESULT RATIO OF HER2: CEP 17 SIGNALS 1.20 AVERAGE HER2 COPY NUMBER PER CELL 2.10  ADDITIONAL INFORMATION: Mismatch Repair (MMR) Protein Immunohistochemistry (IHC) IHC Expression Result: MLH1: Preserved nuclear expression (greater 50% tumor expression) MSH2: Preserved nuclear expression (greater 50% tumor expression) MSH6:  Preserved nuclear expression (greater 50% tumor expression) PMS2: Preserved nuclear expression (greater 50% tumor expression) * Internal control demonstrates intact nuclear expression Interpretation: NORMAL There is preserved expression of the major and minor MMR proteins. There is a very low probability that microsatellite instability (MSI) is present. However, certain clinically significant MMR protein mutations may result in preservation of nuclear expression. It is recommended that the preservation of protein expression be correlated with molecular based MSI testing.  [employer] testing: (+) JI96 and NF1   RADIOGRAPHIC STUDIES: I have personally reviewed the radiological images as listed and agreed with the findings in the report.  Ct Chest, abdomen and pelvis  Wo Contrast12/24/2015    IMPRESSION:  1. Findings, as above, highly concerning for either esophageal or gastric primary malignancy, with metastatic lymphadenopathy in the upper abdomen and retroperitoneum, widespread intraperitoneal spread of disease, malignant ascites, and multiple pulmonary nodules in the visualize lung bases concerning for pulmonary metastasis. Oncologic evaluation is recommended.  2. Additional incidental findings, as above. These results were called by telephone at the time of interpretation on 04/23/2014 at 7:53 pm to [place] , who verbally acknowledged these results.   Electronically Signed   By: Vinnie Langton M.D.   On: 04/23/2014 19:56   CT CAP 06/24/2014 IMPRESSION: 1. Progressive low density and calcifications within the extensive upper abdominal lymphadenopathy, likely related to partial treatment. Some of the lymph nodes do appear slightly larger. The irregular infiltrating mass involving the gastric cardia appears slightly improved. 2. No evidence of hepatic metastatic disease. 3. Overall volume of ascites has improved, although extensive changes of peritoneal carcinomatosis  have not significantly changed. 4. No significant change in bilateral pulmonary nodules consistent with metastatic disease. 5. Resolution of bilateral pleural effusions.   EGD WITH BIOPSY 04/25/2014 ENDOSCOPIC IMPRESSION: There was a large, nearly circumferential, ulcerated, clearly malignant mass that stradles the GE junction. The vast bulk of the mass lays in the stomach, occupying about 1/3 of the stomach. The mass extends into the esophagus for about 4cm above the GE junction. Mutiple biopsies were taken from the gastric portion of the mass   I ASSESSMENT & PLAN:  39 year old gentleman without significant past medical history presents with abdominal bloating, nausea, vomiting, diarrhea, anemia and weight loss. EGD showed a large mass arising from the gastric fundus and extending into GE junction, biopsy showed adenocarcinoma with signet ring features, CT scan is consistent with peritoneal carcinomatosis and bilateral lung metastasis. Ascites cytology was positive for malignant cells.  1. 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 HER-2 negative, MMR-normal -I discussed his surgical biopsy results, cytology results, skin findings with patient and his family members, including his parents and 2 sisters. Unfortunately, this is widely metastatic, not curable, and overall prognosis is very poor. Literature has showed gastric adenocarcinoma with signet ring features has overall worse outcome comparing to others. His life expectancy is probably less than 6 months without treatment. -He has started first-line chemotherapy FOLFOX, tolerated well, and his symptoms have much  improved. -His tumor is negative for HER-2 overexpression, no role for trastuzumab. -We also checked his tumor for MMR, which was normal -Foundation one tonight test reviewed no actionable mutation -He had Foundation one test on his tumor, which was requested by his oncologist  Dr. Rushie Nyhan at [facility], he will bring the report to me next time -His recent restaging CT scan showed mild partial response, overall stable disease. His ascites has significantly decreased. Pleural effusion resolved. He is clinically doing well. I recommend to continue the current FOLFOX chemotherapy. -he is interested in pursuing [facility] in [place], which is a intravenous infusion for total of 2 weeks. I'm okay with it, he will probably need a chemotherapy break during that time. He will likely do it after 6 months of chemotherapy. -Mild thrombocytopenia, OK to treat. Watch blood counts Carefully.  2. Cytopenia, secondary to chemotherapy -His ANC 3.4, platelet 112 today -Okay to proceed with chemotherapy but will skip the 5-FU bolus -Lab the day before next cycle chemotherapy, if not adequate for treatment, we'll postpone his next cycle chemotherapy  2. Malignant ascites -Much improved  since he started chemotherapy. -Paracentesis as needed in the future.  3. Anorexia, nausea, malnutrition -Those are all related to his cancer, his symptom has much improved since he started chemotherapy. -cont mirtazapine 15 mg at bedtime for his anorexia. -Continue Zofran and Compazine for nausea as needed -I encouraged him to continue nutrition supplement, dietitian consult  4. Anemia, multifactorial (iron deficient anemia, cancer related and chemotherapy induced ) -His ferritin level was normal at 171, but he has low iron and saturation, likely some degree of iron deficient anemia. -He has received IV Feraheme 510 mg twice, 2 weeks apart -Blood transfusion as needed during chemotherapy.    Plan #1 cycle 7 FOLFOX tomorrow,  No 5-FU bolus, no Neulasta on day 3  #2 RTC in 2 weeks, lab the day before to see if his blood counts is adequate for treatment.  Patient and his family members had many questions. All questions were answered. The patient knows to call the clinic with any problems,  questions or concerns.  I spent 15 minutes counseling the patient face to face. The total time spent in the appointment was 25  minutes and more than 50% was on counseling.     Truitt Merle, MD 07/29/2014

## 2014-07-30 ENCOUNTER — Ambulatory Visit: Payer: BLUE CROSS/BLUE SHIELD | Admitting: Nutrition

## 2014-07-30 ENCOUNTER — Ambulatory Visit (HOSPITAL_BASED_OUTPATIENT_CLINIC_OR_DEPARTMENT_OTHER): Payer: BLUE CROSS/BLUE SHIELD

## 2014-07-30 ENCOUNTER — Other Ambulatory Visit: Payer: BLUE CROSS/BLUE SHIELD

## 2014-07-30 DIAGNOSIS — Z5111 Encounter for antineoplastic chemotherapy: Secondary | ICD-10-CM | POA: Diagnosis not present

## 2014-07-30 DIAGNOSIS — C169 Malignant neoplasm of stomach, unspecified: Secondary | ICD-10-CM | POA: Diagnosis not present

## 2014-07-30 DIAGNOSIS — C786 Secondary malignant neoplasm of retroperitoneum and peritoneum: Secondary | ICD-10-CM

## 2014-07-30 DIAGNOSIS — D509 Iron deficiency anemia, unspecified: Secondary | ICD-10-CM

## 2014-07-30 DIAGNOSIS — C7800 Secondary malignant neoplasm of unspecified lung: Secondary | ICD-10-CM

## 2014-07-30 MED ORDER — SODIUM CHLORIDE 0.9 % IV SOLN: Freq: Once | INTRAVENOUS | Status: DC

## 2014-07-30 MED ORDER — OXALIPLATIN CHEMO INJECTION 100 MG/20ML
80.0000 mg/m2 | Freq: Once | INTRAVENOUS | Status: AC
  Administered 2014-07-30: 200 mg via INTRAVENOUS
  Filled 2014-07-30: qty 40

## 2014-07-30 MED ORDER — DEXTROSE 5 % IV SOLN
Freq: Once | INTRAVENOUS | Status: AC
  Administered 2014-07-30: 11:00:00 via INTRAVENOUS

## 2014-07-30 MED ORDER — LEUCOVORIN CALCIUM INJECTION 350 MG
400.0000 mg/m2 | Freq: Once | INTRAVENOUS | Status: AC
  Administered 2014-07-30: 996 mg via INTRAVENOUS
  Filled 2014-07-30: qty 49.8

## 2014-07-30 MED ORDER — SODIUM CHLORIDE 0.9 % IV SOLN
Freq: Once | INTRAVENOUS | Status: AC
  Administered 2014-07-30: 11:00:00 via INTRAVENOUS
  Filled 2014-07-30: qty 4

## 2014-07-30 MED ORDER — FLUOROURACIL CHEMO INJECTION 5 GM/100ML
2400.0000 mg/m2 | INTRAVENOUS | Status: DC
  Administered 2014-07-30: 6000 mg via INTRAVENOUS
  Filled 2014-07-30: qty 120

## 2014-07-30 NOTE — Progress Notes (Signed)
Nutrition follow up completed with patient in chemotherapy for metastatic gastric cancer. Patient states he is eating well and denies nutrition side effects. Weight stable overall and documented as 274.6 pounds 3/21 from 276.1 pounds 1/26. Patient has questions regarding supplements recommended with mistletoe treatments.  Nutrition Diagnosis:  Inadequate oral intake improved.  Intervention: Educated patient to continue strategies for adequate calories and protein for weight maintenance. Educated patient on supplement usage. Teach back method used.  Monitoring, evaluation, goals:  Patient will tolerate adequate calories and protein to promote continued weight stabilization.  Next Visit: Tuesday, April 19, during infusion.

## 2014-07-30 NOTE — Patient Instructions (Signed)
[facility] Discharge Instructions for Patients Receiving Chemotherapy  Today you received the following chemotherapy agents oxaliplatin/leucovorin/fluorouracil  To help prevent nausea and vomiting after your treatment, we encourage you to take your nausea medication as directed If you develop nausea and vomiting that is not controlled by your nausea medication, call the clinic.   BELOW ARE SYMPTOMS THAT SHOULD BE REPORTED IMMEDIATELY:  *FEVER GREATER THAN 100.5 F  *CHILLS WITH OR WITHOUT FEVER  NAUSEA AND VOMITING THAT IS NOT CONTROLLED WITH YOUR NAUSEA MEDICATION  *UNUSUAL SHORTNESS OF BREATH  *UNUSUAL BRUISING OR BLEEDING  TENDERNESS IN MOUTH AND THROAT WITH OR WITHOUT PRESENCE OF ULCERS  *URINARY PROBLEMS  *BOWEL PROBLEMS  UNUSUAL RASH Items with * indicate a potential emergency and should be followed up as soon as possible.  Feel free to call the clinic you have any questions or concerns. The clinic phone number is [phone].  

## 2014-08-01 ENCOUNTER — Ambulatory Visit (HOSPITAL_BASED_OUTPATIENT_CLINIC_OR_DEPARTMENT_OTHER): Payer: BLUE CROSS/BLUE SHIELD

## 2014-08-01 ENCOUNTER — Ambulatory Visit: Payer: BLUE CROSS/BLUE SHIELD

## 2014-08-01 DIAGNOSIS — C786 Secondary malignant neoplasm of retroperitoneum and peritoneum: Secondary | ICD-10-CM | POA: Diagnosis not present

## 2014-08-01 DIAGNOSIS — C169 Malignant neoplasm of stomach, unspecified: Secondary | ICD-10-CM

## 2014-08-01 DIAGNOSIS — C7800 Secondary malignant neoplasm of unspecified lung: Secondary | ICD-10-CM | POA: Diagnosis not present

## 2014-08-01 MED ORDER — HEPARIN SOD (PORK) LOCK FLUSH 100 UNIT/ML IV SOLN
500.0000 [IU] | Freq: Once | INTRAVENOUS | Status: AC | PRN
  Administered 2014-08-01: 500 [IU]
  Filled 2014-08-01: qty 5

## 2014-08-01 MED ORDER — SODIUM CHLORIDE 0.9 % IJ SOLN
10.0000 mL | INTRAMUSCULAR | Status: DC | PRN
  Administered 2014-08-01: 10 mL
  Filled 2014-08-01: qty 10

## 2014-08-01 NOTE — Patient Instructions (Signed)
Fluorouracil, 5FU; Diclofenac topical cream What is this medicine? FLUOROURACIL; DICLOFENAC (flure oh YOOR a sil; dye KLOE fen ak) is a combination of a topical chemotherapy agent and non-steroidal anti-inflammatory drug (NSAID). It is used on the skin to treat skin cancer and skin conditions that could become cancer. This medicine may be used for other purposes; ask your health care provider or pharmacist if you have questions. COMMON BRAND NAME(S): FLUORAC What should I tell my health care provider before I take this medicine? They need to know if you have any of these conditions: -bleeding problems -cigarette smoker -DPD enzyme deficiency -heart disease -high blood pressure -if you frequently drink alcohol containing drinks -kidney disease -liver disease -open or infected skin -stomach problems -swelling or open sores at the treatment site -recent or planned coronary artery bypass graft (CABG) surgery -an unusual or allergic reaction to fluorouracil, diclofenac, aspirin, other NSAIDs, other medicines, foods, dyes, or preservatives -pregnant or trying to get pregnant -breast-feeding How should I use this medicine? This medicine is only for use on the skin. Follow the directions on the prescription label. Wash hands before and after use. Wash affected area and gently pat dry. To apply this medicine use a cotton-tipped applicator, or use gloves if applying with fingertips. If applied with unprotected fingertips, it is very important to wash your hands well after you apply this medicine. Avoid applying to the eyes, nose, or mouth. Apply enough medicine to cover the affected area. You can cover the area with a light gauze dressing, but do not use tight or air-tight dressings. Finish the full course prescribed by your doctor or health care professional, even if you think your condition is better. Do not stop taking except on the advice of your doctor or health care professional. Talk to your  pediatrician regarding the use of this medicine in children. Special care may be needed. Overdosage: If you think you've taken too much of this medicine contact a poison control center or emergency room at once. Overdosage: If you think you have taken too much of this medicine contact a poison control center or emergency room at once. NOTE: This medicine is only for you. Do not share this medicine with others. What if I miss a dose? If you miss a dose, apply it as soon as you can. If it is almost time for your next dose, only use that dose. Do not apply extra doses. Contact your doctor or health care professional if you miss more than one dose. What may interact with this medicine? Interactions are not expected. Do not use any other skin products without telling your doctor or health care professional. This list may not describe all possible interactions. Give your health care provider a list of all the medicines, herbs, non-prescription drugs, or dietary supplements you use. Also tell them if you smoke, drink alcohol, or use illegal drugs. Some items may interact with your medicine. What should I watch for while using this medicine? Visit your doctor or health care professional for checks on your progress. You will need to use this medicine for 2 to 6 weeks. This may be longer depending on the condition being treated. You may not see full healing for another 1 to 2 months after you stop using the medicine. Treated areas of skin can look unsightly during and for several weeks after treatment with this medicine. This medicine can make you more sensitive to the sun. Keep out of the sun. If you cannot avoid being in   the sun, wear protective clothing and use sunscreen. Do not use sun lamps or tanning beds/booths. Where should I keep my What side effects may I notice from receiving this medicine? Side effects that you should report to your doctor or health care professional as soon as possible: -allergic  reactions like skin rash, itching or hives, swelling of the face, lips, or tongue -black or bloody stools, blood in the urine or vomit -blurred vision -chest pain -difficulty breathing or wheezing -redness, blistering, peeling or loosening of the skin, including inside the mouth -severe redness and swelling of normal skin -slurred speech or weakness on one side of the body -trouble passing urine or change in the amount of urine -unexplained weight gain or swelling -unusually weak or tired -yellowing of eyes or skin Side effects that usually do not require medical attention (Report these to your doctor or health care professional if they continue or are bothersome.): -increased sensitivity of the skin to sun and ultraviolet light -pain and burning of the affected area -scaling or swelling of the affected area -skin rash, itching of the affected area -tenderness This list may not describe all possible side effects. Call your doctor for medical advice about side effects. You may report side effects to FDA at 1-800-FDA-1088. Where should I keep my medicine? Keep out of the reach of children. Store at room temperature between 20 and 25 degrees C (68 and 77 degrees F). Throw away any unused medicine after the expiration date. NOTE: This sheet is a summary. It may not cover all possible information. If you have questions about this medicine, talk to your doctor, pharmacist, or health care provider.  2015, [employer] Standard. (2013-08-27 11:09:58)  

## 2014-08-12 ENCOUNTER — Telehealth: Payer: Self-pay | Admitting: *Deleted

## 2014-08-12 ENCOUNTER — Other Ambulatory Visit (HOSPITAL_BASED_OUTPATIENT_CLINIC_OR_DEPARTMENT_OTHER): Payer: BLUE CROSS/BLUE SHIELD

## 2014-08-12 DIAGNOSIS — C786 Secondary malignant neoplasm of retroperitoneum and peritoneum: Secondary | ICD-10-CM

## 2014-08-12 DIAGNOSIS — C169 Malignant neoplasm of stomach, unspecified: Secondary | ICD-10-CM

## 2014-08-12 DIAGNOSIS — C7800 Secondary malignant neoplasm of unspecified lung: Secondary | ICD-10-CM

## 2014-08-12 LAB — COMPREHENSIVE METABOLIC PANEL (CC13)
ALT: 13 U/L (ref 0–55)
AST: 20 U/L (ref 5–34)
Albumin: 2.8 g/dL — ABNORMAL LOW (ref 3.5–5.0)
Alkaline Phosphatase: 119 U/L (ref 40–150)
Anion Gap: 10 mEq/L (ref 3–11)
BUN: 16 mg/dL (ref 7.0–26.0)
CALCIUM: 8.8 mg/dL (ref 8.4–10.4)
CO2: 24 mEq/L (ref 22–29)
Chloride: 108 mEq/L (ref 98–109)
Creatinine: 0.8 mg/dL (ref 0.7–1.3)
EGFR: >90 mL/min/{1.73_m2} (ref >90)
GLUCOSE: 107 mg/dL (ref 70–140)
POTASSIUM: 3.9 meq/L (ref 3.5–5.1)
Sodium: 142 mEq/L (ref 136–145)
Total Bilirubin: 0.36 mg/dL (ref 0.20–1.20)
Total Protein: 6.2 g/dL — ABNORMAL LOW (ref 6.4–8.3)

## 2014-08-12 LAB — CBC WITH DIFFERENTIAL/PLATELET
BASO%: 1 % (ref 0.0–2.0)
Basophils Absolute: 0.1 10*3/uL (ref 0.0–0.1)
EOS ABS: 0.1 10*3/uL (ref 0.0–0.5)
EOS%: 2.4 % (ref 0.0–7.0)
HCT: 38.9 % (ref 38.4–49.9)
HEMOGLOBIN: 12.3 g/dL — AB (ref 13.0–17.1)
LYMPH%: 25.1 % (ref 14.0–49.0)
MCH: 27 pg — ABNORMAL LOW (ref 27.2–33.4)
MCHC: 31.6 g/dL — AB (ref 32.0–36.0)
MCV: 85.2 fL (ref 79.3–98.0)
MONO#: 1 10*3/uL — AB (ref 0.1–0.9)
MONO%: 18.9 % — AB (ref 0.0–14.0)
NEUT#: 2.7 10*3/uL (ref 1.5–6.5)
NEUT%: 52.6 % (ref 39.0–75.0)
PLATELETS: 147 10*3/uL (ref 140–400)
RBC: 4.56 10*6/uL (ref 4.20–5.82)
RDW: 22.4 % — AB (ref 11.0–14.6)
WBC: 5.2 10*3/uL (ref 4.0–10.3)
lymph#: 1.3 10*3/uL (ref 0.9–3.3)

## 2014-08-12 NOTE — Telephone Encounter (Signed)
Called pt & informed that labs OK & OK for treatment tomorrow.  Reminded of appt times.

## 2014-08-13 ENCOUNTER — Ambulatory Visit (HOSPITAL_BASED_OUTPATIENT_CLINIC_OR_DEPARTMENT_OTHER): Payer: BLUE CROSS/BLUE SHIELD | Admitting: Hematology

## 2014-08-13 ENCOUNTER — Other Ambulatory Visit: Payer: BLUE CROSS/BLUE SHIELD

## 2014-08-13 ENCOUNTER — Ambulatory Visit (HOSPITAL_BASED_OUTPATIENT_CLINIC_OR_DEPARTMENT_OTHER): Payer: BLUE CROSS/BLUE SHIELD

## 2014-08-13 ENCOUNTER — Encounter: Payer: Self-pay | Admitting: Hematology

## 2014-08-13 VITALS — BP 135/72 | HR 65 | Temp 97.6°F | Resp 19 | Ht 69.0 in | Wt 279.4 lb

## 2014-08-13 DIAGNOSIS — R180 Malignant ascites: Secondary | ICD-10-CM | POA: Diagnosis not present

## 2014-08-13 DIAGNOSIS — C169 Malignant neoplasm of stomach, unspecified: Secondary | ICD-10-CM | POA: Diagnosis not present

## 2014-08-13 DIAGNOSIS — C786 Secondary malignant neoplasm of retroperitoneum and peritoneum: Secondary | ICD-10-CM

## 2014-08-13 DIAGNOSIS — D630 Anemia in neoplastic disease: Secondary | ICD-10-CM

## 2014-08-13 DIAGNOSIS — R110 Nausea: Secondary | ICD-10-CM

## 2014-08-13 DIAGNOSIS — Z5111 Encounter for antineoplastic chemotherapy: Secondary | ICD-10-CM

## 2014-08-13 DIAGNOSIS — C7800 Secondary malignant neoplasm of unspecified lung: Secondary | ICD-10-CM | POA: Diagnosis not present

## 2014-08-13 DIAGNOSIS — E46 Unspecified protein-calorie malnutrition: Secondary | ICD-10-CM

## 2014-08-13 DIAGNOSIS — D759 Disease of blood and blood-forming organs, unspecified: Secondary | ICD-10-CM

## 2014-08-13 DIAGNOSIS — D6481 Anemia due to antineoplastic chemotherapy: Secondary | ICD-10-CM

## 2014-08-13 DIAGNOSIS — D509 Iron deficiency anemia, unspecified: Secondary | ICD-10-CM

## 2014-08-13 DIAGNOSIS — R630 Anorexia: Secondary | ICD-10-CM

## 2014-08-13 MED ORDER — SODIUM CHLORIDE 0.9 % IV SOLN
Freq: Once | INTRAVENOUS | Status: AC
  Administered 2014-08-13: 11:00:00 via INTRAVENOUS
  Filled 2014-08-13: qty 4

## 2014-08-13 MED ORDER — OXALIPLATIN CHEMO INJECTION 100 MG/20ML
80.0000 mg/m2 | Freq: Once | INTRAVENOUS | Status: AC
  Administered 2014-08-13: 200 mg via INTRAVENOUS
  Filled 2014-08-13: qty 40

## 2014-08-13 MED ORDER — LEUCOVORIN CALCIUM INJECTION 350 MG
400.0000 mg/m2 | Freq: Once | INTRAVENOUS | Status: AC
  Administered 2014-08-13: 996 mg via INTRAVENOUS
  Filled 2014-08-13: qty 49.8

## 2014-08-13 MED ORDER — SODIUM CHLORIDE 0.9 % IV SOLN
2400.0000 mg/m2 | INTRAVENOUS | Status: DC
  Administered 2014-08-13: 6000 mg via INTRAVENOUS
  Filled 2014-08-13: qty 120

## 2014-08-13 MED ORDER — DEXTROSE 5 % IV SOLN
Freq: Once | INTRAVENOUS | Status: AC
  Administered 2014-08-13: 11:00:00 via INTRAVENOUS

## 2014-08-13 MED ORDER — PROCHLORPERAZINE MALEATE 10 MG PO TABS: 10.0000 mg | ORAL_TABLET | Freq: Four times a day (QID) | ORAL | Status: DC | PRN

## 2014-08-13 NOTE — Progress Notes (Signed)
[place]  Telephone:([phone]) [phone] Fax:([phone]) [place] Note   No care team member to display 08/13/2014  CHIEF COMPLAINTS Follow up metastatic gastric cancer    Gastric adenocarcinoma: Stage IV   04/23/2014 Imaging CT abdomen showed peritoneal carcinomatosis, ascites, thickning of gastric and GEJ wall, CT chest showed a few small b/l lung nodules.    04/24/2014 Tumor Marker CEA  5.6, CA19.9 13   04/25/2014 Initial Diagnosis Gastric adenocarcinoma: Stage IV   04/25/2014 Pathology Results Stomach mass biopsy showed - ADENOCARCINOMA WITH SIGNET RING FEATURES. ascites cytology (+)   04/25/2014 Procedure EGD: There was a large, nearly circumferential, ulcerated, clearly malignant mass that stradles the GE junction. The vast bulk of the mass lays in the stomach.    05/07/2014 -  Chemotherapy mFOLFOX, with neulasta as needed, bolus 5FU omitted from cycle 6 due to cytopenia     06/03/2014 Miscellaneous [employer] [facility] genetic test (+) for:  NF1 splice site [id number] del 47 TP53 R213*    CURRENT THERAPY: mFOLFOX6 started on 05/08/2015  HISTORY OF PRESENTING ILLNESS:  Samuel Durham 39 y.o. 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 I saw him first when he was recently admitted to [facility]. He is here for the first office visit after hospital discharge.  He was  admitted on 12/15 with progressive, 4 month history of dysphagia, epigastric abdominal discomfort and bloating, early satiation accompanied by nausea, and postprandial vomiting and diarrhea. He has experienced a 30 lbs weight loss in the process. He has noted increased abdominal girth. Patient denies any shortness of breath, chest pain, GI bleed, hematuria or lower extremity swelling. He denies heavy alcohol intake. Denies tobacco abuse. Denies risk factors for HIV or hepatitis. He denies any family history of GI malignancies.  CT of the abdomen and pelvis with  contrast on 12/15 revealed several pulmonary nodules at the bases bilaterally, profound thickening of the distal esophagus extending beyond the gastroesophageal junction into the proximal stomach,cardia and fundus of the stomach, most evident along the lesser curvature. No pathologic dilatation of small bowel or colon. Extensive upper abdominal and retroperitoneal lymphadenopathy was visualized. Moderate volume of ascites, presumably malignant, was noted. Extensive soft tissue nodularity throughout the omentum, compatible with omental caking and widespread peritoneal metastasis. Additionally, there are other areas of enhancing nodularity along the peritoneal surface. Tiny ventral and umbilical hernias containing predominantly omental fat, including some omental implants. There are no aggressive appearing lytic or blastic lesions noted in the visualized portions of the skeleton.  He underwent an EGD by Dr. Ardis Hughs on 04/25/2014, which showed a large fungating mass that straddles the GE junction and occupying the upper 1/3 part of the stomach. Multiple biopsy was taken which showed adenocarcinoma with signet ring features. He also underwent a paracentesis with 4.5 L fluid is removed on 04/26/2014. Cytology was positive for adenocarcinoma with signet ring features, consistent with metastatic disease.  He was discharged to home on 04/27/2014. He did feel a slightly informant of his abdominal bloating after the paracentesis, but not much improvement other symptoms. He had Mediport placement and the second paracentesis was again 4 L fluids removal on 05/02/2014. His appetite is low, he drinks ensure 3 bottles a day, some soup and a very little other foods. His energy level is also low, but able to take care of all his daily needs, and move around with about much difficulty. His abdominal discomfort/pain are relatively controlled by morphine 15 mg  at night and tramadol 2-3 times during the day. He denies any fever,  chills, cough, chest pain or any in the other discomfort.  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 He has been doing well overall and tolerating the chemo well. He denies any significant abdominal pain or bloating. He is eating well, his energy level is fairly good  and he remains physically active. He denies any fever or chills. His weight is stable.  (MEDICAL HISTORY:  Past Medical History  Diagnosis Date  . Medical history non-contributory     SURGICAL HISTORY: Past Surgical History  Procedure Laterality Date  . Squamous cell carcinoma excision  approx March 2015    removed from top of head  . Esophagogastroduodenoscopy (egd) with propofol N/A 04/25/2014    Procedure: ESOPHAGOGASTRODUODENOSCOPY (EGD) WITH PROPOFOL;  Surgeon: Milus Banister, MD;  Location: [facility];  Service: Endoscopy;  Laterality: N/A;    SOCIAL HISTORY: History   Social History  . Marital Status: Single    Spouse Name: N/A  . Number of Children: N/A  . Years of Education: N/A   Occupational History  . Not on file.   Social History Main Topics  . Smoking status: Never Smoker   . Smokeless tobacco: Never Used  . Alcohol Use: No  . Drug Use: No  . Sexual Activity: Not on file   Other Topics Concern  . Not on file   Social History Narrative    FAMILY HISTORY: Family History  Problem Relation Age of Onset  . Breast cancer Sister 13   . Hypertension Mother   . Hypertension Father   Maternal aunt breast cancer at age of 95 Maternal cousin ovarian cancer at age of 49 Maternal ancle prostate cancer age of 39   ALLERGIES:  has No Known Allergies.  MEDICATIONS:  Current Outpatient Prescriptions  Medication Sig Dispense Refill  . docusate sodium 100 MG CAPS Take 100 mg by mouth 2 (two) times daily. 10 capsule 0  . lidocaine-prilocaine (EMLA) cream Apply 1 application  topically as needed. 30 g 0  . mirtazapine (REMERON) 15 MG tablet Take 1 tablet (15 mg total) by mouth at bedtime. 30 tablet 1  . morphine (MS CONTIN) 15 MG 12 hr tablet Take 1 tablet (15 mg total) by mouth every 12 (twelve) hours. 60 tablet 0  . ondansetron (ZOFRAN) 4 MG tablet Take 1 tablet (4 mg total) by mouth every 8 (eight) hours as needed for nausea or vomiting. 20 tablet 0  . pantoprazole (PROTONIX) 40 MG tablet Take 1 tablet (40 mg total) by mouth daily. 30 tablet 0  . prochlorperazine (COMPAZINE) 10 MG tablet Take 1 tablet (10 mg total) by mouth every 6 (six) hours as needed for nausea or vomiting. 30 tablet 3  . traMADol (ULTRAM) 50 MG tablet Take 1-2 tablets (50-100 mg total) by mouth every 6 (six) hours as needed for moderate pain. 30 tablet 0   No current facility-administered medications for this visit.    REVIEW OF SYSTEMS:   Constitutional: Denies fevers, chills or abnormal night sweats, (+) weight loss and anorexia Eyes: Denies blurriness of vision, double vision or watery eyes Ears, nose, mouth, throat, and face: Denies mucositis or sore throat Respiratory: Denies cough, dyspnea or wheezes Cardiovascular: Denies palpitation, chest discomfort or lower extremity swelling Gastrointestinal:  Less nausea, abdominal bloating and discomfort Skin: Denies abnormal skin rashes Lymphatics:  Denies new lymphadenopathy or easy bruising Neurological:Denies numbness, tingling or new weaknesses Behavioral/Psych: Mood is stable, no new changes  All other systems were reviewed with the patient and are negative except those mentioned in the history.  PHYSICAL EXAMINATION: ECOG PERFORMANCE STATUS: 1  GENERAL:alert, no distress and comfortable SKIN: skin color, texture, turgor are normal, no rashes or significant lesions EYES: normal, conjunctiva are pink and non-injected, sclera clear OROPHARYNX:no exudate, no erythema and lips, buccal mucosa, and tongue normal  NECK: supple, thyroid  normal size, non-tender, without nodularity LYMPH:  no palpable lymphadenopathy in the cervical, axillary or inguinal LUNGS: clear to auscultation and percussion with normal breathing effort HEART: regular rate & rhythm and no murmurs and no lower extremity edema ABDOMEN:abdomen soft, non-tender and normal bowel sounds, probable small amount of ascites. Musculoskeletal:no cyanosis of digits and no clubbing  PSYCH: alert & oriented x 3 with fluent speech NEURO: no focal motor/sensory deficits  LABORATORY DATA:  I have reviewed the data as listed Lab Results  Component Value Date   WBC 5.2 08/12/2014   HGB 12.3* 08/12/2014   HCT 38.9 08/12/2014   MCV 85.2 08/12/2014   PLT 147 08/12/2014    Recent Labs  04/25/14 0434 04/26/14 0810 04/27/14 0453  07/16/14 0938 07/29/14 0851 08/12/14 0836  NA 137 135* 139  < > 141 142 142  K 4.2 3.9 4.2  < > 4.0 4.1 3.9  CL 99 98 100  --   --   --   --   CO2 _0 < > _1 GLUCOSE 88 144* 113*  < > 112 96 107  BUN _2 < > 15.4 15.1 16.0  CREATININE 0.98 0.90 0.92  < > 0.8 0.8 0.8  CALCIUM 8.7 8.8 8.7  < > 9.0 8.8 8.8  GFRNONAA >90 >90 >90  --   --   --   --   GFRAA >90 >90 >90  --   --   --   --   PROT 6.3  --  6.0  < > 6.2* 6.3* 6.2*  ALBUMIN 2.3*  --  2.1*  < > 2.9* 2.9* 2.8*  AST 17  --  18  < > _3 ALT 10  --  9  < > _4 ALKPHOS 82  --  101  < > 108 128 119  BILITOT 0.5  --  0.5  < > 0.38 0.35 0.36  < > = values in this interval not displayed. Pathology report  Diagnosis  PERITONEAL/ASCITIC FLUID(SPECIMEN 1 OF 1 COLLECTED 04/26/14): METASTATIC ADENOCARCINOMA WITH SIGNET RING CELL FEATURES.  Stomach, biopsy, r/o cancer 04/25/14 - ADENOCARCINOMA WITH SIGNET RING FEATURES. Microscopic Comment The results were called to Dr. Ardis Hughs on 04/26/2014. (JDP:kh 04/26/14) Claudette Laws MD Pathologist, Electronic Signature (Case signed 04/26/2014)  HER-2/NEU BY CISH - NO AMPLIFICATION OF HER-2  DETECTED. RESULT RATIO OF HER2: CEP 17 SIGNALS 1.20 AVERAGE HER2 COPY NUMBER PER CELL 2.10  ADDITIONAL INFORMATION: Mismatch Repair (MMR) Protein Immunohistochemistry (IHC) IHC Expression Result: MLH1: Preserved nuclear expression (greater 50% tumor expression) MSH2: Preserved nuclear expression (greater 50% tumor expression) MSH6: Preserved nuclear expression (greater 50% tumor expression) PMS2: Preserved nuclear expression (greater 50% tumor expression) * Internal control demonstrates intact nuclear expression Interpretation: NORMAL There is preserved expression of the major and minor MMR proteins. There is a very low probability that microsatellite instability (MSI) is present. However, certain clinically significant MMR protein mutations may  result in preservation of nuclear expression. It is recommended that the preservation of protein expression be correlated with molecular based MSI testing.  [facility] [employer] testing: (+) YO37 and NF1   RADIOGRAPHIC STUDIES: I have personally reviewed the radiological images as listed and agreed with the findings in the report.  Ct Chest, abdomen and pelvis Wo Contrast12/24/2015    IMPRESSION:  1. Findings, as above, highly concerning for either esophageal or gastric primary malignancy, with metastatic lymphadenopathy in the upper abdomen and retroperitoneum, widespread intraperitoneal spread of disease, malignant ascites, and multiple pulmonary nodules in the visualize lung bases concerning for pulmonary metastasis. Oncologic evaluation is recommended.  2. Additional incidental findings, as above. These results were called by telephone at the time of interpretation on 04/23/2014 at 7:53 pm to [place] , who verbally acknowledged these results.   Electronically Signed   By: Vinnie Langton M.D.   On: 04/23/2014 19:56   CT CAP 06/24/2014 IMPRESSION: 1. Progressive low density and calcifications within the extensive upper abdominal  lymphadenopathy, likely related to partial treatment. Some of the lymph nodes do appear slightly larger. The irregular infiltrating mass involving the gastric cardia appears slightly improved. 2. No evidence of hepatic metastatic disease. 3. Overall volume of ascites has improved, although extensive changes of peritoneal carcinomatosis have not significantly changed. 4. No significant change in bilateral pulmonary nodules consistent with metastatic disease. 5. Resolution of bilateral pleural effusions.   EGD WITH BIOPSY 04/25/2014 ENDOSCOPIC IMPRESSION: There was a large, nearly circumferential, ulcerated, clearly malignant mass that stradles the GE junction. The vast bulk of the mass lays in the stomach, occupying about 1/3 of the stomach. The mass extends into the esophagus for about 4cm above the GE junction. Mutiple biopsies were taken from the gastric portion of the mass   I ASSESSMENT & PLAN:  39 year old gentleman without significant past medical history presents with abdominal bloating, nausea, vomiting, diarrhea, anemia and weight loss. EGD showed a large mass arising from the gastric fundus and extending into GE junction, biopsy showed adenocarcinoma with signet ring features, CT scan is consistent with peritoneal carcinomatosis and bilateral lung metastasis. Ascites cytology was positive for malignant cells.  1. 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 HER-2 negative, MMR-normal -I discussed his surgical biopsy results, cytology results, skin findings with patient and his family members, including his parents and 2 sisters. Unfortunately, this is widely metastatic, not curable, and overall prognosis is very poor. Literature has showed gastric adenocarcinoma with signet ring features has overall worse outcome comparing to others. His life expectancy is probably less than 6 months without treatment. -He has started first-line  chemotherapy FOLFOX, tolerated well, and his symptoms have much  improved. -His tumor is negative for HER-2 overexpression, no role for trastuzumab. -We also checked his tumor for MMR, which was normal -[facility] tonight test reviewed no actionable mutation -He had [facility] one test on his tumor, which was requested by his oncologist Dr. Rushie Nyhan at [facility], he will bring the report to me next time -His recent restaging CT scan showed mild partial response, overall stable disease. His ascites has significantly decreased. Pleural effusion resolved. He is clinically doing well. I recommend to continue the current FOLFOX chemotherapy. -he is interested in pursuing [facility] in [place], which is a intravenous infusion for total of 2 weeks. I'm okay with it, he will probably need a chemotherapy break during that time. He will likely do it after 6 months  of chemotherapy. -His lab is adequate for treatment, we'll proceed to cycle 8 today. 2. Cytopenia, secondary to chemotherapy -His ANC 3.4, platelet 112 today -Okay to proceed with chemotherapy but will skip the 5-FU bolus -Lab is adequate for treatment, we'll proceed his cycle 8 chemotherapy today  2. Malignant ascites -Much improved since he started chemotherapy. -Paracentesis as needed in the future.  3. Anorexia, nausea, malnutrition -Those are all related to his cancer, his symptom has much improved since he started chemotherapy. -cont mirtazapine 15 mg at bedtime for his anorexia. -Continue Zofran and Compazine for nausea as needed -I encouraged him to continue nutrition supplement, dietitian consult  4. Anemia, multifactorial (iron deficient anemia, cancer related and chemotherapy induced ) -His ferritin level was normal at 171, but he has low iron and saturation, likely some degree of iron deficient anemia. -He has received IV Feraheme 510 mg twice, 2 weeks apart -Blood transfusion as needed during chemotherapy if her  hemoglobin below 8. -Mild anemia has been stable.    Plan #1 cycle 8 FOLFOX today,  No 5-FU bolus, no Neulasta on day 3  #2 RTC in 2 weeks for cycle 9, with a restaging CT scan the day before   Patient and his family members had many questions. All questions were answered. The patient knows to call the clinic with any problems, questions or concerns.  I spent 20 minutes counseling the patient face to face. The total time spent in the appointment was 25  minutes and more than 50% was on counseling.     Truitt Merle, MD 08/13/2014

## 2014-08-13 NOTE — Patient Instructions (Signed)
[facility] Discharge Instructions for Patients Receiving Chemotherapy  Today you received the following chemotherapy agents oxaliplatin/leucovorin/fluorouracil  To help prevent nausea and vomiting after your treatment, we encourage you to take your nausea medication as directed If you develop nausea and vomiting that is not controlled by your nausea medication, call the clinic.   BELOW ARE SYMPTOMS THAT SHOULD BE REPORTED IMMEDIATELY:  *FEVER GREATER THAN 100.5 F  *CHILLS WITH OR WITHOUT FEVER  NAUSEA AND VOMITING THAT IS NOT CONTROLLED WITH YOUR NAUSEA MEDICATION  *UNUSUAL SHORTNESS OF BREATH  *UNUSUAL BRUISING OR BLEEDING  TENDERNESS IN MOUTH AND THROAT WITH OR WITHOUT PRESENCE OF ULCERS  *URINARY PROBLEMS  *BOWEL PROBLEMS  UNUSUAL RASH Items with * indicate a potential emergency and should be followed up as soon as possible.  Feel free to call the clinic you have any questions or concerns. The clinic phone number is [phone].  

## 2014-08-15 ENCOUNTER — Ambulatory Visit (HOSPITAL_BASED_OUTPATIENT_CLINIC_OR_DEPARTMENT_OTHER): Payer: BLUE CROSS/BLUE SHIELD

## 2014-08-15 DIAGNOSIS — C7800 Secondary malignant neoplasm of unspecified lung: Secondary | ICD-10-CM | POA: Diagnosis not present

## 2014-08-15 DIAGNOSIS — Z5189 Encounter for other specified aftercare: Secondary | ICD-10-CM

## 2014-08-15 DIAGNOSIS — C169 Malignant neoplasm of stomach, unspecified: Secondary | ICD-10-CM

## 2014-08-15 DIAGNOSIS — C786 Secondary malignant neoplasm of retroperitoneum and peritoneum: Secondary | ICD-10-CM | POA: Diagnosis not present

## 2014-08-15 MED ORDER — SODIUM CHLORIDE 0.9 % IJ SOLN
10.0000 mL | INTRAMUSCULAR | Status: DC | PRN
  Administered 2014-08-15: 10 mL
  Filled 2014-08-15: qty 10

## 2014-08-15 MED ORDER — HEPARIN SOD (PORK) LOCK FLUSH 100 UNIT/ML IV SOLN
500.0000 [IU] | Freq: Once | INTRAVENOUS | Status: AC | PRN
  Administered 2014-08-15: 500 [IU]
  Filled 2014-08-15: qty 5

## 2014-08-15 MED ORDER — PEGFILGRASTIM INJECTION 6 MG/0.6ML ~~LOC~~
6.0000 mg | PREFILLED_SYRINGE | Freq: Once | SUBCUTANEOUS | Status: AC
  Administered 2014-08-15: 6 mg via SUBCUTANEOUS
  Filled 2014-08-15: qty 0.6

## 2014-08-15 NOTE — Patient Instructions (Signed)
Pegfilgrastim injection What is this medicine? PEGFILGRASTIM (peg fil GRA stim) is a long-acting granulocyte colony-stimulating factor that stimulates the growth of neutrophils, a type of white blood cell important in the body's fight against infection. It is used to reduce the incidence of fever and infection in patients with certain types of cancer who are receiving chemotherapy that affects the bone marrow. This medicine may be used for other purposes; ask your health care provider or pharmacist if you have questions. COMMON BRAND NAME(S): Neulasta What should I tell my health care provider before I take this medicine? They need to know if you have any of these conditions: -latex allergy -ongoing radiation therapy -sickle cell disease -skin reactions to acrylic adhesives (On-Body Injector only) -an unusual or allergic reaction to pegfilgrastim, filgrastim, other medicines, foods, dyes, or preservatives -pregnant or trying to get pregnant -breast-feeding How should I use this medicine? This medicine is for injection under the skin. If you get this medicine at home, you will be taught how to prepare and give the pre-filled syringe or how to use the On-body Injector. Refer to the patient Instructions for Use for detailed instructions. Use exactly as directed. Take your medicine at regular intervals. Do not take your medicine more often than directed. It is important that you put your used needles and syringes in a special sharps container. Do not put them in a trash can. If you do not have a sharps container, call your pharmacist or healthcare provider to get one. Talk to your pediatrician regarding the use of this medicine in children. Special care may be needed. Overdosage: If you think you have taken too much of this medicine contact a poison control center or emergency room at once. NOTE: This medicine is only for you. Do not share this medicine with others. What if I miss a dose? It is  important not to miss your dose. Call your doctor or health care professional if you miss your dose. If you miss a dose due to an On-body Injector failure or leakage, a new dose should be administered as soon as possible using a single prefilled syringe for manual use. What may interact with this medicine? Interactions have not been studied. Give your health care provider a list of all the medicines, herbs, non-prescription drugs, or dietary supplements you use. Also tell them if you smoke, drink alcohol, or use illegal drugs. Some items may interact with your medicine. This list may not describe all possible interactions. Give your health care provider a list of all the medicines, herbs, non-prescription drugs, or dietary supplements you use. Also tell them if you smoke, drink alcohol, or use illegal drugs. Some items may interact with your medicine. What should I watch for while using this medicine? You may need blood work done while you are taking this medicine. If you are going to need a MRI, CT scan, or other procedure, tell your doctor that you are using this medicine (On-Body Injector only). What side effects may I notice from receiving this medicine? Side effects that you should report to your doctor or health care professional as soon as possible: -allergic reactions like skin rash, itching or hives, swelling of the face, lips, or tongue -dizziness -fever -pain, redness, or irritation at site where injected -pinpoint red spots on the skin -shortness of breath or breathing problems -stomach or side pain, or pain at the shoulder -swelling -tiredness -trouble passing urine Side effects that usually do not require medical attention (report to your doctor   or health care professional if they continue or are bothersome): -bone pain -muscle pain This list may not describe all possible side effects. Call your doctor for medical advice about side effects. You may report side effects to FDA at  1-800-FDA-1088. Where should I keep my medicine? Keep out of the reach of children. Store pre-filled syringes in a refrigerator between 2 and 8 degrees C (36 and 46 degrees F). Do not freeze. Keep in carton to protect from light. Throw away this medicine if it is left out of the refrigerator for more than 48 hours. Throw away any unused medicine after the expiration date. NOTE: This sheet is a summary. It may not cover all possible information. If you have questions about this medicine, talk to your doctor, pharmacist, or health care provider.  2015, [employer] Standard. (2013-07-26 16:14:05) Fluorouracil, 5FU; Diclofenac topical cream What is this medicine? FLUOROURACIL; DICLOFENAC (flure oh YOOR a sil; dye KLOE fen ak) is a combination of a topical chemotherapy agent and non-steroidal anti-inflammatory drug (NSAID). It is used on the skin to treat skin cancer and skin conditions that could become cancer. This medicine may be used for other purposes; ask your health care provider or pharmacist if you have questions. COMMON BRAND NAME(S): FLUORAC What should I tell my health care provider before I take this medicine? They need to know if you have any of these conditions: -bleeding problems -cigarette smoker -DPD enzyme deficiency -heart disease -high blood pressure -if you frequently drink alcohol containing drinks -kidney disease -liver disease -open or infected skin -stomach problems -swelling or open sores at the treatment site -recent or planned coronary artery bypass graft (CABG) surgery -an unusual or allergic reaction to fluorouracil, diclofenac, aspirin, other NSAIDs, other medicines, foods, dyes, or preservatives -pregnant or trying to get pregnant -breast-feeding How should I use this medicine? This medicine is only for use on the skin. Follow the directions on the prescription label. Wash hands before and after use. Wash affected area and gently pat dry. To apply this  medicine use a cotton-tipped applicator, or use gloves if applying with fingertips. If applied with unprotected fingertips, it is very important to wash your hands well after you apply this medicine. Avoid applying to the eyes, nose, or mouth. Apply enough medicine to cover the affected area. You can cover the area with a light gauze dressing, but do not use tight or air-tight dressings. Finish the full course prescribed by your doctor or health care professional, even if you think your condition is better. Do not stop taking except on the advice of your doctor or health care professional. Talk to your pediatrician regarding the use of this medicine in children. Special care may be needed. Overdosage: If you think you've taken too much of this medicine contact a poison control center or emergency room at once. Overdosage: If you think you have taken too much of this medicine contact a poison control center or emergency room at once. NOTE: This medicine is only for you. Do not share this medicine with others. What if I miss a dose? If you miss a dose, apply it as soon as you can. If it is almost time for your next dose, only use that dose. Do not apply extra doses. Contact your doctor or health care professional if you miss more than one dose. What may interact with this medicine? Interactions are not expected. Do not use any other skin products without telling your doctor or health care professional. This list   may not describe all possible interactions. Give your health care provider a list of all the medicines, herbs, non-prescription drugs, or dietary supplements you use. Also tell them if you smoke, drink alcohol, or use illegal drugs. Some items may interact with your medicine. What should I watch for while using this medicine? Visit your doctor or health care professional for checks on your progress. You will need to use this medicine for 2 to 6 weeks. This may be longer depending on the condition  being treated. You may not see full healing for another 1 to 2 months after you stop using the medicine. Treated areas of skin can look unsightly during and for several weeks after treatment with this medicine. This medicine can make you more sensitive to the sun. Keep out of the sun. If you cannot avoid being in the sun, wear protective clothing and use sunscreen. Do not use sun lamps or tanning beds/booths. Where should I keep my What side effects may I notice from receiving this medicine? Side effects that you should report to your doctor or health care professional as soon as possible: -allergic reactions like skin rash, itching or hives, swelling of the face, lips, or tongue -black or bloody stools, blood in the urine or vomit -blurred vision -chest pain -difficulty breathing or wheezing -redness, blistering, peeling or loosening of the skin, including inside the mouth -severe redness and swelling of normal skin -slurred speech or weakness on one side of the body -trouble passing urine or change in the amount of urine -unexplained weight gain or swelling -unusually weak or tired -yellowing of eyes or skin Side effects that usually do not require medical attention (Report these to your doctor or health care professional if they continue or are bothersome.): -increased sensitivity of the skin to sun and ultraviolet light -pain and burning of the affected area -scaling or swelling of the affected area -skin rash, itching of the affected area -tenderness This list may not describe all possible side effects. Call your doctor for medical advice about side effects. You may report side effects to FDA at 1-800-FDA-1088. Where should I keep my medicine? Keep out of the reach of children. Store at room temperature between 20 and 25 degrees C (68 and 77 degrees F). Throw away any unused medicine after the expiration date. NOTE: This sheet is a summary. It may not cover all possible information.  If you have questions about this medicine, talk to your doctor, pharmacist, or health care provider.  2015, [employer]. (2013-08-27 11:09:58)  

## 2014-08-26 ENCOUNTER — Ambulatory Visit (HOSPITAL_COMMUNITY)
Admission: RE | Admit: 2014-08-26 | Discharge: 2014-08-26 | Disposition: A | Discharge status: Home / Self Care | Payer: BLUE CROSS/BLUE SHIELD | Source: Ambulatory Visit | Attending: Hematology | Admitting: Hematology

## 2014-08-26 ENCOUNTER — Other Ambulatory Visit (HOSPITAL_BASED_OUTPATIENT_CLINIC_OR_DEPARTMENT_OTHER): Payer: BLUE CROSS/BLUE SHIELD

## 2014-08-26 ENCOUNTER — Encounter (HOSPITAL_COMMUNITY): Payer: Self-pay

## 2014-08-26 DIAGNOSIS — C169 Malignant neoplasm of stomach, unspecified: Secondary | ICD-10-CM

## 2014-08-26 DIAGNOSIS — C7800 Secondary malignant neoplasm of unspecified lung: Secondary | ICD-10-CM | POA: Diagnosis not present

## 2014-08-26 DIAGNOSIS — C786 Secondary malignant neoplasm of retroperitoneum and peritoneum: Secondary | ICD-10-CM | POA: Diagnosis not present

## 2014-08-26 HISTORY — DX: Malignant (primary) neoplasm, unspecified: C80.1

## 2014-08-26 LAB — COMPREHENSIVE METABOLIC PANEL (CC13)
ALT: 14 U/L (ref 0–55)
ANION GAP: 10 meq/L (ref 3–11)
AST: 18 U/L (ref 5–34)
Albumin: 3 g/dL — ABNORMAL LOW (ref 3.5–5.0)
Alkaline Phosphatase: 169 U/L — ABNORMAL HIGH (ref 40–150)
BUN: 13.6 mg/dL (ref 7.0–26.0)
CHLORIDE: 108 meq/L (ref 98–109)
CO2: 23 mEq/L (ref 22–29)
Calcium: 8.8 mg/dL (ref 8.4–10.4)
Creatinine: 0.8 mg/dL (ref 0.7–1.3)
GLUCOSE: 107 mg/dL (ref 70–140)
Potassium: 3.9 mEq/L (ref 3.5–5.1)
SODIUM: 141 meq/L (ref 136–145)
TOTAL PROTEIN: 6.7 g/dL (ref 6.4–8.3)
Total Bilirubin: 0.35 mg/dL (ref 0.20–1.20)

## 2014-08-26 LAB — CBC WITH DIFFERENTIAL/PLATELET
BASO%: 1 % (ref 0.0–2.0)
BASOS ABS: 0.1 10*3/uL (ref 0.0–0.1)
EOS ABS: 0.1 10*3/uL (ref 0.0–0.5)
EOS%: 1.5 % (ref 0.0–7.0)
HCT: 40.1 % (ref 38.4–49.9)
HGB: 12.7 g/dL — ABNORMAL LOW (ref 13.0–17.1)
LYMPH#: 1.6 10*3/uL (ref 0.9–3.3)
LYMPH%: 22.4 % (ref 14.0–49.0)
MCH: 27.3 pg (ref 27.2–33.4)
MCHC: 31.7 g/dL — ABNORMAL LOW (ref 32.0–36.0)
MCV: 85.9 fL (ref 79.3–98.0)
MONO#: 0.8 10*3/uL (ref 0.1–0.9)
MONO%: 11.8 % (ref 0.0–14.0)
NEUT%: 63.3 % (ref 39.0–75.0)
NEUTROS ABS: 4.5 10*3/uL (ref 1.5–6.5)
PLATELETS: 104 10*3/uL — AB (ref 140–400)
RBC: 4.67 10*6/uL (ref 4.20–5.82)
RDW: 19.9 % — AB (ref 11.0–14.6)
WBC: 7.1 10*3/uL (ref 4.0–10.3)

## 2014-08-26 LAB — CEA: CEA: 1.7 ng/mL (ref 0.0–5.0)

## 2014-08-26 MED ORDER — IOHEXOL 300 MG/ML  SOLN
125.0000 mL | Freq: Once | INTRAMUSCULAR | Status: AC | PRN
  Administered 2014-08-26: 125 mL via INTRAVENOUS

## 2014-08-27 ENCOUNTER — Other Ambulatory Visit: Payer: BLUE CROSS/BLUE SHIELD

## 2014-08-27 ENCOUNTER — Ambulatory Visit (HOSPITAL_BASED_OUTPATIENT_CLINIC_OR_DEPARTMENT_OTHER): Payer: BLUE CROSS/BLUE SHIELD

## 2014-08-27 ENCOUNTER — Ambulatory Visit (HOSPITAL_BASED_OUTPATIENT_CLINIC_OR_DEPARTMENT_OTHER): Payer: BLUE CROSS/BLUE SHIELD | Admitting: Hematology

## 2014-08-27 ENCOUNTER — Encounter: Payer: Self-pay | Admitting: Hematology

## 2014-08-27 ENCOUNTER — Telehealth: Payer: Self-pay | Admitting: Hematology

## 2014-08-27 ENCOUNTER — Telehealth: Payer: Self-pay | Admitting: *Deleted

## 2014-08-27 ENCOUNTER — Ambulatory Visit: Payer: BLUE CROSS/BLUE SHIELD | Admitting: Nutrition

## 2014-08-27 VITALS — BP 134/79 | HR 63 | Temp 97.8°F | Resp 19 | Ht 69.0 in | Wt 279.2 lb

## 2014-08-27 DIAGNOSIS — D509 Iron deficiency anemia, unspecified: Secondary | ICD-10-CM

## 2014-08-27 DIAGNOSIS — D630 Anemia in neoplastic disease: Secondary | ICD-10-CM

## 2014-08-27 DIAGNOSIS — E46 Unspecified protein-calorie malnutrition: Secondary | ICD-10-CM

## 2014-08-27 DIAGNOSIS — R630 Anorexia: Secondary | ICD-10-CM

## 2014-08-27 DIAGNOSIS — Z803 Family history of malignant neoplasm of breast: Secondary | ICD-10-CM

## 2014-08-27 DIAGNOSIS — C786 Secondary malignant neoplasm of retroperitoneum and peritoneum: Secondary | ICD-10-CM

## 2014-08-27 DIAGNOSIS — C169 Malignant neoplasm of stomach, unspecified: Secondary | ICD-10-CM | POA: Diagnosis not present

## 2014-08-27 DIAGNOSIS — D6481 Anemia due to antineoplastic chemotherapy: Secondary | ICD-10-CM | POA: Diagnosis not present

## 2014-08-27 DIAGNOSIS — C7800 Secondary malignant neoplasm of unspecified lung: Secondary | ICD-10-CM

## 2014-08-27 DIAGNOSIS — Z5111 Encounter for antineoplastic chemotherapy: Secondary | ICD-10-CM | POA: Diagnosis not present

## 2014-08-27 DIAGNOSIS — R110 Nausea: Secondary | ICD-10-CM

## 2014-08-27 DIAGNOSIS — Z8042 Family history of malignant neoplasm of prostate: Secondary | ICD-10-CM

## 2014-08-27 MED ORDER — OXALIPLATIN CHEMO INJECTION 100 MG/20ML
80.0000 mg/m2 | Freq: Once | INTRAVENOUS | Status: AC
  Administered 2014-08-27: 200 mg via INTRAVENOUS
  Filled 2014-08-27: qty 40

## 2014-08-27 MED ORDER — DEXTROSE 5 % IV SOLN
Freq: Once | INTRAVENOUS | Status: AC
  Administered 2014-08-27: 11:00:00 via INTRAVENOUS

## 2014-08-27 MED ORDER — LEUCOVORIN CALCIUM INJECTION 350 MG
400.0000 mg/m2 | Freq: Once | INTRAMUSCULAR | Status: AC
  Administered 2014-08-27: 996 mg via INTRAVENOUS
  Filled 2014-08-27: qty 49.8

## 2014-08-27 MED ORDER — DEXAMETHASONE SODIUM PHOSPHATE 100 MG/10ML IJ SOLN
Freq: Once | INTRAMUSCULAR | Status: AC
  Administered 2014-08-27: 11:00:00 via INTRAVENOUS
  Filled 2014-08-27: qty 4

## 2014-08-27 MED ORDER — SODIUM CHLORIDE 0.9 % IV SOLN
2280.0000 mg/m2 | INTRAVENOUS | Status: DC
  Administered 2014-08-27: 5700 mg via INTRAVENOUS
  Filled 2014-08-27: qty 114

## 2014-08-27 NOTE — Telephone Encounter (Signed)
Per staff message and POF I have scheduled appts. Advised scheduler of appts. JMW  

## 2014-08-27 NOTE — Telephone Encounter (Signed)
Gave asv & calendar for April/May. Sent message to schedule treatment

## 2014-08-27 NOTE — Progress Notes (Signed)
[place]  Telephone:([phone]) [phone] Fax:(336) [place] Note   No care team member to display 08/27/2014  CHIEF COMPLAINTS Follow up metastatic gastric cancer    Gastric adenocarcinoma: Stage IV   04/23/2014 Imaging CT abdomen showed peritoneal carcinomatosis, ascites, thickning of gastric and GEJ wall, CT chest showed a few small b/l lung nodules.    04/24/2014 Tumor Marker CEA  5.6, CA19.9 13   04/25/2014 Initial Diagnosis Gastric adenocarcinoma: Stage IV   04/25/2014 Pathology Results Stomach mass biopsy showed - ADENOCARCINOMA WITH SIGNET RING FEATURES. ascites cytology (+)   04/25/2014 Procedure EGD: There was a large, nearly circumferential, ulcerated, clearly malignant mass that stradles the GE junction. The vast bulk of the mass lays in the stomach.    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 I saw him first when he was recently [facility] He is here for the first office visit after hospital discharge.  He was  admitted on 12/15 with progressive, 4 month history of dysphagia, epigastric abdominal discomfort and bloating, early satiation accompanied by nausea, and postprandial vomiting and diarrhea. He has experienced a 30 lbs weight loss in the process. He has noted increased abdominal girth. Patient denies any shortness of breath, chest pain, GI bleed, hematuria or lower extremity swelling. He denies heavy alcohol intake. Denies tobacco abuse. Denies risk factors for HIV or hepatitis. He denies  any family history of GI malignancies.  CT of the abdomen and pelvis with contrast on 12/15 revealed several pulmonary nodules at the bases bilaterally, profound thickening of the distal esophagus extending beyond the gastroesophageal junction into the proximal stomach,cardia and fundus of the stomach, most evident along the lesser curvature. No pathologic dilatation of small bowel or colon. Extensive upper abdominal and retroperitoneal lymphadenopathy was visualized. Moderate volume of ascites, presumably malignant, was noted. Extensive soft tissue nodularity throughout the omentum, compatible with omental caking and widespread peritoneal metastasis. Additionally, there are other areas of enhancing nodularity along the peritoneal surface. Tiny ventral and umbilical hernias containing predominantly omental fat, including some omental implants. There are no aggressive appearing lytic or blastic lesions noted in the visualized portions of the skeleton.  He underwent an EGD by Dr. JArdis Hughson 04/25/2014, which showed a large fungating mass that straddles the GE junction and occupying the upper 1/3 part of the stomach. Multiple biopsy was taken which showed adenocarcinoma with signet ring features. He also underwent a paracentesis with 4.5 L fluid is removed on 04/26/2014. Cytology was positive for adenocarcinoma with signet ring features, consistent with metastatic disease.  He was discharged to home on 04/27/2014. He did feel a slightly informant of his abdominal bloating after the paracentesis, but not much improvement other symptoms. He had Mediport placement and the second paracentesis was again 4 L fluids removal on 05/02/2014. His appetite is low, he drinks ensure 3 bottles a day, some soup and a very little other foods. His energy level is also low, but able to take care of all his daily needs, and move around  with about much difficulty. His abdominal discomfort/pain are relatively controlled by morphine  15 mg at night and tramadol 2-3 times during the day. He denies any fever, chills, cough, chest pain or any in the other discomfort.  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 He continues doing well, without any complaints. He denies any significant abdominal pain or bloating. He is eating well, his energy level is fairly good  and he remains physically active. He denies any fever or chills. His weight is stable. His sister states " this is the best he has ever been in the past few years".  (MEDICAL HISTORY:  Past Medical History  Diagnosis Date  . Medical history non-contributory   . gastric ca dx'd 04/2014    SURGICAL HISTORY: Past Surgical History  Procedure Laterality Date  . Squamous cell carcinoma excision  approx March 2015    removed from top of head  . Esophagogastroduodenoscopy (egd) with propofol N/A 04/25/2014    Procedure: ESOPHAGOGASTRODUODENOSCOPY (EGD) WITH PROPOFOL;  Surgeon: Milus Banister, MD;  Location: [facility];  Service: Endoscopy;  Laterality: N/A;    SOCIAL HISTORY: History   Social History  . Marital Status: Single    Spouse Name: N/A  . Number of Children: N/A  . Years of Education: N/A   Occupational History  . Not on file.   Social History Main Topics  . Smoking status: Never Smoker   . Smokeless tobacco: Never Used  . Alcohol Use: No  . Drug Use: No  . Sexual Activity: Not on file   Other Topics Concern  . Not on file   Social History Narrative    FAMILY HISTORY: Family History  Problem Relation Age of Onset  . Breast cancer Sister 42   . Hypertension Mother   . Hypertension Father   Maternal aunt breast cancer at age of 3 Maternal cousin ovarian cancer at age of 41 Maternal ancle prostate cancer age of 36   ALLERGIES:  has No Known Allergies.  MEDICATIONS:  Current Outpatient Prescriptions  Medication Sig  Dispense Refill  . lidocaine-prilocaine (EMLA) cream Apply 1 application topically as needed. 30 g 0  . docusate sodium 100 MG CAPS Take 100 mg by mouth 2 (two) times daily. (Patient not taking: Reported on 08/27/2014) 10 capsule 0  . mirtazapine (REMERON) 15 MG tablet Take 1 tablet (15 mg total) by mouth at bedtime. (Patient not taking: Reported on 08/27/2014) 30 tablet 1  . morphine (MS CONTIN) 15 MG 12 hr tablet Take 1 tablet (15 mg total) by mouth every 12 (twelve) hours. (Patient not taking: Reported on 08/27/2014) 60 tablet 0  . ondansetron (ZOFRAN) 4 MG tablet Take 1 tablet (4 mg total) by mouth every 8 (eight) hours as needed for nausea or vomiting. (Patient not taking: Reported on 08/27/2014) 20 tablet 0  . pantoprazole (PROTONIX) 40 MG tablet Take 1 tablet (40 mg total) by mouth daily. (Patient not taking: Reported on 08/27/2014) 30 tablet 0  . prochlorperazine (COMPAZINE) 10 MG tablet Take 1 tablet (10 mg total) by mouth every 6 (six) hours as needed for nausea or vomiting. (Patient not taking: Reported on 08/27/2014) 30 tablet 3  . traMADol (ULTRAM) 50 MG tablet Take 1-2 tablets (50-100 mg total) by mouth every 6 (six) hours as needed for moderate pain. (Patient not taking: Reported on 08/27/2014) 30 tablet 0   No current facility-administered  medications for this visit.    REVIEW OF SYSTEMS:   Constitutional: Denies fevers, chills or abnormal night sweats, (+) weight loss and anorexia Eyes: Denies blurriness of vision, double vision or watery eyes Ears, nose, mouth, throat, and face: Denies mucositis or sore throat Respiratory: Denies cough, dyspnea or wheezes Cardiovascular: Denies palpitation, chest discomfort or lower extremity swelling Gastrointestinal:  Less nausea, abdominal bloating and discomfort Skin: Denies abnormal skin rashes Lymphatics: Denies new lymphadenopathy or easy bruising Neurological:Denies numbness, tingling or new weaknesses Behavioral/Psych: Mood is stable, no  new changes  All other systems were reviewed with the patient and are negative except those mentioned in the history.  PHYSICAL EXAMINATION: ECOG PERFORMANCE STATUS: 1  GENERAL:alert, no distress and comfortable SKIN: skin color, texture, turgor are normal, no rashes or significant lesions EYES: normal, conjunctiva are pink and non-injected, sclera clear OROPHARYNX:no exudate, no erythema and lips, buccal mucosa, and tongue normal  NECK: supple, thyroid normal size, non-tender, without nodularity LYMPH:  no palpable lymphadenopathy in the cervical, axillary or inguinal LUNGS: clear to auscultation and percussion with normal breathing effort HEART: regular rate & rhythm and no murmurs and no lower extremity edema ABDOMEN:abdomen soft, non-tender and normal bowel sounds, probable small amount of ascites. Musculoskeletal:no cyanosis of digits and no clubbing  PSYCH: alert & oriented x 3 with fluent speech NEURO: no focal motor/sensory deficits  LABORATORY DATA:  I have reviewed the data as listed Lab Results  Component Value Date   WBC 7.1 08/26/2014   HGB 12.7* 08/26/2014   HCT 40.1 08/26/2014   MCV 85.9 08/26/2014   PLT 104* 08/26/2014    Recent Labs  04/25/14 0434 04/26/14 0810 04/27/14 0453  07/29/14 0851 08/12/14 0836 08/26/14 0907  NA 137 135* 139  < > 142 142 141  K 4.2 3.9 4.2  < > 4.1 3.9 3.9  CL 99 98 100  --   --   --   --   CO2 _0 < > _1 GLUCOSE 88 144* 113*  < > 96 107 107  BUN _2 < > 15.1 16.0 13.6  CREATININE 0.98 0.90 0.92  < > 0.8 0.8 0.8  CALCIUM 8.7 8.8 8.7  < > 8.8 8.8 8.8  GFRNONAA >90 >90 >90  --   --   --   --   GFRAA >90 >90 >90  --   --   --   --   PROT 6.3  --  6.0  < > 6.3* 6.2* 6.7  ALBUMIN 2.3*  --  2.1*  < > 2.9* 2.8* 3.0*  AST 17  --  18  < > _3 ALT 10  --  9  < > _4 ALKPHOS 82  --  101  < > 128 119 169*  BILITOT 0.5  --  0.5  < > 0.35 0.36 0.35  < > = values in this interval not displayed. Pathology  report  Diagnosis  PERITONEAL/ASCITIC FLUID(SPECIMEN 1 OF 1 COLLECTED 04/26/14): METASTATIC ADENOCARCINOMA WITH SIGNET RING CELL FEATURES.  Stomach, biopsy, r/o cancer 04/25/14 - ADENOCARCINOMA WITH SIGNET RING FEATURES. Microscopic Comment The results were called to Dr. Ardis Hughs on 04/26/2014. (JDP:kh 04/26/14) Claudette Laws MD Pathologist, Electronic Signature (Case signed 04/26/2014)  HER-2/NEU BY CISH - NO AMPLIFICATION OF HER-2 DETECTED. RESULT RATIO OF HER2: CEP 17 SIGNALS 1.20 AVERAGE HER2 COPY NUMBER PER CELL 2.10  ADDITIONAL INFORMATION: Mismatch Repair (MMR) Protein Immunohistochemistry (IHC)  IHC Expression Result: MLH1: Preserved nuclear expression (greater 50% tumor expression) MSH2: Preserved nuclear expression (greater 50% tumor expression) MSH6: Preserved nuclear expression (greater 50% tumor expression) PMS2: Preserved nuclear expression (greater 50% tumor expression) * Internal control demonstrates intact nuclear expression Interpretation: NORMAL There is preserved expression of the major and minor MMR proteins. There is a very low probability that microsatellite instability (MSI) is present. However, certain clinically significant MMR protein mutations may result in preservation of nuclear expression. It is recommended that the preservation of protein expression be correlated with molecular based MSI testing.  [facility] [employer] testing: (+) OE42 and NF1   RADIOGRAPHIC STUDIES: I have personally reviewed the radiological images as listed and agreed with the findings in the report.  Ct Chest, abdomen and pelvis Wo Contrast12/24/2015    IMPRESSION:  1. Findings, as above, highly concerning for either esophageal or gastric primary malignancy, with metastatic lymphadenopathy in the upper abdomen and retroperitoneum, widespread intraperitoneal spread of disease, malignant ascites, and multiple pulmonary nodules in the visualize lung bases concerning for pulmonary  metastasis. Oncologic evaluation is recommended.  2. Additional incidental findings, as above. These results were called by telephone at the time of interpretation on 04/23/2014 at 7:53 pm to [place] , who verbally acknowledged these results.   Electronically Signed   By: Vinnie Langton M.D.   On: 04/23/2014 19:56   CT CAP 06/24/2014 IMPRESSION: 1. Progressive low density and calcifications within the extensive upper abdominal lymphadenopathy, likely related to partial treatment. Some of the lymph nodes do appear slightly larger. The irregular infiltrating mass involving the gastric cardia appears slightly improved. 2. No evidence of hepatic metastatic disease. 3. Overall volume of ascites has improved, although extensive changes of peritoneal carcinomatosis have not significantly changed. 4. No significant change in bilateral pulmonary nodules consistent with metastatic disease. 5. Resolution of bilateral pleural effusions.  CT chest, abdomen and pelvis with contrast on 08/26/2014 IMPRESSION: Overall stable to slight progression of disease. Existing pulmonary nodules are stable to slightly progressed with no new pulmonary nodules evident. The bulky upper abdominal lymphadenopathy is stable to minimally progressed while no substantial change in the primary infiltrating gastric neoplasm is discernible. Omental disease appears stable. There is slightly more ascites on today's study.   EGD WITH BIOPSY 04/25/2014 ENDOSCOPIC IMPRESSION: There was a large, nearly circumferential, ulcerated, clearly malignant mass that stradles the GE junction. The vast bulk of the mass lays in the stomach, occupying about 1/3 of the stomach. The mass extends into the esophagus for about 4cm above the GE junction. Mutiple biopsies were taken from the gastric portion of the mass   I ASSESSMENT & PLAN:  39 year old gentleman without significant past medical history presents with abdominal  bloating, nausea, vomiting, diarrhea, anemia and weight loss. EGD showed a large mass arising from the gastric fundus and extending into GE junction, biopsy showed adenocarcinoma with signet ring features, CT scan is consistent with peritoneal carcinomatosis and bilateral lung metastasis. Ascites cytology was positive for malignant cells.  1. 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 HER-2 negative, MMR-normal -I discussed his surgical biopsy results, cytology results, skin findings with patient and his family members, including his parents and 2 sisters. Unfortunately, this is widely metastatic, not curable, and overall prognosis is very poor. Literature has showed gastric adenocarcinoma with signet ring features has overall worse outcome comparing to others. His life expectancy is probably less than 6 months without treatment. -He has started first-line  chemotherapy FOLFOX, tolerated well, and his symptoms have much  improved. -His tumor is negative for HER-2 overexpression, no role for trastuzumab. -We also checked his tumor for MMR, which was normal -[facility] tonight test reviewed no actionable mutation -His recent first restaging CT scan showed mild partial response, overall stable disease. His ascites has significantly decreased. Pleural effusion resolved. He is clinically doing well.  -I reviewed his restaging scan from yesterday. It shows stable disease overall, with few lymph nodes slightly bigger, no significant new lesions.  -I recommend to continue the current FOLFOX chemotherapy. -he is interested in pursuing [place] in [place], which is a intravenous infusion for total of 2 weeks. Giving the slight disease progression on the scan, i.e. recommends to hold it for now.  -Lab are reviewed, mild some cytopenia, we'll proceed cycle 9 chemotherapy today. His 5-FU dose will be reduced by 5% due to the cytopenia.  2. Malignant  ascites -resolved now after chemo   3. Anorexia, nausea, malnutrition -Nearly resolved, he feels much better lately -His albumin level is still low, the nutrition secondary to malignancy. -I encouraged him to take protein powder and diet supplements as needed.  4. Anemia, multifactorial (iron deficient anemia, cancer related and chemotherapy induced ) -His ferritin level was normal at 171, but he has low iron and saturation, likely some degree of iron deficient anemia. -He has received IV Feraheme 510 mg twice, 2 weeks apart -Blood transfusion as needed during chemotherapy if her hemoglobin below 8. -Mild anemia has been stable.    Plan #1 cycle 9 FOLFOX today,  No 5-FU bolus, 5-fu dose 5% reduction, no Neulasta on day 3 with this cycle (likely need every other cycle) #2 RTC in 2 weeks for cycle 9 with APP, I will see him back in 4 weeks with cycle 11 -lab the day before each chemo   Patient and his family members had many questions. All questions were answered. The patient knows to call the clinic with any problems, questions or concerns.  I spent 20 minutes counseling the patient face to face. The total time spent in the appointment was 25  minutes and more than 50% was on counseling.     Truitt Merle, MD 08/27/2014

## 2014-08-27 NOTE — Patient Instructions (Signed)
[facility] Discharge Instructions for Patients Receiving Chemotherapy  Today you received the following chemotherapy agents oxaliplatin/leucovorin/fluorouracil  To help prevent nausea and vomiting after your treatment, we encourage you to take your nausea medication as directed If you develop nausea and vomiting that is not controlled by your nausea medication, call the clinic.   BELOW ARE SYMPTOMS THAT SHOULD BE REPORTED IMMEDIATELY:  *FEVER GREATER THAN 100.5 F  *CHILLS WITH OR WITHOUT FEVER  NAUSEA AND VOMITING THAT IS NOT CONTROLLED WITH YOUR NAUSEA MEDICATION  *UNUSUAL SHORTNESS OF BREATH  *UNUSUAL BRUISING OR BLEEDING  TENDERNESS IN MOUTH AND THROAT WITH OR WITHOUT PRESENCE OF ULCERS  *URINARY PROBLEMS  *BOWEL PROBLEMS  UNUSUAL RASH Items with * indicate a potential emergency and should be followed up as soon as possible.  Feel free to call the clinic you have any questions or concerns. The clinic phone number is [phone].  

## 2014-08-27 NOTE — Progress Notes (Signed)
Briefly met with patient in chemotherapy.  He is receiving chemotherapy for metastatic gastric cancer. Patient continues to eat well and denies nutrition side effects. Weight increased and documented as 279.4 pounds increased from 274.6 pounds March 21. Patient has no questions.  Nutrition diagnosis: Inadequate oral intake has resolved.  Encouraged patient to continue intake of healthy diet with lean protein sources to promote weight maintenance. Patient will contact me with questions or concerns.   Teach back method used.

## 2014-08-29 ENCOUNTER — Encounter: Payer: Self-pay | Admitting: Hematology

## 2014-08-29 ENCOUNTER — Ambulatory Visit (HOSPITAL_BASED_OUTPATIENT_CLINIC_OR_DEPARTMENT_OTHER): Payer: BLUE CROSS/BLUE SHIELD

## 2014-08-29 VITALS — BP 140/82 | HR 60 | Temp 97.9°F

## 2014-08-29 DIAGNOSIS — C169 Malignant neoplasm of stomach, unspecified: Secondary | ICD-10-CM | POA: Diagnosis not present

## 2014-08-29 DIAGNOSIS — C786 Secondary malignant neoplasm of retroperitoneum and peritoneum: Secondary | ICD-10-CM

## 2014-08-29 DIAGNOSIS — C7800 Secondary malignant neoplasm of unspecified lung: Secondary | ICD-10-CM

## 2014-08-29 MED ORDER — SODIUM CHLORIDE 0.9 % IJ SOLN
10.0000 mL | INTRAMUSCULAR | Status: DC | PRN
  Administered 2014-08-29: 10 mL
  Filled 2014-08-29: qty 10

## 2014-08-29 MED ORDER — HEPARIN SOD (PORK) LOCK FLUSH 100 UNIT/ML IV SOLN
500.0000 [IU] | Freq: Once | INTRAVENOUS | Status: AC | PRN
  Administered 2014-08-29: 500 [IU]
  Filled 2014-08-29: qty 5

## 2014-08-29 NOTE — Progress Notes (Signed)
I faxed notes/labs from 06/10/14-present to [facility]  [phone]

## 2014-08-29 NOTE — Patient Instructions (Signed)
Fluorouracil, 5FU; Diclofenac topical cream What is this medicine? FLUOROURACIL; DICLOFENAC (flure oh YOOR a sil; dye KLOE fen ak) is a combination of a topical chemotherapy agent and non-steroidal anti-inflammatory drug (NSAID). It is used on the skin to treat skin cancer and skin conditions that could become cancer. This medicine may be used for other purposes; ask your health care provider or pharmacist if you have questions. COMMON BRAND NAME(S): FLUORAC What should I tell my health care provider before I take this medicine? They need to know if you have any of these conditions: -bleeding problems -cigarette smoker -DPD enzyme deficiency -heart disease -high blood pressure -if you frequently drink alcohol containing drinks -kidney disease -liver disease -open or infected skin -stomach problems -swelling or open sores at the treatment site -recent or planned coronary artery bypass graft (CABG) surgery -an unusual or allergic reaction to fluorouracil, diclofenac, aspirin, other NSAIDs, other medicines, foods, dyes, or preservatives -pregnant or trying to get pregnant -breast-feeding How should I use this medicine? This medicine is only for use on the skin. Follow the directions on the prescription label. Wash hands before and after use. Wash affected area and gently pat dry. To apply this medicine use a cotton-tipped applicator, or use gloves if applying with fingertips. If applied with unprotected fingertips, it is very important to wash your hands well after you apply this medicine. Avoid applying to the eyes, nose, or mouth. Apply enough medicine to cover the affected area. You can cover the area with a light gauze dressing, but do not use tight or air-tight dressings. Finish the full course prescribed by your doctor or health care professional, even if you think your condition is better. Do not stop taking except on the advice of your doctor or health care professional. Talk to your  pediatrician regarding the use of this medicine in children. Special care may be needed. Overdosage: If you think you've taken too much of this medicine contact a poison control center or emergency room at once. Overdosage: If you think you have taken too much of this medicine contact a poison control center or emergency room at once. NOTE: This medicine is only for you. Do not share this medicine with others. What if I miss a dose? If you miss a dose, apply it as soon as you can. If it is almost time for your next dose, only use that dose. Do not apply extra doses. Contact your doctor or health care professional if you miss more than one dose. What may interact with this medicine? Interactions are not expected. Do not use any other skin products without telling your doctor or health care professional. This list may not describe all possible interactions. Give your health care provider a list of all the medicines, herbs, non-prescription drugs, or dietary supplements you use. Also tell them if you smoke, drink alcohol, or use illegal drugs. Some items may interact with your medicine. What should I watch for while using this medicine? Visit your doctor or health care professional for checks on your progress. You will need to use this medicine for 2 to 6 weeks. This may be longer depending on the condition being treated. You may not see full healing for another 1 to 2 months after you stop using the medicine. Treated areas of skin can look unsightly during and for several weeks after treatment with this medicine. This medicine can make you more sensitive to the sun. Keep out of the sun. If you cannot avoid being in   the sun, wear protective clothing and use sunscreen. Do not use sun lamps or tanning beds/booths. Where should I keep my What side effects may I notice from receiving this medicine? Side effects that you should report to your doctor or health care professional as soon as possible: -allergic  reactions like skin rash, itching or hives, swelling of the face, lips, or tongue -black or bloody stools, blood in the urine or vomit -blurred vision -chest pain -difficulty breathing or wheezing -redness, blistering, peeling or loosening of the skin, including inside the mouth -severe redness and swelling of normal skin -slurred speech or weakness on one side of the body -trouble passing urine or change in the amount of urine -unexplained weight gain or swelling -unusually weak or tired -yellowing of eyes or skin Side effects that usually do not require medical attention (Report these to your doctor or health care professional if they continue or are bothersome.): -increased sensitivity of the skin to sun and ultraviolet light -pain and burning of the affected area -scaling or swelling of the affected area -skin rash, itching of the affected area -tenderness This list may not describe all possible side effects. Call your doctor for medical advice about side effects. You may report side effects to FDA at 1-800-FDA-1088. Where should I keep my medicine? Keep out of the reach of children. Store at room temperature between 20 and 25 degrees C (68 and 77 degrees F). Throw away any unused medicine after the expiration date. NOTE: This sheet is a summary. It may not cover all possible information. If you have questions about this medicine, talk to your doctor, pharmacist, or health care provider.  2015, [employer] Standard. (2013-08-27 11:09:58)  

## 2014-09-09 ENCOUNTER — Other Ambulatory Visit (HOSPITAL_BASED_OUTPATIENT_CLINIC_OR_DEPARTMENT_OTHER): Payer: BLUE CROSS/BLUE SHIELD

## 2014-09-09 DIAGNOSIS — C786 Secondary malignant neoplasm of retroperitoneum and peritoneum: Secondary | ICD-10-CM | POA: Diagnosis not present

## 2014-09-09 DIAGNOSIS — C7800 Secondary malignant neoplasm of unspecified lung: Secondary | ICD-10-CM

## 2014-09-09 DIAGNOSIS — C169 Malignant neoplasm of stomach, unspecified: Secondary | ICD-10-CM

## 2014-09-09 LAB — COMPREHENSIVE METABOLIC PANEL (CC13)
ALT: 12 U/L (ref 0–55)
AST: 18 U/L (ref 5–34)
Albumin: 2.8 g/dL — ABNORMAL LOW (ref 3.5–5.0)
Alkaline Phosphatase: 143 U/L (ref 40–150)
Anion Gap: 10 mEq/L (ref 3–11)
BUN: 16.5 mg/dL (ref 7.0–26.0)
CO2: 24 meq/L (ref 22–29)
Calcium: 8.8 mg/dL (ref 8.4–10.4)
Chloride: 109 mEq/L (ref 98–109)
Creatinine: 0.7 mg/dL (ref 0.7–1.3)
EGFR: >90 mL/min/{1.73_m2} (ref >90)
Glucose: 122 mg/dl (ref 70–140)
Potassium: 3.8 mEq/L (ref 3.5–5.1)
Sodium: 142 mEq/L (ref 136–145)
TOTAL PROTEIN: 6.4 g/dL (ref 6.4–8.3)
Total Bilirubin: 0.41 mg/dL (ref 0.20–1.20)

## 2014-09-09 LAB — CBC WITH DIFFERENTIAL/PLATELET
BASO%: 0.7 % (ref 0.0–2.0)
Basophils Absolute: 0 10*3/uL (ref 0.0–0.1)
EOS%: 2.3 % (ref 0.0–7.0)
Eosinophils Absolute: 0.1 10*3/uL (ref 0.0–0.5)
HEMATOCRIT: 38.9 % (ref 38.4–49.9)
HGB: 12.4 g/dL — ABNORMAL LOW (ref 13.0–17.1)
LYMPH#: 1.4 10*3/uL (ref 0.9–3.3)
LYMPH%: 29.3 % (ref 14.0–49.0)
MCH: 27.5 pg (ref 27.2–33.4)
MCHC: 31.9 g/dL — ABNORMAL LOW (ref 32.0–36.0)
MCV: 86 fL (ref 79.3–98.0)
MONO#: 0.9 10*3/uL (ref 0.1–0.9)
MONO%: 19 % — AB (ref 0.0–14.0)
NEUT%: 48.7 % (ref 39.0–75.0)
NEUTROS ABS: 2.3 10*3/uL (ref 1.5–6.5)
Platelets: 135 10*3/uL — ABNORMAL LOW (ref 140–400)
RBC: 4.52 10*6/uL (ref 4.20–5.82)
RDW: 18.1 % — AB (ref 11.0–14.6)
WBC: 4.7 10*3/uL (ref 4.0–10.3)

## 2014-09-10 ENCOUNTER — Telehealth: Payer: Self-pay | Admitting: Hematology

## 2014-09-10 ENCOUNTER — Ambulatory Visit (HOSPITAL_BASED_OUTPATIENT_CLINIC_OR_DEPARTMENT_OTHER): Payer: BLUE CROSS/BLUE SHIELD

## 2014-09-10 ENCOUNTER — Ambulatory Visit (HOSPITAL_BASED_OUTPATIENT_CLINIC_OR_DEPARTMENT_OTHER): Payer: BLUE CROSS/BLUE SHIELD | Admitting: Hematology

## 2014-09-10 ENCOUNTER — Encounter: Payer: Self-pay | Admitting: Hematology

## 2014-09-10 ENCOUNTER — Other Ambulatory Visit: Payer: BLUE CROSS/BLUE SHIELD

## 2014-09-10 VITALS — BP 127/58 | HR 78 | Temp 97.8°F | Resp 18 | Ht 69.0 in | Wt 281.4 lb

## 2014-09-10 DIAGNOSIS — Z8042 Family history of malignant neoplasm of prostate: Secondary | ICD-10-CM

## 2014-09-10 DIAGNOSIS — C786 Secondary malignant neoplasm of retroperitoneum and peritoneum: Secondary | ICD-10-CM

## 2014-09-10 DIAGNOSIS — D509 Iron deficiency anemia, unspecified: Secondary | ICD-10-CM

## 2014-09-10 DIAGNOSIS — C7802 Secondary malignant neoplasm of left lung: Secondary | ICD-10-CM | POA: Diagnosis not present

## 2014-09-10 DIAGNOSIS — D630 Anemia in neoplastic disease: Secondary | ICD-10-CM

## 2014-09-10 DIAGNOSIS — Z5111 Encounter for antineoplastic chemotherapy: Secondary | ICD-10-CM | POA: Diagnosis not present

## 2014-09-10 DIAGNOSIS — C169 Malignant neoplasm of stomach, unspecified: Secondary | ICD-10-CM | POA: Diagnosis not present

## 2014-09-10 DIAGNOSIS — C7801 Secondary malignant neoplasm of right lung: Secondary | ICD-10-CM

## 2014-09-10 DIAGNOSIS — Z803 Family history of malignant neoplasm of breast: Secondary | ICD-10-CM

## 2014-09-10 DIAGNOSIS — R634 Abnormal weight loss: Secondary | ICD-10-CM

## 2014-09-10 DIAGNOSIS — R630 Anorexia: Secondary | ICD-10-CM

## 2014-09-10 DIAGNOSIS — D696 Thrombocytopenia, unspecified: Secondary | ICD-10-CM

## 2014-09-10 DIAGNOSIS — D6481 Anemia due to antineoplastic chemotherapy: Secondary | ICD-10-CM

## 2014-09-10 DIAGNOSIS — E46 Unspecified protein-calorie malnutrition: Secondary | ICD-10-CM

## 2014-09-10 DIAGNOSIS — R109 Unspecified abdominal pain: Secondary | ICD-10-CM

## 2014-09-10 DIAGNOSIS — Z95828 Presence of other vascular implants and grafts: Secondary | ICD-10-CM

## 2014-09-10 MED ORDER — SODIUM CHLORIDE 0.9 % IV SOLN
2280.0000 mg/m2 | INTRAVENOUS | Status: DC
  Administered 2014-09-10: 5700 mg via INTRAVENOUS
  Filled 2014-09-10: qty 114

## 2014-09-10 MED ORDER — LEUCOVORIN CALCIUM INJECTION 350 MG
400.0000 mg/m2 | Freq: Once | INTRAVENOUS | Status: AC
  Administered 2014-09-10: 996 mg via INTRAVENOUS
  Filled 2014-09-10: qty 49.8

## 2014-09-10 MED ORDER — SODIUM CHLORIDE 0.9 % IV SOLN
Freq: Once | INTRAVENOUS | Status: AC
  Administered 2014-09-10: 11:00:00 via INTRAVENOUS
  Filled 2014-09-10: qty 4

## 2014-09-10 MED ORDER — SODIUM CHLORIDE 0.9 % IJ SOLN
10.0000 mL | INTRAMUSCULAR | Status: DC | PRN
  Administered 2014-09-10: 10 mL via INTRAVENOUS
  Filled 2014-09-10: qty 10

## 2014-09-10 MED ORDER — PROCHLORPERAZINE MALEATE 10 MG PO TABS
ORAL_TABLET | ORAL | Status: AC
  Filled 2014-09-10: qty 1

## 2014-09-10 MED ORDER — OXALIPLATIN CHEMO INJECTION 100 MG/20ML
80.0000 mg/m2 | Freq: Once | INTRAVENOUS | Status: AC
  Administered 2014-09-10: 200 mg via INTRAVENOUS
  Filled 2014-09-10: qty 40

## 2014-09-10 MED ORDER — DEXTROSE 5 % IV SOLN
Freq: Once | INTRAVENOUS | Status: AC
  Administered 2014-09-10: 11:00:00 via INTRAVENOUS

## 2014-09-10 MED ORDER — PROCHLORPERAZINE MALEATE 10 MG PO TABS: 10.0000 mg | ORAL_TABLET | Freq: Four times a day (QID) | ORAL | Status: DC | PRN

## 2014-09-10 NOTE — Progress Notes (Signed)
[place]  [phone] Fax:(336) [phone]  Clinic New Consult Note   Patient Care Team: No Pcp Per Patient as PCP - General (General Practice) 09/10/2014  CHIEF COMPLAINTS Follow up metastatic gastric cancer    Gastric adenocarcinoma: Stage IV   04/23/2014 Imaging CT abdomen showed peritoneal carcinomatosis, ascites, thickning of gastric and GEJ wall, CT chest showed a few small b/l lung nodules.    04/24/2014 Tumor Marker CEA  5.6, CA19.9 13   04/25/2014 Initial Diagnosis Gastric adenocarcinoma: Stage IV   04/25/2014 Pathology Results Stomach mass biopsy showed - ADENOCARCINOMA WITH SIGNET RING FEATURES. ascites cytology (+)   04/25/2014 Procedure EGD: There was a large, nearly circumferential, ulcerated, clearly malignant mass that stradles the GE junction. The vast bulk of the mass lays in the stomach.    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 I saw him first when he was recently [facility] He is here for the first office visit after hospital discharge.  He was  admitted on 12/15 with progressive, 4 month history of dysphagia, epigastric abdominal discomfort and bloating, early satiation accompanied by nausea, and postprandial vomiting and diarrhea. He has experienced a 30 lbs weight loss in the process. He has noted increased abdominal girth. Patient denies any shortness of breath, chest pain, GI bleed, hematuria or lower extremity swelling. He denies heavy alcohol intake. Denies tobacco abuse. Denies risk  factors for HIV or hepatitis. He denies any family history of GI malignancies.  CT of the abdomen and pelvis with contrast on 12/15 revealed several pulmonary nodules at the bases bilaterally, profound thickening of the distal esophagus extending beyond the gastroesophageal junction into the proximal stomach,cardia and fundus of the stomach, most evident along the lesser curvature. No pathologic dilatation of small bowel or colon. Extensive upper abdominal and retroperitoneal lymphadenopathy was visualized. Moderate volume of ascites, presumably malignant, was noted. Extensive soft tissue nodularity throughout the omentum, compatible with omental caking and widespread peritoneal metastasis. Additionally, there are other areas of enhancing nodularity along the peritoneal surface. Tiny ventral and umbilical hernias containing predominantly omental fat, including some omental implants. There are no aggressive appearing lytic or blastic lesions noted in the visualized portions of the skeleton.  He underwent an EGD by Dr. JArdis Hughson 04/25/2014, which showed a large fungating mass that straddles the GE junction and occupying the upper 1/3 part of the stomach. Multiple biopsy was taken which showed adenocarcinoma with signet ring features. He also underwent a paracentesis with 4.5 L fluid is removed on 04/26/2014. Cytology was positive for adenocarcinoma with signet ring features, consistent with metastatic disease.  He was discharged to home on 04/27/2014. He did feel a slightly informant of his abdominal bloating after the paracentesis, but not much improvement other symptoms. He had Mediport placement and the second paracentesis was again 4 L fluids removal on 05/02/2014. His appetite is low, he drinks ensure 3 bottles a day, some soup and a very little other foods. His energy level is also low, but able to take care of  all his daily needs, and move around with about much difficulty. His abdominal  discomfort/pain are relatively controlled by morphine 15 mg at night and tramadol 2-3 times during the day. He denies any fever, chills, cough, chest pain or any in the other discomfort.  [employer] genetic test (9/39/0300): NF1 splice site [id number] del 70 TP53 R213*  INTERIM HISTORY: He returns for follow up and cycle 10 chemotherapy. He continues doing well, without any complaints.  He has good energy level, no limitation on his activities. He is eating well, no nausea, abdominal bloating or discomfort. His bowel movement is normal. He denies any fever or chills, or any bleeding. (MEDICAL HISTORY:  Past Medical History  Diagnosis Date  . Medical history non-contributory   . gastric ca dx'd 04/2014    SURGICAL HISTORY: Past Surgical History  Procedure Laterality Date  . Squamous cell carcinoma excision  approx March 2015    removed from top of head  . Esophagogastroduodenoscopy (egd) with propofol N/A 04/25/2014    Procedure: ESOPHAGOGASTRODUODENOSCOPY (EGD) WITH PROPOFOL;  Surgeon: Milus Banister, MD;  Location: [facility];  Service: Endoscopy;  Laterality: N/A;    SOCIAL HISTORY: History   Social History  . Marital Status: Single    Spouse Name: N/A  . Number of Children: N/A  . Years of Education: N/A   Occupational History  . Not on file.   Social History Main Topics  . Smoking status: Never Smoker   . Smokeless tobacco: Never Used  . Alcohol Use: No  . Drug Use: No  . Sexual Activity: Not on file   Other Topics Concern  . Not on file   Social History Narrative    FAMILY HISTORY: Family History  Problem Relation Age of Onset  . Breast cancer Sister 84   . Hypertension Mother   . Hypertension Father   Maternal aunt breast cancer at age of 28 Maternal cousin ovarian cancer at age of 39 Maternal ancle prostate cancer age of 83   ALLERGIES:  has No Known Allergies.  MEDICATIONS:  Current Outpatient Prescriptions  Medication Sig Dispense Refill    . docusate sodium 100 MG CAPS Take 100 mg by mouth 2 (two) times daily. 10 capsule 0  . lidocaine-prilocaine (EMLA) cream Apply 1 application topically as needed. 30 g 0  . mirtazapine (REMERON) 15 MG tablet Take 1 tablet (15 mg total) by mouth at bedtime. 30 tablet 1  . morphine (MS CONTIN) 15 MG 12 hr tablet Take 1 tablet (15 mg total) by mouth every 12 (twelve) hours. 60 tablet 0  . ondansetron (ZOFRAN) 4 MG tablet Take 1 tablet (4 mg total) by mouth every 8 (eight) hours as needed for nausea or vomiting. 20 tablet 0  . pantoprazole (PROTONIX) 40 MG tablet Take 1 tablet (40 mg total) by mouth daily. 30 tablet 0  . prochlorperazine (COMPAZINE) 10 MG tablet Take 1 tablet (10 mg total) by mouth every 6 (six) hours as needed for nausea or vomiting. 30 tablet 3  . traMADol (ULTRAM) 50 MG tablet Take 1-2 tablets (50-100 mg total) by mouth every 6 (six) hours as needed for moderate pain. 30 tablet 0   No current facility-administered medications for this visit.    REVIEW OF SYSTEMS:   Constitutional: Denies fevers, chills or abnormal night sweats, (+) weight loss and anorexia Eyes: Denies blurriness of vision, double vision or watery eyes Ears, nose, mouth, throat, and face: Denies mucositis or sore throat Respiratory: Denies cough, dyspnea or  wheezes Cardiovascular: Denies palpitation, chest discomfort or lower extremity swelling Gastrointestinal:  Less nausea, abdominal bloating and discomfort Skin: Denies abnormal skin rashes Lymphatics: Denies new lymphadenopathy or easy bruising Neurological:Denies numbness, tingling or new weaknesses Behavioral/Psych: Mood is stable, no new changes  All other systems were reviewed with the patient and are negative except those mentioned in the history.  PHYSICAL EXAMINATION: ECOG PERFORMANCE STATUS: 1  GENERAL:alert, no distress and comfortable SKIN: skin color, texture, turgor are normal, no rashes or significant lesions EYES: normal, conjunctiva  are pink and non-injected, sclera clear OROPHARYNX:no exudate, no erythema and lips, buccal mucosa, and tongue normal  NECK: supple, thyroid normal size, non-tender, without nodularity LYMPH:  no palpable lymphadenopathy in the cervical, axillary or inguinal LUNGS: clear to auscultation and percussion with normal breathing effort HEART: regular rate & rhythm and no murmurs and no lower extremity edema ABDOMEN:abdomen soft, non-tender and normal bowel sounds, probable small amount of ascites. Musculoskeletal:no cyanosis of digits and no clubbing  PSYCH: alert & oriented x 3 with fluent speech NEURO: no focal motor/sensory deficits  LABORATORY DATA:  I have reviewed the data as listed CBC Latest Ref Rng 09/09/2014 08/26/2014 08/12/2014  WBC 4.0 - 10.3 10e3/uL 4.7 7.1 5.2  Hemoglobin 13.0 - 17.1 g/dL 12.4(L) 12.7(L) 12.3(L)  Hematocrit 38.4 - 49.9 % 38.9 40.1 38.9  Platelets 140 - 400 10e3/uL 135(L) 104(L) 147    CMP Latest Ref Rng 09/09/2014 08/26/2014 08/12/2014  Glucose 70 - 140 mg/dl 122 107 107  BUN 7.0 - 26.0 mg/dL 16.5 13.6 16.0  Creatinine 0.7 - 1.3 mg/dL 0.7 0.8 0.8  Sodium 136 - 145 mEq/L 142 141 142  Potassium 3.5 - 5.1 mEq/L 3.8 3.9 3.9  Chloride 96 - 112 mEq/L - - -  CO2 22 - 29 mEq/L 24 23 24   Calcium 8.4 - 10.4 mg/dL 8.8 8.8 8.8  Total Protein 6.4 - 8.3 g/dL 6.4 6.7 6.2(L)  Total Bilirubin 0.20 - 1.20 mg/dL 0.41 0.35 0.36  Alkaline Phos 40 - 150 U/L 143 169(H) 119  AST 5 - 34 U/L 18 18 20   ALT 0 - 55 U/L 12 14 13      Pathology report  Diagnosis  PERITONEAL/ASCITIC FLUID(SPECIMEN 1 OF 1 COLLECTED 04/26/14): METASTATIC ADENOCARCINOMA WITH SIGNET RING CELL FEATURES.  Stomach, biopsy, r/o cancer 04/25/14 - ADENOCARCINOMA WITH SIGNET RING FEATURES. Microscopic Comment The results were called to Dr. Ardis Hughs on 04/26/2014. (JDP:kh 04/26/14) Claudette Laws MD Pathologist, Electronic Signature (Case signed 04/26/2014)  HER-2/NEU BY CISH - NO AMPLIFICATION OF HER-2  DETECTED. RESULT RATIO OF HER2: CEP 17 SIGNALS 1.20 AVERAGE HER2 COPY NUMBER PER CELL 2.10  ADDITIONAL INFORMATION: Mismatch Repair (MMR) Protein Immunohistochemistry (IHC) IHC Expression Result: MLH1: Preserved nuclear expression (greater 50% tumor expression) MSH2: Preserved nuclear expression (greater 50% tumor expression) MSH6: Preserved nuclear expression (greater 50% tumor expression) PMS2: Preserved nuclear expression (greater 50% tumor expression) * Internal control demonstrates intact nuclear expression Interpretation: NORMAL There is preserved expression of the major and minor MMR proteins. There is a very low probability that microsatellite instability (MSI) is present. However, certain clinically significant MMR protein mutations may result in preservation of nuclear expression. It is recommended that the preservation of protein expression be correlated with molecular based MSI testing.  [employer] testing: (+) BW46 and NF1   RADIOGRAPHIC STUDIES: I have personally reviewed the radiological images as listed and agreed with the findings in the report.  Ct Chest, abdomen and pelvis Wo Contrast12/24/2015    IMPRESSION:  1. Findings, as above, highly concerning for either esophageal or gastric primary malignancy, with metastatic lymphadenopathy in the upper abdomen and retroperitoneum, widespread intraperitoneal spread of disease, malignant ascites, and multiple pulmonary nodules in the visualize lung bases concerning for pulmonary metastasis. Oncologic evaluation is recommended.  2. Additional incidental findings, as above. These results were called by telephone at the time of interpretation on 04/23/2014 at 7:53 pm to [place] , who verbally acknowledged these results.   Electronically Signed   By: Vinnie Langton M.D.   On: 04/23/2014 19:56   CT CAP 06/24/2014 IMPRESSION: 1. Progressive low density and calcifications within the extensive upper abdominal  lymphadenopathy, likely related to partial treatment. Some of the lymph nodes do appear slightly larger. The irregular infiltrating mass involving the gastric cardia appears slightly improved. 2. No evidence of hepatic metastatic disease. 3. Overall volume of ascites has improved, although extensive changes of peritoneal carcinomatosis have not significantly changed. 4. No significant change in bilateral pulmonary nodules consistent with metastatic disease. 5. Resolution of bilateral pleural effusions.  CT chest, abdomen and pelvis with contrast on 08/26/2014 IMPRESSION: Overall stable to slight progression of disease. Existing pulmonary nodules are stable to slightly progressed with no new pulmonary nodules evident. The bulky upper abdominal lymphadenopathy is stable to minimally progressed while no substantial change in the primary infiltrating gastric neoplasm is discernible. Omental disease appears stable. There is slightly more ascites on today's study.   EGD WITH BIOPSY 04/25/2014 ENDOSCOPIC IMPRESSION: There was a large, nearly circumferential, ulcerated, clearly malignant mass that stradles the GE junction. The vast bulk of the mass lays in the stomach, occupying about 1/3 of the stomach. The mass extends into the esophagus for about 4cm above the GE junction. Mutiple biopsies were taken from the gastric portion of the mass   I ASSESSMENT & PLAN:  39 year old gentleman without significant past medical history presents with abdominal bloating, nausea, vomiting, diarrhea, anemia and weight loss. EGD showed a large mass arising from the gastric fundus and extending into GE junction, biopsy showed adenocarcinoma with signet ring features, CT scan is consistent with peritoneal carcinomatosis and bilateral lung metastasis. Ascites cytology was positive for malignant cells.  1. 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 HER-2  negative, MMR-normal -I discussed his surgical biopsy results, cytology results, skin findings with patient and his family members, including his parents and 2 sisters. Unfortunately, this is widely metastatic, not curable, and overall prognosis is very poor. Literature has showed gastric adenocarcinoma with signet ring features has overall worse outcome comparing to others. His life expectancy is probably less than 6 months without treatment. -He has started first-line chemotherapy FOLFOX, tolerated well, and his symptoms have much  improved. -His tumor is negative for HER-2 overexpression, no role for trastuzumab. -We also checked his tumor for MMR, which was normal -[employer] [facility] tonight test reviewed no actionable mutation -His first restaging CT scan showed mild partial response, overall stable disease. His ascites has significantly decreased. Pleural effusion resolved. He is clinically doing well.  -second restaging CT shows stable disease overall, with few lymph nodes slightly bigger, no significant new lesions.  -His tumor marker CEA has came down to normal -I recommend to continue the current FOLFOX chemotherapy. -he is interested in pursuing [facility] in [place], which is a intravenous infusion for total of 2 weeks. Giving the slight disease progression on the scan, i.e. recommends to hold it for now.  -Lab are reviewed, mild thrombocytopenia,  we'll proceed cycle 10 chemotherapy today. His 5-FU dose has been reduced by 5% due to the cytopenia since cycle 9, no 54f blous.  2. Malignant ascites -resolved now after chemo   3. Anorexia, nausea, malnutrition -Nearly resolved, he feels much better lately -His albumin level is still low, the nutrition secondary to malignancy. -I encouraged him to take protein powder and diet supplements as needed.  4. Anemia, multifactorial (iron deficient anemia, cancer related and chemotherapy induced ) -His ferritin level was normal at 171, but he has  low iron and saturation, likely some degree of iron deficient anemia. -He has received IV Feraheme 510 mg twice, 2 weeks apart -Blood transfusion as needed during chemotherapy if her hemoglobin below 8. -Mild anemia has been stable.   Plan #1 cycle 10 FOLFOX today,  No 5-FU bolus, 5-fu dose 5% reduction, no Neulasta on day 3 with this cycle  #2 RTC in 2 weeks for cycle 11 with APP, I will see him back in 4 weeks with cycle 12 -Restaging CT scan after cycle 12  Patient and his family members had many questions. All questions were answered. The patient knows to call the clinic with any problems, questions or concerns.  I spent 20 minutes counseling the patient face to face. The total time spent in the appointment was 25  minutes and more than 50% was on counseling.     FTruitt Merle MD 09/10/2014

## 2014-09-10 NOTE — Telephone Encounter (Signed)
Appointments made per saff message

## 2014-09-10 NOTE — Patient Instructions (Signed)
[facility] Discharge Instructions for Patients Receiving Chemotherapy  Today you received the following chemotherapy agents: Oxaliplatin, Leucovorin, and Adrucil   To help prevent nausea and vomiting after your treatment, we encourage you to take your nausea medication as directed.    If you develop nausea and vomiting that is not controlled by your nausea medication, call the clinic.   BELOW ARE SYMPTOMS THAT SHOULD BE REPORTED IMMEDIATELY:  *FEVER GREATER THAN 100.5 F  *CHILLS WITH OR WITHOUT FEVER  NAUSEA AND VOMITING THAT IS NOT CONTROLLED WITH YOUR NAUSEA MEDICATION  *UNUSUAL SHORTNESS OF BREATH  *UNUSUAL BRUISING OR BLEEDING  TENDERNESS IN MOUTH AND THROAT WITH OR WITHOUT PRESENCE OF ULCERS  *URINARY PROBLEMS  *BOWEL PROBLEMS  UNUSUAL RASH Items with * indicate a potential emergency and should be followed up as soon as possible.  Feel free to call the clinic you have any questions or concerns. The clinic phone number is [phone].  Please show the CHEMO ALERT CARD at check-in to the Emergency Department and triage nurse.   

## 2014-09-12 ENCOUNTER — Ambulatory Visit (HOSPITAL_BASED_OUTPATIENT_CLINIC_OR_DEPARTMENT_OTHER): Payer: BLUE CROSS/BLUE SHIELD

## 2014-09-12 VITALS — BP 139/80 | HR 80 | Temp 97.9°F | Resp 20

## 2014-09-12 DIAGNOSIS — C169 Malignant neoplasm of stomach, unspecified: Secondary | ICD-10-CM | POA: Diagnosis not present

## 2014-09-12 MED ORDER — HEPARIN SOD (PORK) LOCK FLUSH 100 UNIT/ML IV SOLN
500.0000 [IU] | Freq: Once | INTRAVENOUS | Status: AC | PRN
  Administered 2014-09-12: 500 [IU]
  Filled 2014-09-12: qty 5

## 2014-09-12 MED ORDER — SODIUM CHLORIDE 0.9 % IJ SOLN
10.0000 mL | INTRAMUSCULAR | Status: DC | PRN
  Administered 2014-09-12: 10 mL
  Filled 2014-09-12: qty 10

## 2014-09-23 ENCOUNTER — Other Ambulatory Visit (HOSPITAL_BASED_OUTPATIENT_CLINIC_OR_DEPARTMENT_OTHER): Payer: BLUE CROSS/BLUE SHIELD

## 2014-09-23 DIAGNOSIS — C169 Malignant neoplasm of stomach, unspecified: Secondary | ICD-10-CM

## 2014-09-23 LAB — CBC WITH DIFFERENTIAL/PLATELET
BASO%: 1 % (ref 0.0–2.0)
Basophils Absolute: 0 10*3/uL (ref 0.0–0.1)
EOS%: 1.8 % (ref 0.0–7.0)
Eosinophils Absolute: 0.1 10*3/uL (ref 0.0–0.5)
HCT: 39.8 % (ref 38.4–49.9)
HGB: 12.7 g/dL — ABNORMAL LOW (ref 13.0–17.1)
LYMPH%: 27.5 % (ref 14.0–49.0)
MCH: 27.7 pg (ref 27.2–33.4)
MCHC: 32 g/dL (ref 32.0–36.0)
MCV: 86.5 fL (ref 79.3–98.0)
MONO#: 0.7 10*3/uL (ref 0.1–0.9)
MONO%: 16.8 % — AB (ref 0.0–14.0)
NEUT#: 2.3 10*3/uL (ref 1.5–6.5)
NEUT%: 52.9 % (ref 39.0–75.0)
Platelets: 105 10*3/uL — ABNORMAL LOW (ref 140–400)
RBC: 4.61 10*6/uL (ref 4.20–5.82)
RDW: 17.4 % — ABNORMAL HIGH (ref 11.0–14.6)
WBC: 4.4 10*3/uL (ref 4.0–10.3)
lymph#: 1.2 10*3/uL (ref 0.9–3.3)

## 2014-09-23 LAB — COMPREHENSIVE METABOLIC PANEL (CC13)
ALT: 14 U/L (ref 0–55)
AST: 24 U/L (ref 5–34)
Albumin: 2.8 g/dL — ABNORMAL LOW (ref 3.5–5.0)
Alkaline Phosphatase: 140 U/L (ref 40–150)
Anion Gap: 10 mEq/L (ref 3–11)
BILIRUBIN TOTAL: 0.5 mg/dL (ref 0.20–1.20)
BUN: 13.6 mg/dL (ref 7.0–26.0)
CALCIUM: 8.6 mg/dL (ref 8.4–10.4)
CHLORIDE: 108 meq/L (ref 98–109)
CO2: 25 meq/L (ref 22–29)
CREATININE: 0.8 mg/dL (ref 0.7–1.3)
GLUCOSE: 98 mg/dL (ref 70–140)
Potassium: 3.8 mEq/L (ref 3.5–5.1)
Sodium: 143 mEq/L (ref 136–145)
Total Protein: 6.2 g/dL — ABNORMAL LOW (ref 6.4–8.3)

## 2014-09-24 ENCOUNTER — Encounter: Payer: Self-pay | Admitting: Physician Assistant

## 2014-09-24 ENCOUNTER — Ambulatory Visit (HOSPITAL_BASED_OUTPATIENT_CLINIC_OR_DEPARTMENT_OTHER): Payer: BLUE CROSS/BLUE SHIELD

## 2014-09-24 ENCOUNTER — Ambulatory Visit (HOSPITAL_BASED_OUTPATIENT_CLINIC_OR_DEPARTMENT_OTHER): Payer: BLUE CROSS/BLUE SHIELD | Admitting: Physician Assistant

## 2014-09-24 VITALS — BP 131/65 | HR 72 | Temp 98.1°F | Resp 18 | Ht 69.0 in | Wt 288.1 lb

## 2014-09-24 DIAGNOSIS — C7801 Secondary malignant neoplasm of right lung: Secondary | ICD-10-CM

## 2014-09-24 DIAGNOSIS — C786 Secondary malignant neoplasm of retroperitoneum and peritoneum: Secondary | ICD-10-CM | POA: Diagnosis not present

## 2014-09-24 DIAGNOSIS — C169 Malignant neoplasm of stomach, unspecified: Secondary | ICD-10-CM

## 2014-09-24 DIAGNOSIS — R630 Anorexia: Secondary | ICD-10-CM

## 2014-09-24 DIAGNOSIS — C7802 Secondary malignant neoplasm of left lung: Secondary | ICD-10-CM | POA: Diagnosis not present

## 2014-09-24 DIAGNOSIS — D6481 Anemia due to antineoplastic chemotherapy: Secondary | ICD-10-CM

## 2014-09-24 DIAGNOSIS — D509 Iron deficiency anemia, unspecified: Secondary | ICD-10-CM

## 2014-09-24 DIAGNOSIS — D630 Anemia in neoplastic disease: Secondary | ICD-10-CM

## 2014-09-24 DIAGNOSIS — C801 Malignant (primary) neoplasm, unspecified: Secondary | ICD-10-CM

## 2014-09-24 DIAGNOSIS — Z5111 Encounter for antineoplastic chemotherapy: Secondary | ICD-10-CM | POA: Diagnosis not present

## 2014-09-24 DIAGNOSIS — R634 Abnormal weight loss: Secondary | ICD-10-CM

## 2014-09-24 DIAGNOSIS — E46 Unspecified protein-calorie malnutrition: Secondary | ICD-10-CM

## 2014-09-24 DIAGNOSIS — D696 Thrombocytopenia, unspecified: Secondary | ICD-10-CM

## 2014-09-24 MED ORDER — DEXTROSE 5 % IV SOLN
Freq: Once | INTRAVENOUS | Status: AC
  Administered 2014-09-24: 10:00:00 via INTRAVENOUS

## 2014-09-24 MED ORDER — LEUCOVORIN CALCIUM INJECTION 350 MG
400.0000 mg/m2 | Freq: Once | INTRAVENOUS | Status: AC
  Administered 2014-09-24: 996 mg via INTRAVENOUS
  Filled 2014-09-24: qty 49.8

## 2014-09-24 MED ORDER — SODIUM CHLORIDE 0.9 % IV SOLN
2280.0000 mg/m2 | INTRAVENOUS | Status: DC
  Administered 2014-09-24: 5700 mg via INTRAVENOUS
  Filled 2014-09-24: qty 114

## 2014-09-24 MED ORDER — SODIUM CHLORIDE 0.9 % IV SOLN
Freq: Once | INTRAVENOUS | Status: AC
  Administered 2014-09-24: 10:00:00 via INTRAVENOUS
  Filled 2014-09-24: qty 4

## 2014-09-24 MED ORDER — OXALIPLATIN CHEMO INJECTION 100 MG/20ML
80.0000 mg/m2 | Freq: Once | INTRAVENOUS | Status: AC
  Administered 2014-09-24: 200 mg via INTRAVENOUS
  Filled 2014-09-24: qty 40

## 2014-09-24 NOTE — Progress Notes (Signed)
[place]  Telephone:([phone]) [phone] Fax:(336) [phone]  Clinic New Consult Note   Patient Care Team: No Pcp Per Patient as PCP - General (General Practice) 09/24/2014  CHIEF COMPLAINTS Follow up metastatic gastric cancer    Gastric adenocarcinoma: Stage IV   04/23/2014 Imaging CT abdomen showed peritoneal carcinomatosis, ascites, thickning of gastric and GEJ wall, CT chest showed a few small b/l lung nodules.    04/24/2014 Tumor Marker CEA  5.6, CA19.9 13   04/25/2014 Initial Diagnosis Gastric adenocarcinoma: Stage IV   04/25/2014 Pathology Results Stomach mass biopsy showed - ADENOCARCINOMA WITH SIGNET RING FEATURES. ascites cytology (+)   04/25/2014 Procedure EGD: There was a large, nearly circumferential, ulcerated, clearly malignant mass that stradles the GE junction. The vast bulk of the mass lays in the stomach.    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 I saw him first when [facility] He is here for the first office visit after hospital discharge.  He was  admitted on 12/15 with progressive, 4 month history of dysphagia, epigastric abdominal discomfort and bloating, early satiation accompanied by nausea, and postprandial vomiting and diarrhea. He has experienced a 30 lbs weight loss in the process. He has noted increased abdominal girth. Patient denies any shortness of breath, chest pain, GI bleed, hematuria or lower extremity swelling. He denies heavy alcohol intake. Denies tobacco abuse. Denies  risk factors for HIV or hepatitis. He denies any family history of GI malignancies.  CT of the abdomen and pelvis with contrast on 12/15 revealed several pulmonary nodules at the bases bilaterally, profound thickening of the distal esophagus extending beyond the gastroesophageal junction into the proximal stomach,cardia and fundus of the stomach, most evident along the lesser curvature. No pathologic dilatation of small bowel or colon. Extensive upper abdominal and retroperitoneal lymphadenopathy was visualized. Moderate volume of ascites, presumably malignant, was noted. Extensive soft tissue nodularity throughout the omentum, compatible with omental caking and widespread peritoneal metastasis. Additionally, there are other areas of enhancing nodularity along the peritoneal surface. Tiny ventral and umbilical hernias containing predominantly omental fat, including some omental implants. There are no aggressive appearing lytic or blastic lesions noted in the visualized portions of the skeleton.  He underwent an EGD by Dr. JArdis Hughson 04/25/2014, which showed a large fungating mass that straddles the GE junction and occupying the upper 1/3 part of the stomach. Multiple biopsy was taken which showed adenocarcinoma with signet ring features. He also underwent a paracentesis with 4.5 L fluid is removed on 04/26/2014. Cytology was positive for adenocarcinoma with signet ring features, consistent with metastatic disease.  He was discharged to home on 04/27/2014. He did feel a slightly informant of his abdominal bloating after the paracentesis, but not much improvement other symptoms. He had Mediport placement and the second paracentesis was again 4 L fluids removal on 05/02/2014. His appetite is low, he drinks ensure 3 bottles a day, some soup and a very little other foods. His energy level is also low, but able to take care of  all his daily needs, and move around with about much difficulty. His abdominal  discomfort/pain are relatively controlled by morphine 15 mg at night and tramadol 2-3 times during the day. He denies any fever, chills, cough, chest pain or any in the other discomfort.  [employer] genetic test (5/92/9244): NF1 splice site [id number] del 89 TP53 R213*  INTERIM HISTORY: He returns for follow up and cycle 11 chemotherapy. He continues doing well, without any complaints.  He has good energy level, no limitation on his activities. He is eating well, no nausea, abdominal bloating or discomfort. He denied any issues with constipation or diarrhea.  He denies any fever or chills, or any bleeding. He reported some fleeting paresthesia type symptoms in his fingers, is not constant nor does it occur daily. (MEDICAL HISTORY:  Past Medical History  Diagnosis Date  . Medical history non-contributory   . gastric ca dx'd 04/2014    SURGICAL HISTORY: Past Surgical History  Procedure Laterality Date  . Squamous cell carcinoma excision  approx March 2015    removed from top of head  . Esophagogastroduodenoscopy (egd) with propofol N/A 04/25/2014    Procedure: ESOPHAGOGASTRODUODENOSCOPY (EGD) WITH PROPOFOL;  Surgeon: Milus Banister, MD;  Location: [facility] ENDOSCOPY;  Service: Endoscopy;  Laterality: N/A;    SOCIAL HISTORY: History   Social History  . Marital Status: Single    Spouse Name: N/A  . Number of Children: N/A  . Years of Education: N/A   Occupational History  . Not on file.   Social History Main Topics  . Smoking status: Never Smoker   . Smokeless tobacco: Never Used  . Alcohol Use: No  . Drug Use: No  . Sexual Activity: Not on file   Other Topics Concern  . Not on file   Social History Narrative    FAMILY HISTORY: Family History  Problem Relation Age of Onset  . Breast cancer Sister 68   . Hypertension Mother   . Hypertension Father   Maternal aunt breast cancer at age of 50 Maternal cousin ovarian cancer at age of 38 Maternal ancle prostate cancer age  of 68   ALLERGIES:  has No Known Allergies.  MEDICATIONS:  Current Outpatient Prescriptions  Medication Sig Dispense Refill  . lidocaine-prilocaine (EMLA) cream Apply 1 application topically as needed. 30 g 0  . docusate sodium 100 MG CAPS Take 100 mg by mouth 2 (two) times daily. (Patient not taking: Reported on 09/24/2014) 10 capsule 0  . mirtazapine (REMERON) 15 MG tablet Take 1 tablet (15 mg total) by mouth at bedtime. (Patient not taking: Reported on 09/24/2014) 30 tablet 1  . morphine (MS CONTIN) 15 MG 12 hr tablet Take 1 tablet (15 mg total) by mouth every 12 (twelve) hours. (Patient not taking: Reported on 09/24/2014) 60 tablet 0  . ondansetron (ZOFRAN) 4 MG tablet Take 1 tablet (4 mg total) by mouth every 8 (eight) hours as needed for nausea or vomiting. (Patient not taking: Reported on 09/24/2014) 20 tablet 0  . pantoprazole (PROTONIX) 40 MG tablet Take 1 tablet (40 mg total) by mouth daily. (Patient not taking: Reported on 09/24/2014) 30 tablet 0  . prochlorperazine (COMPAZINE) 10 MG tablet Take 1 tablet (10 mg total) by mouth every 6 (six) hours as needed for nausea or vomiting. (Patient not taking: Reported on 09/24/2014) 30 tablet 3  . traMADol (ULTRAM) 50 MG tablet Take 1-2 tablets (50-100 mg total) by mouth every 6 (six) hours as needed for moderate pain. (Patient not  taking: Reported on 09/24/2014) 30 tablet 0   No current facility-administered medications for this visit.   Facility-Administered Medications Ordered in Other Visits  Medication Dose Route Frequency Provider Last Rate Last Dose  . fluorouracil (ADRUCIL) 5,700 mg in sodium chloride 0.9 % 136 mL chemo infusion  2,280 mg/m2 (Treatment Plan Actual) Intravenous 1 day or 1 dose Truitt Merle, MD      . leucovorin 996 mg in dextrose 5 % 250 mL infusion  400 mg/m2 (Treatment Plan Actual) Intravenous Once Truitt Merle, MD      . oxaliplatin (ELOXATIN) 200 mg in dextrose 5 % 500 mL chemo infusion  80 mg/m2 (Treatment Plan Actual)  Intravenous Once Truitt Merle, MD        REVIEW OF SYSTEMS:   Constitutional: Denies fevers, chills or abnormal night sweats, (+) weight loss and anorexia Eyes: Denies blurriness of vision, double vision or watery eyes Ears, nose, mouth, throat, and face: Denies mucositis or sore throat Respiratory: Denies cough, dyspnea or wheezes Cardiovascular: Denies palpitation, chest discomfort or lower extremity swelling Gastrointestinal:  Less nausea, abdominal bloating and discomfort Skin: Denies abnormal skin rashes Lymphatics: Denies new lymphadenopathy or easy bruising Neurological:Denies numbness, new weaknesses. Notes very fleeting tingling in the fingertips Behavioral/Psych: Mood is stable, no new changes  All other systems were reviewed with the patient and are negative except those mentioned in the history.  PHYSICAL EXAMINATION: ECOG PERFORMANCE STATUS: 1  GENERAL:alert, no distress and comfortable SKIN: skin color, texture, turgor are normal, no rashes or significant lesions EYES: normal, conjunctiva are pink and non-injected, sclera clear OROPHARYNX:no exudate, no erythema and lips, buccal mucosa, and tongue normal  NECK: supple, thyroid normal size, non-tender, without nodularity LYMPH:  no palpable lymphadenopathy in the cervical, axillary or inguinal LUNGS: clear to auscultation and percussion with normal breathing effort HEART: regular rate & rhythm and no murmurs and no lower extremity edema ABDOMEN:abdomen soft, non-tender and normal bowel sounds, probable small amount of ascites. Musculoskeletal:no cyanosis of digits and no clubbing  PSYCH: alert & oriented x 3 with fluent speech NEURO: no focal motor/sensory deficits  LABORATORY DATA:  I have reviewed the data as listed CBC Latest Ref Rng 09/23/2014 09/09/2014 08/26/2014  WBC 4.0 - 10.3 10e3/uL 4.4 4.7 7.1  Hemoglobin 13.0 - 17.1 g/dL 12.7(L) 12.4(L) 12.7(L)  Hematocrit 38.4 - 49.9 % 39.8 38.9 40.1  Platelets 140 - 400  10e3/uL 105(L) 135(L) 104(L)    CMP Latest Ref Rng 09/23/2014 09/09/2014 08/26/2014  Glucose 70 - 140 mg/dl 98 122 107  BUN 7.0 - 26.0 mg/dL 13.6 16.5 13.6  Creatinine 0.7 - 1.3 mg/dL 0.8 0.7 0.8  Sodium 136 - 145 mEq/L 143 142 141  Potassium 3.5 - 5.1 mEq/L 3.8 3.8 3.9  Chloride 96 - 112 mEq/L - - -  CO2 22 - 29 mEq/L 25 24 23   Calcium 8.4 - 10.4 mg/dL 8.6 8.8 8.8  Total Protein 6.4 - 8.3 g/dL 6.2(L) 6.4 6.7  Total Bilirubin 0.20 - 1.20 mg/dL 0.50 0.41 0.35  Alkaline Phos 40 - 150 U/L 140 143 169(H)  AST 5 - 34 U/L 24 18 18   ALT 0 - 55 U/L 14 12 14      Pathology report  Diagnosis  PERITONEAL/ASCITIC FLUID(SPECIMEN 1 OF 1 COLLECTED 04/26/14): METASTATIC ADENOCARCINOMA WITH SIGNET RING CELL FEATURES.  Stomach, biopsy, r/o cancer 04/25/14 - ADENOCARCINOMA WITH SIGNET RING FEATURES. Microscopic Comment The results were called to Dr. Ardis Hughs on 04/26/2014. (JDP:kh 04/26/14) Claudette Laws MD Pathologist, Electronic Signature (Case  signed 04/26/2014)  HER-2/NEU BY CISH - NO AMPLIFICATION OF HER-2 DETECTED. RESULT RATIO OF HER2: CEP 17 SIGNALS 1.20 AVERAGE HER2 COPY NUMBER PER CELL 2.10  ADDITIONAL INFORMATION: Mismatch Repair (MMR) Protein Immunohistochemistry (IHC) IHC Expression Result: MLH1: Preserved nuclear expression (greater 50% tumor expression) MSH2: Preserved nuclear expression (greater 50% tumor expression) MSH6: Preserved nuclear expression (greater 50% tumor expression) PMS2: Preserved nuclear expression (greater 50% tumor expression) * Internal control demonstrates intact nuclear expression Interpretation: NORMAL There is preserved expression of the major and minor MMR proteins. There is a very low probability that microsatellite instability (MSI) is present. However, certain clinically significant MMR protein mutations may result in preservation of nuclear expression. It is recommended that the preservation of protein expression be correlated with molecular based  MSI testing.  [facility] [employer] testing: (+) SH70 and NF1   RADIOGRAPHIC STUDIES: I have personally reviewed the radiological images as listed and agreed with the findings in the report.  Ct Chest, abdomen and pelvis Wo Contrast12/24/2015    IMPRESSION:  1. Findings, as above, highly concerning for either esophageal or gastric primary malignancy, with metastatic lymphadenopathy in the upper abdomen and retroperitoneum, widespread intraperitoneal spread of disease, malignant ascites, and multiple pulmonary nodules in the visualize lung bases concerning for pulmonary metastasis. Oncologic evaluation is recommended.  2. Additional incidental findings, as above. These results were called by telephone at the time of interpretation on 04/23/2014 at 7:53 pm to [place] , who verbally acknowledged these results.   Electronically Signed   By: Vinnie Langton M.D.   On: 04/23/2014 19:56   CT CAP 06/24/2014 IMPRESSION: 1. Progressive low density and calcifications within the extensive upper abdominal lymphadenopathy, likely related to partial treatment. Some of the lymph nodes do appear slightly larger. The irregular infiltrating mass involving the gastric cardia appears slightly improved. 2. No evidence of hepatic metastatic disease. 3. Overall volume of ascites has improved, although extensive changes of peritoneal carcinomatosis have not significantly changed. 4. No significant change in bilateral pulmonary nodules consistent with metastatic disease. 5. Resolution of bilateral pleural effusions.  CT chest, abdomen and pelvis with contrast on 08/26/2014 IMPRESSION: Overall stable to slight progression of disease. Existing pulmonary nodules are stable to slightly progressed with no new pulmonary nodules evident. The bulky upper abdominal lymphadenopathy is stable to minimally progressed while no substantial change in the primary infiltrating gastric neoplasm is discernible.  Omental disease appears stable. There is slightly more ascites on today's study.   EGD WITH BIOPSY 04/25/2014 ENDOSCOPIC IMPRESSION: There was a large, nearly circumferential, ulcerated, clearly malignant mass that stradles the GE junction. The vast bulk of the mass lays in the stomach, occupying about 1/3 of the stomach. The mass extends into the esophagus for about 4cm above the GE junction. Mutiple biopsies were taken from the gastric portion of the mass   I ASSESSMENT & PLAN:  39 year old gentleman without significant past medical history presents with abdominal bloating, nausea, vomiting, diarrhea, anemia and weight loss. EGD showed a large mass arising from the gastric fundus and extending into GE junction, biopsy showed adenocarcinoma with signet ring features, CT scan is consistent with peritoneal carcinomatosis and bilateral lung metastasis. Ascites cytology was positive for malignant cells.  1. 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 HER-2 negative, MMR-normal -I discussed his surgical biopsy results, cytology results, skin findings with patient and his family members, including his parents and 2 sisters. Unfortunately, this is widely metastatic, not curable, and  overall prognosis is very poor. Literature has showed gastric adenocarcinoma with signet ring features has overall worse outcome comparing to others. His life expectancy is probably less than 6 months without treatment. -He has started first-line chemotherapy FOLFOX, tolerated well, and his symptoms have much  improved. -His tumor is negative for HER-2 overexpression, no role for trastuzumab. -We also checked his tumor for MMR, which was normal -[facility] tonight test reviewed no actionable mutation -His first restaging CT scan showed mild partial response, overall stable disease. His ascites has significantly decreased. Pleural effusion resolved. He is clinically  doing well.  -second restaging CT shows stable disease overall, with few lymph nodes slightly bigger, no significant new lesions.  -His tumor marker CEA has came down to normal -I recommend to continue the current FOLFOX chemotherapy. -he is interested in pursuing [place] in [place], which is a intravenous infusion for total of 2 weeks. Giving the slight disease progression on the scan, i.e. recommends to hold it for now.  -Lab are reviewed, mild thrombocytopenia, we'll proceed cycle 10 chemotherapy today. His 5-FU dose has been reduced by 5% due to the cytopenia since cycle 9, no 24f blous.  2. Malignant ascites -resolved now after chemo   3. Anorexia, nausea, malnutrition -Nearly resolved, he feels much better lately -His albumin level is still low, the nutrition secondary to malignancy. -I encouraged him to take protein powder and diet supplements as needed.  4. Anemia, multifactorial (iron deficient anemia, cancer related and chemotherapy induced ) -His ferritin level was normal at 171, but he has low iron and saturation, likely some degree of iron deficient anemia. -He has received IV Feraheme 510 mg twice, 2 weeks apart -Blood transfusion as needed during chemotherapy if her hemoglobin below 8. -Mild anemia has been stable.   Plan #1 cycle 11 FOLFOX today,  No 5-FU bolus, 5-fu dose 5% reduction, no Neulasta on day 3 with this cycle  #2 RTC in 2 weeks for cycle 12  -Restaging CT scan after cycle 12  The patient knows to call the clinic with any problems, questions or concerns.  I spent 20 minutes counseling the patient face to face. The total time spent in the appointment was 30  minutes and more than 50% was on counseling.     JCarlton Adam PA-C 09/24/2014

## 2014-09-24 NOTE — Patient Instructions (Signed)
[facility] Discharge Instructions for Patients Receiving Chemotherapy  Today you received the following chemotherapy agents Oxaliplatin/Leucovorin/Fluorouricil.  To help prevent nausea and vomiting after your treatment, we encourage you to take your nausea medication as directed.   If you develop nausea and vomiting that is not controlled by your nausea medication, call the clinic.   BELOW ARE SYMPTOMS THAT SHOULD BE REPORTED IMMEDIATELY:  *FEVER GREATER THAN 100.5 F  *CHILLS WITH OR WITHOUT FEVER  NAUSEA AND VOMITING THAT IS NOT CONTROLLED WITH YOUR NAUSEA MEDICATION  *UNUSUAL SHORTNESS OF BREATH  *UNUSUAL BRUISING OR BLEEDING  TENDERNESS IN MOUTH AND THROAT WITH OR WITHOUT PRESENCE OF ULCERS  *URINARY PROBLEMS  *BOWEL PROBLEMS  UNUSUAL RASH Items with * indicate a potential emergency and should be followed up as soon as possible.  Feel free to call the clinic you have any questions or concerns. The clinic phone number is [phone].  Please show the CHEMO ALERT CARD at check-in to the Emergency Department and triage nurse.    

## 2014-09-24 NOTE — Patient Instructions (Signed)
Follow-up in 2 weeks prior to your next scheduled cycle of chemotherapy

## 2014-09-26 ENCOUNTER — Ambulatory Visit (HOSPITAL_BASED_OUTPATIENT_CLINIC_OR_DEPARTMENT_OTHER): Payer: BLUE CROSS/BLUE SHIELD

## 2014-09-26 VITALS — BP 132/83 | HR 76 | Temp 98.0°F

## 2014-09-26 DIAGNOSIS — C786 Secondary malignant neoplasm of retroperitoneum and peritoneum: Secondary | ICD-10-CM

## 2014-09-26 DIAGNOSIS — C169 Malignant neoplasm of stomach, unspecified: Secondary | ICD-10-CM | POA: Diagnosis not present

## 2014-09-26 DIAGNOSIS — C7801 Secondary malignant neoplasm of right lung: Secondary | ICD-10-CM

## 2014-09-26 DIAGNOSIS — C7802 Secondary malignant neoplasm of left lung: Secondary | ICD-10-CM | POA: Diagnosis not present

## 2014-09-26 MED ORDER — HEPARIN SOD (PORK) LOCK FLUSH 100 UNIT/ML IV SOLN
500.0000 [IU] | Freq: Once | INTRAVENOUS | Status: AC | PRN
  Administered 2014-09-26: 500 [IU]
  Filled 2014-09-26: qty 5

## 2014-09-26 MED ORDER — SODIUM CHLORIDE 0.9 % IJ SOLN
10.0000 mL | INTRAMUSCULAR | Status: DC | PRN
  Administered 2014-09-26: 10 mL
  Filled 2014-09-26: qty 10

## 2014-09-26 NOTE — Patient Instructions (Signed)
Fluorouracil, 5-FU injection What is this medicine? FLUOROURACIL, 5-FU (flure oh YOOR a sil) is a chemotherapy drug. It slows the growth of cancer cells. This medicine is used to treat many types of cancer like breast cancer, colon or rectal cancer, pancreatic cancer, and stomach cancer. This medicine may be used for other purposes; ask your health care provider or pharmacist if you have questions. COMMON BRAND NAME(S): Adrucil What should I tell my health care provider before I take this medicine? They need to know if you have any of these conditions: -blood disorders -dihydropyrimidine dehydrogenase (DPD) deficiency -infection (especially a virus infection such as chickenpox, cold sores, or herpes) -kidney disease -liver disease -malnourished, poor nutrition -recent or ongoing radiation therapy -an unusual or allergic reaction to fluorouracil, other chemotherapy, other medicines, foods, dyes, or preservatives -pregnant or trying to get pregnant -breast-feeding How should I use this medicine? This drug is given as an infusion or injection into a vein. It is administered in a hospital or clinic by a specially trained health care professional. Talk to your pediatrician regarding the use of this medicine in children. Special care may be needed. Overdosage: If you think you have taken too much of this medicine contact a poison control center or emergency room at once. NOTE: This medicine is only for you. Do not share this medicine with others. What if I miss a dose? It is important not to miss your dose. Call your doctor or health care professional if you are unable to keep an appointment. What may interact with this medicine? -allopurinol -cimetidine -dapsone -digoxin -hydroxyurea -leucovorin -levamisole -medicines for seizures like ethotoin, fosphenytoin, phenytoin -medicines to increase blood counts like filgrastim, pegfilgrastim, sargramostim -medicines that treat or prevent blood  clots like warfarin, enoxaparin, and dalteparin -methotrexate -metronidazole -pyrimethamine -some other chemotherapy drugs like busulfan, cisplatin, estramustine, vinblastine -trimethoprim -trimetrexate -vaccines Talk to your doctor or health care professional before taking any of these medicines: -acetaminophen -aspirin -ibuprofen -ketoprofen -naproxen This list may not describe all possible interactions. Give your health care provider a list of all the medicines, herbs, non-prescription drugs, or dietary supplements you use. Also tell them if you smoke, drink alcohol, or use illegal drugs. Some items may interact with your medicine. What should I watch for while using this medicine? Visit your doctor for checks on your progress. This drug may make you feel generally unwell. This is not uncommon, as chemotherapy can affect healthy cells as well as cancer cells. Report any side effects. Continue your course of treatment even though you feel ill unless your doctor tells you to stop. In some cases, you may be given additional medicines to help with side effects. Follow all directions for their use. Call your doctor or health care professional for advice if you get a fever, chills or sore throat, or other symptoms of a cold or flu. Do not treat yourself. This drug decreases your body's ability to fight infections. Try to avoid being around people who are sick. This medicine may increase your risk to bruise or bleed. Call your doctor or health care professional if you notice any unusual bleeding. Be careful brushing and flossing your teeth or using a toothpick because you may get an infection or bleed more easily. If you have any dental work done, tell your dentist you are receiving this medicine. Avoid taking products that contain aspirin, acetaminophen, ibuprofen, naproxen, or ketoprofen unless instructed by your doctor. These medicines may hide a fever. Do not become pregnant while taking this    medicine. Women should inform their doctor if they wish to become pregnant or think they might be pregnant. There is a potential for serious side effects to an unborn child. Talk to your health care professional or pharmacist for more information. Do not breast-feed an infant while taking this medicine. Men should inform their doctor if they wish to father a child. This medicine may lower sperm counts. Do not treat diarrhea with over the counter products. Contact your doctor if you have diarrhea that lasts more than 2 days or if it is severe and watery. This medicine can make you more sensitive to the sun. Keep out of the sun. If you cannot avoid being in the sun, wear protective clothing and use sunscreen. Do not use sun lamps or tanning beds/booths. What side effects may I notice from receiving this medicine? Side effects that you should report to your doctor or health care professional as soon as possible: -allergic reactions like skin rash, itching or hives, swelling of the face, lips, or tongue -low blood counts - this medicine may decrease the number of white blood cells, red blood cells and platelets. You may be at increased risk for infections and bleeding. -signs of infection - fever or chills, cough, sore throat, pain or difficulty passing urine -signs of decreased platelets or bleeding - bruising, pinpoint red spots on the skin, black, tarry stools, blood in the urine -signs of decreased red blood cells - unusually weak or tired, fainting spells, lightheadedness -breathing problems -changes in vision -chest pain -mouth sores -nausea and vomiting -pain, swelling, redness at site where injected -pain, tingling, numbness in the hands or feet -redness, swelling, or sores on hands or feet -stomach pain -unusual bleeding Side effects that usually do not require medical attention (report to your doctor or health care professional if they continue or are bothersome): -changes in finger or  toe nails -diarrhea -dry or itchy skin -hair loss -headache -loss of appetite -sensitivity of eyes to the light -stomach upset -unusually teary eyes This list may not describe all possible side effects. Call your doctor for medical advice about side effects. You may report side effects to FDA at 1-800-FDA-1088. Where should I keep my medicine? This drug is given in a hospital or clinic and will not be stored at home. NOTE: This sheet is a summary. It may not cover all possible information. If you have questions about this medicine, talk to your doctor, pharmacist, or health care provider.  2015, Elsevier/Gold Standard. (2007-08-30 13:53:16)   

## 2014-09-30 ENCOUNTER — Telehealth: Payer: Self-pay | Admitting: Hematology

## 2014-09-30 NOTE — Telephone Encounter (Signed)
Faxed pt medical records to MD Ouida Sills [phone]

## 2014-10-08 ENCOUNTER — Other Ambulatory Visit (HOSPITAL_BASED_OUTPATIENT_CLINIC_OR_DEPARTMENT_OTHER): Payer: BLUE CROSS/BLUE SHIELD

## 2014-10-08 ENCOUNTER — Encounter: Payer: Self-pay | Admitting: Hematology

## 2014-10-08 ENCOUNTER — Telehealth: Payer: Self-pay | Admitting: Hematology

## 2014-10-08 ENCOUNTER — Ambulatory Visit (HOSPITAL_BASED_OUTPATIENT_CLINIC_OR_DEPARTMENT_OTHER): Payer: BLUE CROSS/BLUE SHIELD | Admitting: Hematology

## 2014-10-08 ENCOUNTER — Ambulatory Visit (HOSPITAL_BASED_OUTPATIENT_CLINIC_OR_DEPARTMENT_OTHER): Payer: BLUE CROSS/BLUE SHIELD

## 2014-10-08 VITALS — BP 128/70 | HR 98 | Temp 97.8°F | Resp 19 | Ht 69.0 in | Wt 287.0 lb

## 2014-10-08 DIAGNOSIS — C169 Malignant neoplasm of stomach, unspecified: Secondary | ICD-10-CM | POA: Diagnosis not present

## 2014-10-08 DIAGNOSIS — C7802 Secondary malignant neoplasm of left lung: Secondary | ICD-10-CM

## 2014-10-08 DIAGNOSIS — D509 Iron deficiency anemia, unspecified: Secondary | ICD-10-CM

## 2014-10-08 DIAGNOSIS — C7801 Secondary malignant neoplasm of right lung: Secondary | ICD-10-CM

## 2014-10-08 DIAGNOSIS — Z5111 Encounter for antineoplastic chemotherapy: Secondary | ICD-10-CM | POA: Diagnosis not present

## 2014-10-08 DIAGNOSIS — D6481 Anemia due to antineoplastic chemotherapy: Secondary | ICD-10-CM

## 2014-10-08 DIAGNOSIS — R110 Nausea: Secondary | ICD-10-CM

## 2014-10-08 DIAGNOSIS — D638 Anemia in other chronic diseases classified elsewhere: Secondary | ICD-10-CM

## 2014-10-08 DIAGNOSIS — C786 Secondary malignant neoplasm of retroperitoneum and peritoneum: Secondary | ICD-10-CM | POA: Diagnosis not present

## 2014-10-08 DIAGNOSIS — R630 Anorexia: Secondary | ICD-10-CM

## 2014-10-08 DIAGNOSIS — R634 Abnormal weight loss: Secondary | ICD-10-CM

## 2014-10-08 LAB — CBC WITH DIFFERENTIAL/PLATELET
BASO%: 0.8 % (ref 0.0–2.0)
BASOS ABS: 0 10*3/uL (ref 0.0–0.1)
EOS%: 1.5 % (ref 0.0–7.0)
Eosinophils Absolute: 0.1 10*3/uL (ref 0.0–0.5)
HCT: 37.8 % — ABNORMAL LOW (ref 38.4–49.9)
HGB: 12.4 g/dL — ABNORMAL LOW (ref 13.0–17.1)
LYMPH#: 1.2 10*3/uL (ref 0.9–3.3)
LYMPH%: 30 % (ref 14.0–49.0)
MCH: 28.7 pg (ref 27.2–33.4)
MCHC: 32.7 g/dL (ref 32.0–36.0)
MCV: 87.6 fL (ref 79.3–98.0)
MONO#: 0.9 10*3/uL (ref 0.1–0.9)
MONO%: 20.9 % — AB (ref 0.0–14.0)
NEUT%: 46.8 % (ref 39.0–75.0)
NEUTROS ABS: 1.9 10*3/uL (ref 1.5–6.5)
Platelets: 137 10*3/uL — ABNORMAL LOW (ref 140–400)
RBC: 4.32 10*6/uL (ref 4.20–5.82)
RDW: 18.1 % — AB (ref 11.0–14.6)
WBC: 4.1 10*3/uL (ref 4.0–10.3)

## 2014-10-08 LAB — COMPREHENSIVE METABOLIC PANEL (CC13)
ALBUMIN: 2.9 g/dL — AB (ref 3.5–5.0)
ALK PHOS: 137 U/L (ref 40–150)
ALT: 13 U/L (ref 0–55)
AST: 25 U/L (ref 5–34)
Anion Gap: 9 mEq/L (ref 3–11)
BUN: 17.9 mg/dL (ref 7.0–26.0)
CO2: 25 mEq/L (ref 22–29)
CREATININE: 0.8 mg/dL (ref 0.7–1.3)
Calcium: 8.5 mg/dL (ref 8.4–10.4)
Chloride: 107 mEq/L (ref 98–109)
Glucose: 98 mg/dl (ref 70–140)
Potassium: 4.1 mEq/L (ref 3.5–5.1)
SODIUM: 142 meq/L (ref 136–145)
Total Bilirubin: 0.61 mg/dL (ref 0.20–1.20)
Total Protein: 6.6 g/dL (ref 6.4–8.3)

## 2014-10-08 LAB — CEA: CEA: 1.8 ng/mL (ref 0.0–5.0)

## 2014-10-08 MED ORDER — PROCHLORPERAZINE MALEATE 10 MG PO TABS: 10.0000 mg | ORAL_TABLET | Freq: Four times a day (QID) | ORAL | Status: DC | PRN

## 2014-10-08 MED ORDER — DEXTROSE 5 % IV SOLN
Freq: Once | INTRAVENOUS | Status: AC
  Administered 2014-10-08: 11:00:00 via INTRAVENOUS

## 2014-10-08 MED ORDER — LEUCOVORIN CALCIUM INJECTION 350 MG
400.0000 mg/m2 | Freq: Once | INTRAVENOUS | Status: AC
  Administered 2014-10-08: 996 mg via INTRAVENOUS
  Filled 2014-10-08: qty 49.8

## 2014-10-08 MED ORDER — DEXTROSE 5 % IV SOLN
80.0000 mg/m2 | Freq: Once | INTRAVENOUS | Status: AC
  Administered 2014-10-08: 200 mg via INTRAVENOUS
  Filled 2014-10-08: qty 40

## 2014-10-08 MED ORDER — HEPARIN SOD (PORK) LOCK FLUSH 100 UNIT/ML IV SOLN
500.0000 [IU] | Freq: Once | INTRAVENOUS | Status: DC | PRN
  Filled 2014-10-08: qty 5

## 2014-10-08 MED ORDER — SODIUM CHLORIDE 0.9 % IV SOLN
Freq: Once | INTRAVENOUS | Status: AC
  Administered 2014-10-08: 11:00:00 via INTRAVENOUS
  Filled 2014-10-08: qty 4

## 2014-10-08 MED ORDER — SODIUM CHLORIDE 0.9 % IJ SOLN
10.0000 mL | INTRAMUSCULAR | Status: DC | PRN
  Filled 2014-10-08: qty 10

## 2014-10-08 MED ORDER — SODIUM CHLORIDE 0.9 % IV SOLN
2280.0000 mg/m2 | INTRAVENOUS | Status: DC
  Administered 2014-10-08: 5700 mg via INTRAVENOUS
  Filled 2014-10-08: qty 114

## 2014-10-08 NOTE — Telephone Encounter (Signed)
Gave avs & calendar for June. Sent MD message to confirm appointment.

## 2014-10-08 NOTE — Progress Notes (Signed)
[facility]  Telephone:([phone] [phone]  Clinic New Consult Note   Patient Care Team: No Pcp Per Patient as PCP - General (General Practice) 10/08/2014  CHIEF COMPLAINTS Follow up metastatic gastric cancer    Gastric adenocarcinoma: Stage IV   04/23/2014 Imaging CT abdomen showed peritoneal carcinomatosis, ascites, thickning of gastric and GEJ wall, CT chest showed a few small b/l lung nodules.    04/24/2014 Tumor Marker CEA  5.6, CA19.9 13   04/25/2014 Initial Diagnosis Gastric adenocarcinoma: Stage IV   04/25/2014 Pathology Results Stomach mass biopsy showed - ADENOCARCINOMA WITH SIGNET RING FEATURES. ascites cytology (+)   04/25/2014 Procedure EGD: There was a large, nearly circumferential, ulcerated, clearly malignant mass that stradles the GE junction. The vast bulk of the mass lays in the stomach.    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 I saw him first when he was recently [facility] He is here for the first office visit after hospital discharge.  He was  admitted on 12/15 with progressive, 4 month history of dysphagia, epigastric abdominal discomfort and bloating, early satiation accompanied by nausea, and postprandial vomiting and diarrhea. He has experienced a 30 lbs weight loss in the process. He has noted increased abdominal girth. Patient denies any shortness of breath, chest pain, GI bleed, hematuria or lower extremity swelling. He denies heavy alcohol intake. Denies tobacco abuse. Denies  risk factors for HIV or hepatitis. He denies any family history of GI malignancies.  CT of the abdomen and pelvis with contrast on 12/15 revealed several pulmonary nodules at the bases bilaterally, profound thickening of the distal esophagus extending beyond the gastroesophageal junction into the proximal stomach,cardia and fundus of the stomach, most evident along the lesser curvature. No pathologic dilatation of small bowel or colon. Extensive upper abdominal and retroperitoneal lymphadenopathy was visualized. Moderate volume of ascites, presumably malignant, was noted. Extensive soft tissue nodularity throughout the omentum, compatible with omental caking and widespread peritoneal metastasis. Additionally, there are other areas of enhancing nodularity along the peritoneal surface. Tiny ventral and umbilical hernias containing predominantly omental fat, including some omental implants. There are no aggressive appearing lytic or blastic lesions noted in the visualized portions of the skeleton.  He underwent an EGD by Dr. JArdis Hughson 04/25/2014, which showed a large fungating mass that straddles the GE junction and occupying the upper 1/3 part of the stomach. Multiple biopsy was taken which showed adenocarcinoma with signet ring features. He also underwent a paracentesis with 4.5 L fluid is removed on 04/26/2014. Cytology was positive for adenocarcinoma with signet ring features, consistent with metastatic disease.  He was discharged to home on 04/27/2014. He did feel a slightly informant of his abdominal bloating after the paracentesis, but not much improvement other symptoms. He had Mediport placement and the second paracentesis was again 4 L fluids removal on 05/02/2014. His appetite is low, he drinks ensure 3 bottles a day, some soup and a very little other foods. His energy level is also low, but able to take care of  all his daily needs, and move around with about much difficulty. His abdominal  discomfort/pain are relatively controlled by morphine 15 mg at night and tramadol 2-3 times during the day. He denies any fever, chills, cough, chest pain or any in the other discomfort.  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 He continues doing well, without any complaints. He has good appetite and energy level, tolerating chemotherapy very well, no noticeable side effects. He denies any fever or chills, no bleeding, bowel movement is normal, he tolerates all routine daily activities without any difficulty. He has scheduled to see a medical oncologist at [facility] in a few weeks for second opinion.   MEDICAL HISTORY:  Past Medical History  Diagnosis Date  . Medical history non-contributory   . gastric ca dx'd 04/2014    SURGICAL HISTORY: Past Surgical History  Procedure Laterality Date  . Squamous cell carcinoma excision  approx March 2015    removed from top of head  . Esophagogastroduodenoscopy (egd) with propofol N/A 04/25/2014    Procedure: ESOPHAGOGASTRODUODENOSCOPY (EGD) WITH PROPOFOL;  Surgeon: Milus Banister, MD;  Location: [facility];  Service: Endoscopy;  Laterality: N/A;    SOCIAL HISTORY: History   Social History  . Marital Status: Single    Spouse Name: N/A  . Number of Children: N/A  . Years of Education: N/A   Occupational History  . Not on file.   Social History Main Topics  . Smoking status: Never Smoker   . Smokeless tobacco: Never Used  . Alcohol Use: No  . Drug Use: No  . Sexual Activity: Not on file   Other Topics Concern  . Not on file   Social History Narrative    FAMILY HISTORY: Family History  Problem Relation Age of Onset  . Breast cancer Sister 50   . Hypertension Mother   . Hypertension Father   Maternal aunt breast cancer at age of 53 Maternal cousin ovarian cancer at age of 56 Maternal ancle prostate cancer  age of 13   ALLERGIES:  has No Known Allergies.  MEDICATIONS:  Current Outpatient Prescriptions  Medication Sig Dispense Refill  . docusate sodium 100 MG CAPS Take 100 mg by mouth 2 (two) times daily. (Patient not taking: Reported on 09/24/2014) 10 capsule 0  . lidocaine-prilocaine (EMLA) cream Apply 1 application topically as needed. 30 g 0  . mirtazapine (REMERON) 15 MG tablet Take 1 tablet (15 mg total) by mouth at bedtime. (Patient not taking: Reported on 09/24/2014) 30 tablet 1  . morphine (MS CONTIN) 15 MG 12 hr tablet Take 1 tablet (15 mg total) by mouth every 12 (twelve) hours. (Patient not taking: Reported on 09/24/2014) 60 tablet 0  . ondansetron (ZOFRAN) 4 MG tablet Take 1 tablet (4 mg total) by mouth every 8 (eight) hours as needed for nausea or vomiting. (Patient not taking: Reported on 09/24/2014) 20 tablet 0  . pantoprazole (PROTONIX) 40 MG tablet Take 1 tablet (40 mg total) by mouth daily. (Patient not taking: Reported on 09/24/2014) 30 tablet 0  . prochlorperazine (COMPAZINE) 10 MG tablet Take 1 tablet (10 mg total) by mouth every 6 (six) hours as needed for nausea or vomiting. (Patient not taking: Reported on 09/24/2014) 30 tablet 3  . traMADol (ULTRAM) 50 MG tablet Take 1-2 tablets (50-100 mg total) by mouth every 6 (six) hours as needed for moderate pain. (Patient not taking:  Reported on 09/24/2014) 30 tablet 0   No current facility-administered medications for this visit.    REVIEW OF SYSTEMS:   Constitutional: Denies fevers, chills or abnormal night sweats, (+) weight loss and anorexia Eyes: Denies blurriness of vision, double vision or watery eyes Ears, nose, mouth, throat, and face: Denies mucositis or sore throat Respiratory: Denies cough, dyspnea or wheezes Cardiovascular: Denies palpitation, chest discomfort or lower extremity swelling Gastrointestinal:  Less nausea, abdominal bloating and discomfort Skin: Denies abnormal skin rashes Lymphatics: Denies new  lymphadenopathy or easy bruising Neurological:Denies numbness, tingling or new weaknesses Behavioral/Psych: Mood is stable, no new changes  All other systems were reviewed with the patient and are negative except those mentioned in the history.  PHYSICAL EXAMINATION: ECOG PERFORMANCE STATUS: 1  GENERAL:alert, no distress and comfortable SKIN: skin color, texture, turgor are normal, no rashes or significant lesions EYES: normal, conjunctiva are pink and non-injected, sclera clear OROPHARYNX:no exudate, no erythema and lips, buccal mucosa, and tongue normal  NECK: supple, thyroid normal size, non-tender, without nodularity LYMPH:  no palpable lymphadenopathy in the cervical, axillary or inguinal LUNGS: clear to auscultation and percussion with normal breathing effort HEART: regular rate & rhythm and no murmurs and no lower extremity edema ABDOMEN:abdomen soft, non-tender and normal bowel sounds, probable small amount of ascites. Musculoskeletal:no cyanosis of digits and no clubbing  PSYCH: alert & oriented x 3 with fluent speech NEURO: no focal motor/sensory deficits  LABORATORY DATA:  I have reviewed the data as listed CBC Latest Ref Rng 10/08/2014 09/23/2014 09/09/2014  WBC 4.0 - 10.3 10e3/uL 4.1 4.4 4.7  Hemoglobin 13.0 - 17.1 g/dL 12.4(L) 12.7(L) 12.4(L)  Hematocrit 38.4 - 49.9 % 37.8(L) 39.8 38.9  Platelets 140 - 400 10e3/uL 137(L) 105(L) 135(L)      CMP Latest Ref Rng 10/08/2014 09/23/2014 09/09/2014  Glucose 70 - 140 mg/dl 98 98 122  BUN 7.0 - 26.0 mg/dL 17.9 13.6 16.5  Creatinine 0.7 - 1.3 mg/dL 0.8 0.8 0.7  Sodium 136 - 145 mEq/L 142 143 142  Potassium 3.5 - 5.1 mEq/L 4.1 3.8 3.8  Chloride 96 - 112 mEq/L - - -  CO2 22 - 29 mEq/L 25 25 24   Calcium 8.4 - 10.4 mg/dL 8.5 8.6 8.8  Total Protein 6.4 - 8.3 g/dL 6.6 6.2(L) 6.4  Total Bilirubin 0.20 - 1.20 mg/dL 0.61 0.50 0.41  Alkaline Phos 40 - 150 U/L 137 140 143  AST 5 - 34 U/L 25 24 18   ALT 0 - 55 U/L 13 14 12      CEA    Status: Finalresult Visible to patient:  MyChart Nextappt: Today at 08:45 AM in [facility] 4) Dx:  Gastric adenocarcinoma: Stage IV           Ref Range 37moago  540mogo     CEA 0.0 - 5.0 ng/mL 1.7 5.6 (H)CM          Pathology report  Diagnosis  PERITONEAL/ASCITIC FLUID(SPECIMEN 1 OF 1 COLLECTED 04/26/14): METASTATIC ADENOCARCINOMA WITH SIGNET RING CELL FEATURES.  Stomach, biopsy, r/o cancer 04/25/14 - ADENOCARCINOMA WITH SIGNET RING FEATURES. Microscopic Comment The results were called to Dr. JaArdis Hughsn 04/26/2014. (JDP:kh 04/26/14) JOClaudette LawsD Pathologist, Electronic Signature (Case signed 04/26/2014)  HER-2/NEU BY CISH - NO AMPLIFICATION OF HER-2 DETECTED. RESULT RATIO OF HER2: CEP 17 SIGNALS 1.20 AVERAGE HER2 COPY NUMBER PER CELL 2.10  ADDITIONAL INFORMATION: Mismatch Repair (MMR) Protein Immunohistochemistry (IHC) IHC Expression Result: MLH1: Preserved nuclear expression (greater 50% tumor expression) MSH2:  Preserved nuclear expression (greater 50% tumor expression) MSH6: Preserved nuclear expression (greater 50% tumor expression) PMS2: Preserved nuclear expression (greater 50% tumor expression) * Internal control demonstrates intact nuclear expression Interpretation: NORMAL There is preserved expression of the major and minor MMR proteins. There is a very low probability that microsatellite instability (MSI) is present. However, certain clinically significant MMR protein mutations may result in preservation of nuclear expression. It is recommended that the preservation of protein expression be correlated with molecular based MSI testing.  [facility] [employer] testing: (+) YB63 and NF1   RADIOGRAPHIC STUDIES: I have personally reviewed the radiological images as listed and agreed with the findings in the report.  Ct Chest, abdomen and pelvis Wo Contrast12/24/2015    IMPRESSION:  1. Findings, as above, highly  concerning for either esophageal or gastric primary malignancy, with metastatic lymphadenopathy in the upper abdomen and retroperitoneum, widespread intraperitoneal spread of disease, malignant ascites, and multiple pulmonary nodules in the visualize lung bases concerning for pulmonary metastasis. Oncologic evaluation is recommended.  2. Additional incidental findings, as above. These results were called by telephone at the time of interpretation on 04/23/2014 at 7:53 pm to [place] , who verbally acknowledged these results.   Electronically Signed   By: Vinnie Langton M.D.   On: 04/23/2014 19:56   CT CAP 06/24/2014 IMPRESSION: 1. Progressive low density and calcifications within the extensive upper abdominal lymphadenopathy, likely related to partial treatment. Some of the lymph nodes do appear slightly larger. The irregular infiltrating mass involving the gastric cardia appears slightly improved. 2. No evidence of hepatic metastatic disease. 3. Overall volume of ascites has improved, although extensive changes of peritoneal carcinomatosis have not significantly changed. 4. No significant change in bilateral pulmonary nodules consistent with metastatic disease. 5. Resolution of bilateral pleural effusions.  CT chest, abdomen and pelvis with contrast on 08/26/2014 IMPRESSION: Overall stable to slight progression of disease. Existing pulmonary nodules are stable to slightly progressed with no new pulmonary nodules evident. The bulky upper abdominal lymphadenopathy is stable to minimally progressed while no substantial change in the primary infiltrating gastric neoplasm is discernible. Omental disease appears stable. There is slightly more ascites on today's study.   EGD WITH BIOPSY 04/25/2014 ENDOSCOPIC IMPRESSION: There was a large, nearly circumferential, ulcerated, clearly malignant mass that stradles the GE junction. The vast bulk of the mass lays in the stomach, occupying  about 1/3 of the stomach. The mass extends into the esophagus for about 4cm above the GE junction. Mutiple biopsies were taken from the gastric portion of the mass   I ASSESSMENT & PLAN:  39 year old gentleman without significant past medical history presents with abdominal bloating, nausea, vomiting, diarrhea, anemia and weight loss. EGD showed a large mass arising from the gastric fundus and extending into GE junction, biopsy showed adenocarcinoma with signet ring features, CT scan is consistent with peritoneal carcinomatosis and bilateral lung metastasis. Ascites cytology was positive for malignant cells.  1. 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 HER-2 negative, MMR-normal -I discussed his surgical biopsy results, cytology results, skin findings with patient and his family members, including his parents and 2 sisters. Unfortunately, this is widely metastatic, not curable, and overall prognosis is very poor. Literature has showed gastric adenocarcinoma with signet ring features has overall worse outcome comparing to others. His life expectancy is probably less than 6 months without treatment. -He has started first-line chemotherapy FOLFOX, tolerated well, and his symptoms have much  improved. -His  tumor is negative for HER-2 overexpression, no role for trastuzumab. -We also checked his tumor for MMR, which was normal so he would unlikely benefit from immunotherapy.,  -[facility] genomic test reviewed no actionable mutation -His first restaging CT scan showed mild partial response, overall stable disease. His ascites has significantly decreased. Pleural effusion resolved. He is clinically doing well.  -second restaging CT shows stable disease overall, with few lymph nodes slightly bigger, no significant new lesions.  -His tumor marker CEA has came down to normal -I recommend to continue the current FOLFOX chemotherapy. -he is interested  in pursuing [place] in [place], which is a intravenous infusion for total of 2 weeks. Giving the slight disease progression on the scan, i.e. recommends to hold it for now.  -due to his family history of malignancy, he has had genetic testing at [place]. It was negative per patient. I asked him to bring the report to me next time. -Lab are reviewed, mild thrombocytopenia, we'll proceed cycle 12 chemotherapy today. His 5-FU dose has been reduced by 5% due to the cytopenia since cycle 9, no 30f blous.  2. Malignant ascites -resolved now after chemo   3. Anorexia, nausea, malnutrition -Nearly resolved, he feels much better lately -His albumin level is still low, the nutrition secondary to malignancy. -I encouraged him to take protein powder and diet supplements as needed.  4. Anemia, multifactorial (iron deficient anemia, cancer related and chemotherapy induced ) -His ferritin level was normal at 171, but he has low iron and saturation, likely some degree of iron deficient anemia. -He has received IV Feraheme 510 mg twice, 2 weeks apart -Blood transfusion as needed during chemotherapy if her hemoglobin below 8. -Mild anemia has been stable.   Plan #1 cycle 12 FOLFOX today,  No 5-FU bolus, 5-fu dose 5% reduction, no Neulasta on day 3 with this cycle  #2 RTC in 2 weeks for cycle 13 with APP -Restaging CT scan after next cycle if no images at [facility] for second opinion  Patient and his family members had many questions. All questions were answered. The patient knows to call the clinic with any problems, questions or concerns.  I spent 20 minutes counseling the patient face to face. The total time spent in the appointment was 25  minutes and more than 50% was on counseling.     FTruitt Merle MD 10/08/2014

## 2014-10-08 NOTE — Patient Instructions (Signed)
[facility] Discharge Instructions for Patients Receiving Chemotherapy  Today you received the following chemotherapy agents oxaliplatin/leucovorin/fluorouracil  To help prevent nausea and vomiting after your treatment, we encourage you to take your nausea medication as directed If you develop nausea and vomiting that is not controlled by your nausea medication, call the clinic.   BELOW ARE SYMPTOMS THAT SHOULD BE REPORTED IMMEDIATELY:  *FEVER GREATER THAN 100.5 F  *CHILLS WITH OR WITHOUT FEVER  NAUSEA AND VOMITING THAT IS NOT CONTROLLED WITH YOUR NAUSEA MEDICATION  *UNUSUAL SHORTNESS OF BREATH  *UNUSUAL BRUISING OR BLEEDING  TENDERNESS IN MOUTH AND THROAT WITH OR WITHOUT PRESENCE OF ULCERS  *URINARY PROBLEMS  *BOWEL PROBLEMS  UNUSUAL RASH Items with * indicate a potential emergency and should be followed up as soon as possible.  Feel free to call the clinic you have any questions or concerns. The clinic phone number is [phone].  

## 2014-10-10 ENCOUNTER — Ambulatory Visit (HOSPITAL_BASED_OUTPATIENT_CLINIC_OR_DEPARTMENT_OTHER): Payer: BLUE CROSS/BLUE SHIELD

## 2014-10-10 ENCOUNTER — Encounter: Payer: Self-pay | Admitting: Hematology

## 2014-10-10 VITALS — BP 144/71 | HR 68 | Temp 98.5°F | Resp 16

## 2014-10-10 DIAGNOSIS — C7802 Secondary malignant neoplasm of left lung: Secondary | ICD-10-CM | POA: Diagnosis not present

## 2014-10-10 DIAGNOSIS — C169 Malignant neoplasm of stomach, unspecified: Secondary | ICD-10-CM

## 2014-10-10 DIAGNOSIS — C7801 Secondary malignant neoplasm of right lung: Secondary | ICD-10-CM | POA: Diagnosis not present

## 2014-10-10 DIAGNOSIS — C786 Secondary malignant neoplasm of retroperitoneum and peritoneum: Secondary | ICD-10-CM | POA: Diagnosis not present

## 2014-10-10 MED ORDER — SODIUM CHLORIDE 0.9 % IJ SOLN
10.0000 mL | INTRAMUSCULAR | Status: DC | PRN
  Administered 2014-10-10: 10 mL
  Filled 2014-10-10: qty 10

## 2014-10-10 MED ORDER — HEPARIN SOD (PORK) LOCK FLUSH 100 UNIT/ML IV SOLN
500.0000 [IU] | Freq: Once | INTRAVENOUS | Status: AC | PRN
  Administered 2014-10-10: 500 [IU]
  Filled 2014-10-10: qty 5

## 2014-10-13 ENCOUNTER — Encounter: Payer: Self-pay | Admitting: Hematology

## 2014-10-15 ENCOUNTER — Encounter: Payer: Self-pay | Admitting: Hematology

## 2014-10-16 ENCOUNTER — Other Ambulatory Visit: Payer: Self-pay | Admitting: *Deleted

## 2014-10-16 NOTE — Addendum Note (Signed)
Addended by: Truitt Merle on: 10/16/2014 09:22 AM   Modules accepted: Orders

## 2014-10-17 ENCOUNTER — Ambulatory Visit: Payer: BLUE CROSS/BLUE SHIELD | Admitting: Hematology

## 2014-10-21 ENCOUNTER — Encounter (HOSPITAL_COMMUNITY): Payer: Self-pay

## 2014-10-21 ENCOUNTER — Ambulatory Visit (HOSPITAL_COMMUNITY)
Admission: RE | Admit: 2014-10-21 | Discharge: 2014-10-21 | Disposition: A | Discharge status: Home / Self Care | Payer: BLUE CROSS/BLUE SHIELD | Source: Ambulatory Visit | Attending: Hematology | Admitting: Hematology

## 2014-10-21 DIAGNOSIS — R918 Other nonspecific abnormal finding of lung field: Secondary | ICD-10-CM | POA: Diagnosis not present

## 2014-10-21 DIAGNOSIS — R599 Enlarged lymph nodes, unspecified: Secondary | ICD-10-CM | POA: Diagnosis not present

## 2014-10-21 DIAGNOSIS — Z08 Encounter for follow-up examination after completed treatment for malignant neoplasm: Secondary | ICD-10-CM | POA: Insufficient documentation

## 2014-10-21 DIAGNOSIS — C169 Malignant neoplasm of stomach, unspecified: Secondary | ICD-10-CM | POA: Insufficient documentation

## 2014-10-21 MED ORDER — IOHEXOL 300 MG/ML  SOLN
100.0000 mL | Freq: Once | INTRAMUSCULAR | Status: AC | PRN
  Administered 2014-10-21: 100 mL via INTRAVENOUS

## 2014-10-22 ENCOUNTER — Telehealth: Payer: Self-pay | Admitting: Hematology

## 2014-10-22 ENCOUNTER — Ambulatory Visit (HOSPITAL_BASED_OUTPATIENT_CLINIC_OR_DEPARTMENT_OTHER): Payer: BLUE CROSS/BLUE SHIELD | Admitting: Hematology

## 2014-10-22 ENCOUNTER — Encounter: Payer: Self-pay | Admitting: Hematology

## 2014-10-22 ENCOUNTER — Other Ambulatory Visit (HOSPITAL_BASED_OUTPATIENT_CLINIC_OR_DEPARTMENT_OTHER): Payer: BLUE CROSS/BLUE SHIELD

## 2014-10-22 ENCOUNTER — Ambulatory Visit (HOSPITAL_BASED_OUTPATIENT_CLINIC_OR_DEPARTMENT_OTHER): Payer: BLUE CROSS/BLUE SHIELD

## 2014-10-22 VITALS — BP 130/65 | HR 73 | Temp 98.4°F | Resp 18

## 2014-10-22 DIAGNOSIS — C169 Malignant neoplasm of stomach, unspecified: Secondary | ICD-10-CM

## 2014-10-22 DIAGNOSIS — C786 Secondary malignant neoplasm of retroperitoneum and peritoneum: Secondary | ICD-10-CM

## 2014-10-22 DIAGNOSIS — D509 Iron deficiency anemia, unspecified: Secondary | ICD-10-CM

## 2014-10-22 DIAGNOSIS — Z5111 Encounter for antineoplastic chemotherapy: Secondary | ICD-10-CM | POA: Diagnosis not present

## 2014-10-22 DIAGNOSIS — D6481 Anemia due to antineoplastic chemotherapy: Secondary | ICD-10-CM

## 2014-10-22 DIAGNOSIS — R180 Malignant ascites: Secondary | ICD-10-CM

## 2014-10-22 DIAGNOSIS — C7801 Secondary malignant neoplasm of right lung: Secondary | ICD-10-CM | POA: Diagnosis not present

## 2014-10-22 DIAGNOSIS — C7802 Secondary malignant neoplasm of left lung: Secondary | ICD-10-CM

## 2014-10-22 DIAGNOSIS — R630 Anorexia: Secondary | ICD-10-CM

## 2014-10-22 DIAGNOSIS — R634 Abnormal weight loss: Secondary | ICD-10-CM

## 2014-10-22 DIAGNOSIS — D630 Anemia in neoplastic disease: Secondary | ICD-10-CM

## 2014-10-22 DIAGNOSIS — R110 Nausea: Secondary | ICD-10-CM

## 2014-10-22 LAB — CBC WITH DIFFERENTIAL/PLATELET
BASO%: 0.4 % (ref 0.0–2.0)
Basophils Absolute: 0 10*3/uL (ref 0.0–0.1)
EOS%: 1.4 % (ref 0.0–7.0)
Eosinophils Absolute: 0.1 10*3/uL (ref 0.0–0.5)
HCT: 38.2 % — ABNORMAL LOW (ref 38.4–49.9)
HGB: 12.5 g/dL — ABNORMAL LOW (ref 13.0–17.1)
LYMPH#: 1.6 10*3/uL (ref 0.9–3.3)
LYMPH%: 28.8 % (ref 14.0–49.0)
MCH: 29.1 pg (ref 27.2–33.4)
MCHC: 32.7 g/dL (ref 32.0–36.0)
MCV: 89 fL (ref 79.3–98.0)
MONO#: 1 10*3/uL — ABNORMAL HIGH (ref 0.1–0.9)
MONO%: 17.8 % — AB (ref 0.0–14.0)
NEUT#: 2.9 10*3/uL (ref 1.5–6.5)
NEUT%: 51.6 % (ref 39.0–75.0)
Platelets: 155 10*3/uL (ref 140–400)
RBC: 4.29 10*6/uL (ref 4.20–5.82)
RDW: 16.6 % — ABNORMAL HIGH (ref 11.0–14.6)
WBC: 5.6 10*3/uL (ref 4.0–10.3)

## 2014-10-22 LAB — COMPREHENSIVE METABOLIC PANEL (CC13)
ALT: 16 U/L (ref 0–55)
ANION GAP: 10 meq/L (ref 3–11)
AST: 27 U/L (ref 5–34)
Albumin: 2.8 g/dL — ABNORMAL LOW (ref 3.5–5.0)
Alkaline Phosphatase: 147 U/L (ref 40–150)
BILIRUBIN TOTAL: 0.56 mg/dL (ref 0.20–1.20)
BUN: 14.3 mg/dL (ref 7.0–26.0)
CO2: 25 meq/L (ref 22–29)
Calcium: 8.6 mg/dL (ref 8.4–10.4)
Chloride: 107 mEq/L (ref 98–109)
Creatinine: 0.8 mg/dL (ref 0.7–1.3)
GLUCOSE: 98 mg/dL (ref 70–140)
Potassium: 3.9 mEq/L (ref 3.5–5.1)
Sodium: 143 mEq/L (ref 136–145)
Total Protein: 6.5 g/dL (ref 6.4–8.3)

## 2014-10-22 MED ORDER — DEXTROSE 5 % IV SOLN
400.0000 mg/m2 | Freq: Once | INTRAVENOUS | Status: AC
  Administered 2014-10-22: 996 mg via INTRAVENOUS
  Filled 2014-10-22: qty 49.8

## 2014-10-22 MED ORDER — DEXTROSE 5 % IV SOLN
Freq: Once | INTRAVENOUS | Status: AC
  Administered 2014-10-22: 11:00:00 via INTRAVENOUS

## 2014-10-22 MED ORDER — PROCHLORPERAZINE MALEATE 10 MG PO TABS
ORAL_TABLET | ORAL | Status: AC
  Filled 2014-10-22: qty 1

## 2014-10-22 MED ORDER — PROCHLORPERAZINE MALEATE 10 MG PO TABS
10.0000 mg | ORAL_TABLET | Freq: Four times a day (QID) | ORAL | Status: DC | PRN
  Administered 2014-10-22: 10 mg via ORAL

## 2014-10-22 MED ORDER — DEXAMETHASONE SODIUM PHOSPHATE 100 MG/10ML IJ SOLN
Freq: Once | INTRAMUSCULAR | Status: AC
  Administered 2014-10-22: 12:00:00 via INTRAVENOUS
  Filled 2014-10-22: qty 4

## 2014-10-22 MED ORDER — SODIUM CHLORIDE 0.9 % IV SOLN
2400.0000 mg/m2 | INTRAVENOUS | Status: DC
  Administered 2014-10-22: 6000 mg via INTRAVENOUS
  Filled 2014-10-22: qty 120

## 2014-10-22 MED ORDER — OXALIPLATIN CHEMO INJECTION 100 MG/20ML
80.0000 mg/m2 | Freq: Once | INTRAVENOUS | Status: AC
  Administered 2014-10-22: 200 mg via INTRAVENOUS
  Filled 2014-10-22: qty 40

## 2014-10-22 NOTE — Patient Instructions (Addendum)
[facility] Discharge Instructions for Patients Receiving Chemotherapy  Today you received the following chemotherapy agents: Leucovorin, Oxaliplatin, 5FU pump  To help prevent nausea and vomiting after your treatment, we encourage you to take your nausea medication as prescribed by your physician.   If you develop nausea and vomiting that is not controlled by your nausea medication, call the clinic.   BELOW ARE SYMPTOMS THAT SHOULD BE REPORTED IMMEDIATELY:  *FEVER GREATER THAN 100.5 F  *CHILLS WITH OR WITHOUT FEVER  NAUSEA AND VOMITING THAT IS NOT CONTROLLED WITH YOUR NAUSEA MEDICATION  *UNUSUAL SHORTNESS OF BREATH  *UNUSUAL BRUISING OR BLEEDING  TENDERNESS IN MOUTH AND THROAT WITH OR WITHOUT PRESENCE OF ULCERS  *URINARY PROBLEMS  *BOWEL PROBLEMS  UNUSUAL RASH Items with * indicate a potential emergency and should be followed up as soon as possible.  Feel free to call the clinic you have any questions or concerns. The clinic phone number is [phone].  Please show the [place] at check-in to the Emergency Department and triage nurse.

## 2014-10-22 NOTE — Telephone Encounter (Signed)
per pof to sch pt appt-sent MW email to move trmt to coordinate w/MD appt

## 2014-10-22 NOTE — Progress Notes (Signed)
[place]  [phone] [phone]) [phone]  Clinic New Consult Note   Patient Care Team: No Pcp Per Patient as PCP - General (General Practice) 10/22/2014  CHIEF COMPLAINTS Follow up metastatic gastric cancer    Gastric adenocarcinoma: Stage IV   04/23/2014 Imaging CT abdomen showed peritoneal carcinomatosis, ascites, thickning of gastric and GEJ wall, CT chest showed a few small b/l lung nodules.    04/24/2014 Tumor Marker CEA  5.6, CA19.9 13   04/25/2014 Initial Diagnosis Gastric adenocarcinoma: Stage IV   04/25/2014 Pathology Results Stomach mass biopsy showed - ADENOCARCINOMA WITH SIGNET RING FEATURES. ascites cytology (+)   04/25/2014 Procedure EGD: There was a large, nearly circumferential, ulcerated, clearly malignant mass that stradles the GE junction. The vast bulk of the mass lays in the stomach.    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 I saw him first when he was [facility] He is here for the first office visit after hospital discharge.  He was  admitted on 12/15 with progressive, 4 month history of dysphagia, epigastric abdominal discomfort and bloating, early satiation accompanied by nausea, and postprandial vomiting and diarrhea. He has experienced a 30 lbs weight loss in the process. He has noted increased abdominal girth. Patient denies any shortness of breath, chest pain, GI bleed, hematuria or lower extremity swelling. He denies heavy alcohol intake. Denies tobacco abuse. Denies  risk factors for HIV or hepatitis. He denies any family history of GI malignancies.  CT of the abdomen and pelvis with contrast on 12/15 revealed several pulmonary nodules at the bases bilaterally, profound thickening of the distal esophagus extending beyond the gastroesophageal junction into the proximal stomach,cardia and fundus of the stomach, most evident along the lesser curvature. No pathologic dilatation of small bowel or colon. Extensive upper abdominal and retroperitoneal lymphadenopathy was visualized. Moderate volume of ascites, presumably malignant, was noted. Extensive soft tissue nodularity throughout the omentum, compatible with omental caking and widespread peritoneal metastasis. Additionally, there are other areas of enhancing nodularity along the peritoneal surface. Tiny ventral and umbilical hernias containing predominantly omental fat, including some omental implants. There are no aggressive appearing lytic or blastic lesions noted in the visualized portions of the skeleton.  He underwent an EGD by Dr. JArdis Hughson 04/25/2014, which showed a large fungating mass that straddles the GE junction and occupying the upper 1/3 part of the stomach. Multiple biopsy was taken which showed adenocarcinoma with signet ring features. He also underwent a paracentesis with 4.5 L fluid is removed on 04/26/2014. Cytology was positive for adenocarcinoma with signet ring features, consistent with metastatic disease.  He was discharged to home on 04/27/2014. He did feel a slightly informant of his abdominal bloating after the paracentesis, but not much improvement other symptoms. He had Mediport placement and the second paracentesis was again 4 L fluids removal on 05/02/2014. His appetite is low, he drinks ensure 3 bottles a day, some soup and a very little other foods. His energy level is also low, but able to take care of  all his daily needs, and move around with about much difficulty. His abdominal  discomfort/pain are relatively controlled by morphine 15 mg at night and tramadol 2-3 times during the day. He denies any fever, chills, cough, chest pain or any in the other discomfort.  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 He continues doing well, without any complaints, except mild abdominal distention. He has good appetite and energy level, tolerating chemotherapy very well, no noticeable side effects. He denies any fever or chills, no bleeding, bowel movement is normal, he tolerates all routine daily activities without any difficulty. He has postponed his visit at [facility] to early August for second opinion.   MEDICAL HISTORY:  Past Medical History  Diagnosis Date  . Medical history non-contributory   . gastric ca dx'd 04/2014    SURGICAL HISTORY: Past Surgical History  Procedure Laterality Date  . Squamous cell carcinoma excision  approx March 2015    removed from top of head  . Esophagogastroduodenoscopy (egd) with propofol N/A 04/25/2014    Procedure: ESOPHAGOGASTRODUODENOSCOPY (EGD) WITH PROPOFOL;  Surgeon: Milus Banister, MD;  Location: [facility] ENDOSCOPY;  Service: Endoscopy;  Laterality: N/A;    SOCIAL HISTORY: History   Social History  . Marital Status: Single    Spouse Name: N/A  . Number of Children: N/A  . Years of Education: N/A   Occupational History  . Not on file.   Social History Main Topics  . Smoking status: Never Smoker   . Smokeless tobacco: Never Used  . Alcohol Use: No  . Drug Use: No  . Sexual Activity: Not on file   Other Topics Concern  . Not on file   Social History Narrative    FAMILY HISTORY: Family History  Problem Relation Age of Onset  . Breast cancer Sister 39   . Hypertension Mother   . Hypertension Father   Maternal aunt breast cancer at age of 57 Maternal cousin ovarian cancer at age of 57 Maternal ancle  prostate cancer age of 93   ALLERGIES:  has No Known Allergies.  MEDICATIONS:  Current Outpatient Prescriptions  Medication Sig Dispense Refill  . docusate sodium 100 MG CAPS Take 100 mg by mouth 2 (two) times daily. (Patient not taking: Reported on 09/24/2014) 10 capsule 0  . lidocaine-prilocaine (EMLA) cream Apply 1 application topically as needed. 30 g 0  . mirtazapine (REMERON) 15 MG tablet Take 1 tablet (15 mg total) by mouth at bedtime. (Patient not taking: Reported on 09/24/2014) 30 tablet 1  . morphine (MS CONTIN) 15 MG 12 hr tablet Take 1 tablet (15 mg total) by mouth every 12 (twelve) hours. (Patient not taking: Reported on 09/24/2014) 60 tablet 0  . ondansetron (ZOFRAN) 4 MG tablet Take 1 tablet (4 mg total) by mouth every 8 (eight) hours as needed for nausea or vomiting. (Patient not taking: Reported on 09/24/2014) 20 tablet 0  . pantoprazole (PROTONIX) 40 MG tablet Take 1 tablet (40 mg total) by mouth daily. (Patient not taking: Reported on 09/24/2014) 30 tablet 0  . prochlorperazine (COMPAZINE) 10 MG tablet Take 1 tablet (10 mg total) by mouth every 6 (six) hours as needed for nausea or vomiting. (Patient not taking: Reported on 09/24/2014) 30 tablet 3  . traMADol (ULTRAM) 50 MG tablet Take 1-2 tablets (50-100 mg total) by mouth every 6 (six) hours as needed for moderate pain. (Patient not taking:  Reported on 09/24/2014) 30 tablet 0   No current facility-administered medications for this visit.   Facility-Administered Medications Ordered in Other Visits  Medication Dose Route Frequency Provider Last Rate Last Dose  . fluorouracil (ADRUCIL) 6,000 mg in sodium chloride 0.9 % 130 mL chemo infusion  2,400 mg/m2 (Treatment Plan Actual) Intravenous 1 day or 1 dose Truitt Merle, MD   6,000 mg at 10/22/14 1438  . prochlorperazine (COMPAZINE) tablet 10 mg  10 mg Oral Q6H PRN Truitt Merle, MD   10 mg at 10/22/14 1140    REVIEW OF SYSTEMS:   Constitutional: Denies fevers, chills or abnormal night  sweats, (+) weight loss and anorexia Eyes: Denies blurriness of vision, double vision or watery eyes Ears, nose, mouth, throat, and face: Denies mucositis or sore throat Respiratory: Denies cough, dyspnea or wheezes Cardiovascular: Denies palpitation, chest discomfort or lower extremity swelling Gastrointestinal:  Less nausea, abdominal bloating and discomfort Skin: Denies abnormal skin rashes Lymphatics: Denies new lymphadenopathy or easy bruising Neurological:Denies numbness, tingling or new weaknesses Behavioral/Psych: Mood is stable, no new changes  All other systems were reviewed with the patient and are negative except those mentioned in the history.  PHYSICAL EXAMINATION: ECOG PERFORMANCE STATUS: 1  GENERAL:alert, no distress and comfortable SKIN: skin color, texture, turgor are normal, no rashes or significant lesions EYES: normal, conjunctiva are pink and non-injected, sclera clear OROPHARYNX:no exudate, no erythema and lips, buccal mucosa, and tongue normal  NECK: supple, thyroid normal size, non-tender, without nodularity LYMPH:  no palpable lymphadenopathy in the cervical, axillary or inguinal LUNGS: clear to auscultation and percussion with normal breathing effort HEART: regular rate & rhythm and no murmurs and no lower extremity edema ABDOMEN:abdomen soft, non-tender and normal bowel sounds, probable small amount of ascites. Musculoskeletal:no cyanosis of digits and no clubbing  PSYCH: alert & oriented x 3 with fluent speech NEURO: no focal motor/sensory deficits  LABORATORY DATA:  I have reviewed the data as listed CBC Latest Ref Rng 10/22/2014 10/08/2014 09/23/2014  WBC 4.0 - 10.3 10e3/uL 5.6 4.1 4.4  Hemoglobin 13.0 - 17.1 g/dL 12.5(L) 12.4(L) 12.7(L)  Hematocrit 38.4 - 49.9 % 38.2(L) 37.8(L) 39.8  Platelets 140 - 400 10e3/uL 155 137(L) 105(L)      CMP Latest Ref Rng 10/22/2014 10/08/2014 09/23/2014  Glucose 70 - 140 mg/dl 98 98 98  BUN 7.0 - 26.0 mg/dL 14.3 17.9  13.6  Creatinine 0.7 - 1.3 mg/dL 0.8 0.8 0.8  Sodium 136 - 145 mEq/L 143 142 143  Potassium 3.5 - 5.1 mEq/L 3.9 4.1 3.8  Chloride 96 - 112 mEq/L - - -  CO2 22 - 29 mEq/L 25 25 25   Calcium 8.4 - 10.4 mg/dL 8.6 8.5 8.6  Total Protein 6.4 - 8.3 g/dL 6.5 6.6 6.2(L)  Total Bilirubin 0.20 - 1.20 mg/dL 0.56 0.61 0.50  Alkaline Phos 40 - 150 U/L 147 137 140  AST 5 - 34 U/L 27 25 24   ALT 0 - 55 U/L 16 13 14      CEA  Status: Finalresult Visible to patient:  MyChart Nextappt: Today at 08:45 AM in Oncology [facility] 4) Dx:  Gastric adenocarcinoma: Stage IV           Ref Range 39moago  538mogo     CEA 0.0 - 5.0 ng/mL 1.7 5.6 (H)CM          Pathology report  Diagnosis  PERITONEAL/ASCITIC FLUID(SPECIMEN 1 OF 1 COLLECTED 04/26/14): METASTATIC ADENOCARCINOMA WITH SIGNET RING CELL FEATURES.  Stomach, biopsy,  r/o cancer 04/25/14 - ADENOCARCINOMA WITH SIGNET RING FEATURES. Microscopic Comment The results were called to Dr. Ardis Hughs on 04/26/2014. (JDP:kh 04/26/14) Claudette Laws MD Pathologist, Electronic Signature (Case signed 04/26/2014)  HER-2/NEU BY CISH - NO AMPLIFICATION OF HER-2 DETECTED. RESULT RATIO OF HER2: CEP 17 SIGNALS 1.20 AVERAGE HER2 COPY NUMBER PER CELL 2.10  ADDITIONAL INFORMATION: Mismatch Repair (MMR) Protein Immunohistochemistry (IHC) IHC Expression Result: MLH1: Preserved nuclear expression (greater 50% tumor expression) MSH2: Preserved nuclear expression (greater 50% tumor expression) MSH6: Preserved nuclear expression (greater 50% tumor expression) PMS2: Preserved nuclear expression (greater 50% tumor expression) * Internal control demonstrates intact nuclear expression Interpretation: NORMAL There is preserved expression of the major and minor MMR proteins. There is a very low probability that microsatellite instability (MSI) is present. However, certain clinically significant MMR protein mutations may result in preservation of  nuclear expression. It is recommended that the preservation of protein expression be correlated with molecular based MSI testing.  [employer] testing: (+) OV29 and NF1   RADIOGRAPHIC STUDIES: I have personally reviewed the radiological images as listed and agreed with the findings in the report.  Ct Chest, abdomen and pelvis Wo Contrast12/24/2015    IMPRESSION:  1. Findings, as above, highly concerning for either esophageal or gastric primary malignancy, with metastatic lymphadenopathy in the upper abdomen and retroperitoneum, widespread intraperitoneal spread of disease, malignant ascites, and multiple pulmonary nodules in the visualize lung bases concerning for pulmonary metastasis. Oncologic evaluation is recommended.  2. Additional incidental findings, as above. These results were called by telephone at the time of interpretation on 04/23/2014 at 7:53 pm to [place] , who verbally acknowledged these results.   Electronically Signed   By: Vinnie Langton M.D.   On: 04/23/2014 19:56   CT CAP 06/24/2014 IMPRESSION: 1. Progressive low density and calcifications within the extensive upper abdominal lymphadenopathy, likely related to partial treatment. Some of the lymph nodes do appear slightly larger. The irregular infiltrating mass involving the gastric cardia appears slightly improved. 2. No evidence of hepatic metastatic disease. 3. Overall volume of ascites has improved, although extensive changes of peritoneal carcinomatosis have not significantly changed. 4. No significant change in bilateral pulmonary nodules consistent with metastatic disease. 5. Resolution of bilateral pleural effusions.  CT chest, abdomen and pelvis with contrast on 08/26/2014 IMPRESSION: Overall stable to slight progression of disease. Existing pulmonary nodules are stable to slightly progressed with no new pulmonary nodules evident. The bulky upper abdominal lymphadenopathy is  stable to minimally progressed while no substantial change in the primary infiltrating gastric neoplasm is discernible. Omental disease appears stable. There is slightly more ascites on today's study.  CT chest, abdomen and pelvis with contrast on 10/21/2014 IMPRESSION: 1. Compared to the prior examination there appears to be slight progression of disease predominantly manifest by worsening gastric mucosal thickening, particularly along the greater curvature of the stomach in the region of the cardia and fundus, with increasing adjacent mesenteric/omental disease and slight increase in malignant ascites. Previously described upper abdominal lymphadenopathy is generally stable to slightly decreased compared to the prior examination, and previously noted pulmonary nodules are unchanged. 2. Additional incidental findings, as above.   EGD WITH BIOPSY 04/25/2014 ENDOSCOPIC IMPRESSION: There was a large, nearly circumferential, ulcerated, clearly malignant mass that stradles the GE junction. The vast bulk of the mass lays in the stomach, occupying about 1/3 of the stomach. The mass extends into the esophagus for about 4cm above the GE junction. Mutiple biopsies were taken from the gastric  portion of the mass   I ASSESSMENT & PLAN:  39 year old gentleman without significant past medical history presents with abdominal bloating, nausea, vomiting, diarrhea, anemia and weight loss. EGD showed a large mass arising from the gastric fundus and extending into GE junction, biopsy showed adenocarcinoma with signet ring features, CT scan is consistent with peritoneal carcinomatosis and bilateral lung metastasis. Ascites cytology was positive for malignant cells.  1. 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 HER-2 negative, MMR-normal -I discussed his surgical biopsy results, cytology results, skin findings with patient and his family members,  including his parents and 2 sisters. Unfortunately, this is widely metastatic, not curable, and overall prognosis is very poor. Literature has showed gastric adenocarcinoma with signet ring features has overall worse outcome comparing to others. His life expectancy is probably less than 6 months without treatment. -He has started first-line chemotherapy FOLFOX, tolerated well, and his symptoms have much  improved. -His tumor is negative for HER-2 overexpression, no role for trastuzumab. -We also checked his tumor for MMR, which was normal so he would unlikely benefit from immunotherapy.,  -[employer] genomic test reviewed no actionable mutation -His first restaging CT scan showed mild partial response, overall stable disease. His ascites has significantly decreased. Pleural effusion resolved. He is clinically doing well.  -I discussed his third restaging CT findings with patient and his father. It seems he had a mixed response, slight increase of the gastric wall thickness and the ascites, abdominal lymph nodes are smaller, overall I consider as stable disease or slight disease progression.  -His tumor marker CEA has came down to normalHe is clinically doing well, I recommended continuing FOLFOX. However, if his ascites returns significantly, I'll change him to second line FOLFIRI.  -he was interested in pursuing [facility] in [place], which is a intravenous infusion for total of 2 weeks. Giving the slight disease progression on the scan, i.e. recommends to hold it for now.  -due to his family history of malignancy, he has had genetic testing at [place]. It was negative per patient. I asked him to bring the report to me next time. -Lab are reviewed, adequate for treatment, we'll proceed cycle 13 chemotherapy today with full 5-FU dose (no bolus).  2. Malignant ascites -resolved now after chemo  -Slightly increased on recent CT CT scan, we'll monitor closely.  3. Anorexia, nausea,  malnutrition -Nearly resolved, he feels much better lately -His albumin level is still low, the nutrition secondary to malignancy. -I encouraged him to take protein powder and diet supplements as needed.  4. Anemia, multifactorial (iron deficient anemia, cancer related and chemotherapy induced ) -His ferritin level was normal at 171, but he has low iron and saturation, likely some degree of iron deficient anemia. -He has received IV Feraheme 510 mg twice, 2 weeks apart -Blood transfusion as needed during chemotherapy if her hemoglobin below 8. -Mild anemia has been stable.   Plan #1 cycle 13 FOLFOX today,  No 5-FU bolus, 5-fu full dose, no Neulasta on day 3 with this cycle  #2 RTC in 2 weeks for cycle 14 with APP or me   Patient and his family members had many questions. All questions were answered. The patient knows to call the clinic with any problems, questions or concerns.  I spent 20 minutes counseling the patient face to face. The total time spent in the appointment was 25  minutes and more than 50% was on counseling.  Truitt Merle, MD 10/22/2014

## 2014-10-23 ENCOUNTER — Other Ambulatory Visit: Payer: BLUE CROSS/BLUE SHIELD

## 2014-10-23 ENCOUNTER — Telehealth: Payer: Self-pay | Admitting: Hematology

## 2014-10-23 NOTE — Telephone Encounter (Signed)
per pof to sch pt appt-pt aware of 6/16 appt

## 2014-10-24 ENCOUNTER — Ambulatory Visit (HOSPITAL_BASED_OUTPATIENT_CLINIC_OR_DEPARTMENT_OTHER): Payer: BLUE CROSS/BLUE SHIELD

## 2014-10-24 ENCOUNTER — Telehealth: Payer: Self-pay | Admitting: Hematology

## 2014-10-24 VITALS — BP 128/60 | HR 69 | Temp 97.5°F

## 2014-10-24 DIAGNOSIS — C7802 Secondary malignant neoplasm of left lung: Secondary | ICD-10-CM | POA: Diagnosis not present

## 2014-10-24 DIAGNOSIS — C786 Secondary malignant neoplasm of retroperitoneum and peritoneum: Secondary | ICD-10-CM | POA: Diagnosis not present

## 2014-10-24 DIAGNOSIS — C169 Malignant neoplasm of stomach, unspecified: Secondary | ICD-10-CM | POA: Diagnosis not present

## 2014-10-24 DIAGNOSIS — C7801 Secondary malignant neoplasm of right lung: Secondary | ICD-10-CM

## 2014-10-24 MED ORDER — HEPARIN SOD (PORK) LOCK FLUSH 100 UNIT/ML IV SOLN
500.0000 [IU] | Freq: Once | INTRAVENOUS | Status: AC | PRN
  Administered 2014-10-24: 500 [IU]
  Filled 2014-10-24: qty 5

## 2014-10-24 MED ORDER — SODIUM CHLORIDE 0.9 % IJ SOLN
10.0000 mL | INTRAMUSCULAR | Status: DC | PRN
  Administered 2014-10-24: 10 mL
  Filled 2014-10-24: qty 10

## 2014-10-24 NOTE — Patient Instructions (Signed)
Fluorouracil, 5-FU injection What is this medicine? FLUOROURACIL, 5-FU (flure oh YOOR a sil) is a chemotherapy drug. It slows the growth of cancer cells. This medicine is used to treat many types of cancer like breast cancer, colon or rectal cancer, pancreatic cancer, and stomach cancer. This medicine may be used for other purposes; ask your health care provider or pharmacist if you have questions. COMMON BRAND NAME(S): Adrucil What should I tell my health care provider before I take this medicine? They need to know if you have any of these conditions: -blood disorders -dihydropyrimidine dehydrogenase (DPD) deficiency -infection (especially a virus infection such as chickenpox, cold sores, or herpes) -kidney disease -liver disease -malnourished, poor nutrition -recent or ongoing radiation therapy -an unusual or allergic reaction to fluorouracil, other chemotherapy, other medicines, foods, dyes, or preservatives -pregnant or trying to get pregnant -breast-feeding How should I use this medicine? This drug is given as an infusion or injection into a vein. It is administered in a hospital or clinic by a specially trained health care professional. Talk to your pediatrician regarding the use of this medicine in children. Special care may be needed. Overdosage: If you think you have taken too much of this medicine contact a poison control center or emergency room at once. NOTE: This medicine is only for you. Do not share this medicine with others. What if I miss a dose? It is important not to miss your dose. Call your doctor or health care professional if you are unable to keep an appointment. What may interact with this medicine? -allopurinol -cimetidine -dapsone -digoxin -hydroxyurea -leucovorin -levamisole -medicines for seizures like ethotoin, fosphenytoin, phenytoin -medicines to increase blood counts like filgrastim, pegfilgrastim, sargramostim -medicines that treat or prevent blood  clots like warfarin, enoxaparin, and dalteparin -methotrexate -metronidazole -pyrimethamine -some other chemotherapy drugs like busulfan, cisplatin, estramustine, vinblastine -trimethoprim -trimetrexate -vaccines Talk to your doctor or health care professional before taking any of these medicines: -acetaminophen -aspirin -ibuprofen -ketoprofen -naproxen This list may not describe all possible interactions. Give your health care provider a list of all the medicines, herbs, non-prescription drugs, or dietary supplements you use. Also tell them if you smoke, drink alcohol, or use illegal drugs. Some items may interact with your medicine. What should I watch for while using this medicine? Visit your doctor for checks on your progress. This drug may make you feel generally unwell. This is not uncommon, as chemotherapy can affect healthy cells as well as cancer cells. Report any side effects. Continue your course of treatment even though you feel ill unless your doctor tells you to stop. In some cases, you may be given additional medicines to help with side effects. Follow all directions for their use. Call your doctor or health care professional for advice if you get a fever, chills or sore throat, or other symptoms of a cold or flu. Do not treat yourself. This drug decreases your body's ability to fight infections. Try to avoid being around people who are sick. This medicine may increase your risk to bruise or bleed. Call your doctor or health care professional if you notice any unusual bleeding. Be careful brushing and flossing your teeth or using a toothpick because you may get an infection or bleed more easily. If you have any dental work done, tell your dentist you are receiving this medicine. Avoid taking products that contain aspirin, acetaminophen, ibuprofen, naproxen, or ketoprofen unless instructed by your doctor. These medicines may hide a fever. Do not become pregnant while taking this    medicine. Women should inform their doctor if they wish to become pregnant or think they might be pregnant. There is a potential for serious side effects to an unborn child. Talk to your health care professional or pharmacist for more information. Do not breast-feed an infant while taking this medicine. Men should inform their doctor if they wish to father a child. This medicine may lower sperm counts. Do not treat diarrhea with over the counter products. Contact your doctor if you have diarrhea that lasts more than 2 days or if it is severe and watery. This medicine can make you more sensitive to the sun. Keep out of the sun. If you cannot avoid being in the sun, wear protective clothing and use sunscreen. Do not use sun lamps or tanning beds/booths. What side effects may I notice from receiving this medicine? Side effects that you should report to your doctor or health care professional as soon as possible: -allergic reactions like skin rash, itching or hives, swelling of the face, lips, or tongue -low blood counts - this medicine may decrease the number of white blood cells, red blood cells and platelets. You may be at increased risk for infections and bleeding. -signs of infection - fever or chills, cough, sore throat, pain or difficulty passing urine -signs of decreased platelets or bleeding - bruising, pinpoint red spots on the skin, black, tarry stools, blood in the urine -signs of decreased red blood cells - unusually weak or tired, fainting spells, lightheadedness -breathing problems -changes in vision -chest pain -mouth sores -nausea and vomiting -pain, swelling, redness at site where injected -pain, tingling, numbness in the hands or feet -redness, swelling, or sores on hands or feet -stomach pain -unusual bleeding Side effects that usually do not require medical attention (report to your doctor or health care professional if they continue or are bothersome): -changes in finger or  toe nails -diarrhea -dry or itchy skin -hair loss -headache -loss of appetite -sensitivity of eyes to the light -stomach upset -unusually teary eyes This list may not describe all possible side effects. Call your doctor for medical advice about side effects. You may report side effects to FDA at 1-800-FDA-1088. Where should I keep my medicine? This drug is given in a hospital or clinic and will not be stored at home. NOTE: This sheet is a summary. It may not cover all possible information. If you have questions about this medicine, talk to your doctor, pharmacist, or health care provider.  2015, Elsevier/Gold Standard. (2007-08-30 13:53:16)   

## 2014-10-24 NOTE — Telephone Encounter (Signed)
Pt confirmed labs/ov per 06/16 POF, gave pt AVS and Calender....... KJ °

## 2014-11-05 ENCOUNTER — Ambulatory Visit (HOSPITAL_BASED_OUTPATIENT_CLINIC_OR_DEPARTMENT_OTHER): Payer: BLUE CROSS/BLUE SHIELD | Admitting: Hematology

## 2014-11-05 ENCOUNTER — Other Ambulatory Visit (HOSPITAL_BASED_OUTPATIENT_CLINIC_OR_DEPARTMENT_OTHER): Payer: BLUE CROSS/BLUE SHIELD

## 2014-11-05 ENCOUNTER — Other Ambulatory Visit: Payer: BLUE CROSS/BLUE SHIELD

## 2014-11-05 ENCOUNTER — Ambulatory Visit: Payer: BLUE CROSS/BLUE SHIELD

## 2014-11-05 ENCOUNTER — Ambulatory Visit (HOSPITAL_BASED_OUTPATIENT_CLINIC_OR_DEPARTMENT_OTHER): Payer: BLUE CROSS/BLUE SHIELD

## 2014-11-05 ENCOUNTER — Encounter: Payer: Self-pay | Admitting: Hematology

## 2014-11-05 VITALS — BP 151/75 | HR 70 | Temp 98.0°F | Resp 18

## 2014-11-05 DIAGNOSIS — D6481 Anemia due to antineoplastic chemotherapy: Secondary | ICD-10-CM

## 2014-11-05 DIAGNOSIS — C7801 Secondary malignant neoplasm of right lung: Secondary | ICD-10-CM

## 2014-11-05 DIAGNOSIS — C169 Malignant neoplasm of stomach, unspecified: Secondary | ICD-10-CM

## 2014-11-05 DIAGNOSIS — C786 Secondary malignant neoplasm of retroperitoneum and peritoneum: Secondary | ICD-10-CM

## 2014-11-05 DIAGNOSIS — D509 Iron deficiency anemia, unspecified: Secondary | ICD-10-CM

## 2014-11-05 DIAGNOSIS — E46 Unspecified protein-calorie malnutrition: Secondary | ICD-10-CM

## 2014-11-05 DIAGNOSIS — R630 Anorexia: Secondary | ICD-10-CM

## 2014-11-05 DIAGNOSIS — Z5111 Encounter for antineoplastic chemotherapy: Secondary | ICD-10-CM

## 2014-11-05 DIAGNOSIS — D630 Anemia in neoplastic disease: Secondary | ICD-10-CM

## 2014-11-05 DIAGNOSIS — C7802 Secondary malignant neoplasm of left lung: Secondary | ICD-10-CM

## 2014-11-05 DIAGNOSIS — Z95828 Presence of other vascular implants and grafts: Secondary | ICD-10-CM

## 2014-11-05 DIAGNOSIS — R110 Nausea: Secondary | ICD-10-CM

## 2014-11-05 LAB — COMPREHENSIVE METABOLIC PANEL (CC13)
ALBUMIN: 2.9 g/dL — AB (ref 3.5–5.0)
ALT: 16 U/L (ref 0–55)
AST: 28 U/L (ref 5–34)
Alkaline Phosphatase: 162 U/L — ABNORMAL HIGH (ref 40–150)
Anion Gap: 5 mEq/L (ref 3–11)
BILIRUBIN TOTAL: 0.54 mg/dL (ref 0.20–1.20)
BUN: 14.8 mg/dL (ref 7.0–26.0)
CO2: 27 mEq/L (ref 22–29)
CREATININE: 0.8 mg/dL (ref 0.7–1.3)
Calcium: 8.7 mg/dL (ref 8.4–10.4)
Chloride: 109 mEq/L (ref 98–109)
EGFR: >90 mL/min/{1.73_m2} (ref >90)
GLUCOSE: 93 mg/dL (ref 70–140)
POTASSIUM: 3.9 meq/L (ref 3.5–5.1)
SODIUM: 141 meq/L (ref 136–145)
TOTAL PROTEIN: 6.4 g/dL (ref 6.4–8.3)

## 2014-11-05 LAB — CBC WITH DIFFERENTIAL/PLATELET
BASO%: 0.2 % (ref 0.0–2.0)
BASOS ABS: 0 10*3/uL (ref 0.0–0.1)
EOS%: 1.6 % (ref 0.0–7.0)
Eosinophils Absolute: 0.1 10*3/uL (ref 0.0–0.5)
HCT: 38.5 % (ref 38.4–49.9)
HGB: 12.6 g/dL — ABNORMAL LOW (ref 13.0–17.1)
LYMPH%: 34.8 % (ref 14.0–49.0)
MCH: 28.8 pg (ref 27.2–33.4)
MCHC: 32.7 g/dL (ref 32.0–36.0)
MCV: 87.9 fL (ref 79.3–98.0)
MONO#: 0.9 10*3/uL (ref 0.1–0.9)
MONO%: 20 % — AB (ref 0.0–14.0)
NEUT#: 1.9 10*3/uL (ref 1.5–6.5)
NEUT%: 43.4 % (ref 39.0–75.0)
PLATELETS: 132 10*3/uL — AB (ref 140–400)
RBC: 4.38 10*6/uL (ref 4.20–5.82)
RDW: 16.5 % — ABNORMAL HIGH (ref 11.0–14.6)
WBC: 4.3 10*3/uL (ref 4.0–10.3)
lymph#: 1.5 10*3/uL (ref 0.9–3.3)

## 2014-11-05 LAB — CEA: CEA: 1.7 ng/mL (ref 0.0–5.0)

## 2014-11-05 MED ORDER — SODIUM CHLORIDE 0.9 % IJ SOLN
10.0000 mL | INTRAMUSCULAR | Status: DC | PRN
  Administered 2014-11-05: 10 mL via INTRAVENOUS
  Filled 2014-11-05: qty 10

## 2014-11-05 MED ORDER — DEXTROSE 5 % IV SOLN
Freq: Once | INTRAVENOUS | Status: AC
  Administered 2014-11-05: 11:00:00 via INTRAVENOUS

## 2014-11-05 MED ORDER — LEUCOVORIN CALCIUM INJECTION 350 MG
400.0000 mg/m2 | Freq: Once | INTRAVENOUS | Status: AC
  Administered 2014-11-05: 996 mg via INTRAVENOUS
  Filled 2014-11-05: qty 49.8

## 2014-11-05 MED ORDER — PROCHLORPERAZINE MALEATE 10 MG PO TABS
ORAL_TABLET | ORAL | Status: AC
  Filled 2014-11-05: qty 1

## 2014-11-05 MED ORDER — SODIUM CHLORIDE 0.9 % IV SOLN
2400.0000 mg/m2 | INTRAVENOUS | Status: DC
  Administered 2014-11-05: 6000 mg via INTRAVENOUS
  Filled 2014-11-05: qty 120

## 2014-11-05 MED ORDER — PROCHLORPERAZINE MALEATE 10 MG PO TABS
10.0000 mg | ORAL_TABLET | Freq: Four times a day (QID) | ORAL | Status: DC | PRN
  Administered 2014-11-05: 10 mg via ORAL

## 2014-11-05 MED ORDER — OXALIPLATIN CHEMO INJECTION 100 MG/20ML
80.0000 mg/m2 | Freq: Once | INTRAVENOUS | Status: AC
  Administered 2014-11-05: 200 mg via INTRAVENOUS
  Filled 2014-11-05: qty 40

## 2014-11-05 MED ORDER — SODIUM CHLORIDE 0.9 % IV SOLN
Freq: Once | INTRAVENOUS | Status: AC
  Administered 2014-11-05: 11:00:00 via INTRAVENOUS
  Filled 2014-11-05: qty 4

## 2014-11-05 NOTE — Progress Notes (Signed)
[place]  Telephone:(336) [phone] Fax:(336) [phone]  Clinic New Consult Note   Patient Care Team: No Pcp Per Patient as PCP - General (General Practice) 11/05/2014  CHIEF COMPLAINTS Follow up metastatic gastric cancer    Gastric adenocarcinoma: Stage IV   04/23/2014 Imaging CT abdomen showed peritoneal carcinomatosis, ascites, thickning of gastric and GEJ wall, CT chest showed a few small b/l lung nodules.    04/24/2014 Tumor Marker CEA  5.6, CA19.9 13   04/25/2014 Initial Diagnosis Gastric adenocarcinoma: Stage IV   04/25/2014 Pathology Results Stomach mass biopsy showed - ADENOCARCINOMA WITH SIGNET RING FEATURES. ascites cytology (+)   04/25/2014 Procedure EGD: There was a large, nearly circumferential, ulcerated, clearly malignant mass that stradles the GE junction. The vast bulk of the mass lays in the stomach.    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 I saw him first when [facility] He is here for the first office visit after hospital discharge.  He was  admitted on 12/15 with progressive, 4 month history of dysphagia, epigastric abdominal discomfort and bloating, early satiation accompanied by nausea, and postprandial vomiting and diarrhea. He has experienced a 30 lbs weight loss in the process. He has noted increased abdominal girth. Patient denies any shortness of breath, chest pain, GI bleed, hematuria or lower extremity swelling. He denies heavy alcohol intake. Denies tobacco abuse. Denies  risk factors for HIV or hepatitis. He denies any family history of GI malignancies.  CT of the abdomen and pelvis with contrast on 12/15 revealed several pulmonary nodules at the bases bilaterally, profound thickening of the distal esophagus extending beyond the gastroesophageal junction into the proximal stomach,cardia and fundus of the stomach, most evident along the lesser curvature. No pathologic dilatation of small bowel or colon. Extensive upper abdominal and retroperitoneal lymphadenopathy was visualized. Moderate volume of ascites, presumably malignant, was noted. Extensive soft tissue nodularity throughout the omentum, compatible with omental caking and widespread peritoneal metastasis. Additionally, there are other areas of enhancing nodularity along the peritoneal surface. Tiny ventral and umbilical hernias containing predominantly omental fat, including some omental implants. There are no aggressive appearing lytic or blastic lesions noted in the visualized portions of the skeleton.  He underwent an EGD by Dr. JArdis Hughson 04/25/2014, which showed a large fungating mass that straddles the GE junction and occupying the upper 1/3 part of the stomach. Multiple biopsy was taken which showed adenocarcinoma with signet ring features. He also underwent a paracentesis with 4.5 L fluid is removed on 04/26/2014. Cytology was positive for adenocarcinoma with signet ring features, consistent with metastatic disease.  He was discharged to home on 04/27/2014. He did feel a slightly informant of his abdominal bloating after the paracentesis, but not much improvement other symptoms. He had Mediport placement and the second paracentesis was again 4 L fluids removal on 05/02/2014. His appetite is low, he drinks ensure 3 bottles a day, some soup and a very little other foods. His energy level is also low, but able to take care of  all his daily needs, and move around with about much difficulty. His abdominal  discomfort/pain are relatively controlled by morphine 15 mg at night and tramadol 2-3 times during the day. He denies any fever, chills, cough, chest pain or any in the other discomfort.  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 He is doing well clinically. He denies any pain, abdominal distention, nausea or other symptoms. He has good appetite and eating well. No fever or chills. No neuropathy. His energy level is pretty good.  MEDICAL HISTORY:  Past Medical History  Diagnosis Date  . Medical history non-contributory   . gastric ca dx'd 04/2014    SURGICAL HISTORY: Past Surgical History  Procedure Laterality Date  . Squamous cell carcinoma excision  approx March 2015    removed from top of head  . Esophagogastroduodenoscopy (egd) with propofol N/A 04/25/2014    Procedure: ESOPHAGOGASTRODUODENOSCOPY (EGD) WITH PROPOFOL;  Surgeon: Milus Banister, MD;  Location: [facility] END[facility]COPY;  Service: Endoscopy;  Laterality: N/A;    SOCIAL HISTORY: History   Social History  . Marital Status: Single    Spouse Name: N/A  . Number of Children: N/A  . Years of Education: N/A   Occupational History  . Not on file.   Social History Main Topics  . Smoking status: Never Smoker   . Smokeless tobacco: Never Used  . Alcohol Use: No  . Drug Use: No  . Sexual Activity: Not on file   Other Topics Concern  . Not on file   Social History Narrative    FAMILY HISTORY: Family History  Problem Relation Age of Onset  . Breast cancer Sister 35   . Hypertension Mother   . Hypertension Father   Maternal aunt breast cancer at age of 67 Maternal cousin ovarian cancer at age of 5 Maternal ancle prostate cancer age of 13   ALLERGIES:  has No Known Allergies.  MEDICATIONS:  Current Outpatient Prescriptions  Medication Sig Dispense Refill  . docusate sodium 100 MG CAPS Take 100 mg by  mouth 2 (two) times daily. (Patient not taking: Reported on 09/24/2014) 10 capsule 0  . lidocaine-prilocaine (EMLA) cream Apply 1 application topically as needed. 30 g 0  . mirtazapine (REMERON) 15 MG tablet Take 1 tablet (15 mg total) by mouth at bedtime. (Patient not taking: Reported on 09/24/2014) 30 tablet 1  . morphine (MS CONTIN) 15 MG 12 hr tablet Take 1 tablet (15 mg total) by mouth every 12 (twelve) hours. (Patient not taking: Reported on 09/24/2014) 60 tablet 0  . ondansetron (ZOFRAN) 4 MG tablet Take 1 tablet (4 mg total) by mouth every 8 (eight) hours as needed for nausea or vomiting. (Patient not taking: Reported on 09/24/2014) 20 tablet 0  . pantoprazole (PROTONIX) 40 MG tablet Take 1 tablet (40 mg total) by mouth daily. (Patient not taking: Reported on 09/24/2014) 30 tablet 0  . prochlorperazine (COMPAZINE) 10 MG tablet Take 1 tablet (10 mg total) by mouth every 6 (six) hours as needed for nausea or vomiting. (Patient not taking: Reported on 09/24/2014) 30 tablet 3  . traMADol (ULTRAM) 50 MG tablet Take 1-2 tablets (50-100 mg total) by mouth every 6 (six) hours as needed for moderate pain. (Patient not taking: Reported on 09/24/2014) 30 tablet 0   No current facility-administered medications for this visit.    REVIEW OF SYSTEMS:   Constitutional: Denies fevers, chills or  abnormal night sweats, (+) weight loss and anorexia Eyes: Denies blurriness of vision, double vision or watery eyes Ears, nose, mouth, throat, and face: Denies mucositis or sore throat Respiratory: Denies cough, dyspnea or wheezes Cardiovascular: Denies palpitation, chest discomfort or lower extremity swelling Gastrointestinal:  Less nausea, abdominal bloating and discomfort Skin: Denies abnormal skin rashes Lymphatics: Denies new lymphadenopathy or easy bruising Neurological:Denies numbness, tingling or new weaknesses Behavioral/Psych: Mood is stable, no new changes  All other systems were reviewed with the patient  and are negative except those mentioned in the history.  PHYSICAL EXAMINATION: ECOG PERFORMANCE STATUS: 1  GENERAL:alert, no distress and comfortable SKIN: skin color, texture, turgor are normal, no rashes or significant lesions EYES: normal, conjunctiva are pink and non-injected, sclera clear OROPHARYNX:no exudate, no erythema and lips, buccal mucosa, and tongue normal  NECK: supple, thyroid normal size, non-tender, without nodularity LYMPH:  no palpable lymphadenopathy in the cervical, axillary or inguinal LUNGS: clear to auscultation and percussion with normal breathing effort HEART: regular rate & rhythm and no murmurs and no lower extremity edema ABDOMEN:abdomen soft, non-tender and normal bowel sounds, probable small amount of ascites. Musculoskeletal:no cyanosis of digits and no clubbing  PSYCH: alert & oriented x 3 with fluent speech NEURO: no focal motor/sensory deficits  LABORATORY DATA:  I have reviewed the data as listed CBC Latest Ref Rng 10/22/2014 10/08/2014 09/23/2014  WBC 4.0 - 10.3 10e3/uL 5.6 4.1 4.4  Hemoglobin 13.0 - 17.1 g/dL 12.5(L) 12.4(L) 12.7(L)  Hematocrit 38.4 - 49.9 % 38.2(L) 37.8(L) 39.8  Platelets 140 - 400 10e3/uL 155 137(L) 105(L)      CMP Latest Ref Rng 10/22/2014 10/08/2014 09/23/2014  Glucose 70 - 140 mg/dl 98 98 98  BUN 7.0 - 26.0 mg/dL 14.3 17.9 13.6  Creatinine 0.7 - 1.3 mg/dL 0.8 0.8 0.8  Sodium 136 - 145 mEq/L 143 142 143  Potassium 3.5 - 5.1 mEq/L 3.9 4.1 3.8  Chloride 96 - 112 mEq/L - - -  CO2 22 - 29 mEq/L _0 Calcium 8.4 - 10.4 mg/dL 8.6 8.5 8.6  Total Protein 6.4 - 8.3 g/dL 6.5 6.6 6.2(L)  Total Bilirubin 0.20 - 1.20 mg/dL 0.56 0.61 0.50  Alkaline Phos 40 - 150 U/L 147 137 140  AST 5 - 34 U/L _1 ALT 0 - 55 U/L _2 CEA  Status: Finalresult Visible to patient:  MyChart Nextappt: 11/07/2014 at 12:00 PM in Oncology ([facility]) Dx:  Gastric adenocarcinoma: Stage IV            Ref Range 4wk ago  73moago  661mogo     CEA 0.0 - 5.0 ng/mL 1.8 1.7 5.6 (H)CM           Pathology report  Diagnosis  PERITONEAL/ASCITIC FLUID(SPECIMEN 1 OF 1 COLLECTED 04/26/14): METASTATIC ADENOCARCINOMA WITH SIGNET RING CELL FEATURES.  Stomach, biopsy, r/o cancer 04/25/14 - ADENOCARCINOMA WITH SIGNET RING FEATURES. Microscopic Comment The results were called to Dr. JaArdis Hughsn 04/26/2014. (JDP:kh 04/26/14) JOClaudette LawsD Pathologist, Electronic Signature (Case signed 04/26/2014)  HER-2/NEU BY CISH - NO AMPLIFICATION OF HER-2 DETECTED. RESULT RATIO OF HER2: CEP 17 SIGNALS 1.20 AVERAGE HER2 COPY NUMBER PER CELL 2.10  ADDITIONAL INFORMATION: Mismatch Repair (MMR) Protein Immunohistochemistry (IHC) IHC Expression Result: MLH1: Preserved nuclear expression (greater 50% tumor expression) MSH2: Preserved nuclear expression (greater 50% tumor expression) MSH6: Preserved nuclear expression (greater 50% tumor expression) PMS2: Preserved nuclear expression (greater 50% tumor expression) *  Internal control demonstrates intact nuclear expression Interpretation: NORMAL There is preserved expression of the major and minor MMR proteins. There is a very low probability that microsatellite instability (MSI) is present. However, certain clinically significant MMR protein mutations may result in preservation of nuclear expression. It is recommended that the preservation of protein expression be correlated with molecular based MSI testing.  [employer] testing: (+) HC62 and NF1   RADIOGRAPHIC STUDIES: I have personally reviewed the radiological images as listed and agreed with the findings in the report.  Ct Chest, abdomen and pelvis Wo Contrast12/24/2015    IMPRESSION:  1. Findings, as above, highly concerning for either esophageal or gastric primary malignancy, with metastatic lymphadenopathy in the upper abdomen and retroperitoneum, widespread intraperitoneal spread of  disease, malignant ascites, and multiple pulmonary nodules in the visualize lung bases concerning for pulmonary metastasis. Oncologic evaluation is recommended.  2. Additional incidental findings, as above. These results were called by telephone at the time of interpretation on 04/23/2014 at 7:53 pm to [place] , who verbally acknowledged these results.   Electronically Signed   By: Vinnie Langton M.D.   On: 04/23/2014 19:56   CT CAP 06/24/2014 IMPRESSION: 1. Progressive low density and calcifications within the extensive upper abdominal lymphadenopathy, likely related to partial treatment. Some of the lymph nodes do appear slightly larger. The irregular infiltrating mass involving the gastric cardia appears slightly improved. 2. No evidence of hepatic metastatic disease. 3. Overall volume of ascites has improved, although extensive changes of peritoneal carcinomatosis have not significantly changed. 4. No significant change in bilateral pulmonary nodules consistent with metastatic disease. 5. Resolution of bilateral pleural effusions.  CT chest, abdomen and pelvis with contrast on 08/26/2014 IMPRESSION: Overall stable to slight progression of disease. Existing pulmonary nodules are stable to slightly progressed with no new pulmonary nodules evident. The bulky upper abdominal lymphadenopathy is stable to minimally progressed while no substantial change in the primary infiltrating gastric neoplasm is discernible. Omental disease appears stable. There is slightly more ascites on today's study.  CT chest, abdomen and pelvis with contrast on 10/21/2014 IMPRESSION: 1. Compared to the prior examination there appears to be slight progression of disease predominantly manifest by worsening gastric mucosal thickening, particularly along the greater curvature of the stomach in the region of the cardia and fundus, with increasing adjacent mesenteric/omental disease and slight increase  in malignant ascites. Previously described upper abdominal lymphadenopathy is generally stable to slightly decreased compared to the prior examination, and previously noted pulmonary nodules are unchanged. 2. Additional incidental findings, as above.   EGD WITH BIOPSY 04/25/2014 ENDOSCOPIC IMPRESSION: There was a large, nearly circumferential, ulcerated, clearly malignant mass that stradles the GE junction. The vast bulk of the mass lays in the stomach, occupying about 1/3 of the stomach. The mass extends into the esophagus for about 4cm above the GE junction. Mutiple biopsies were taken from the gastric portion of the mass   I ASSESSMENT & PLAN:  39 year old gentleman without significant past medical history presents with abdominal bloating, nausea, vomiting, diarrhea, anemia and weight loss. EGD showed a large mass arising from the gastric fundus and extending into GE junction, biopsy showed adenocarcinoma with signet ring features, CT scan is consistent with peritoneal carcinomatosis and bilateral lung metastasis. Ascites cytology was positive for malignant cells.  1. 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 HER-2 negative, MMR-normal -I discussed his surgical biopsy results, cytology results, skin findings with patient and his  family members, including his parents and 2 sisters. Unfortunately, this is widely metastatic, not curable, and overall prognosis is very poor. Literature has showed gastric adenocarcinoma with signet ring features has overall worse outcome comparing to others. His life expectancy is probably less than 6 months without treatment. -He has started first-line chemotherapy FOLFOX, tolerated well, and his symptoms have much  improved. -His tumor is negative for HER-2 overexpression, no role for trastuzumab. -We also checked his tumor for MMR, which was normal so he would unlikely benefit from immunotherapy.,    -[employer] [facility] genomic test reviewed no actionable mutation -His first restaging CT scan showed mild partial response, overall stable disease. His ascites has significantly decreased. Pleural effusion resolved. He is clinically doing well.  -I discussed his third restaging CT findings with patient and his father. It seems he had a mixed response, slight increase of the gastric wall thickness and the ascites, abdominal lymph nodes are smaller, overall I consider as stable disease or slight disease progression.  -His tumor marker CEA has came down to normalHe is clinically doing well, I recommended continuing FOLFOX. However, if his ascites returns significantly, I'll change him to second line FOLFIRI.  -he was interested in pursuing [place] in [place], which is a intravenous infusion for total of 2 weeks. Giving the slight disease progression on the scan, i.e. recommends to hold it for now.  -due to his family history of malignancy, he has had genetic testing at [place]. It was negative for the comprehensive cancer panel, which included APC, BRCA 1/2, CDE H1, MLH1, MSH2, MSH6, PMS2, etc. -Lab are reviewed, adequate for treatment, we'll proceed cycle 14 chemotherapy today with full 5-FU dose (no bolus).  2. Malignant ascites -resolved now after chemo  -Slightly increased on recent CT CT scan, we'll monitor closely.  3. Anorexia, nausea, malnutrition -Nearly resolved, he feels much better lately -His albumin level is still low, the nutrition secondary to malignancy. -I encouraged him to take protein powder and diet supplements as needed.  4. Anemia, multifactorial (iron deficient anemia, cancer related and chemotherapy induced ) -His ferritin level was normal at 171, but he has low iron and saturation, likely some degree of iron deficient anemia. -He has received IV Feraheme 510 mg twice, 2 weeks apart -Blood transfusion as needed during chemotherapy if her hemoglobin below  8. -Mild anemia has been stable.   Plan #1 cycle 14 FOLFOX today,  No 5-FU bolus, 5-fu full dose, no Neulasta on day 3 with this cycle  #2 RTC in 2 weeks for cycle 14 with APP Lattie Haw    Patient and his family members had many questions. All questions were answered. The patient knows to call the clinic with any problems, questions or concerns.  I spent 20 minutes counseling the patient face to face. The total time spent in the appointment was 25  minutes and more than 50% was on counseling.     Truitt Merle, MD 11/05/2014

## 2014-11-05 NOTE — Patient Instructions (Signed)

## 2014-11-05 NOTE — Patient Instructions (Signed)
[facility] Discharge Instructions for Patients Receiving Chemotherapy  Today you received the following chemotherapy agent: FOLFOX   To help prevent nausea and vomiting after your treatment, we encourage you to take your nausea medication as prescribed.    If you develop nausea and vomiting that is not controlled by your nausea medication, call the clinic.   BELOW ARE SYMPTOMS THAT SHOULD BE REPORTED IMMEDIATELY:  *FEVER GREATER THAN 100.5 F  *CHILLS WITH OR WITHOUT FEVER  NAUSEA AND VOMITING THAT IS NOT CONTROLLED WITH YOUR NAUSEA MEDICATION  *UNUSUAL SHORTNESS OF BREATH  *UNUSUAL BRUISING OR BLEEDING  TENDERNESS IN MOUTH AND THROAT WITH OR WITHOUT PRESENCE OF ULCERS  *URINARY PROBLEMS  *BOWEL PROBLEMS  UNUSUAL RASH Items with * indicate a potential emergency and should be followed up as soon as possible.  Feel free to call the clinic you have any questions or concerns. The clinic phone number is [phone].  Please show the [place] at check-in to the Emergency Department and triage nurse.

## 2014-11-07 ENCOUNTER — Ambulatory Visit (HOSPITAL_BASED_OUTPATIENT_CLINIC_OR_DEPARTMENT_OTHER): Payer: BLUE CROSS/BLUE SHIELD

## 2014-11-07 VITALS — BP 123/60 | HR 55 | Temp 97.5°F | Resp 18

## 2014-11-07 DIAGNOSIS — C169 Malignant neoplasm of stomach, unspecified: Secondary | ICD-10-CM

## 2014-11-07 MED ORDER — HEPARIN SOD (PORK) LOCK FLUSH 100 UNIT/ML IV SOLN
500.0000 [IU] | Freq: Once | INTRAVENOUS | Status: AC | PRN
  Administered 2014-11-07: 500 [IU]
  Filled 2014-11-07: qty 5

## 2014-11-07 MED ORDER — SODIUM CHLORIDE 0.9 % IJ SOLN
10.0000 mL | INTRAMUSCULAR | Status: DC | PRN
  Administered 2014-11-07: 10 mL
  Filled 2014-11-07: qty 10

## 2014-11-07 NOTE — Patient Instructions (Signed)
Fluorouracil, 5FU; Diclofenac topical cream What is this medicine? FLUOROURACIL; DICLOFENAC (flure oh YOOR a sil; dye KLOE fen ak) is a combination of a topical chemotherapy agent and non-steroidal anti-inflammatory drug (NSAID). It is used on the skin to treat skin cancer and skin conditions that could become cancer. This medicine may be used for other purposes; ask your health care provider or pharmacist if you have questions. COMMON BRAND NAME(S): FLUORAC What should I tell my health care provider before I take this medicine? They need to know if you have any of these conditions: -bleeding problems -cigarette smoker -DPD enzyme deficiency -heart disease -high blood pressure -if you frequently drink alcohol containing drinks -kidney disease -liver disease -open or infected skin -stomach problems -swelling or open sores at the treatment site -recent or planned coronary artery bypass graft (CABG) surgery -an unusual or allergic reaction to fluorouracil, diclofenac, aspirin, other NSAIDs, other medicines, foods, dyes, or preservatives -pregnant or trying to get pregnant -breast-feeding How should I use this medicine? This medicine is only for use on the skin. Follow the directions on the prescription label. Wash hands before and after use. Wash affected area and gently pat dry. To apply this medicine use a cotton-tipped applicator, or use gloves if applying with fingertips. If applied with unprotected fingertips, it is very important to wash your hands well after you apply this medicine. Avoid applying to the eyes, nose, or mouth. Apply enough medicine to cover the affected area. You can cover the area with a light gauze dressing, but do not use tight or air-tight dressings. Finish the full course prescribed by your doctor or health care professional, even if you think your condition is better. Do not stop taking except on the advice of your doctor or health care professional. Talk to your  pediatrician regarding the use of this medicine in children. Special care may be needed. Overdosage: If you think you've taken too much of this medicine contact a poison control center or emergency room at once. Overdosage: If you think you have taken too much of this medicine contact a poison control center or emergency room at once. NOTE: This medicine is only for you. Do not share this medicine with others. What if I miss a dose? If you miss a dose, apply it as soon as you can. If it is almost time for your next dose, only use that dose. Do not apply extra doses. Contact your doctor or health care professional if you miss more than one dose. What may interact with this medicine? Interactions are not expected. Do not use any other skin products without telling your doctor or health care professional. This list may not describe all possible interactions. Give your health care provider a list of all the medicines, herbs, non-prescription drugs, or dietary supplements you use. Also tell them if you smoke, drink alcohol, or use illegal drugs. Some items may interact with your medicine. What should I watch for while using this medicine? Visit your doctor or health care professional for checks on your progress. You will need to use this medicine for 2 to 6 weeks. This may be longer depending on the condition being treated. You may not see full healing for another 1 to 2 months after you stop using the medicine. Treated areas of skin can look unsightly during and for several weeks after treatment with this medicine. This medicine can make you more sensitive to the sun. Keep out of the sun. If you cannot avoid being in   the sun, wear protective clothing and use sunscreen. Do not use sun lamps or tanning beds/booths. Where should I keep my What side effects may I notice from receiving this medicine? Side effects that you should report to your doctor or health care professional as soon as possible: -allergic  reactions like skin rash, itching or hives, swelling of the face, lips, or tongue -black or bloody stools, blood in the urine or vomit -blurred vision -chest pain -difficulty breathing or wheezing -redness, blistering, peeling or loosening of the skin, including inside the mouth -severe redness and swelling of normal skin -slurred speech or weakness on one side of the body -trouble passing urine or change in the amount of urine -unexplained weight gain or swelling -unusually weak or tired -yellowing of eyes or skin Side effects that usually do not require medical attention (Report these to your doctor or health care professional if they continue or are bothersome.): -increased sensitivity of the skin to sun and ultraviolet light -pain and burning of the affected area -scaling or swelling of the affected area -skin rash, itching of the affected area -tenderness This list may not describe all possible side effects. Call your doctor for medical advice about side effects. You may report side effects to FDA at 1-800-FDA-1088. Where should I keep my medicine? Keep out of the reach of children. Store at room temperature between 20 and 25 degrees C (68 and 77 degrees F). Throw away any unused medicine after the expiration date. NOTE: This sheet is a summary. It may not cover all possible information. If you have questions about this medicine, talk to your doctor, pharmacist, or health care provider.  2015, [employer] Standard. (2013-08-27 11:09:58)  

## 2014-11-19 ENCOUNTER — Other Ambulatory Visit (HOSPITAL_BASED_OUTPATIENT_CLINIC_OR_DEPARTMENT_OTHER): Payer: BLUE CROSS/BLUE SHIELD

## 2014-11-19 ENCOUNTER — Ambulatory Visit (HOSPITAL_BASED_OUTPATIENT_CLINIC_OR_DEPARTMENT_OTHER): Payer: BLUE CROSS/BLUE SHIELD | Admitting: Nurse Practitioner

## 2014-11-19 ENCOUNTER — Ambulatory Visit (HOSPITAL_BASED_OUTPATIENT_CLINIC_OR_DEPARTMENT_OTHER): Payer: BLUE CROSS/BLUE SHIELD

## 2014-11-19 VITALS — BP 149/81 | HR 78 | Temp 97.8°F | Resp 18 | Ht 69.0 in | Wt 288.6 lb

## 2014-11-19 DIAGNOSIS — C786 Secondary malignant neoplasm of retroperitoneum and peritoneum: Secondary | ICD-10-CM

## 2014-11-19 DIAGNOSIS — C7801 Secondary malignant neoplasm of right lung: Secondary | ICD-10-CM

## 2014-11-19 DIAGNOSIS — D649 Anemia, unspecified: Secondary | ICD-10-CM

## 2014-11-19 DIAGNOSIS — C169 Malignant neoplasm of stomach, unspecified: Secondary | ICD-10-CM | POA: Diagnosis not present

## 2014-11-19 DIAGNOSIS — R180 Malignant ascites: Secondary | ICD-10-CM

## 2014-11-19 DIAGNOSIS — C7802 Secondary malignant neoplasm of left lung: Secondary | ICD-10-CM

## 2014-11-19 DIAGNOSIS — Z5111 Encounter for antineoplastic chemotherapy: Secondary | ICD-10-CM

## 2014-11-19 DIAGNOSIS — C801 Malignant (primary) neoplasm, unspecified: Secondary | ICD-10-CM

## 2014-11-19 LAB — CBC WITH DIFFERENTIAL/PLATELET
BASO%: 0.3 % (ref 0.0–2.0)
Basophils Absolute: 0 10*3/uL (ref 0.0–0.1)
EOS%: 1.5 % (ref 0.0–7.0)
Eosinophils Absolute: 0.1 10*3/uL (ref 0.0–0.5)
HCT: 38 % — ABNORMAL LOW (ref 38.4–49.9)
HGB: 12.7 g/dL — ABNORMAL LOW (ref 13.0–17.1)
LYMPH%: 31.2 % (ref 14.0–49.0)
MCH: 29.3 pg (ref 27.2–33.4)
MCHC: 33.4 g/dL (ref 32.0–36.0)
MCV: 87.8 fL (ref 79.3–98.0)
MONO#: 0.8 10*3/uL (ref 0.1–0.9)
MONO%: 21.3 % — AB (ref 0.0–14.0)
NEUT%: 45.7 % (ref 39.0–75.0)
NEUTROS ABS: 1.8 10*3/uL (ref 1.5–6.5)
Platelets: 118 10*3/uL — ABNORMAL LOW (ref 140–400)
RBC: 4.33 10*6/uL (ref 4.20–5.82)
RDW: 16.5 % — ABNORMAL HIGH (ref 11.0–14.6)
WBC: 3.9 10*3/uL — ABNORMAL LOW (ref 4.0–10.3)
lymph#: 1.2 10*3/uL (ref 0.9–3.3)

## 2014-11-19 LAB — COMPREHENSIVE METABOLIC PANEL (CC13)
ALT: 14 U/L (ref 0–55)
ANION GAP: 6 meq/L (ref 3–11)
AST: 24 U/L (ref 5–34)
Albumin: 2.8 g/dL — ABNORMAL LOW (ref 3.5–5.0)
Alkaline Phosphatase: 162 U/L — ABNORMAL HIGH (ref 40–150)
BUN: 15.2 mg/dL (ref 7.0–26.0)
CO2: 27 mEq/L (ref 22–29)
CREATININE: 0.8 mg/dL (ref 0.7–1.3)
Calcium: 8.9 mg/dL (ref 8.4–10.4)
Chloride: 108 mEq/L (ref 98–109)
EGFR: >90 mL/min/{1.73_m2} (ref >90)
Glucose: 112 mg/dl (ref 70–140)
Potassium: 3.9 mEq/L (ref 3.5–5.1)
Sodium: 141 mEq/L (ref 136–145)
Total Bilirubin: 0.46 mg/dL (ref 0.20–1.20)
Total Protein: 6.5 g/dL (ref 6.4–8.3)

## 2014-11-19 MED ORDER — SODIUM CHLORIDE 0.9 % IV SOLN
2280.0000 mg/m2 | INTRAVENOUS | Status: DC
  Administered 2014-11-19: 5700 mg via INTRAVENOUS
  Filled 2014-11-19: qty 114

## 2014-11-19 MED ORDER — PROCHLORPERAZINE MALEATE 10 MG PO TABS
ORAL_TABLET | ORAL | Status: AC
  Filled 2014-11-19: qty 1

## 2014-11-19 MED ORDER — LEUCOVORIN CALCIUM INJECTION 350 MG
400.0000 mg/m2 | Freq: Once | INTRAMUSCULAR | Status: AC
  Administered 2014-11-19: 996 mg via INTRAVENOUS
  Filled 2014-11-19: qty 49.8

## 2014-11-19 MED ORDER — OXALIPLATIN CHEMO INJECTION 100 MG/20ML
80.0000 mg/m2 | Freq: Once | INTRAVENOUS | Status: AC
  Administered 2014-11-19: 200 mg via INTRAVENOUS
  Filled 2014-11-19: qty 40

## 2014-11-19 MED ORDER — DEXTROSE 5 % IV SOLN
Freq: Once | INTRAVENOUS | Status: AC
  Administered 2014-11-19: 11:00:00 via INTRAVENOUS

## 2014-11-19 MED ORDER — SODIUM CHLORIDE 0.9 % IV SOLN
Freq: Once | INTRAVENOUS | Status: AC
  Administered 2014-11-19: 12:00:00 via INTRAVENOUS
  Filled 2014-11-19: qty 4

## 2014-11-19 MED ORDER — SODIUM CHLORIDE 0.9 % IJ SOLN
10.0000 mL | INTRAMUSCULAR | Status: DC | PRN
  Filled 2014-11-19: qty 10

## 2014-11-19 MED ORDER — HEPARIN SOD (PORK) LOCK FLUSH 100 UNIT/ML IV SOLN
500.0000 [IU] | Freq: Once | INTRAVENOUS | Status: DC | PRN
  Filled 2014-11-19: qty 5

## 2014-11-19 MED ORDER — PROCHLORPERAZINE MALEATE 10 MG PO TABS
10.0000 mg | ORAL_TABLET | Freq: Four times a day (QID) | ORAL | Status: DC | PRN
  Administered 2014-11-19: 10 mg via ORAL

## 2014-11-19 NOTE — Progress Notes (Signed)
  [place] OFFICE PROGRESS NOTE     Gastric adenocarcinoma: Stage IV   04/23/2014 Imaging CT abdomen showed peritoneal carcinomatosis, ascites, thickning of gastric and GEJ wall, CT chest showed a few small b/l lung nodules.    04/24/2014 Tumor Marker CEA 5.6, CA19.9 13   04/25/2014 Initial Diagnosis Gastric adenocarcinoma: Stage IV   04/25/2014 Pathology Results Stomach mass biopsy showed - ADENOCARCINOMA WITH SIGNET RING FEATURES. ascites cytology (+)   04/25/2014 Procedure EGD: There was a large, nearly circumferential, ulcerated, clearly malignant mass that stradles the GE junction. The vast bulk of the mass lays in the stomach.    05/07/2014 -  Chemotherapy mFOLFOX, with neulasta as needed, bolus 5FU omitted from cycle 6 due to cytopenia    06/03/2014 Miscellaneous [facility] genetic test (+) for: NF1 splice site [id number] del 47 TP53 R213*    CURRENT THERAPY: mFOLFOX6 started on 05/08/2015, 5% dose reduction and no 5-fu bolus due to thrombocytopenia since cycle 6          INTERVAL HISTORY:   Samuel Durham returns as scheduled. He completed cycle 14 FOLFOX 11/05/2014. He denies nausea/vomiting. No mouth sores. No diarrhea. No numbness or tingling in his hands or feet. He reports a good appetite. No pain.  Objective:  Vital signs in last 24 hours:  Blood pressure 149/81, pulse 78, temperature 97.8 F (36.6 C), temperature source Oral, resp. rate 18, height $RemoveBe'5\' 9"'VfzjQeDEw$  (1.753 m), weight 288 lb 9.6 oz (130.908 kg), SpO2 100 %.    HEENT: No thrush or ulcers. Lymphatics: No palpable cervical or subclavicular lymph nodes. Resp: Lungs clear bilaterally. Cardio: Regular rate and rhythm. GI: Soft and nontender. No mass. No organomegaly. Vascular: No leg edema. Neuro: Vibratory sense moderately decreased over the fingertips per tuning fork exam.  Port-A-Cath without erythema.   Lab Results  Component Value Date   WBC 3.9* 11/19/2014   HGB 12.7* 11/19/2014   HCT 38.0* 11/19/2014   MCV 87.8 11/19/2014   PLT 118* 11/19/2014   NEUTROABS 1.8 11/19/2014    Imaging:  No results found.  Medications: I have reviewed the patient's current medications.  Assessment/Plan: 1. 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 HER-2 negative, MMR-normal. Currently on active treatment with FOLFOX (no 5-FU bolus). Completed cycle 14 11/05/2014. 2. Malignant ascites improved with chemotherapy. Slight increase on CT 10/21/2014. 3. Anemia, multifactorial. Stable.    Disposition: Samuel Durham appears stable. He has competed 14 cycles of FOLFOX. Plan to proceed with cycle 15 today as scheduled. He will return for a follow-up visit and cycle 16 in 2 weeks. He will contact the office in the interim with any problems.   Plan reviewed with Dr. Burr Medico.     Ned Card ANP/GNP-BC   11/19/2014  10:13 AM

## 2014-11-19 NOTE — Progress Notes (Signed)
Per Ned Card it is okay to treat pt today with chemo and today's platelet count.

## 2014-11-19 NOTE — Patient Instructions (Signed)
[facility] Discharge Instructions for Patients Receiving Chemotherapy  Today you received the following chemotherapy agent: FOLFOX   To help prevent nausea and vomiting after your treatment, we encourage you to take your nausea medication as prescribed.    If you develop nausea and vomiting that is not controlled by your nausea medication, call the clinic.   BELOW ARE SYMPTOMS THAT SHOULD BE REPORTED IMMEDIATELY:  *FEVER GREATER THAN 100.5 F  *CHILLS WITH OR WITHOUT FEVER  NAUSEA AND VOMITING THAT IS NOT CONTROLLED WITH YOUR NAUSEA MEDICATION  *UNUSUAL SHORTNESS OF BREATH  *UNUSUAL BRUISING OR BLEEDING  TENDERNESS IN MOUTH AND THROAT WITH OR WITHOUT PRESENCE OF ULCERS  *URINARY PROBLEMS  *BOWEL PROBLEMS  UNUSUAL RASH Items with * indicate a potential emergency and should be followed up as soon as possible.  Feel free to call the clinic you have any questions or concerns. The clinic phone number is [phone].  Please show the [facility] at check-in to the Emergency Department and triage nurse.

## 2014-11-20 ENCOUNTER — Telehealth: Payer: Self-pay | Admitting: *Deleted

## 2014-11-20 ENCOUNTER — Telehealth: Payer: Self-pay | Admitting: Hematology

## 2014-11-20 NOTE — Telephone Encounter (Signed)
Pt confirmed labs/ov per 07/12 POF, gave pt AVS..... Pt will be given updated schedule at next visit due to wasn't in box before pt went to treatment... KJ

## 2014-11-20 NOTE — Telephone Encounter (Signed)
Per staff message and POF I have scheduled appts. Advised scheduler of appts. JMW  

## 2014-11-21 ENCOUNTER — Ambulatory Visit (HOSPITAL_BASED_OUTPATIENT_CLINIC_OR_DEPARTMENT_OTHER): Payer: BLUE CROSS/BLUE SHIELD

## 2014-11-21 VITALS — BP 110/46 | HR 72 | Temp 97.9°F

## 2014-11-21 DIAGNOSIS — C786 Secondary malignant neoplasm of retroperitoneum and peritoneum: Secondary | ICD-10-CM | POA: Diagnosis not present

## 2014-11-21 DIAGNOSIS — C7801 Secondary malignant neoplasm of right lung: Secondary | ICD-10-CM

## 2014-11-21 DIAGNOSIS — C169 Malignant neoplasm of stomach, unspecified: Secondary | ICD-10-CM

## 2014-11-21 DIAGNOSIS — C7802 Secondary malignant neoplasm of left lung: Secondary | ICD-10-CM | POA: Diagnosis not present

## 2014-11-21 MED ORDER — SODIUM CHLORIDE 0.9 % IJ SOLN
10.0000 mL | INTRAMUSCULAR | Status: DC | PRN
  Administered 2014-11-21: 10 mL
  Filled 2014-11-21: qty 10

## 2014-11-21 MED ORDER — HEPARIN SOD (PORK) LOCK FLUSH 100 UNIT/ML IV SOLN
500.0000 [IU] | Freq: Once | INTRAVENOUS | Status: AC | PRN
  Administered 2014-11-21: 500 [IU]
  Filled 2014-11-21: qty 5

## 2014-11-21 NOTE — Patient Instructions (Signed)
Fluorouracil, 5-FU injection What is this medicine? FLUOROURACIL, 5-FU (flure oh YOOR a sil) is a chemotherapy drug. It slows the growth of cancer cells. This medicine is used to treat many types of cancer like breast cancer, colon or rectal cancer, pancreatic cancer, and stomach cancer. This medicine may be used for other purposes; ask your health care provider or pharmacist if you have questions. COMMON BRAND NAME(S): Adrucil What should I tell my health care provider before I take this medicine? They need to know if you have any of these conditions: -blood disorders -dihydropyrimidine dehydrogenase (DPD) deficiency -infection (especially a virus infection such as chickenpox, cold sores, or herpes) -kidney disease -liver disease -malnourished, poor nutrition -recent or ongoing radiation therapy -an unusual or allergic reaction to fluorouracil, other chemotherapy, other medicines, foods, dyes, or preservatives -pregnant or trying to get pregnant -breast-feeding How should I use this medicine? This drug is given as an infusion or injection into a vein. It is administered in a hospital or clinic by a specially trained health care professional. Talk to your pediatrician regarding the use of this medicine in children. Special care may be needed. Overdosage: If you think you have taken too much of this medicine contact a poison control center or emergency room at once. NOTE: This medicine is only for you. Do not share this medicine with others. What if I miss a dose? It is important not to miss your dose. Call your doctor or health care professional if you are unable to keep an appointment. What may interact with this medicine? -allopurinol -cimetidine -dapsone -digoxin -hydroxyurea -leucovorin -levamisole -medicines for seizures like ethotoin, fosphenytoin, phenytoin -medicines to increase blood counts like filgrastim, pegfilgrastim, sargramostim -medicines that treat or prevent blood  clots like warfarin, enoxaparin, and dalteparin -methotrexate -metronidazole -pyrimethamine -some other chemotherapy drugs like busulfan, cisplatin, estramustine, vinblastine -trimethoprim -trimetrexate -vaccines Talk to your doctor or health care professional before taking any of these medicines: -acetaminophen -aspirin -ibuprofen -ketoprofen -naproxen This list may not describe all possible interactions. Give your health care provider a list of all the medicines, herbs, non-prescription drugs, or dietary supplements you use. Also tell them if you smoke, drink alcohol, or use illegal drugs. Some items may interact with your medicine. What should I watch for while using this medicine? Visit your doctor for checks on your progress. This drug may make you feel generally unwell. This is not uncommon, as chemotherapy can affect healthy cells as well as cancer cells. Report any side effects. Continue your course of treatment even though you feel ill unless your doctor tells you to stop. In some cases, you may be given additional medicines to help with side effects. Follow all directions for their use. Call your doctor or health care professional for advice if you get a fever, chills or sore throat, or other symptoms of a cold or flu. Do not treat yourself. This drug decreases your body's ability to fight infections. Try to avoid being around people who are sick. This medicine may increase your risk to bruise or bleed. Call your doctor or health care professional if you notice any unusual bleeding. Be careful brushing and flossing your teeth or using a toothpick because you may get an infection or bleed more easily. If you have any dental work done, tell your dentist you are receiving this medicine. Avoid taking products that contain aspirin, acetaminophen, ibuprofen, naproxen, or ketoprofen unless instructed by your doctor. These medicines may hide a fever. Do not become pregnant while taking this    medicine. Women should inform their doctor if they wish to become pregnant or think they might be pregnant. There is a potential for serious side effects to an unborn child. Talk to your health care professional or pharmacist for more information. Do not breast-feed an infant while taking this medicine. Men should inform their doctor if they wish to father a child. This medicine may lower sperm counts. Do not treat diarrhea with over the counter products. Contact your doctor if you have diarrhea that lasts more than 2 days or if it is severe and watery. This medicine can make you more sensitive to the sun. Keep out of the sun. If you cannot avoid being in the sun, wear protective clothing and use sunscreen. Do not use sun lamps or tanning beds/booths. What side effects may I notice from receiving this medicine? Side effects that you should report to your doctor or health care professional as soon as possible: -allergic reactions like skin rash, itching or hives, swelling of the face, lips, or tongue -low blood counts - this medicine may decrease the number of white blood cells, red blood cells and platelets. You may be at increased risk for infections and bleeding. -signs of infection - fever or chills, cough, sore throat, pain or difficulty passing urine -signs of decreased platelets or bleeding - bruising, pinpoint red spots on the skin, black, tarry stools, blood in the urine -signs of decreased red blood cells - unusually weak or tired, fainting spells, lightheadedness -breathing problems -changes in vision -chest pain -mouth sores -nausea and vomiting -pain, swelling, redness at site where injected -pain, tingling, numbness in the hands or feet -redness, swelling, or sores on hands or feet -stomach pain -unusual bleeding Side effects that usually do not require medical attention (report to your doctor or health care professional if they continue or are bothersome): -changes in finger or  toe nails -diarrhea -dry or itchy skin -hair loss -headache -loss of appetite -sensitivity of eyes to the light -stomach upset -unusually teary eyes This list may not describe all possible side effects. Call your doctor for medical advice about side effects. You may report side effects to FDA at 1-800-FDA-1088. Where should I keep my medicine? This drug is given in a hospital or clinic and will not be stored at home. NOTE: This sheet is a summary. It may not cover all possible information. If you have questions about this medicine, talk to your doctor, pharmacist, or health care provider.  2015, Elsevier/Gold Standard. (2007-08-30 13:53:16)   

## 2014-11-26 ENCOUNTER — Telehealth: Payer: Self-pay | Admitting: Hematology

## 2014-11-26 NOTE — Telephone Encounter (Signed)
FAXED RECORDS TO [facility]

## 2014-11-28 ENCOUNTER — Other Ambulatory Visit: Payer: Self-pay | Admitting: Hematology

## 2014-12-03 ENCOUNTER — Other Ambulatory Visit (HOSPITAL_BASED_OUTPATIENT_CLINIC_OR_DEPARTMENT_OTHER): Payer: BLUE CROSS/BLUE SHIELD

## 2014-12-03 ENCOUNTER — Encounter: Payer: Self-pay | Admitting: Hematology

## 2014-12-03 ENCOUNTER — Ambulatory Visit (HOSPITAL_BASED_OUTPATIENT_CLINIC_OR_DEPARTMENT_OTHER): Payer: BLUE CROSS/BLUE SHIELD

## 2014-12-03 ENCOUNTER — Ambulatory Visit: Payer: BLUE CROSS/BLUE SHIELD

## 2014-12-03 ENCOUNTER — Other Ambulatory Visit: Payer: BLUE CROSS/BLUE SHIELD

## 2014-12-03 ENCOUNTER — Ambulatory Visit (HOSPITAL_BASED_OUTPATIENT_CLINIC_OR_DEPARTMENT_OTHER): Payer: BLUE CROSS/BLUE SHIELD | Admitting: Hematology

## 2014-12-03 VITALS — BP 132/87 | HR 69 | Temp 98.3°F | Resp 18 | Ht 69.0 in | Wt 289.8 lb

## 2014-12-03 DIAGNOSIS — C786 Secondary malignant neoplasm of retroperitoneum and peritoneum: Secondary | ICD-10-CM | POA: Diagnosis not present

## 2014-12-03 DIAGNOSIS — C169 Malignant neoplasm of stomach, unspecified: Secondary | ICD-10-CM | POA: Diagnosis not present

## 2014-12-03 DIAGNOSIS — C7801 Secondary malignant neoplasm of right lung: Secondary | ICD-10-CM

## 2014-12-03 DIAGNOSIS — Z5111 Encounter for antineoplastic chemotherapy: Secondary | ICD-10-CM

## 2014-12-03 DIAGNOSIS — Z95828 Presence of other vascular implants and grafts: Secondary | ICD-10-CM

## 2014-12-03 DIAGNOSIS — Z803 Family history of malignant neoplasm of breast: Secondary | ICD-10-CM

## 2014-12-03 DIAGNOSIS — C7802 Secondary malignant neoplasm of left lung: Secondary | ICD-10-CM

## 2014-12-03 DIAGNOSIS — D509 Iron deficiency anemia, unspecified: Secondary | ICD-10-CM

## 2014-12-03 DIAGNOSIS — D6481 Anemia due to antineoplastic chemotherapy: Secondary | ICD-10-CM

## 2014-12-03 DIAGNOSIS — R110 Nausea: Secondary | ICD-10-CM

## 2014-12-03 DIAGNOSIS — D638 Anemia in other chronic diseases classified elsewhere: Secondary | ICD-10-CM

## 2014-12-03 DIAGNOSIS — E46 Unspecified protein-calorie malnutrition: Secondary | ICD-10-CM

## 2014-12-03 DIAGNOSIS — R630 Anorexia: Secondary | ICD-10-CM

## 2014-12-03 DIAGNOSIS — Z800 Family history of malignant neoplasm of digestive organs: Secondary | ICD-10-CM

## 2014-12-03 LAB — CBC WITH DIFFERENTIAL/PLATELET
BASO%: 0.7 % (ref 0.0–2.0)
Basophils Absolute: 0 10*3/uL (ref 0.0–0.1)
EOS ABS: 0.1 10*3/uL (ref 0.0–0.5)
EOS%: 2 % (ref 0.0–7.0)
HCT: 37.1 % — ABNORMAL LOW (ref 38.4–49.9)
HGB: 12.3 g/dL — ABNORMAL LOW (ref 13.0–17.1)
LYMPH#: 1.5 10*3/uL (ref 0.9–3.3)
LYMPH%: 37.4 % (ref 14.0–49.0)
MCH: 29.1 pg (ref 27.2–33.4)
MCHC: 33.2 g/dL (ref 32.0–36.0)
MCV: 87.7 fL (ref 79.3–98.0)
MONO#: 0.8 10*3/uL (ref 0.1–0.9)
MONO%: 18.8 % — ABNORMAL HIGH (ref 0.0–14.0)
NEUT#: 1.7 10*3/uL (ref 1.5–6.5)
NEUT%: 41.1 % (ref 39.0–75.0)
Platelets: 122 10*3/uL — ABNORMAL LOW (ref 140–400)
RBC: 4.23 10*6/uL (ref 4.20–5.82)
RDW: 16.7 % — ABNORMAL HIGH (ref 11.0–14.6)
WBC: 4 10*3/uL (ref 4.0–10.3)

## 2014-12-03 LAB — CEA: CEA: 1.8 ng/mL (ref 0.0–5.0)

## 2014-12-03 LAB — COMPREHENSIVE METABOLIC PANEL (CC13)
ALT: 15 U/L (ref 0–55)
AST: 26 U/L (ref 5–34)
Albumin: 2.8 g/dL — ABNORMAL LOW (ref 3.5–5.0)
Alkaline Phosphatase: 154 U/L — ABNORMAL HIGH (ref 40–150)
Anion Gap: 5 mEq/L (ref 3–11)
BUN: 15.1 mg/dL (ref 7.0–26.0)
CO2: 26 mEq/L (ref 22–29)
Calcium: 8.5 mg/dL (ref 8.4–10.4)
Chloride: 108 mEq/L (ref 98–109)
Creatinine: 0.8 mg/dL (ref 0.7–1.3)
Glucose: 101 mg/dl (ref 70–140)
POTASSIUM: 3.8 meq/L (ref 3.5–5.1)
Sodium: 139 mEq/L (ref 136–145)
TOTAL PROTEIN: 6.5 g/dL (ref 6.4–8.3)
Total Bilirubin: 0.43 mg/dL (ref 0.20–1.20)

## 2014-12-03 MED ORDER — DEXTROSE 5 % IV SOLN
Freq: Once | INTRAVENOUS | Status: AC
  Administered 2014-12-03: 10:00:00 via INTRAVENOUS

## 2014-12-03 MED ORDER — PROCHLORPERAZINE MALEATE 10 MG PO TABS
10.0000 mg | ORAL_TABLET | Freq: Four times a day (QID) | ORAL | Status: DC | PRN
  Administered 2014-12-03: 10 mg via ORAL

## 2014-12-03 MED ORDER — SODIUM CHLORIDE 0.9 % IV SOLN
2280.0000 mg/m2 | INTRAVENOUS | Status: DC
  Administered 2014-12-03: 5700 mg via INTRAVENOUS
  Filled 2014-12-03: qty 114

## 2014-12-03 MED ORDER — OXALIPLATIN CHEMO INJECTION 100 MG/20ML
80.0000 mg/m2 | Freq: Once | INTRAVENOUS | Status: AC
  Administered 2014-12-03: 200 mg via INTRAVENOUS
  Filled 2014-12-03: qty 40

## 2014-12-03 MED ORDER — SODIUM CHLORIDE 0.9 % IV SOLN
Freq: Once | INTRAVENOUS | Status: AC
  Administered 2014-12-03: 10:00:00 via INTRAVENOUS
  Filled 2014-12-03: qty 4

## 2014-12-03 MED ORDER — LEUCOVORIN CALCIUM INJECTION 350 MG
400.0000 mg/m2 | Freq: Once | INTRAVENOUS | Status: AC
  Administered 2014-12-03: 996 mg via INTRAVENOUS
  Filled 2014-12-03: qty 49.8

## 2014-12-03 MED ORDER — PROCHLORPERAZINE MALEATE 10 MG PO TABS
ORAL_TABLET | ORAL | Status: AC
  Filled 2014-12-03: qty 1

## 2014-12-03 MED ORDER — SODIUM CHLORIDE 0.9 % IJ SOLN
10.0000 mL | INTRAMUSCULAR | Status: DC | PRN
  Administered 2014-12-03: 10 mL via INTRAVENOUS
  Filled 2014-12-03: qty 10

## 2014-12-03 NOTE — Patient Instructions (Signed)

## 2014-12-03 NOTE — Progress Notes (Signed)
[place]  [phone] [phone]  Clinic New Consult Note   Patient Care Team: No Pcp Per Patient as PCP - General (General Practice) 12/03/2014  CHIEF COMPLAINTS Follow up metastatic gastric cancer    Gastric adenocarcinoma: Stage IV   04/23/2014 Imaging CT abdomen showed peritoneal carcinomatosis, ascites, thickning of gastric and GEJ wall, CT chest showed a few small b/l lung nodules.    04/24/2014 Tumor Marker CEA  5.6, CA19.9 13   04/25/2014 Initial Diagnosis Gastric adenocarcinoma: Stage IV   04/25/2014 Pathology Results Stomach mass biopsy showed - ADENOCARCINOMA WITH SIGNET RING FEATURES. ascites cytology (+)   04/25/2014 Procedure EGD: There was a large, nearly circumferential, ulcerated, clearly malignant mass that stradles the GE junction. The vast bulk of the mass lays in the stomach.    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 I [facility] him [facility] He is here for the first office visit after hospital discharge.  He was  admitted on 12/15 with progressive, 4 month history of dysphagia, epigastric abdominal discomfort and bloating, early satiation accompanied by nausea, and postprandial vomiting and diarrhea. He has experienced a 30 lbs weight loss in the process. He has noted increased abdominal girth. Patient denies any shortness of breath, chest pain, GI bleed, hematuria or lower extremity swelling. He denies heavy alcohol intake. Denies tobacco abuse. Denies  risk factors for HIV or hepatitis. He denies any family history of GI malignancies.  CT of the abdomen and pelvis with contrast on 12/15 revealed several pulmonary nodules at the bases bilaterally, profound thickening of the distal esophagus extending beyond the gastroesophageal junction into the proximal stomach,cardia and fundus of the stomach, most evident along the lesser curvature. No pathologic dilatation of small bowel or colon. Extensive upper abdominal and retroperitoneal lymphadenopathy was visualized. Moderate volume of ascites, presumably malignant, was noted. Extensive soft tissue nodularity throughout the omentum, compatible with omental caking and widespread peritoneal metastasis. Additionally, there are other areas of enhancing nodularity along the peritoneal surface. Tiny ventral and umbilical hernias containing predominantly omental fat, including some omental implants. There are no aggressive appearing lytic or blastic lesions noted in the visualized portions of the skeleton.  He underwent an EGD by Dr. JArdis Hughson 04/25/2014, which showed a large fungating mass that straddles the GE junction and occupying the upper 1/3 part of the stomach. Multiple biopsy was taken which showed adenocarcinoma with signet ring features. He also underwent a paracentesis with 4.5 L fluid is removed on 04/26/2014. Cytology was positive for adenocarcinoma with signet ring features, consistent with metastatic disease.  He was discharged to home on 04/27/2014. He did feel a slightly informant of his abdominal bloating after the paracentesis, but not much improvement other symptoms. He had Mediport placement and the second paracentesis was again 4 L fluids removal on 05/02/2014. His appetite is low, he drinks ensure 3 bottles a day, some soup and a very little other foods. His energy level is also low, but able to take care of  all his daily needs, and move around with about much difficulty. His abdominal  discomfort/pain are relatively controlled by morphine 15 mg at night and tramadol 2-3 times during the day. He denies any fever, chills, cough, chest pain or any in the other discomfort.  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 He is doing well clinically and tolerating chemo well. No neuropathy, he has no nausea, appetite good, no significant abdominal distention. No fever or chills or bleeding. He is scheduled to see Dr. Migdalia Dk at [facility] in [place] on August 2, for a second opinion and clinical trial options.  MEDICAL HISTORY:  Past Medical History  Diagnosis Date  . Medical history non-contributory   . gastric ca dx'd 04/2014    SURGICAL HISTORY: Past Surgical History  Procedure Laterality Date  . Squamous cell carcinoma excision  approx March 2015    removed from top of head  . Esophagogastroduodenoscopy (egd) with propofol N/A 04/25/2014    Procedure: ESOPHAGOGASTRODUODENOSCOPY (EGD) WITH PROPOFOL;  Surgeon: Milus Banister, MD;  Location: [facility];  Service: Endoscopy;  Laterality: N/A;    SOCIAL HISTORY: History   Social History  . Marital Status: Single    Spouse Name: N/A  . Number of Children: N/A  . Years of Education: N/A   Occupational History  . Not on file.   Social History Main Topics  . Smoking status: Never Smoker   . Smokeless tobacco: Never Used  . Alcohol Use: No  . Drug Use: No  . Sexual Activity: Not on file   Other Topics Concern  . Not on file   Social History Narrative    FAMILY HISTORY: Family History  Problem Relation Age of Onset  . Breast cancer Sister 73   . Hypertension Mother   . Hypertension Father   Maternal aunt breast cancer at age of 57 Maternal cousin ovarian cancer at age of 42 Maternal ancle prostate cancer age of 69   ALLERGIES:  has No Known Allergies.  MEDICATIONS:  Current  Outpatient Prescriptions  Medication Sig Dispense Refill  . docusate sodium 100 MG CAPS Take 100 mg by mouth 2 (two) times daily. 10 capsule 0  . lidocaine-prilocaine (EMLA) cream Apply 1 application topically as needed. 30 g 0  . mirtazapine (REMERON) 15 MG tablet Take 1 tablet (15 mg total) by mouth at bedtime. 30 tablet 1  . morphine (MS CONTIN) 15 MG 12 hr tablet Take 1 tablet (15 mg total) by mouth every 12 (twelve) hours. 60 tablet 0  . ondansetron (ZOFRAN) 4 MG tablet Take 1 tablet (4 mg total) by mouth every 8 (eight) hours as needed for nausea or vomiting. 20 tablet 0  . pantoprazole (PROTONIX) 40 MG tablet Take 1 tablet (40 mg total) by mouth daily. 30 tablet 0  . prochlorperazine (COMPAZINE) 10 MG tablet Take 1 tablet (10 mg total) by mouth every 6 (six) hours as needed for nausea or vomiting. 30 tablet 3  . traMADol (ULTRAM) 50 MG tablet Take 1-2 tablets (50-100 mg total) by mouth every 6 (six) hours as needed for moderate pain. 30 tablet 0   No current facility-administered medications for this visit.   Facility-Administered Medications Ordered in Other Visits  Medication Dose Route Frequency Provider Last Rate Last Dose  . fluorouracil (ADRUCIL) 5,700 mg in sodium chloride 0.9 % 136 mL chemo infusion  2,280 mg/m2 (Treatment Plan  Actual) Intravenous 1 day or 1 dose Truitt Merle, MD      . leucovorin 996 mg in dextrose 5 % 250 mL infusion  400 mg/m2 (Treatment Plan Actual) Intravenous Once Truitt Merle, MD      . ondansetron (ZOFRAN) 8 mg, dexamethasone (DECADRON) 10 mg in sodium chloride 0.9 % 50 mL IVPB   Intravenous Once Truitt Merle, MD      . oxaliplatin (ELOXATIN) 200 mg in dextrose 5 % 500 mL chemo infusion  80 mg/m2 (Treatment Plan Actual) Intravenous Once Truitt Merle, MD      . prochlorperazine (COMPAZINE) tablet 10 mg  10 mg Oral Q6H PRN Truitt Merle, MD   10 mg at 12/03/14 1102    REVIEW OF SYSTEMS:   Constitutional: Denies fevers, chills or abnormal night sweats, (+) weight loss and  anorexia Eyes: Denies blurriness of vision, double vision or watery eyes Ears, nose, mouth, throat, and face: Denies mucositis or sore throat Respiratory: Denies cough, dyspnea or wheezes Cardiovascular: Denies palpitation, chest discomfort or lower extremity swelling Gastrointestinal:  Less nausea, abdominal bloating and discomfort Skin: Denies abnormal skin rashes Lymphatics: Denies new lymphadenopathy or easy bruising Neurological:Denies numbness, tingling or new weaknesses Behavioral/Psych: Mood is stable, no new changes  All other systems were reviewed with the patient and are negative except those mentioned in the history.  PHYSICAL EXAMINATION: ECOG PERFORMANCE STATUS: 1  GENERAL:alert, no distress and comfortable SKIN: skin color, texture, turgor are normal, no rashes or significant lesions EYES: normal, conjunctiva are pink and non-injected, sclera clear OROPHARYNX:no exudate, no erythema and lips, buccal mucosa, and tongue normal  NECK: supple, thyroid normal size, non-tender, without nodularity LYMPH:  no palpable lymphadenopathy in the cervical, axillary or inguinal LUNGS: clear to auscultation and percussion with normal breathing effort HEART: regular rate & rhythm and no murmurs and no lower extremity edema ABDOMEN:abdomen soft, non-tender and normal bowel sounds, probable small amount of ascites. Musculoskeletal:no cyanosis of digits and no clubbing  PSYCH: alert & oriented x 3 with fluent speech NEURO: no focal motor/sensory deficits  LABORATORY DATA:  I have reviewed the data as listed CBC Latest Ref Rng 12/03/2014 11/19/2014 11/05/2014  WBC 4.0 - 10.3 10e3/uL 4.0 3.9(L) 4.3  Hemoglobin 13.0 - 17.1 g/dL 12.3(L) 12.7(L) 12.6(L)  Hematocrit 38.4 - 49.9 % 37.1(L) 38.0(L) 38.5  Platelets 140 - 400 10e3/uL 122(L) 118(L) 132(L)      CMP Latest Ref Rng 12/03/2014 11/19/2014 11/05/2014  Glucose 70 - 140 mg/dl 101 112 93  BUN 7.0 - 26.0 mg/dL 15.1 15.2 14.8  Creatinine 0.7  - 1.3 mg/dL 0.8 0.8 0.8  Sodium 136 - 145 mEq/L 139 141 141  Potassium 3.5 - 5.1 mEq/L 3.8 3.9 3.9  Chloride 96 - 112 mEq/L - - -  CO2 22 - 29 mEq/L 26 27 27   Calcium 8.4 - 10.4 mg/dL 8.5 8.9 8.7  Total Protein 6.4 - 8.3 g/dL 6.5 6.5 6.4  Total Bilirubin 0.20 - 1.20 mg/dL 0.43 0.46 0.54  Alkaline Phos 40 - 150 U/L 154(H) 162(H) 162(H)  AST 5 - 34 U/L 26 24 28   ALT 0 - 55 U/L 15 14 16     CEA (Order [id number])      CEA  Status: Finalresult Visible to patient:  MyChart Nextappt: 12/05/2014 at 11:30 AM in Oncology [facility] FLUSH NURSE 2) Dx:  Gastric adenocarcinoma: Stage IV           Ref Range 4wk ago  22moago  372mogo  CEA 0.0 - 5.0 ng/mL 1.7 1.8 1.7           Pathology report  Diagnosis  PERITONEAL/ASCITIC FLUID(SPECIMEN 1 OF 1 COLLECTED 04/26/14): METASTATIC ADENOCARCINOMA WITH SIGNET RING CELL FEATURES.  Stomach, biopsy, r/o cancer 04/25/14 - ADENOCARCINOMA WITH SIGNET RING FEATURES. Microscopic Comment The results were called to Dr. Ardis Hughs on 04/26/2014. (JDP:kh 04/26/14) Claudette Laws MD Pathologist, Electronic Signature (Case signed 04/26/2014)  HER-2/NEU BY CISH - NO AMPLIFICATION OF HER-2 DETECTED. RESULT RATIO OF HER2: CEP 17 SIGNALS 1.20 AVERAGE HER2 COPY NUMBER PER CELL 2.10  ADDITIONAL INFORMATION: Mismatch Repair (MMR) Protein Immunohistochemistry (IHC) IHC Expression Result: MLH1: Preserved nuclear expression (greater 50% tumor expression) MSH2: Preserved nuclear expression (greater 50% tumor expression) MSH6: Preserved nuclear expression (greater 50% tumor expression) PMS2: Preserved nuclear expression (greater 50% tumor expression) * Internal control demonstrates intact nuclear expression Interpretation: NORMAL There is preserved expression of the major and minor MMR proteins. There is a very low probability that microsatellite instability (MSI) is present. However, certain clinically significant MMR protein  mutations may result in preservation of nuclear expression. It is recommended that the preservation of protein expression be correlated with molecular based MSI testing.  [employer] testing: (+) ZO10 and NF1   RADIOGRAPHIC STUDIES: I have personally reviewed the radiological images as listed and agreed with the findings in the report.  Ct Chest, abdomen and pelvis Wo Contrast12/24/2015    IMPRESSION:  1. Findings, as above, highly concerning for either esophageal or gastric primary malignancy, with metastatic lymphadenopathy in the upper abdomen and retroperitoneum, widespread intraperitoneal spread of disease, malignant ascites, and multiple pulmonary nodules in the visualize lung bases concerning for pulmonary metastasis. Oncologic evaluation is recommended.  2. Additional incidental findings, as above. These results were called by telephone at the time of interpretation on 04/23/2014 at 7:53 pm to [place] , who verbally acknowledged these results.   Electronically Signed   By: Vinnie Langton M.D.   On: 04/23/2014 19:56   CT CAP 06/24/2014 IMPRESSION: 1. Progressive low density and calcifications within the extensive upper abdominal lymphadenopathy, likely related to partial treatment. Some of the lymph nodes do appear slightly larger. The irregular infiltrating mass involving the gastric cardia appears slightly improved. 2. No evidence of hepatic metastatic disease. 3. Overall volume of ascites has improved, although extensive changes of peritoneal carcinomatosis have not significantly changed. 4. No significant change in bilateral pulmonary nodules consistent with metastatic disease. 5. Resolution of bilateral pleural effusions.  CT chest, abdomen and pelvis with contrast on 08/26/2014 IMPRESSION: Overall stable to slight progression of disease. Existing pulmonary nodules are stable to slightly progressed with no new pulmonary nodules evident. The bulky  upper abdominal lymphadenopathy is stable to minimally progressed while no substantial change in the primary infiltrating gastric neoplasm is discernible. Omental disease appears stable. There is slightly more ascites on today's study.  CT chest, abdomen and pelvis with contrast on 10/21/2014 IMPRESSION: 1. Compared to the prior examination there appears to be slight progression of disease predominantly manifest by worsening gastric mucosal thickening, particularly along the greater curvature of the stomach in the region of the cardia and fundus, with increasing adjacent mesenteric/omental disease and slight increase in malignant ascites. Previously described upper abdominal lymphadenopathy is generally stable to slightly decreased compared to the prior examination, and previously noted pulmonary nodules are unchanged. 2. Additional incidental findings, as above.   EGD WITH BIOPSY 04/25/2014 ENDOSCOPIC IMPRESSION: There was a large, nearly circumferential, ulcerated, clearly malignant mass that  stradles the GE junction. The vast bulk of the mass lays in the stomach, occupying about 1/3 of the stomach. The mass extends into the esophagus for about 4cm above the GE junction. Mutiple biopsies were taken from the gastric portion of the mass   I ASSESSMENT & PLAN:  39 year old gentleman without significant past medical history presents with abdominal bloating, nausea, vomiting, diarrhea, anemia and weight loss. EGD showed a large mass arising from the gastric fundus and extending into GE junction, biopsy showed adenocarcinoma with signet ring features, CT scan is consistent with peritoneal carcinomatosis and bilateral lung metastasis. Ascites cytology was positive for malignant cells.  1. 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 HER-2 negative, MMR-normal -I discussed his surgical biopsy results, cytology results, skin findings with  patient and his family members, including his parents and 2 sisters. Unfortunately, this is widely metastatic, not curable, and overall prognosis is very poor. Literature has showed gastric adenocarcinoma with signet ring features has overall worse outcome comparing to others. His life expectancy is probably less than 6 months without treatment. -He has started first-line chemotherapy FOLFOX, tolerated well, and his symptoms have much  improved. -His tumor is negative for HER-2 overexpression, no role for trastuzumab. -We also checked his tumor for MMR, which was normal so he would unlikely benefit from immunotherapy.,  -[facility] genomic test reviewed no actionable mutation -His first restaging CT scan showed mild partial response, overall stable disease. His ascites has significantly decreased. Pleural effusion resolved. He is clinically doing well.  -I discussed his third restaging CT findings with patient and his father. It seems he had a mixed response, slight increase of the gastric wall thickness and the ascites, abdominal lymph nodes are smaller, overall I consider as stable disease or slight disease progression.  -His tumor marker CEA has came down to normal. He is clinically doing well, I recommended continuing FOLFOX. However, if his ascites returns significantly, I'll change him to second line FOLFIRI.  -he was interested in pursuing [place] in [place], which is a intravenous infusion for total of 2 weeks. Giving the slight disease progression on the scan, i.e. recommends to hold it for now.  -due to his family history of malignancy, he has had genetic testing at [facility]. It was negative for the comprehensive cancer panel, which included APC, BRCA 1/2, CDE H1, MLH1, MSH2, MSH6, PMS2, etc. -Lab are reviewed, mild thrombocytopenia, adequate for treatment, we'll proceed cycle 16 chemotherapy today  2. Malignant ascites -resolved now after chemo  -Slightly increased on  recent CT CT scan, we'll monitor closely.  3. Anorexia, nausea, malnutrition -Nearly resolved, he feels much better lately -His albumin level is still low, the nutrition secondary to malignancy. -I encouraged him to take protein powder and diet supplements as needed.  4. Anemia, multifactorial (iron deficient anemia, cancer related and chemotherapy induced ) -His ferritin level was normal at 171, but he has low iron and saturation, likely some degree of iron deficient anemia. -He has received IV Feraheme 510 mg twice, 2 weeks apart -Blood transfusion as needed during chemotherapy if her hemoglobin below 8. -Mild anemia has been stable.   Plan #1 cycle 15 FOLFOX today,  No 5-FU bolus, 5-fu 5% dose reduction, no Neulasta on day 3 with this cycle  #2 RTC in 2 weeks for cycle 17   The patient knows to call the clinic with any problems, questions or concerns.  I spent 20 minutes counseling the  patient face to face. The total time spent in the appointment was 25  minutes and more than 50% was on counseling.     Truitt Merle, MD 12/03/2014

## 2014-12-03 NOTE — Patient Instructions (Signed)
[place] Discharge Instructions for Patients Receiving Chemotherapy  Today you received the following chemotherapy agents: Oxaliplatin, Leucovorin, 5FU pump  To help prevent nausea and vomiting after your treatment, we encourage you to take your nausea medication as prescribed by your physician.   If you develop nausea and vomiting that is not controlled by your nausea medication, call the clinic.   BELOW ARE SYMPTOMS THAT SHOULD BE REPORTED IMMEDIATELY:  *FEVER GREATER THAN 100.5 F  *CHILLS WITH OR WITHOUT FEVER  NAUSEA AND VOMITING THAT IS NOT CONTROLLED WITH YOUR NAUSEA MEDICATION  *UNUSUAL SHORTNESS OF BREATH  *UNUSUAL BRUISING OR BLEEDING  TENDERNESS IN MOUTH AND THROAT WITH OR WITHOUT PRESENCE OF ULCERS  *URINARY PROBLEMS  *BOWEL PROBLEMS  UNUSUAL RASH Items with * indicate a potential emergency and should be followed up as soon as possible.  Feel free to call the clinic you have any questions or concerns. The clinic phone number is [phone].  Please show the [place] at check-in to the Emergency Department and triage nurse.

## 2014-12-05 ENCOUNTER — Ambulatory Visit (HOSPITAL_BASED_OUTPATIENT_CLINIC_OR_DEPARTMENT_OTHER): Payer: BLUE CROSS/BLUE SHIELD

## 2014-12-05 VITALS — BP 129/72 | HR 65 | Temp 98.6°F | Resp 18

## 2014-12-05 DIAGNOSIS — C786 Secondary malignant neoplasm of retroperitoneum and peritoneum: Secondary | ICD-10-CM

## 2014-12-05 DIAGNOSIS — C169 Malignant neoplasm of stomach, unspecified: Secondary | ICD-10-CM | POA: Diagnosis not present

## 2014-12-05 DIAGNOSIS — C7802 Secondary malignant neoplasm of left lung: Secondary | ICD-10-CM

## 2014-12-05 DIAGNOSIS — C7801 Secondary malignant neoplasm of right lung: Secondary | ICD-10-CM

## 2014-12-05 MED ORDER — SODIUM CHLORIDE 0.9 % IJ SOLN
10.0000 mL | INTRAMUSCULAR | Status: DC | PRN
  Administered 2014-12-05: 10 mL
  Filled 2014-12-05: qty 10

## 2014-12-05 MED ORDER — HEPARIN SOD (PORK) LOCK FLUSH 100 UNIT/ML IV SOLN
500.0000 [IU] | Freq: Once | INTRAVENOUS | Status: AC | PRN
  Administered 2014-12-05: 500 [IU]
  Filled 2014-12-05: qty 5

## 2014-12-17 ENCOUNTER — Ambulatory Visit: Payer: BLUE CROSS/BLUE SHIELD

## 2014-12-17 ENCOUNTER — Ambulatory Visit (HOSPITAL_BASED_OUTPATIENT_CLINIC_OR_DEPARTMENT_OTHER): Payer: BLUE CROSS/BLUE SHIELD

## 2014-12-17 ENCOUNTER — Ambulatory Visit (HOSPITAL_BASED_OUTPATIENT_CLINIC_OR_DEPARTMENT_OTHER): Payer: BLUE CROSS/BLUE SHIELD | Admitting: Hematology

## 2014-12-17 ENCOUNTER — Other Ambulatory Visit (HOSPITAL_BASED_OUTPATIENT_CLINIC_OR_DEPARTMENT_OTHER): Payer: BLUE CROSS/BLUE SHIELD

## 2014-12-17 ENCOUNTER — Telehealth: Payer: Self-pay | Admitting: Hematology

## 2014-12-17 ENCOUNTER — Telehealth: Payer: Self-pay | Admitting: *Deleted

## 2014-12-17 ENCOUNTER — Encounter: Payer: Self-pay | Admitting: Hematology

## 2014-12-17 VITALS — BP 147/76 | HR 77 | Temp 97.8°F | Resp 18 | Ht 69.0 in | Wt 284.1 lb

## 2014-12-17 DIAGNOSIS — C786 Secondary malignant neoplasm of retroperitoneum and peritoneum: Secondary | ICD-10-CM

## 2014-12-17 DIAGNOSIS — R110 Nausea: Secondary | ICD-10-CM

## 2014-12-17 DIAGNOSIS — C169 Malignant neoplasm of stomach, unspecified: Secondary | ICD-10-CM

## 2014-12-17 DIAGNOSIS — C7801 Secondary malignant neoplasm of right lung: Secondary | ICD-10-CM | POA: Diagnosis not present

## 2014-12-17 DIAGNOSIS — Z95828 Presence of other vascular implants and grafts: Secondary | ICD-10-CM

## 2014-12-17 DIAGNOSIS — C7802 Secondary malignant neoplasm of left lung: Secondary | ICD-10-CM

## 2014-12-17 DIAGNOSIS — D509 Iron deficiency anemia, unspecified: Secondary | ICD-10-CM

## 2014-12-17 DIAGNOSIS — D6481 Anemia due to antineoplastic chemotherapy: Secondary | ICD-10-CM

## 2014-12-17 DIAGNOSIS — R630 Anorexia: Secondary | ICD-10-CM

## 2014-12-17 DIAGNOSIS — Z5111 Encounter for antineoplastic chemotherapy: Secondary | ICD-10-CM

## 2014-12-17 DIAGNOSIS — R180 Malignant ascites: Secondary | ICD-10-CM

## 2014-12-17 DIAGNOSIS — E46 Unspecified protein-calorie malnutrition: Secondary | ICD-10-CM

## 2014-12-17 DIAGNOSIS — D630 Anemia in neoplastic disease: Secondary | ICD-10-CM

## 2014-12-17 LAB — CBC WITH DIFFERENTIAL/PLATELET
BASO%: 1.1 % (ref 0.0–2.0)
BASOS ABS: 0 10*3/uL (ref 0.0–0.1)
EOS%: 1.5 % (ref 0.0–7.0)
Eosinophils Absolute: 0.1 10*3/uL (ref 0.0–0.5)
HCT: 38 % — ABNORMAL LOW (ref 38.4–49.9)
HGB: 12.6 g/dL — ABNORMAL LOW (ref 13.0–17.1)
LYMPH#: 1.1 10*3/uL (ref 0.9–3.3)
LYMPH%: 28 % (ref 14.0–49.0)
MCH: 29.2 pg (ref 27.2–33.4)
MCHC: 33.2 g/dL (ref 32.0–36.0)
MCV: 87.9 fL (ref 79.3–98.0)
MONO#: 0.9 10*3/uL (ref 0.1–0.9)
MONO%: 21 % — ABNORMAL HIGH (ref 0.0–14.0)
NEUT#: 2 10*3/uL (ref 1.5–6.5)
NEUT%: 48.4 % (ref 39.0–75.0)
Platelets: 140 10*3/uL (ref 140–400)
RBC: 4.32 10*6/uL (ref 4.20–5.82)
RDW: 17.3 % — AB (ref 11.0–14.6)
WBC: 4.1 10*3/uL (ref 4.0–10.3)

## 2014-12-17 LAB — COMPREHENSIVE METABOLIC PANEL (CC13)
ALK PHOS: 136 U/L (ref 40–150)
ALT: 15 U/L (ref 0–55)
ANION GAP: 6 meq/L (ref 3–11)
AST: 25 U/L (ref 5–34)
Albumin: 2.9 g/dL — ABNORMAL LOW (ref 3.5–5.0)
BUN: 15.7 mg/dL (ref 7.0–26.0)
CALCIUM: 8.7 mg/dL (ref 8.4–10.4)
CO2: 27 meq/L (ref 22–29)
Chloride: 108 mEq/L (ref 98–109)
Creatinine: 0.8 mg/dL (ref 0.7–1.3)
Glucose: 96 mg/dl (ref 70–140)
POTASSIUM: 4.4 meq/L (ref 3.5–5.1)
Sodium: 140 mEq/L (ref 136–145)
TOTAL PROTEIN: 6.7 g/dL (ref 6.4–8.3)
Total Bilirubin: 0.64 mg/dL (ref 0.20–1.20)

## 2014-12-17 LAB — CEA: CEA: 1 ng/mL (ref 0.0–5.0)

## 2014-12-17 MED ORDER — SODIUM CHLORIDE 0.9 % IV SOLN
2280.0000 mg/m2 | INTRAVENOUS | Status: DC
  Administered 2014-12-17: 5700 mg via INTRAVENOUS
  Filled 2014-12-17: qty 114

## 2014-12-17 MED ORDER — SODIUM CHLORIDE 0.9 % IJ SOLN
10.0000 mL | INTRAMUSCULAR | Status: DC | PRN
  Filled 2014-12-17: qty 10

## 2014-12-17 MED ORDER — SODIUM CHLORIDE 0.9 % IV SOLN
Freq: Once | INTRAVENOUS | Status: AC
  Administered 2014-12-17: 10:00:00 via INTRAVENOUS
  Filled 2014-12-17: qty 4

## 2014-12-17 MED ORDER — PROCHLORPERAZINE MALEATE 10 MG PO TABS
10.0000 mg | ORAL_TABLET | Freq: Once | ORAL | Status: AC
  Administered 2014-12-17: 10 mg via ORAL

## 2014-12-17 MED ORDER — LEUCOVORIN CALCIUM INJECTION 350 MG
400.0000 mg/m2 | Freq: Once | INTRAVENOUS | Status: AC
  Administered 2014-12-17: 996 mg via INTRAVENOUS
  Filled 2014-12-17: qty 49.8

## 2014-12-17 MED ORDER — DEXTROSE 5 % IV SOLN
Freq: Once | INTRAVENOUS | Status: AC
  Administered 2014-12-17: 10:00:00 via INTRAVENOUS

## 2014-12-17 MED ORDER — OXALIPLATIN CHEMO INJECTION 100 MG/20ML
80.0000 mg/m2 | Freq: Once | INTRAVENOUS | Status: AC
  Administered 2014-12-17: 200 mg via INTRAVENOUS
  Filled 2014-12-17: qty 40

## 2014-12-17 MED ORDER — HEPARIN SOD (PORK) LOCK FLUSH 100 UNIT/ML IV SOLN
500.0000 [IU] | Freq: Once | INTRAVENOUS | Status: DC | PRN
  Filled 2014-12-17: qty 5

## 2014-12-17 MED ORDER — SODIUM CHLORIDE 0.9 % IJ SOLN
10.0000 mL | INTRAMUSCULAR | Status: DC | PRN
  Administered 2014-12-17: 10 mL via INTRAVENOUS
  Filled 2014-12-17: qty 10

## 2014-12-17 MED ORDER — PROCHLORPERAZINE MALEATE 10 MG PO TABS
ORAL_TABLET | ORAL | Status: AC
  Filled 2014-12-17: qty 1

## 2014-12-17 NOTE — Patient Instructions (Signed)

## 2014-12-17 NOTE — Patient Instructions (Signed)
[facility] Discharge Instructions for Patients Receiving Chemotherapy  Today you received the following chemotherapy agents 5 FU/Leucovorin/Oxaliplatin To help prevent nausea and vomiting after your treatment, we encourage you to take your nausea medication as prescribed.   If you develop nausea and vomiting that is not controlled by your nausea medication, call the clinic.   BELOW ARE SYMPTOMS THAT SHOULD BE REPORTED IMMEDIATELY:  *FEVER GREATER THAN 100.5 F  *CHILLS WITH OR WITHOUT FEVER  NAUSEA AND VOMITING THAT IS NOT CONTROLLED WITH YOUR NAUSEA MEDICATION  *UNUSUAL SHORTNESS OF BREATH  *UNUSUAL BRUISING OR BLEEDING  TENDERNESS IN MOUTH AND THROAT WITH OR WITHOUT PRESENCE OF ULCERS  *URINARY PROBLEMS  *BOWEL PROBLEMS  UNUSUAL RASH Items with * indicate a potential emergency and should be followed up as soon as possible.  Feel free to call the clinic you have any questions or concerns. The clinic phone number is [phone].  Please show the CHEMO ALERT CARD at check-in to the Emergency Department and triage nurse.   

## 2014-12-17 NOTE — Progress Notes (Signed)
Samuel Durham  [phone] Fax:([phone]  Clinic New Consult Note   Patient Care Team: No Pcp Per Patient as PCP - General (General Practice) 12/17/2014  CHIEF COMPLAINTS Follow up metastatic gastric cancer    Gastric adenocarcinoma: Stage IV   04/23/2014 Imaging CT abdomen showed peritoneal carcinomatosis, ascites, thickning of gastric and GEJ wall, CT chest showed a few small b/l lung nodules.    04/24/2014 Tumor Marker CEA  5.6, CA19.9 13   04/25/2014 Initial Diagnosis Gastric adenocarcinoma: Stage IV   04/25/2014 Pathology Results Stomach mass biopsy showed - ADENOCARCINOMA WITH SIGNET RING FEATURES. ascites cytology (+)   04/25/2014 Procedure EGD: There was a large, nearly circumferential, ulcerated, clearly malignant mass that stradles the GE junction. The vast bulk of the mass lays in the stomach.    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 I saw him first when [facility] was [facility] He is here for the first office visit after hospital discharge.  He was  admitted on 12/15 with progressive, 4 month history of dysphagia, epigastric abdominal discomfort and bloating, early satiation accompanied by nausea, and postprandial vomiting and diarrhea. He has experienced a 30 lbs weight loss in the process. He has noted increased abdominal girth. Patient denies any shortness of breath, chest pain, GI bleed, hematuria or lower extremity swelling. He denies heavy alcohol intake. Denies tobacco abuse. Denies risk  factors for HIV or hepatitis. He denies any family history of GI malignancies.  CT of the abdomen and pelvis with contrast on 12/15 revealed several pulmonary nodules at the bases bilaterally, profound thickening of the distal esophagus extending beyond the gastroesophageal junction into the proximal stomach,cardia and fundus of the stomach, most evident along the lesser curvature. No pathologic dilatation of small bowel or colon. Extensive upper abdominal and retroperitoneal lymphadenopathy was visualized. Moderate volume of ascites, presumably malignant, was noted. Extensive soft tissue nodularity throughout the omentum, compatible with omental caking and widespread peritoneal metastasis. Additionally, there are other areas of enhancing nodularity along the peritoneal surface. Tiny ventral and umbilical hernias containing predominantly omental fat, including some omental implants. There are no aggressive appearing lytic or blastic lesions noted in the visualized portions of the skeleton.  He underwent an EGD by Dr. JArdis Hughson 04/25/2014, which showed a large fungating mass that straddles the GE junction and occupying the upper 1/3 part of the stomach. Multiple biopsy was taken which showed adenocarcinoma with signet ring features. He also underwent a paracentesis with 4.5 L fluid is removed on 04/26/2014. Cytology was positive for adenocarcinoma with signet ring features, consistent with metastatic disease.  He was discharged to home on 04/27/2014. He did feel a slightly informant of his abdominal bloating after the paracentesis, but not much improvement other symptoms. He had Mediport placement and the second paracentesis was again 4 L fluids removal on 05/02/2014. His appetite is low, he drinks ensure 3 bottles a day, some soup and a very little other foods. His energy level is also low, but able to take care of  all his daily needs, and move around with about much difficulty. His abdominal  discomfort/pain are relatively controlled by morphine 15 mg at night and tramadol 2-3 times during the day. He denies any fever, chills, cough, chest pain or any in the other discomfort.  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 He visited [facility] last week and saw Dr. Migdalia Dk [phone] #2). He is doing well overall, no new complains. He has good appetite and eating well. No significant pain, abdominal discomfort, bloating, diarrhea or constipation. He has good energy level, has been tolerating treatment very well. No fever or chills.  MEDICAL HISTORY:  Past Medical History  Diagnosis Date  . Medical history non-contributory   . gastric ca dx'd 04/2014    SURGICAL HISTORY: Past Surgical History  Procedure Laterality Date  . Squamous cell carcinoma excision  approx March 2015    removed from top of head  . Esophagogastroduodenoscopy (egd) with propofol N/A 04/25/2014    Procedure: ESOPHAGOGASTRODUODENOSCOPY (EGD) WITH PROPOFOL;  Surgeon: Milus Banister, MD;  Location: [facility];  Service: Endoscopy;  Laterality: N/A;    SOCIAL HISTORY: History   Social History  . Marital Status: Single    Spouse Name: N/A  . Number of Children: N/A  . Years of Education: N/A   Occupational History  . Not on file.   Social History Main Topics  . Smoking status: Never Smoker   . Smokeless tobacco: Never Used  . Alcohol Use: No  . Drug Use: No  . Sexual Activity: Not on file   Other Topics Concern  . Not on file   Social History Narrative    FAMILY HISTORY: Family History  Problem Relation Age of Onset  . Breast cancer Sister 36   . Hypertension Mother   . Hypertension Father   Maternal aunt breast cancer at age of 34 Maternal cousin ovarian cancer at age of 46 Maternal ancle prostate cancer age of 28   ALLERGIES:  has No Known  Allergies.  MEDICATIONS:  Current Outpatient Prescriptions  Medication Sig Dispense Refill  . docusate sodium 100 MG CAPS Take 100 mg by mouth 2 (two) times daily. 10 capsule 0  . lidocaine-prilocaine (EMLA) cream Apply 1 application topically as needed. 30 g 0  . mirtazapine (REMERON) 15 MG tablet Take 1 tablet (15 mg total) by mouth at bedtime. 30 tablet 1  . morphine (MS CONTIN) 15 MG 12 hr tablet Take 1 tablet (15 mg total) by mouth every 12 (twelve) hours. 60 tablet 0  . ondansetron (ZOFRAN) 4 MG tablet Take 1 tablet (4 mg total) by mouth every 8 (eight) hours as needed for nausea or vomiting. 20 tablet 0  . pantoprazole (PROTONIX) 40 MG tablet Take 1 tablet (40 mg total) by mouth daily. 30 tablet 0  . prochlorperazine (COMPAZINE) 10 MG tablet Take 1 tablet (10 mg total) by mouth every 6 (six) hours as needed for nausea or vomiting. 30 tablet 3  . traMADol (ULTRAM) 50 MG tablet Take 1-2 tablets (50-100 mg total) by mouth every 6 (six) hours as needed for moderate pain. 30 tablet 0   No current facility-administered medications for this visit.   Facility-Administered Medications Ordered in Other Visits  Medication Dose Route Frequency Provider Last Rate Last Dose  . sodium chloride 0.9 % injection 10 mL  10 mL Intravenous PRN Truitt Merle, MD  10 mL at 12/17/14 0831    REVIEW OF SYSTEMS:   Constitutional: Denies fevers, chills or abnormal night sweats, no recent weight loss Eyes: Denies blurriness of vision, double vision or watery eyes Ears, nose, mouth, throat, and face: Denies mucositis or sore throat Respiratory: Denies cough, dyspnea or wheezes Cardiovascular: Denies palpitation, chest discomfort or lower extremity swelling Gastrointestinal:  Less nausea, abdominal bloating and discomfort Skin: Denies abnormal skin rashes Lymphatics: Denies new lymphadenopathy or easy bruising Neurological:Denies numbness, tingling or new weaknesses Behavioral/Psych: Mood is stable, no new  changes  All other systems were reviewed with the patient and are negative except those mentioned in the history.  PHYSICAL EXAMINATION: ECOG PERFORMANCE STATUS: 1 GENERAL:alert, no distress and comfortable SKIN: skin color, texture, turgor are normal, no rashes or significant lesions EYES: normal, conjunctiva are pink and non-injected, sclera clear OROPHARYNX:no exudate, no erythema and lips, buccal mucosa, and tongue normal  NECK: supple, thyroid normal size, non-tender, without nodularity LYMPH:  no palpable lymphadenopathy in the cervical, axillary or inguinal LUNGS: clear to auscultation and percussion with normal breathing effort HEART: regular rate & rhythm and no murmurs and no lower extremity edema ABDOMEN:abdomen soft, non-tender and normal bowel sounds, probable small amount of ascites. Musculoskeletal:no cyanosis of digits and no clubbing  PSYCH: alert & oriented x 3 with fluent speech NEURO: no focal motor/sensory deficits  LABORATORY DATA:  I have reviewed the data as listed CBC Latest Ref Rng 12/17/2014 12/03/2014 11/19/2014  WBC 4.0 - 10.3 10e3/uL 4.1 4.0 3.9(L)  Hemoglobin 13.0 - 17.1 g/dL 12.6(L) 12.3(L) 12.7(L)  Hematocrit 38.4 - 49.9 % 38.0(L) 37.1(L) 38.0(L)  Platelets 140 - 400 10e3/uL 140 122(L) 118(L)      CMP Latest Ref Rng 12/03/2014 11/19/2014 11/05/2014  Glucose 70 - 140 mg/dl 101 112 93  BUN 7.0 - 26.0 mg/dL 15.1 15.2 14.8  Creatinine 0.7 - 1.3 mg/dL 0.8 0.8 0.8  Sodium 136 - 145 mEq/L 139 141 141  Potassium 3.5 - 5.1 mEq/L 3.8 3.9 3.9  Chloride 96 - 112 mEq/L - - -  CO2 22 - 29 mEq/L 26 27 27   Calcium 8.4 - 10.4 mg/dL 8.5 8.9 8.7  Total Protein 6.4 - 8.3 g/dL 6.5 6.5 6.4  Total Bilirubin 0.20 - 1.20 mg/dL 0.43 0.46 0.54  Alkaline Phos 40 - 150 U/L 154(H) 162(H) 162(H)  AST 5 - 34 U/L 26 24 28   ALT 0 - 55 U/L 15 14 16     CEA (Order [id number])      CEA  Status: Finalresult Visible to patient:  MyChart Nextappt: Today at 10:15 AM in  [facility] E17) Dx:  Gastric adenocarcinoma: Stage IV           Ref Range 2wk ago  76moago  254mogo     CEA 0.0 - 5.0 ng/mL 1.8 1.7 1.8           Pathology report  Diagnosis  PERITONEAL/ASCITIC FLUID(SPECIMEN 1 OF 1 COLLECTED 04/26/14): METASTATIC ADENOCARCINOMA WITH SIGNET RING CELL FEATURES.  Stomach, biopsy, r/o cancer 04/25/14 - ADENOCARCINOMA WITH SIGNET RING FEATURES. Microscopic Comment The results were called to Dr. JaArdis Hughsn 04/26/2014. (JDP:kh 04/26/14) JOClaudette LawsD Pathologist, Electronic Signature (Case signed 04/26/2014)  HER-2/NEU BY CISH - NO AMPLIFICATION OF HER-2 DETECTED. RESULT RATIO OF HER2: CEP 17 SIGNALS 1.20 AVERAGE HER2 COPY NUMBER PER CELL 2.10  ADDITIONAL INFORMATION: Mismatch Repair (MMR) Protein Immunohistochemistry (IHC) IHC Expression Result: MLH1: Preserved nuclear expression (greater 50% tumor expression) MSH2: Preserved nuclear expression (  greater 50% tumor expression) MSH6: Preserved nuclear expression (greater 50% tumor expression) PMS2: Preserved nuclear expression (greater 50% tumor expression) * Internal control demonstrates intact nuclear expression Interpretation: NORMAL There is preserved expression of the major and minor MMR proteins. There is a very low probability that microsatellite instability (MSI) is present. However, certain clinically significant MMR protein mutations may result in preservation of nuclear expression. It is recommended that the preservation of protein expression be correlated with molecular based MSI testing.  [employer] testing: (+) ZH29 and NF1   RADIOGRAPHIC STUDIES: I have personally reviewed the radiological images as listed and agreed with the findings in the report.  Ct Chest, abdomen and pelvis Wo Contrast12/24/2015    IMPRESSION:  1. Findings, as above, highly concerning for either esophageal or gastric primary malignancy, with metastatic  lymphadenopathy in the upper abdomen and retroperitoneum, widespread intraperitoneal spread of disease, malignant ascites, and multiple pulmonary nodules in the visualize lung bases concerning for pulmonary metastasis. Oncologic evaluation is recommended.  2. Additional incidental findings, as above. These results were called by telephone at the time of interpretation on 04/23/2014 at 7:53 pm to Mercer , who verbally acknowledged these results.   Electronically Signed   By: Vinnie Langton M.D.   On: 04/23/2014 19:56   CT CAP 06/24/2014 IMPRESSION: 1. Progressive low density and calcifications within the extensive upper abdominal lymphadenopathy, likely related to partial treatment. Some of the lymph nodes do appear slightly larger. The irregular infiltrating mass involving the gastric cardia appears slightly improved. 2. No evidence of hepatic metastatic disease. 3. Overall volume of ascites has improved, although extensive changes of peritoneal carcinomatosis have not significantly changed. 4. No significant change in bilateral pulmonary nodules consistent with metastatic disease. 5. Resolution of bilateral pleural effusions.  CT chest, abdomen and pelvis with contrast on 08/26/2014 IMPRESSION: Overall stable to slight progression of disease. Existing pulmonary nodules are stable to slightly progressed with no new pulmonary nodules evident. The bulky upper abdominal lymphadenopathy is stable to minimally progressed while no substantial change in the primary infiltrating gastric neoplasm is discernible. Omental disease appears stable. There is slightly more ascites on today's study.  CT chest, abdomen and pelvis with contrast on 10/21/2014 IMPRESSION: 1. Compared to the prior examination there appears to be slight progression of disease predominantly manifest by worsening gastric mucosal thickening, particularly along the greater curvature of the stomach in the region of  the cardia and fundus, with increasing adjacent mesenteric/omental disease and slight increase in malignant ascites. Previously described upper abdominal lymphadenopathy is generally stable to slightly decreased compared to the prior examination, and previously noted pulmonary nodules are unchanged. 2. Additional incidental findings, as above.  CT chest, abdomen and pelvis with contrast, 12/11/2014 at [facility] Impression: #1 not much interval change into gastroesophageal junction mass next #2 number change in the lymph node, lung and peritoneal metastatic disease #3 small amount of intraperitoneal flus is slightly increased.   EGD WITH BIOPSY 04/25/2014 ENDOSCOPIC IMPRESSION: There was a large, nearly circumferential, ulcerated, clearly malignant mass that stradles the GE junction. The vast bulk of the mass lays in the stomach, occupying about 1/3 of the stomach. The mass extends into the esophagus for about 4cm above the GE junction. Mutiple biopsies were taken from the gastric portion of the mass   I ASSESSMENT & PLAN:  39 year old gentleman without significant past medical history presents with abdominal bloating, nausea, vomiting, diarrhea, anemia and weight loss. EGD showed a large mass arising from  the gastric fundus and extending into GE junction, biopsy showed adenocarcinoma with signet ring features, CT scan is consistent with peritoneal carcinomatosis and bilateral lung metastasis. Ascites cytology was positive for malignant cells.  1. 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 HER-2 negative, MMR-normal -I discussed his surgical biopsy results, cytology results, skin findings with patient and his family members, including his parents and 2 sisters. Unfortunately, this is widely metastatic, not curable, and overall prognosis is very poor. Literature has showed gastric adenocarcinoma with signet ring features has overall  worse outcome comparing to others. His life expectancy is probably less than 6 months without treatment. -He has started first-line chemotherapy FOLFOX, tolerated well, and his symptoms have much  improved. -His tumor is negative for HER-2 overexpression, no role for trastuzumab. -We also checked his tumor for MMR, which was normal so he would unlikely benefit from immunotherapy.,  -[facility] genomic test reviewed no actionable mutation -His first restaging CT scan showed mild partial response, overall stable disease. His ascites has significantly decreased. Pleural effusion resolved. He is clinically doing well.  -I discussed his third restaging CT findings with patient and his father. It seems he had a mixed response, slight increase of the gastric wall thickness and the ascites, abdominal lymph nodes are smaller, overall I consider as stable disease or slight disease progression.  -His tumor marker CEA has came down to normal. He is clinically doing well, I recommended continuing FOLFOX. However, if his ascites returns significantly, I'll change him to second line FOLFIRI.  -he was interested in pursuing [place] in [place], which is a intravenous infusion for total of 2 weeks. Giving the slight disease progression on the scan, i.e. recommends to hold it for now.  -due to his family history of malignancy, he has had genetic testing at [facility]. It was negative for the comprehensive cancer panel, which included APC, BRCA 1/2, CDE H1, MLH1, MSH2, MSH6, PMS2, etc. -He was seen at [facility] last week, clinical trial options were exported, he underwent further genetic testing, and the treatment recommendation was to continue current chemotherapy giving the stable disease. -Lab are reviewed, mild stable anemia, adequate for treatment, we'll proceed cycle 17 chemotherapy today  2. Malignant ascites -s/p paracentesis at the beginning of his diagnosis -Slightly increased on recent  CT CT scan, we'll monitor closely.  3. Anorexia, nausea, malnutrition -Nearly resolved, he feels much better lately -His albumin level is still low, the nutrition secondary to malignancy. -I encouraged him to take protein powder and diet supplements as needed.  4. Anemia, multifactorial (iron deficient anemia, cancer related and chemotherapy induced ) -His ferritin level was normal at 171, but he has low iron and saturation, likely some degree of iron deficient anemia. -He has received IV Feraheme 510 mg twice, 2 weeks apart -Blood transfusion as needed during chemotherapy if her hemoglobin below 8. -Mild anemia has been stable.   Plan #1 cycle 17 FOLFOX today,  No 5-FU bolus, 5-fu 5% dose reduction, no Neulasta on day 3 with this cycle  #2 RTC in 2 weeks for cycle 18   The patient knows to call the clinic with any problems, questions or concerns.  I spent 20 minutes counseling the patient face to face. The total time spent in the appointment was 25  minutes and more than 50% was on counseling.     Truitt Merle, MD 12/17/2014

## 2014-12-17 NOTE — Telephone Encounter (Signed)
per pof to schpt appt-sent MW emailt ot sch trmt-pt to get updated sch b4 leaving trmt

## 2014-12-17 NOTE — Telephone Encounter (Signed)
Per staff message and POF I have scheduled appts. Advised scheduler of appts. JMW  

## 2014-12-19 ENCOUNTER — Inpatient Hospital Stay
Admission: RE | Admit: 2014-12-19 | Discharge: 2014-12-19 | Disposition: A | Discharge status: Home / Self Care | Payer: Self-pay | Source: Ambulatory Visit | Attending: Hematology | Admitting: Hematology

## 2014-12-19 ENCOUNTER — Ambulatory Visit (HOSPITAL_BASED_OUTPATIENT_CLINIC_OR_DEPARTMENT_OTHER): Payer: BLUE CROSS/BLUE SHIELD

## 2014-12-19 ENCOUNTER — Other Ambulatory Visit: Payer: Self-pay | Admitting: Hematology

## 2014-12-19 VITALS — BP 114/77 | HR 67 | Temp 98.1°F | Resp 18

## 2014-12-19 DIAGNOSIS — C7801 Secondary malignant neoplasm of right lung: Secondary | ICD-10-CM | POA: Diagnosis not present

## 2014-12-19 DIAGNOSIS — Z452 Encounter for adjustment and management of vascular access device: Secondary | ICD-10-CM | POA: Diagnosis not present

## 2014-12-19 DIAGNOSIS — C169 Malignant neoplasm of stomach, unspecified: Secondary | ICD-10-CM | POA: Diagnosis not present

## 2014-12-19 DIAGNOSIS — C7802 Secondary malignant neoplasm of left lung: Secondary | ICD-10-CM

## 2014-12-19 DIAGNOSIS — C786 Secondary malignant neoplasm of retroperitoneum and peritoneum: Secondary | ICD-10-CM

## 2014-12-19 DIAGNOSIS — C801 Malignant (primary) neoplasm, unspecified: Secondary | ICD-10-CM

## 2014-12-19 MED ORDER — HEPARIN SOD (PORK) LOCK FLUSH 100 UNIT/ML IV SOLN
500.0000 [IU] | Freq: Once | INTRAVENOUS | Status: AC | PRN
  Administered 2014-12-19: 500 [IU]
  Filled 2014-12-19: qty 5

## 2014-12-19 MED ORDER — SODIUM CHLORIDE 0.9 % IJ SOLN
10.0000 mL | INTRAMUSCULAR | Status: DC | PRN
  Administered 2014-12-19: 10 mL
  Filled 2014-12-19: qty 10

## 2014-12-31 ENCOUNTER — Other Ambulatory Visit: Payer: BLUE CROSS/BLUE SHIELD | Admitting: *Deleted

## 2014-12-31 ENCOUNTER — Encounter: Payer: Self-pay | Admitting: Hematology

## 2014-12-31 ENCOUNTER — Other Ambulatory Visit (HOSPITAL_BASED_OUTPATIENT_CLINIC_OR_DEPARTMENT_OTHER): Payer: BLUE CROSS/BLUE SHIELD

## 2014-12-31 ENCOUNTER — Telehealth: Payer: Self-pay | Admitting: Hematology

## 2014-12-31 ENCOUNTER — Ambulatory Visit: Payer: BLUE CROSS/BLUE SHIELD | Admitting: *Deleted

## 2014-12-31 ENCOUNTER — Ambulatory Visit (HOSPITAL_BASED_OUTPATIENT_CLINIC_OR_DEPARTMENT_OTHER): Payer: BLUE CROSS/BLUE SHIELD | Admitting: Hematology

## 2014-12-31 ENCOUNTER — Ambulatory Visit (HOSPITAL_BASED_OUTPATIENT_CLINIC_OR_DEPARTMENT_OTHER): Payer: BLUE CROSS/BLUE SHIELD

## 2014-12-31 ENCOUNTER — Other Ambulatory Visit: Payer: Self-pay | Admitting: *Deleted

## 2014-12-31 VITALS — BP 128/84 | HR 65 | Temp 98.3°F | Resp 18 | Ht 69.0 in | Wt 288.5 lb

## 2014-12-31 DIAGNOSIS — C7801 Secondary malignant neoplasm of right lung: Secondary | ICD-10-CM | POA: Diagnosis not present

## 2014-12-31 DIAGNOSIS — D630 Anemia in neoplastic disease: Secondary | ICD-10-CM

## 2014-12-31 DIAGNOSIS — R110 Nausea: Secondary | ICD-10-CM

## 2014-12-31 DIAGNOSIS — D509 Iron deficiency anemia, unspecified: Secondary | ICD-10-CM

## 2014-12-31 DIAGNOSIS — C169 Malignant neoplasm of stomach, unspecified: Secondary | ICD-10-CM | POA: Diagnosis not present

## 2014-12-31 DIAGNOSIS — C786 Secondary malignant neoplasm of retroperitoneum and peritoneum: Secondary | ICD-10-CM

## 2014-12-31 DIAGNOSIS — C7802 Secondary malignant neoplasm of left lung: Secondary | ICD-10-CM

## 2014-12-31 DIAGNOSIS — R630 Anorexia: Secondary | ICD-10-CM

## 2014-12-31 DIAGNOSIS — D6481 Anemia due to antineoplastic chemotherapy: Secondary | ICD-10-CM

## 2014-12-31 DIAGNOSIS — R180 Malignant ascites: Secondary | ICD-10-CM

## 2014-12-31 DIAGNOSIS — E46 Unspecified protein-calorie malnutrition: Secondary | ICD-10-CM

## 2014-12-31 DIAGNOSIS — Z5111 Encounter for antineoplastic chemotherapy: Secondary | ICD-10-CM

## 2014-12-31 LAB — COMPREHENSIVE METABOLIC PANEL (CC13)
ALBUMIN: 2.8 g/dL — AB (ref 3.5–5.0)
ALK PHOS: 143 U/L (ref 40–150)
ALT: 18 U/L (ref 0–55)
AST: 26 U/L (ref 5–34)
Anion Gap: 7 mEq/L (ref 3–11)
BUN: 14.3 mg/dL (ref 7.0–26.0)
CHLORIDE: 108 meq/L (ref 98–109)
CO2: 26 mEq/L (ref 22–29)
Calcium: 8.8 mg/dL (ref 8.4–10.4)
Creatinine: 0.8 mg/dL (ref 0.7–1.3)
Glucose: 95 mg/dl (ref 70–140)
POTASSIUM: 4 meq/L (ref 3.5–5.1)
SODIUM: 141 meq/L (ref 136–145)
Total Bilirubin: 0.51 mg/dL (ref 0.20–1.20)
Total Protein: 6.5 g/dL (ref 6.4–8.3)

## 2014-12-31 LAB — CBC WITH DIFFERENTIAL/PLATELET
BASO%: 0.3 % (ref 0.0–2.0)
BASOS ABS: 0 10*3/uL (ref 0.0–0.1)
EOS ABS: 0.1 10*3/uL (ref 0.0–0.5)
EOS%: 1.3 % (ref 0.0–7.0)
HCT: 37 % — ABNORMAL LOW (ref 38.4–49.9)
HEMOGLOBIN: 12.3 g/dL — AB (ref 13.0–17.1)
LYMPH%: 28.4 % (ref 14.0–49.0)
MCH: 29.4 pg (ref 27.2–33.4)
MCHC: 33.2 g/dL (ref 32.0–36.0)
MCV: 88.3 fL (ref 79.3–98.0)
MONO#: 0.9 10*3/uL (ref 0.1–0.9)
MONO%: 22.1 % — ABNORMAL HIGH (ref 0.0–14.0)
NEUT#: 1.9 10*3/uL (ref 1.5–6.5)
NEUT%: 47.9 % (ref 39.0–75.0)
Platelets: 128 10*3/uL — ABNORMAL LOW (ref 140–400)
RBC: 4.19 10*6/uL — ABNORMAL LOW (ref 4.20–5.82)
RDW: 16.2 % — AB (ref 11.0–14.6)
WBC: 3.9 10*3/uL — ABNORMAL LOW (ref 4.0–10.3)
lymph#: 1.1 10*3/uL (ref 0.9–3.3)

## 2014-12-31 MED ORDER — LEUCOVORIN CALCIUM INJECTION 350 MG
400.0000 mg/m2 | Freq: Once | INTRAVENOUS | Status: AC
  Administered 2014-12-31: 996 mg via INTRAVENOUS
  Filled 2014-12-31: qty 49.8

## 2014-12-31 MED ORDER — SODIUM CHLORIDE 0.9 % IV SOLN
Freq: Once | INTRAVENOUS | Status: AC
  Administered 2014-12-31: 11:00:00 via INTRAVENOUS
  Filled 2014-12-31: qty 4

## 2014-12-31 MED ORDER — OXALIPLATIN CHEMO INJECTION 100 MG/20ML
80.0000 mg/m2 | Freq: Once | INTRAVENOUS | Status: AC
  Administered 2014-12-31: 200 mg via INTRAVENOUS
  Filled 2014-12-31: qty 40

## 2014-12-31 MED ORDER — DEXTROSE 5 % IV SOLN
Freq: Once | INTRAVENOUS | Status: AC
Start: 2014-12-31 — End: 2014-12-31
  Administered 2014-12-31: 11:00:00 via INTRAVENOUS

## 2014-12-31 MED ORDER — SODIUM CHLORIDE 0.9 % IJ SOLN
10.0000 mL | INTRAMUSCULAR | Status: DC | PRN
  Administered 2014-12-31: 10 mL via INTRAVENOUS
  Filled 2014-12-31: qty 10

## 2014-12-31 MED ORDER — PROCHLORPERAZINE MALEATE 10 MG PO TABS
10.0000 mg | ORAL_TABLET | Freq: Once | ORAL | Status: AC
  Administered 2014-12-31: 10 mg via ORAL

## 2014-12-31 MED ORDER — SODIUM CHLORIDE 0.9 % IV SOLN
2280.0000 mg/m2 | INTRAVENOUS | Status: DC
  Administered 2014-12-31: 5700 mg via INTRAVENOUS
  Filled 2014-12-31: qty 114

## 2014-12-31 MED ORDER — PROCHLORPERAZINE MALEATE 10 MG PO TABS
ORAL_TABLET | ORAL | Status: AC
  Filled 2014-12-31: qty 1

## 2014-12-31 NOTE — Progress Notes (Signed)
[facility]  [phone] Fax:([phone]) [phone]  Clinic New Consult Note   Patient Care Team: No Pcp Per Patient as PCP - General (General Practice) 12/31/2014  CHIEF COMPLAINTS Follow up metastatic gastric cancer    Gastric adenocarcinoma: Stage IV   04/23/2014 Imaging CT abdomen showed peritoneal carcinomatosis, ascites, thickning of gastric and GEJ wall, CT chest showed a few small b/l lung nodules.    04/24/2014 Tumor Marker CEA  5.6, CA19.9 13   04/25/2014 Initial Diagnosis Gastric adenocarcinoma: Stage IV   04/25/2014 Pathology Results Stomach mass biopsy showed - ADENOCARCINOMA WITH SIGNET RING FEATURES. ascites cytology (+)   04/25/2014 Procedure EGD: There was a large, nearly circumferential, ulcerated, clearly malignant mass that stradles the GE junction. The vast bulk of the mass lays in the stomach.    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 [facility] He is here for the first office visit after hospital discharge.  He was  admitted on 12/15 with progressive, 4 month history of dysphagia, epigastric abdominal discomfort and bloating, early satiation accompanied by nausea, and postprandial vomiting and diarrhea. He has experienced a 30 lbs weight loss in the process. He has noted increased abdominal girth. Patient denies any shortness of breath, chest pain, GI bleed, hematuria or lower extremity swelling. He denies heavy alcohol intake. Denies tobacco abuse. Denies  risk factors for HIV or hepatitis. He denies any family history of GI malignancies.  CT of the abdomen and pelvis with contrast on 12/15 revealed several pulmonary nodules at the bases bilaterally, profound thickening of the distal esophagus extending beyond the gastroesophageal junction into the proximal stomach,cardia and fundus of the stomach, most evident along the lesser curvature. No pathologic dilatation of small bowel or colon. Extensive upper abdominal and retroperitoneal lymphadenopathy was visualized. Moderate volume of ascites, presumably malignant, was noted. Extensive soft tissue nodularity throughout the omentum, compatible with omental caking and widespread peritoneal metastasis. Additionally, there are other areas of enhancing nodularity along the peritoneal surface. Tiny ventral and umbilical hernias containing predominantly omental fat, including some omental implants. There are no aggressive appearing lytic or blastic lesions noted in the visualized portions of the skeleton.  He underwent an EGD by Dr. JArdis Hughson 04/25/2014, which showed a large fungating mass that straddles the GE junction and occupying the upper 1/3 part of the stomach. Multiple biopsy was taken which showed adenocarcinoma with signet ring features. He also underwent a paracentesis with 4.5 L fluid is removed on 04/26/2014. Cytology was positive for adenocarcinoma with signet ring features, consistent with metastatic disease.  He was discharged to home on 04/27/2014. He did feel a slightly informant of his abdominal bloating after the paracentesis, but not much improvement other symptoms. He had Mediport placement and the second paracentesis was again 4 L fluids removal on 05/02/2014. His appetite is low, he drinks ensure 3 bottles a day, some soup and a very little other foods. His energy level is also low, but able to take care of  all his daily needs, and move around with about much difficulty. His abdominal  discomfort/pain are relatively controlled by morphine 15 mg at night and tramadol 2-3 times during the day. He denies any fever, chills, cough, chest pain or any in the other discomfort.  [employer] genetic test (5/46/2703): NF1 splice site [id number] del 47 TP53 R213*  INTERIM HISTORY: Samuel Durham returns for follow up and cycle 18 chemotherapy. He is doing well, and tolerating treatment very well. He denies significant pain, nausea, abdominal bloating, or other symptoms. He has good appetite and her energy level, functions very well at home.   MEDICAL HISTORY:  Past Medical History  Diagnosis Date  . Medical history non-contributory   . gastric ca dx'd 04/2014    SURGICAL HISTORY: Past Surgical History  Procedure Laterality Date  . Squamous cell carcinoma excision  approx March 2015    removed from top of head  . Esophagogastroduodenoscopy (egd) with propofol N/A 04/25/2014    Procedure: ESOPHAGOGASTRODUODENOSCOPY (EGD) WITH PROPOFOL;  Surgeon: Milus Banister, MD;  Location: [facility];  Service: Endoscopy;  Laterality: N/A;    SOCIAL HISTORY: Social History   Social History  . Marital Status: Single    Spouse Name: N/A  . Number of Children: N/A  . Years of Education: N/A   Occupational History  . Not on file.   Social History Main Topics  . Smoking status: Never Smoker   . Smokeless tobacco: Never Used  . Alcohol Use: No  . Drug Use: No  . Sexual Activity: Not on file   Other Topics Concern  . Not on file   Social History Narrative    FAMILY HISTORY: Family History  Problem Relation Age of Onset  . Breast cancer Sister 96   . Hypertension Mother   . Hypertension Father   Maternal aunt breast cancer at age of 30 Maternal cousin ovarian cancer at age of 45 Maternal ancle prostate cancer age of 80   ALLERGIES:  has No Known Allergies.  MEDICATIONS:  Current Outpatient Prescriptions  Medication Sig Dispense Refill  . docusate sodium 100 MG CAPS Take  100 mg by mouth 2 (two) times daily. 10 capsule 0  . lidocaine-prilocaine (EMLA) cream Apply 1 application topically as needed. 30 g 0  . mirtazapine (REMERON) 15 MG tablet Take 1 tablet (15 mg total) by mouth at bedtime. 30 tablet 1  . morphine (MS CONTIN) 15 MG 12 hr tablet Take 1 tablet (15 mg total) by mouth every 12 (twelve) hours. 60 tablet 0  . ondansetron (ZOFRAN) 4 MG tablet Take 1 tablet (4 mg total) by mouth every 8 (eight) hours as needed for nausea or vomiting. 20 tablet 0  . pantoprazole (PROTONIX) 40 MG tablet Take 1 tablet (40 mg total) by mouth daily. 30 tablet 0  . prochlorperazine (COMPAZINE) 10 MG tablet Take 1 tablet (10 mg total) by mouth every 6 (six) hours as needed for nausea or vomiting. 30 tablet 3  . traMADol (ULTRAM) 50 MG tablet Take 1-2 tablets (50-100 mg total) by mouth every 6 (six) hours as needed for moderate pain. 30 tablet 0   No current facility-administered medications for this visit.    REVIEW OF SYSTEMS:   Constitutional: Denies fevers, chills or abnormal night sweats, no recent weight loss Eyes: Denies blurriness of vision, double vision or watery eyes Ears, nose, mouth, throat, and face: Denies mucositis or sore throat Respiratory: Denies cough, dyspnea or wheezes Cardiovascular: Denies palpitation, chest discomfort or lower extremity  swelling Gastrointestinal:  Less nausea, abdominal bloating and discomfort Skin: Denies abnormal skin rashes Lymphatics: Denies new lymphadenopathy or easy bruising Neurological:Denies numbness, tingling or new weaknesses Behavioral/Psych: Mood is stable, no new changes  All other systems were reviewed with the patient and are negative except those mentioned in the history.  PHYSICAL EXAMINATION: ECOG PERFORMANCE STATUS: 1 GENERAL:alert, no distress and comfortable SKIN: skin color, texture, turgor are normal, no rashes or significant lesions EYES: normal, conjunctiva are pink and non-injected, sclera  clear OROPHARYNX:no exudate, no erythema and lips, buccal mucosa, and tongue normal  NECK: supple, thyroid normal size, non-tender, without nodularity LYMPH:  no palpable lymphadenopathy in the cervical, axillary or inguinal LUNGS: clear to auscultation and percussion with normal breathing effort HEART: regular rate & rhythm and no murmurs and no lower extremity edema ABDOMEN:abdomen soft, non-tender and normal bowel sounds, probable small amount of ascites. Musculoskeletal:no cyanosis of digits and no clubbing  PSYCH: alert & oriented x 3 with fluent speech NEURO: no focal motor/sensory deficits  LABORATORY DATA:  I have reviewed the data as listed CBC Latest Ref Rng 12/17/2014 12/03/2014 11/19/2014  WBC 4.0 - 10.3 10e3/uL 4.1 4.0 3.9(L)  Hemoglobin 13.0 - 17.1 g/dL 12.6(L) 12.3(L) 12.7(L)  Hematocrit 38.4 - 49.9 % 38.0(L) 37.1(L) 38.0(L)  Platelets 140 - 400 10e3/uL 140 122(L) 118(L)      CMP Latest Ref Rng 12/17/2014 12/03/2014 11/19/2014  Glucose 70 - 140 mg/dl 96 101 112  BUN 7.0 - 26.0 mg/dL 15.7 15.1 15.2  Creatinine 0.7 - 1.3 mg/dL 0.8 0.8 0.8  Sodium 136 - 145 mEq/L 140 139 141  Potassium 3.5 - 5.1 mEq/L 4.4 3.8 3.9  Chloride 96 - 112 mEq/L - - -  CO2 22 - 29 mEq/L _0 Calcium 8.4 - 10.4 mg/dL 8.7 8.5 8.9  Total Protein 6.4 - 8.3 g/dL 6.7 6.5 6.5  Total Bilirubin 0.20 - 1.20 mg/dL 0.64 0.43 0.46  Alkaline Phos 40 - 150 U/L 136 154(H) 162(H)  AST 5 - 34 U/L _1 ALT 0 - 55 U/L _2 CEA (Order [id number])      CEA  Status: Finalresult Visible to patient:  MyChart Nextappt: Today at 10:15 AM in Oncology [facility] E17) Dx:  Gastric adenocarcinoma: Stage IV           Ref Range 2wk ago  61moago  282mogo     CEA 0.0 - 5.0 ng/mL 1.8 1.7 1.8           Pathology report  Diagnosis  PERITONEAL/ASCITIC FLUID(SPECIMEN 1 OF 1 COLLECTED 04/26/14): METASTATIC ADENOCARCINOMA WITH SIGNET RING CELL FEATURES.  Stomach, biopsy, r/o  cancer 04/25/14 - ADENOCARCINOMA WITH SIGNET RING FEATURES. Microscopic Comment The results were called to Dr. JaArdis Hughsn 04/26/2014. (JDP:kh 04/26/14) JOClaudette LawsD Pathologist, Electronic Signature (Case signed 04/26/2014)  HER-2/NEU BY CISH - NO AMPLIFICATION OF HER-2 DETECTED. RESULT RATIO OF HER2: CEP 17 SIGNALS 1.20 AVERAGE HER2 COPY NUMBER PER CELL 2.10  ADDITIONAL INFORMATION: Mismatch Repair (MMR) Protein Immunohistochemistry (IHC) IHC Expression Result: MLH1: Preserved nuclear expression (greater 50% tumor expression) MSH2: Preserved nuclear expression (greater 50% tumor expression) MSH6: Preserved nuclear expression (greater 50% tumor expression) PMS2: Preserved nuclear expression (greater 50% tumor expression) * Internal control demonstrates intact nuclear expression Interpretation: NORMAL There is preserved expression of the major and minor MMR proteins. There is a very low probability that microsatellite instability (MSI) is present. However, certain clinically significant MMR protein mutations may  result in preservation of nuclear expression. It is recommended that the preservation of protein expression be correlated with molecular based MSI testing.  [employer] testing: (+) RS85 and NF1   RADIOGRAPHIC STUDIES: I have personally reviewed the radiological images as listed and agreed with the findings in the report.  Ct Chest, abdomen and pelvis Wo Contrast12/24/2015    IMPRESSION:  1. Findings, as above, highly concerning for either esophageal or gastric primary malignancy, with metastatic lymphadenopathy in the upper abdomen and retroperitoneum, widespread intraperitoneal spread of disease, malignant ascites, and multiple pulmonary nodules in the visualize lung bases concerning for pulmonary metastasis. Oncologic evaluation is recommended.  2. Additional incidental findings, as above. These results were called by telephone at the time of interpretation  on 04/23/2014 at 7:53 pm to Tees Toh , who verbally acknowledged these results.   Electronically Signed   By: Vinnie Langton M.D.   On: 04/23/2014 19:56   CT CAP 06/24/2014 IMPRESSION: 1. Progressive low density and calcifications within the extensive upper abdominal lymphadenopathy, likely related to partial treatment. Some of the lymph nodes do appear slightly larger. The irregular infiltrating mass involving the gastric cardia appears slightly improved. 2. No evidence of hepatic metastatic disease. 3. Overall volume of ascites has improved, although extensive changes of peritoneal carcinomatosis have not significantly changed. 4. No significant change in bilateral pulmonary nodules consistent with metastatic disease. 5. Resolution of bilateral pleural effusions.  CT chest, abdomen and pelvis with contrast on 08/26/2014 IMPRESSION: Overall stable to slight progression of disease. Existing pulmonary nodules are stable to slightly progressed with no new pulmonary nodules evident. The bulky upper abdominal lymphadenopathy is stable to minimally progressed while no substantial change in the primary infiltrating gastric neoplasm is discernible. Omental disease appears stable. There is slightly more ascites on today's study.  CT chest, abdomen and pelvis with contrast on 10/21/2014 IMPRESSION: 1. Compared to the prior examination there appears to be slight progression of disease predominantly manifest by worsening gastric mucosal thickening, particularly along the greater curvature of the stomach in the region of the cardia and fundus, with increasing adjacent mesenteric/omental disease and slight increase in malignant ascites. Previously described upper abdominal lymphadenopathy is generally stable to slightly decreased compared to the prior examination, and previously noted pulmonary nodules are unchanged. 2. Additional incidental findings, as above.  CT chest, abdomen  and pelvis with contrast, 12/11/2014 at [facility] Impression: #1 not much interval change into gastroesophageal junction mass next #2 number change in the lymph node, lung and peritoneal metastatic disease #3 small amount of intraperitoneal flus is slightly increased.   EGD WITH BIOPSY 04/25/2014 ENDOSCOPIC IMPRESSION: There was a large, nearly circumferential, ulcerated, clearly malignant mass that stradles the GE junction. The vast bulk of the mass lays in the stomach, occupying about 1/3 of the stomach. The mass extends into the esophagus for about 4cm above the GE junction. Mutiple biopsies were taken from the gastric portion of the mass   I ASSESSMENT & PLAN:  39 year old gentleman without significant past medical history presents with abdominal bloating, nausea, vomiting, diarrhea, anemia and weight loss. EGD showed a large mass arising from the gastric fundus and extending into GE junction, biopsy showed adenocarcinoma with signet ring features, CT scan is consistent with peritoneal carcinomatosis and bilateral lung metastasis. Ascites cytology was positive for malignant cells.  1. 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 HER-2 negative, MMR-normal -I discussed his surgical biopsy results, cytology results, skin  findings with patient and his family members, including his parents and 2 sisters. Unfortunately, this is widely metastatic, not curable, and overall prognosis is very poor. Literature has showed gastric adenocarcinoma with signet ring features has overall worse outcome comparing to others. His life expectancy is probably less than 6 months without treatment. -He has started first-line chemotherapy FOLFOX, tolerated well, and his symptoms have much  improved. -His tumor is negative for HER-2 overexpression, no role for trastuzumab. -We also checked his tumor for MMR, which was normal so he would unlikely benefit from  immunotherapy.,  -[facility] genomic test reviewed no actionable mutation -His first restaging CT scan showed mild partial response, overall stable disease. His ascites has significantly decreased. Pleural effusion resolved. He is clinically doing well.  -I discussed his third restaging CT findings with patient and his father. It seems he had a mixed response, slight increase of the gastric wall thickness and the ascites, abdominal lymph nodes are smaller, overall I consider as stable disease or slight disease progression.  -His tumor marker CEA has came down to normal. He is clinically doing well, I recommended continuing FOLFOX. However, if his ascites returns significantly, I'll change him to second line FOLFIRI.  -he was interested in pursuing [facility] in [place], which is a intravenous infusion for total of 2 weeks. Giving the slight disease progression on the scan, i.e. recommends to hold it for now.  -due to his family history of malignancy, he has had genetic testing at [facility]. It was negative for the comprehensive cancer panel, which included APC, BRCA 1/2, CDE H1, MLH1, MSH2, MSH6, PMS2, etc. -He was seen at [facility] recently, clinical trial options were exported, he underwent further genetic testing, and the treatment recommendation was to continue current chemotherapy giving the stable disease. -Lab are reviewed, mild thrombocytopenia, adequate for treatment, we'll proceed cycle 18 chemotherapy today  2. Malignant ascites -s/p paracentesis at the beginning of his diagnosis -Slightly increased on recent CT CT scan, we'll monitor closely.  3. Anorexia, nausea, malnutrition -Nearly resolved, he feels much better lately -His albumin level is still low, the nutrition secondary to malignancy. -I encouraged him to take protein powder and diet supplements as needed.  4. Anemia, multifactorial (iron deficient anemia, cancer related and chemotherapy induced ) -His  ferritin level was normal at 171, but he has low iron and saturation, likely some degree of iron deficient anemia. -He has received IV Feraheme 510 mg twice, 2 weeks apart -Blood transfusion as needed during chemotherapy if her hemoglobin below 8. -Mild anemia has been stable.   Plan #1 cycle 18 FOLFOX today,  No 5-FU bolus, 5-fu 5% dose reduction, no Neulasta on day 3 with this cycle  #2 RTC in 2 weeks for cycle 19   The patient knows to call the clinic with any problems, questions or concerns.  I spent 20 minutes counseling the patient face to face. The total time spent in the appointment was 25  minutes and more than 50% was on counseling.     Truitt Merle, MD 12/31/2014

## 2014-12-31 NOTE — Patient Instructions (Signed)
[facility] Discharge Instructions for Patients Receiving Chemotherapy  Today you received the following chemotherapy agents Oxaliplatin/Leucovorin/Fluorouracil.  To help prevent nausea and vomiting after your treatment, we encourage you to take your nausea medication as directed.   If you develop nausea and vomiting that is not controlled by your nausea medication, call the clinic.   BELOW ARE SYMPTOMS THAT SHOULD BE REPORTED IMMEDIATELY:  *FEVER GREATER THAN 100.5 F  *CHILLS WITH OR WITHOUT FEVER  NAUSEA AND VOMITING THAT IS NOT CONTROLLED WITH YOUR NAUSEA MEDICATION  *UNUSUAL SHORTNESS OF BREATH  *UNUSUAL BRUISING OR BLEEDING  TENDERNESS IN MOUTH AND THROAT WITH OR WITHOUT PRESENCE OF ULCERS  *URINARY PROBLEMS  *BOWEL PROBLEMS  UNUSUAL RASH Items with * indicate a potential emergency and should be followed up as soon as possible.  Feel free to call the clinic you have any questions or concerns. The clinic phone number is [phone].  Please show the CHEMO ALERT CARD at check-in to the Emergency Department and triage nurse.    

## 2014-12-31 NOTE — Telephone Encounter (Signed)
per pt req to have 9/20 trmt sch-sent MW email to sch. Adv pt he can get updated copy on 9/6

## 2015-01-02 ENCOUNTER — Ambulatory Visit (HOSPITAL_BASED_OUTPATIENT_CLINIC_OR_DEPARTMENT_OTHER): Payer: BLUE CROSS/BLUE SHIELD

## 2015-01-02 ENCOUNTER — Encounter: Payer: Self-pay | Admitting: Nurse Practitioner

## 2015-01-02 ENCOUNTER — Ambulatory Visit (HOSPITAL_BASED_OUTPATIENT_CLINIC_OR_DEPARTMENT_OTHER): Payer: BLUE CROSS/BLUE SHIELD | Admitting: Nurse Practitioner

## 2015-01-02 ENCOUNTER — Ambulatory Visit: Payer: BLUE CROSS/BLUE SHIELD

## 2015-01-02 VITALS — BP 128/75 | HR 71 | Temp 97.8°F | Resp 18

## 2015-01-02 DIAGNOSIS — C169 Malignant neoplasm of stomach, unspecified: Secondary | ICD-10-CM

## 2015-01-02 DIAGNOSIS — T829XXA Unspecified complication of cardiac and vascular prosthetic device, implant and graft, initial encounter: Secondary | ICD-10-CM | POA: Diagnosis not present

## 2015-01-02 MED ORDER — HEPARIN SOD (PORK) LOCK FLUSH 100 UNIT/ML IV SOLN
500.0000 [IU] | Freq: Once | INTRAVENOUS | Status: AC | PRN
  Administered 2015-01-02: 500 [IU]
  Filled 2015-01-02: qty 5

## 2015-01-02 MED ORDER — CEPHALEXIN 500 MG PO CAPS: 500.0000 mg | ORAL_CAPSULE | Freq: Four times a day (QID) | ORAL | Status: DC

## 2015-01-02 MED ORDER — SODIUM CHLORIDE 0.9 % IJ SOLN
10.0000 mL | INTRAMUSCULAR | Status: DC | PRN
  Administered 2015-01-02: 10 mL
  Filled 2015-01-02: qty 10

## 2015-01-02 NOTE — Assessment & Plan Note (Addendum)
Patient presented to the [facility] today to discontinue his chemotherapy pump.  Following the discontinuation of the pump- it was noted that the needle insertion site was slightly larger; and the surrounding area was slightly red with trace edema.  There is no tenderness or warmth to site.   Pt remain afebrile; and appears nontoxic.   Was able to obtain a culture from the scant clear drainage from the site; pending culture results.   Will prescribe keflex antibiotics for tx of any mild portacath infection.   Confirmed that the portacath was inserted in January 2016 per Dr. Wynne Dust from [facility]. Will notify Dr. Katrinka Blazing office of portacath issue and planned follow up.

## 2015-01-02 NOTE — Progress Notes (Signed)
Port site reddened with a small amount of brown/tan discharge on bio-patch. Pt states that it is sometimes red when deaccessing port "but not this red". Pt states that nothing was done different this time compared to any other time going home with the pump. The top of the dressing appeared to have loosen some.  Selena Lesser NP aware and to infusion room to assess.

## 2015-01-02 NOTE — Assessment & Plan Note (Signed)
Patient received cycle 18 of his FOLFOX chemotherapy on 12/31/2014.  Patient presented to the [facility] today for pump discontinuation.  Patient has plans to return on 01/14/2015 for labs, visit, and his next cycle of chemotherapy.

## 2015-01-02 NOTE — Progress Notes (Signed)
Late post: 1230: pt instructed to call clinic if port site worsens or if it becomes tender and/or painful. Pt verbalizes understanding.

## 2015-01-02 NOTE — Progress Notes (Signed)
SYMPTOM MANAGEMENT CLINIC   HPI: Samuel Durham 39 y.o. male diagnosed with gastric adenocarcinoma.  Currently undergoing FOLFOX chemotherapy regimen.  Patient presented to the [facility] today to discontinue his chemotherapy pump.  Following the discontinuation of the pump- it was noted that the needle insertion site was slightly larger; and the surrounding area was slightly red with trace edema.  There is no tenderness or warmth to site.   Pt remain afebrile; and appears nontoxic.   Was able to obtain a culture from the scant clear drainage from the site; pending culture results.   Will prescribe keflex antibiotics for tx of any mild portacath infection.   Confirmed that the portacath was inserted in January 2016 per Dr. Wynne Dust from [facility]. Will notify Dr. Katrinka Blazing office of portacath issue and planned follow up  HPI  ROS  Past Medical History  Diagnosis Date  . Medical history non-contributory   . gastric ca dx'd 04/2014    Past Surgical History  Procedure Laterality Date  . Squamous cell carcinoma excision  approx March 2015    removed from top of head  . Esophagogastroduodenoscopy (egd) with propofol N/A 04/25/2014    Procedure: ESOPHAGOGASTRODUODENOSCOPY (EGD) WITH PROPOFOL;  Surgeon: Milus Banister, MD;  Location: [facility];  Service: Endoscopy;  Laterality: N/A;    has Abdominal pain; QT prolongation; Protein-calorie malnutrition, severe; Gastric adenocarcinoma: Stage IV; Ascites; Generalized abdominal pain; Abdominal pain, generalized; Peritoneal carcinomatosis; Anemia, iron deficiency; Anemia in neoplastic disease; and Central line complication on his problem list.    has No Known Allergies.    Medication List       This list is accurate as of: 01/02/15  1:02 PM.  Always use your most recent med list.               cephALEXin 500 MG capsule  Commonly known as:  KEFLEX  Take 1 capsule (500 mg total) by mouth 4 (four)  times daily.     DSS 100 MG Caps  Take 100 mg by mouth 2 (two) times daily.     lidocaine-prilocaine cream  Commonly known as:  EMLA  Apply 1 application topically as needed.     mirtazapine 15 MG tablet  Commonly known as:  REMERON  Take 1 tablet (15 mg total) by mouth at bedtime.     morphine 15 MG 12 hr tablet  Commonly known as:  MS CONTIN  Take 1 tablet (15 mg total) by mouth every 12 (twelve) hours.     ondansetron 4 MG tablet  Commonly known as:  ZOFRAN  Take 1 tablet (4 mg total) by mouth every 8 (eight) hours as needed for nausea or vomiting.     pantoprazole 40 MG tablet  Commonly known as:  PROTONIX  Take 1 tablet (40 mg total) by mouth daily.     prochlorperazine 10 MG tablet  Commonly known as:  COMPAZINE  Take 1 tablet (10 mg total) by mouth every 6 (six) hours as needed for nausea or vomiting.     traMADol 50 MG tablet  Commonly known as:  ULTRAM  Take 1-2 tablets (50-100 mg total) by mouth every 6 (six) hours as needed for moderate pain.         PHYSICAL EXAMINATION  Oncology Vitals 01/02/2015 12/31/2014 12/19/2014 12/17/2014 12/05/2014 12/03/2014 11/21/2014  Height - 175 cm - 175 cm - 175 cm -  Weight - 130.863 kg - 128.867 kg - 131.452 kg -  Weight (lbs) -  288 lbs 8 oz - 284 lbs 2 oz - 289 lbs 13 oz -  BMI (kg/m2) - 42.6 kg/m2 - 41.95 kg/m2 - 42.8 kg/m2 -  Temp 97.8 98.3 98.1 97.8 98.6 98.3 97.9  Pulse 71 65 67 77 65 69 72  Resp 18 18 18 18 18 18  -  SpO2 100 100 100 100 99 100 100  BSA (m2) - 2.52 m2 - 2.51 m2 - 2.53 m2 -   BP Readings from Last 3 Encounters:  01/02/15 128/75  12/31/14 128/84  12/19/14 114/77    Physical Exam  Constitutional: He is oriented to person, place, and time and well-developed, well-nourished, and in no distress.  HENT:  Head: Normocephalic and atraumatic.  Eyes: Conjunctivae and EOM are normal. Pupils are equal, round, and reactive to light. Right eye exhibits no discharge. Left eye exhibits no discharge. No scleral  icterus.  Neck: Normal range of motion.  Pulmonary/Chest: Effort normal. No respiratory distress. He exhibits no tenderness.  Musculoskeletal: Normal range of motion. He exhibits no edema or tenderness.  Neurological: He is alert and oriented to person, place, and time. Gait normal.  Skin: Skin is warm and dry. No rash noted. There is erythema. No pallor.  Patient presented to the [facility] today to discontinue his chemotherapy pump.  Following the discontinuation of the pump- it was noted that the needle insertion site was slightly larger; and the surrounding area was slightly red with trace edema.  There is no tenderness or warmth to site.     Psychiatric: Affect normal.  Nursing note and vitals reviewed.   LABORATORY DATA:. Appointment on 12/31/2014  Component Date Value Ref Range Status  . WBC 12/31/2014 3.9* 4.0 - 10.3 10e3/uL Final  . NEUT# 12/31/2014 1.9  1.5 - 6.5 10e3/uL Final  . HGB 12/31/2014 12.3* 13.0 - 17.1 g/dL Final  . HCT 12/31/2014 37.0* 38.4 - 49.9 % Final  . Platelets 12/31/2014 128* 140 - 400 10e3/uL Final  . MCV 12/31/2014 88.3  79.3 - 98.0 fL Final  . MCH 12/31/2014 29.4  27.2 - 33.4 pg Final  . MCHC 12/31/2014 33.2  32.0 - 36.0 g/dL Final  . RBC 12/31/2014 4.19* 4.20 - 5.82 10e6/uL Final  . RDW 12/31/2014 16.2* 11.0 - 14.6 % Final  . lymph# 12/31/2014 1.1  0.9 - 3.3 10e3/uL Final  . MONO# 12/31/2014 0.9  0.1 - 0.9 10e3/uL Final  . Eosinophils Absolute 12/31/2014 0.1  0.0 - 0.5 10e3/uL Final  . Basophils Absolute 12/31/2014 0.0  0.0 - 0.1 10e3/uL Final  . NEUT% 12/31/2014 47.9  39.0 - 75.0 % Final  . LYMPH% 12/31/2014 28.4  14.0 - 49.0 % Final  . MONO% 12/31/2014 22.1* 0.0 - 14.0 % Final  . EOS% 12/31/2014 1.3  0.0 - 7.0 % Final  . BASO% 12/31/2014 0.3  0.0 - 2.0 % Final  . Sodium 12/31/2014 141  136 - 145 mEq/L Final  . Potassium 12/31/2014 4.0  3.5 - 5.1 mEq/L Final  . Chloride 12/31/2014 108  98 - 109 mEq/L Final  . CO2 12/31/2014 26  22 - 29 mEq/L  Final  . Glucose 12/31/2014 95  70 - 140 mg/dl Final  . BUN 12/31/2014 14.3  7.0 - 26.0 mg/dL Final  . Creatinine 12/31/2014 0.8  0.7 - 1.3 mg/dL Final  . Total Bilirubin 12/31/2014 0.51  0.20 - 1.20 mg/dL Final  . Alkaline Phosphatase 12/31/2014 143  40 - 150 U/L Final  . AST 12/31/2014 26  5 -  34 U/L Final  . ALT 12/31/2014 18  0 - 55 U/L Final  . Total Protein 12/31/2014 6.5  6.4 - 8.3 g/dL Final  . Albumin 12/31/2014 2.8* 3.5 - 5.0 g/dL Final  . Calcium 12/31/2014 8.8  8.4 - 10.4 mg/dL Final  . Anion Gap 12/31/2014 7  3 - 11 mEq/L Final  . EGFR 12/31/2014 >90  >90 ml/min/1.73 m2 Final   eGFR is calculated using the CKD-EPI Creatinine Equation (2009)   Right chest:      RADIOGRAPHIC STUDIES: No results found.  ASSESSMENT/PLAN:    Central line complication Patient presented to the [facility] today to discontinue his chemotherapy pump.  Following the discontinuation of the pump- it was noted that the needle insertion site was slightly larger; and the surrounding area was slightly red with trace edema.  There is no tenderness or warmth to site.   Pt remain afebrile; and appears nontoxic.   Was able to obtain a culture from the scant clear drainage from the site; pending culture results.   Will prescribe keflex antibiotics for tx of any mild portacath infection.   Confirmed that the portacath was inserted in January 2016 per Dr. Wynne Dust from [facility]. Will notify Dr. Katrinka Blazing office of portacath issue and planned follow up.     Gastric adenocarcinoma: Stage IV Patient received cycle 18 of his FOLFOX chemotherapy on 12/31/2014.  Patient presented to the [facility] today for pump discontinuation.  Patient has plans to return on 01/14/2015 for labs, visit, and his next cycle of chemotherapy.  Patient stated understanding of all instructions; and was in agreement with this plan of care. The patient knows to call the clinic with any problems,  questions or concerns.   Review/collaboration with Dr. Burr Medico  regarding all aspects of patient's visit today.   Total time spent with patient was 25 minutes;  with greater than 75 percent of that time spent in face to face counseling regarding patient's symptoms,  and coordination of care and follow up.  Disclaimer:This dictation was prepared with Dragon/digital dictation along with [employer]. Any transcriptional errors that result from this process are unintentional.  Drue Second, NP 01/02/2015

## 2015-01-02 NOTE — Progress Notes (Signed)
Addendum / Correction:   Confirmed that Interventional Radiologist Jacqulynn Cadet, MD inserted right portacath in January 2016.   Advised IR Department of right chest PAC issues; and they stated that they would document all regarding our phone call.

## 2015-01-04 LAB — WOUND CULTURE

## 2015-01-06 NOTE — Progress Notes (Signed)
Port a [employer] appointment

## 2015-01-07 ENCOUNTER — Telehealth: Payer: Self-pay | Admitting: *Deleted

## 2015-01-07 NOTE — Telephone Encounter (Signed)
Spoke to pt to check status of port site since concern of infection on 8/25. Pt states he has noticed marked improvement. No drainage or discoloration. Pt denies pain. Confirmed appt on 9/6 with him. Pt denies and further questions or concerns.

## 2015-01-14 ENCOUNTER — Ambulatory Visit: Payer: BLUE CROSS/BLUE SHIELD

## 2015-01-14 ENCOUNTER — Ambulatory Visit (HOSPITAL_BASED_OUTPATIENT_CLINIC_OR_DEPARTMENT_OTHER): Payer: BLUE CROSS/BLUE SHIELD | Admitting: Hematology

## 2015-01-14 ENCOUNTER — Other Ambulatory Visit (HOSPITAL_BASED_OUTPATIENT_CLINIC_OR_DEPARTMENT_OTHER): Payer: BLUE CROSS/BLUE SHIELD

## 2015-01-14 ENCOUNTER — Telehealth: Payer: Self-pay | Admitting: Hematology

## 2015-01-14 ENCOUNTER — Ambulatory Visit (HOSPITAL_BASED_OUTPATIENT_CLINIC_OR_DEPARTMENT_OTHER): Payer: BLUE CROSS/BLUE SHIELD

## 2015-01-14 ENCOUNTER — Encounter: Payer: Self-pay | Admitting: Hematology

## 2015-01-14 VITALS — BP 150/75 | HR 66 | Temp 98.3°F | Resp 20 | Ht 69.0 in | Wt 292.0 lb

## 2015-01-14 DIAGNOSIS — C7801 Secondary malignant neoplasm of right lung: Secondary | ICD-10-CM

## 2015-01-14 DIAGNOSIS — C169 Malignant neoplasm of stomach, unspecified: Secondary | ICD-10-CM

## 2015-01-14 DIAGNOSIS — Z95828 Presence of other vascular implants and grafts: Secondary | ICD-10-CM

## 2015-01-14 DIAGNOSIS — C786 Secondary malignant neoplasm of retroperitoneum and peritoneum: Secondary | ICD-10-CM

## 2015-01-14 DIAGNOSIS — Z5111 Encounter for antineoplastic chemotherapy: Secondary | ICD-10-CM | POA: Diagnosis not present

## 2015-01-14 DIAGNOSIS — C7802 Secondary malignant neoplasm of left lung: Secondary | ICD-10-CM

## 2015-01-14 LAB — CBC WITH DIFFERENTIAL/PLATELET
BASO%: 0.2 % (ref 0.0–2.0)
Basophils Absolute: 0 10*3/uL (ref 0.0–0.1)
EOS%: 1.6 % (ref 0.0–7.0)
Eosinophils Absolute: 0.1 10*3/uL (ref 0.0–0.5)
HCT: 37.7 % — ABNORMAL LOW (ref 38.4–49.9)
HGB: 12.6 g/dL — ABNORMAL LOW (ref 13.0–17.1)
LYMPH%: 29.1 % (ref 14.0–49.0)
MCH: 29.7 pg (ref 27.2–33.4)
MCHC: 33.4 g/dL (ref 32.0–36.0)
MCV: 88.9 fL (ref 79.3–98.0)
MONO#: 1 10*3/uL — ABNORMAL HIGH (ref 0.1–0.9)
MONO%: 22.1 % — AB (ref 0.0–14.0)
NEUT%: 47 % (ref 39.0–75.0)
NEUTROS ABS: 2 10*3/uL (ref 1.5–6.5)
Platelets: 123 10*3/uL — ABNORMAL LOW (ref 140–400)
RBC: 4.24 10*6/uL (ref 4.20–5.82)
RDW: 16.4 % — ABNORMAL HIGH (ref 11.0–14.6)
WBC: 4.3 10*3/uL (ref 4.0–10.3)
lymph#: 1.3 10*3/uL (ref 0.9–3.3)
nRBC: 0 % (ref 0–0)

## 2015-01-14 LAB — COMPREHENSIVE METABOLIC PANEL (CC13)
ALBUMIN: 2.8 g/dL — AB (ref 3.5–5.0)
ALK PHOS: 140 U/L (ref 40–150)
ALT: 20 U/L (ref 0–55)
AST: 31 U/L (ref 5–34)
Anion Gap: 6 mEq/L (ref 3–11)
BILIRUBIN TOTAL: 0.53 mg/dL (ref 0.20–1.20)
BUN: 17.2 mg/dL (ref 7.0–26.0)
CALCIUM: 8.9 mg/dL (ref 8.4–10.4)
CO2: 27 mEq/L (ref 22–29)
Chloride: 107 mEq/L (ref 98–109)
Creatinine: 0.8 mg/dL (ref 0.7–1.3)
EGFR: >90 mL/min/{1.73_m2} (ref >90)
GLUCOSE: 97 mg/dL (ref 70–140)
POTASSIUM: 4.3 meq/L (ref 3.5–5.1)
SODIUM: 140 meq/L (ref 136–145)
TOTAL PROTEIN: 6.8 g/dL (ref 6.4–8.3)

## 2015-01-14 MED ORDER — DEXTROSE 5 % IV SOLN
Freq: Once | INTRAVENOUS | Status: AC
  Administered 2015-01-14: 11:00:00 via INTRAVENOUS

## 2015-01-14 MED ORDER — PROCHLORPERAZINE MALEATE 10 MG PO TABS
ORAL_TABLET | ORAL | Status: AC
  Filled 2015-01-14: qty 1

## 2015-01-14 MED ORDER — DEXAMETHASONE SODIUM PHOSPHATE 100 MG/10ML IJ SOLN
Freq: Once | INTRAMUSCULAR | Status: AC
  Administered 2015-01-14: 11:00:00 via INTRAVENOUS
  Filled 2015-01-14: qty 4

## 2015-01-14 MED ORDER — SODIUM CHLORIDE 0.9 % IJ SOLN
10.0000 mL | INTRAMUSCULAR | Status: DC | PRN
  Administered 2015-01-14: 10 mL via INTRAVENOUS
  Filled 2015-01-14: qty 10

## 2015-01-14 MED ORDER — PROCHLORPERAZINE MALEATE 10 MG PO TABS
10.0000 mg | ORAL_TABLET | Freq: Once | ORAL | Status: AC
Start: 2015-01-14 — End: 2015-01-14
  Administered 2015-01-14: 10 mg via ORAL

## 2015-01-14 MED ORDER — SODIUM CHLORIDE 0.9 % IV SOLN
2280.0000 mg/m2 | INTRAVENOUS | Status: DC
  Administered 2015-01-14: 5700 mg via INTRAVENOUS
  Filled 2015-01-14: qty 114

## 2015-01-14 MED ORDER — LEUCOVORIN CALCIUM INJECTION 350 MG
400.0000 mg/m2 | Freq: Once | INTRAVENOUS | Status: AC
  Administered 2015-01-14: 996 mg via INTRAVENOUS
  Filled 2015-01-14: qty 49.8

## 2015-01-14 MED ORDER — OXALIPLATIN CHEMO INJECTION 100 MG/20ML
80.0000 mg/m2 | Freq: Once | INTRAVENOUS | Status: AC
  Administered 2015-01-14: 200 mg via INTRAVENOUS
  Filled 2015-01-14: qty 40

## 2015-01-14 NOTE — Telephone Encounter (Signed)
Gave and printed appt sched and avs for pt for Sept and OCT °

## 2015-01-14 NOTE — Progress Notes (Signed)
[place]  [phone] [phone]  Clinic New Consult Note   Patient Care Team: No Pcp Per Patient as PCP - General (General Practice) 01/14/2015  CHIEF COMPLAINTS Follow up metastatic gastric cancer    Gastric adenocarcinoma: Stage IV   04/23/2014 Imaging CT abdomen showed peritoneal carcinomatosis, ascites, thickning of gastric and GEJ wall, CT chest showed a few small b/l lung nodules.    04/24/2014 Tumor Marker CEA  5.6, CA19.9 13   04/25/2014 Initial Diagnosis Gastric adenocarcinoma: Stage IV   04/25/2014 Pathology Results Stomach mass biopsy showed - ADENOCARCINOMA WITH SIGNET RING FEATURES. ascites cytology (+)   04/25/2014 Procedure EGD: There was a large, nearly circumferential, ulcerated, clearly malignant mass that stradles the GE junction. The vast bulk of the mass lays in the stomach.    05/07/2014 -  Chemotherapy mFOLFOX, with neulasta as needed, bolus 5FU omitted from cycle 6 due to cytopenia     06/03/2014 Miscellaneous [employer] genetic test (+) for:  NF1 splice site [id number] del 47 TP53 R213*    CURRENT THERAPY: mFOLFOX6 started on 05/08/2015, 5% dose reduction and no 60f bolus due to thrombocytopenia since cycle 6   HISTORY OF PRESENTING ILLNESS:  JMayjor Ager39y.o. 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 I saw him first when [facility] He is here for the first office visit after hospital discharge.  He was  admitted on 12/15 with progressive, 4 month history of dysphagia, epigastric abdominal discomfort and bloating, early satiation accompanied by nausea, and postprandial vomiting and diarrhea. He has experienced a 30 lbs weight loss in the process. He has noted increased abdominal girth. Patient denies any shortness of breath, chest pain, GI bleed, hematuria or lower extremity swelling. He denies heavy alcohol intake. Denies tobacco abuse. Denies risk  factors for HIV or hepatitis. He denies any family history of GI malignancies.  CT of the abdomen and pelvis with contrast on 12/15 revealed several pulmonary nodules at the bases bilaterally, profound thickening of the distal esophagus extending beyond the gastroesophageal junction into the proximal stomach,cardia and fundus of the stomach, most evident along the lesser curvature. No pathologic dilatation of small bowel or colon. Extensive upper abdominal and retroperitoneal lymphadenopathy was visualized. Moderate volume of ascites, presumably malignant, was noted. Extensive soft tissue nodularity throughout the omentum, compatible with omental caking and widespread peritoneal metastasis. Additionally, there are other areas of enhancing nodularity along the peritoneal surface. Tiny ventral and umbilical hernias containing predominantly omental fat, including some omental implants. There are no aggressive appearing lytic or blastic lesions noted in the visualized portions of the skeleton.  He underwent an EGD by Dr. JArdis Hughson 04/25/2014, which showed a large fungating mass that straddles the GE junction and occupying the upper 1/3 part of the stomach. Multiple biopsy was taken which showed adenocarcinoma with signet ring features. He also underwent a paracentesis with 4.5 L fluid is removed on 04/26/2014. Cytology was positive for adenocarcinoma with signet ring features, consistent with metastatic disease.  He was discharged to home on 04/27/2014. He did feel a slightly informant of his abdominal bloating after the paracentesis, but not much improvement other symptoms. He had Mediport placement and the second paracentesis was again 4 L fluids removal on 05/02/2014. His appetite is low, he drinks ensure 3 bottles a day, some soup and a very little other foods. His energy level is also low, but able to take care of  all his daily needs, and move around with about much difficulty. His abdominal  discomfort/pain are relatively controlled by morphine 15 mg at night and tramadol 2-3 times during the day. He denies any fever, chills, cough, chest pain or any in the other discomfort.  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 He was found to have some skin erythema at the port site where he came back for poor disconnection last time, wound culture was stopped on and was negative. She received a course of antibiotics. The skin erythema resolved and today, and that he did not have fever, chills or other symptoms. He continues to do very well, no new complaints since I saw him last time. He denies any significant pain, nausea, abdominal discomfort or other symptoms. He has good energy level and appetite.  MEDICAL HISTORY:  Past Medical History  Diagnosis Date  . Medical history non-contributory   . gastric ca dx'd 04/2014    SURGICAL HISTORY: Past Surgical History  Procedure Laterality Date  . Squamous cell carcinoma excision  approx March 2015    removed from top of head  . Esophagogastroduodenoscopy (egd) with propofol N/A 04/25/2014    Procedure: ESOPHAGOGASTRODUODENOSCOPY (EGD) WITH PROPOFOL;  Surgeon: Milus Banister, MD;  Location: [facility];  Service: Endoscopy;  Laterality: N/A;    SOCIAL HISTORY: Social History   Social History  . Marital Status: Single    Spouse Name: N/A  . Number of Children: N/A  . Years of Education: N/A   Occupational History  . Not on file.   Social History Main Topics  . Smoking status: Never Smoker   . Smokeless tobacco: Never Used  . Alcohol Use: No  . Drug Use: No  . Sexual Activity: Not on file   Other Topics Concern  . Not on file   Social History Narrative    FAMILY HISTORY: Family History  Problem Relation Age of Onset  . Breast cancer Sister 80   . Hypertension Mother   . Hypertension Father   Maternal  aunt breast cancer at age of 20 Maternal cousin ovarian cancer at age of 55 Maternal ancle prostate cancer age of 50   ALLERGIES:  has No Known Allergies.  MEDICATIONS:  Current Outpatient Prescriptions  Medication Sig Dispense Refill  . docusate sodium 100 MG CAPS Take 100 mg by mouth 2 (two) times daily. 10 capsule 0  . lidocaine-prilocaine (EMLA) cream Apply 1 application topically as needed. 30 g 0  . mirtazapine (REMERON) 15 MG tablet Take 1 tablet (15 mg total) by mouth at bedtime. 30 tablet 1  . ondansetron (ZOFRAN) 4 MG tablet Take 1 tablet (4 mg total) by mouth every 8 (eight) hours as needed for nausea or vomiting. 20 tablet 0  . pantoprazole (PROTONIX) 40 MG tablet Take 1 tablet (40 mg total) by mouth daily. 30 tablet 0  . prochlorperazine (COMPAZINE) 10 MG tablet Take 1 tablet (10 mg total) by mouth every 6 (six) hours as needed for nausea or vomiting. 30 tablet 3  . traMADol (ULTRAM) 50 MG tablet Take 1-2 tablets (50-100 mg total) by mouth every 6 (six) hours as needed for moderate pain. 30 tablet 0  . morphine (MS CONTIN) 15 MG 12 hr tablet Take 1 tablet (15 mg total) by mouth every 12 (twelve) hours. (Patient not taking: Reported on 12/31/2014) 60 tablet 0   No current facility-administered medications  for this visit.   Facility-Administered Medications Ordered in Other Visits  Medication Dose Route Frequency Provider Last Rate Last Dose  . sodium chloride 0.9 % injection 10 mL  10 mL Intravenous PRN Truitt Merle, MD   10 mL at 12/31/14 0900  . sodium chloride 0.9 % injection 10 mL  10 mL Intravenous PRN Truitt Merle, MD   10 mL at 01/14/15 0841    REVIEW OF SYSTEMS:   Constitutional: Denies fevers, chills or abnormal night sweats, no recent weight loss Eyes: Denies blurriness of vision, double vision or watery eyes Ears, nose, mouth, throat, and face: Denies mucositis or sore throat Respiratory: Denies cough, dyspnea or wheezes Cardiovascular: Denies palpitation, chest  discomfort or lower extremity swelling Gastrointestinal:  Less nausea, abdominal bloating and discomfort Skin: Denies abnormal skin rashes Lymphatics: Denies new lymphadenopathy or easy bruising Neurological:Denies numbness, tingling or new weaknesses Behavioral/Psych: Mood is stable, no new changes  All other systems were reviewed with the patient and are negative except those mentioned in the history.  PHYSICAL EXAMINATION: ECOG PERFORMANCE STATUS: 1 GENERAL:alert, no distress and comfortable SKIN: skin color, texture, turgor are normal, no rashes or significant lesions EYES: normal, conjunctiva are pink and non-injected, sclera clear OROPHARYNX:no exudate, no erythema and lips, buccal mucosa, and tongue normal  NECK: supple, thyroid normal size, non-tender, without nodularity LYMPH:  no palpable lymphadenopathy in the cervical, axillary or inguinal LUNGS: clear to auscultation and percussion with normal breathing effort HEART: regular rate & rhythm and no murmurs and no lower extremity edema ABDOMEN:abdomen soft, non-tender and normal bowel sounds, probable small amount of ascites. Musculoskeletal:no cyanosis of digits and no clubbing  PSYCH: alert & oriented x 3 with fluent speech NEURO: no focal motor/sensory deficits  LABORATORY DATA:  I have reviewed the data as listed CBC Latest Ref Rng 01/14/2015 12/31/2014 12/17/2014  WBC 4.0 - 10.3 10e3/uL 4.3 3.9(L) 4.1  Hemoglobin 13.0 - 17.1 g/dL 12.6(L) 12.3(L) 12.6(L)  Hematocrit 38.4 - 49.9 % 37.7(L) 37.0(L) 38.0(L)  Platelets 140 - 400 10e3/uL 123(L) 128(L) 140      CMP Latest Ref Rng 01/14/2015 12/31/2014 12/17/2014  Glucose 70 - 140 mg/dl 97 95 96  BUN 7.0 - 26.0 mg/dL 17.2 14.3 15.7  Creatinine 0.7 - 1.3 mg/dL 0.8 0.8 0.8  Sodium 136 - 145 mEq/L 140 141 140  Potassium 3.5 - 5.1 mEq/L 4.3 4.0 4.4  Chloride 96 - 112 mEq/L - - -  CO2 22 - 29 mEq/L 27 26 27   Calcium 8.4 - 10.4 mg/dL 8.9 8.8 8.7  Total Protein 6.4 - 8.3 g/dL 6.8 6.5  6.7  Total Bilirubin 0.20 - 1.20 mg/dL 0.53 0.51 0.64  Alkaline Phos 40 - 150 U/L 140 143 136  AST 5 - 34 U/L 31 26 25   ALT 0 - 55 U/L 20 18 15    CEA  Status: Finalresult Visible to patient:  MyChart Nextappt: 01/16/2015 at 12:00 PM in Oncology [facility] FLUSH NURSE 2) Dx:  Gastric adenocarcinoma: Stage IV           Ref Range 4wk ago  41moago  2100mogo     CEA 0.0 - 5.0 ng/mL 1.0 1.8 1.7          Wound (port site) culture: no growth 01/02/2015  Pathology report  Diagnosis  PERITONEAL/ASCITIC FLUID(SPECIMEN 1 OF 1 COLLECTED 04/26/14): METASTATIC ADENOCARCINOMA WITH SIGNET RING CELL FEATURES.  Stomach, biopsy, r/o cancer 04/25/14 - ADENOCARCINOMA WITH SIGNET RING FEATURES. Microscopic Comment The results were called to  Dr. Ardis Hughs on 04/26/2014. (JDP:kh 04/26/14) Claudette Laws MD Pathologist, Electronic Signature (Case signed 04/26/2014)  HER-2/NEU BY CISH - NO AMPLIFICATION OF HER-2 DETECTED. RESULT RATIO OF HER2: CEP 17 SIGNALS 1.20 AVERAGE HER2 COPY NUMBER PER CELL 2.10  ADDITIONAL INFORMATION: Mismatch Repair (MMR) Protein Immunohistochemistry (IHC) IHC Expression Result: MLH1: Preserved nuclear expression (greater 50% tumor expression) MSH2: Preserved nuclear expression (greater 50% tumor expression) MSH6: Preserved nuclear expression (greater 50% tumor expression) PMS2: Preserved nuclear expression (greater 50% tumor expression) * Internal control demonstrates intact nuclear expression Interpretation: NORMAL There is preserved expression of the major and minor MMR proteins. There is a very low probability that microsatellite instability (MSI) is present. However, certain clinically significant MMR protein mutations may result in preservation of nuclear expression. It is recommended that the preservation of protein expression be correlated with molecular based MSI testing.  [employer] testing: (+) HX50 and NF1   RADIOGRAPHIC  STUDIES: I have personally reviewed the radiological images as listed and agreed with the findings in the report.  Ct Chest, abdomen and pelvis Wo Contrast12/24/2015    IMPRESSION:  1. Findings, as above, highly concerning for either esophageal or gastric primary malignancy, with metastatic lymphadenopathy in the upper abdomen and retroperitoneum, widespread intraperitoneal spread of disease, malignant ascites, and multiple pulmonary nodules in the visualize lung bases concerning for pulmonary metastasis. Oncologic evaluation is recommended.  2. Additional incidental findings, as above. These results were called by telephone at the time of interpretation on 04/23/2014 at 7:53 pm to Mize , who verbally acknowledged these results.   Electronically Signed   By: Vinnie Langton M.D.   On: 04/23/2014 19:56   CT CAP 06/24/2014 IMPRESSION: 1. Progressive low density and calcifications within the extensive upper abdominal lymphadenopathy, likely related to partial treatment. Some of the lymph nodes do appear slightly larger. The irregular infiltrating mass involving the gastric cardia appears slightly improved. 2. No evidence of hepatic metastatic disease. 3. Overall volume of ascites has improved, although extensive changes of peritoneal carcinomatosis have not significantly changed. 4. No significant change in bilateral pulmonary nodules consistent with metastatic disease. 5. Resolution of bilateral pleural effusions.  CT chest, abdomen and pelvis with contrast on 08/26/2014 IMPRESSION: Overall stable to slight progression of disease. Existing pulmonary nodules are stable to slightly progressed with no new pulmonary nodules evident. The bulky upper abdominal lymphadenopathy is stable to minimally progressed while no substantial change in the primary infiltrating gastric neoplasm is discernible. Omental disease appears stable. There is slightly more ascites on today's study.  CT  chest, abdomen and pelvis with contrast on 10/21/2014 IMPRESSION: 1. Compared to the prior examination there appears to be slight progression of disease predominantly manifest by worsening gastric mucosal thickening, particularly along the greater curvature of the stomach in the region of the cardia and fundus, with increasing adjacent mesenteric/omental disease and slight increase in malignant ascites. Previously described upper abdominal lymphadenopathy is generally stable to slightly decreased compared to the prior examination, and previously noted pulmonary nodules are unchanged. 2. Additional incidental findings, as above.  CT chest, abdomen and pelvis with contrast, 12/11/2014 at [facility] Impression: #1 not much interval change into gastroesophageal junction mass next #2 number change in the lymph node, lung and peritoneal metastatic disease #3 small amount of intraperitoneal flus is slightly increased.   EGD WITH BIOPSY 04/25/2014 ENDOSCOPIC IMPRESSION: There was a large, nearly circumferential, ulcerated, clearly malignant mass that stradles the GE junction. The vast bulk of the mass lays in  the stomach, occupying about 1/3 of the stomach. The mass extends into the esophagus for about 4cm above the GE junction. Mutiple biopsies were taken from the gastric portion of the mass   I ASSESSMENT & PLAN:  39 year old gentleman without significant past medical history presents with abdominal bloating, nausea, vomiting, diarrhea, anemia and weight loss. EGD showed a large mass arising from the gastric fundus and extending into GE junction, biopsy showed adenocarcinoma with signet ring features, CT scan is consistent with peritoneal carcinomatosis and bilateral lung metastasis. Ascites cytology was positive for malignant cells.  1. 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 HER-2 negative, MMR-normal -I discussed his surgical  biopsy results, cytology results, skin findings with patient and his family members, including his parents and 2 sisters. Unfortunately, this is widely metastatic, not curable, and overall prognosis is very poor. Literature has showed gastric adenocarcinoma with signet ring features has overall worse outcome comparing to others. His life expectancy is probably less than 6 months without treatment. -He has started first-line chemotherapy FOLFOX, tolerated well, and his symptoms have much  improved. -His tumor is negative for HER-2 overexpression, no role for trastuzumab. -We also checked his tumor for MMR, which was normal so he would unlikely benefit from immunotherapy.,  -[facility] genomic test reviewed no actionable mutation -His first restaging CT scan showed mild partial response, overall stable disease. His ascites has significantly decreased. Pleural effusion resolved. He is clinically doing well.  -I discussed his third restaging CT findings with patient and his father. It seems he had a mixed response, slight increase of the gastric wall thickness and the ascites, abdominal lymph nodes are smaller, overall I consider as stable disease or slight disease progression.  -His tumor marker CEA has came down to normal. He is clinically doing well, I recommended continuing FOLFOX. However, -due to his family history of malignancy, he has had genetic testing at [facility]. It was negative for the comprehensive cancer panel, which included APC, BRCA 1/2, CDE H1, MLH1, MSH2, MSH6, PMS2, etc. -He was seen at [facility] in 12/2014, clinical trial options were exported, he underwent further genetic testing, and the treatment recommendation was to continue current chemotherapy giving the stable disease. -Lab are reviewed, mild thrombocytopenia, adequate for treatment, we'll proceed cycle 19 chemotherapy today  2. Malignant ascites -s/p paracentesis at the beginning of his  diagnosis -Slightly increased on recent CT CT scan, we'll monitor closely.  3. Port skin infection after cycle 18 chemo  -resolved   4. Anemia, multifactorial (iron deficient anemia, cancer related and chemotherapy induced ) -His ferritin level was normal at 171, but he has low iron and saturation, likely some degree of iron deficient anemia. -He has received IV Feraheme 510 mg twice, 2 weeks apart -Blood transfusion as needed during chemotherapy if her hemoglobin below 8. -Mild anemia has been stable.   Plan #1 cycle 19 FOLFOX today,  Same dose as before, no Neulasta on day 3 with this cycle  #2 RTC in 2 weeks for cycle 20, and I will see him before cycle 20 #3 repeat CT scan after cycle 22, or sooner if he wants to follow up with MD Ouida Sills   The patient knows to call the clinic with any problems, questions or concerns.  I spent 20 minutes counseling the patient face to face. The total time spent in the appointment was 25  minutes and more than 50% was on counseling.  Truitt Merle, MD 01/14/2015

## 2015-01-14 NOTE — Patient Instructions (Signed)
[facility] Discharge Instructions for Patients Receiving Chemotherapy  Today you received the following chemotherapy agents FOLFOX.   To help prevent nausea and vomiting after your treatment, we encourage you to take your nausea medication as directed.    If you develop nausea and vomiting that is not controlled by your nausea medication, call the clinic.   BELOW ARE SYMPTOMS THAT SHOULD BE REPORTED IMMEDIATELY:  *FEVER GREATER THAN 100.5 F  *CHILLS WITH OR WITHOUT FEVER  NAUSEA AND VOMITING THAT IS NOT CONTROLLED WITH YOUR NAUSEA MEDICATION  *UNUSUAL SHORTNESS OF BREATH  *UNUSUAL BRUISING OR BLEEDING  TENDERNESS IN MOUTH AND THROAT WITH OR WITHOUT PRESENCE OF ULCERS  *URINARY PROBLEMS  *BOWEL PROBLEMS  UNUSUAL RASH Items with * indicate a potential emergency and should be followed up as soon as possible.  Feel free to call the clinic you have any questions or concerns. The clinic phone number is [phone].  Please show the CHEMO ALERT CARD at check-in to the Emergency Department and triage nurse.   

## 2015-01-14 NOTE — Patient Instructions (Signed)

## 2015-01-15 LAB — CEA: CEA: 1.8 ng/mL (ref 0.0–5.0)

## 2015-01-16 ENCOUNTER — Ambulatory Visit (HOSPITAL_BASED_OUTPATIENT_CLINIC_OR_DEPARTMENT_OTHER): Payer: BLUE CROSS/BLUE SHIELD

## 2015-01-16 VITALS — BP 133/70 | HR 85 | Temp 97.5°F | Resp 20

## 2015-01-16 DIAGNOSIS — Z452 Encounter for adjustment and management of vascular access device: Secondary | ICD-10-CM | POA: Diagnosis not present

## 2015-01-16 DIAGNOSIS — C169 Malignant neoplasm of stomach, unspecified: Secondary | ICD-10-CM

## 2015-01-16 MED ORDER — SODIUM CHLORIDE 0.9 % IJ SOLN
10.0000 mL | INTRAMUSCULAR | Status: DC | PRN
  Administered 2015-01-16: 10 mL
  Filled 2015-01-16: qty 10

## 2015-01-16 MED ORDER — HEPARIN SOD (PORK) LOCK FLUSH 100 UNIT/ML IV SOLN
500.0000 [IU] | Freq: Once | INTRAVENOUS | Status: AC | PRN
Start: 2015-01-16 — End: 2015-01-16
  Administered 2015-01-16: 500 [IU]
  Filled 2015-01-16: qty 5

## 2015-01-28 ENCOUNTER — Ambulatory Visit: Payer: BLUE CROSS/BLUE SHIELD

## 2015-01-28 ENCOUNTER — Telehealth: Payer: Self-pay | Admitting: Hematology

## 2015-01-28 ENCOUNTER — Ambulatory Visit (HOSPITAL_BASED_OUTPATIENT_CLINIC_OR_DEPARTMENT_OTHER): Payer: BLUE CROSS/BLUE SHIELD | Admitting: Hematology

## 2015-01-28 ENCOUNTER — Encounter: Payer: Self-pay | Admitting: *Deleted

## 2015-01-28 ENCOUNTER — Encounter: Payer: Self-pay | Admitting: Hematology

## 2015-01-28 ENCOUNTER — Other Ambulatory Visit (HOSPITAL_BASED_OUTPATIENT_CLINIC_OR_DEPARTMENT_OTHER): Payer: BLUE CROSS/BLUE SHIELD

## 2015-01-28 ENCOUNTER — Ambulatory Visit (HOSPITAL_BASED_OUTPATIENT_CLINIC_OR_DEPARTMENT_OTHER): Payer: BLUE CROSS/BLUE SHIELD

## 2015-01-28 VITALS — BP 146/87 | HR 72 | Temp 98.1°F | Resp 20 | Ht 69.0 in | Wt 290.2 lb

## 2015-01-28 DIAGNOSIS — D630 Anemia in neoplastic disease: Secondary | ICD-10-CM

## 2015-01-28 DIAGNOSIS — C786 Secondary malignant neoplasm of retroperitoneum and peritoneum: Secondary | ICD-10-CM

## 2015-01-28 DIAGNOSIS — D509 Iron deficiency anemia, unspecified: Secondary | ICD-10-CM | POA: Diagnosis not present

## 2015-01-28 DIAGNOSIS — C7802 Secondary malignant neoplasm of left lung: Secondary | ICD-10-CM | POA: Diagnosis not present

## 2015-01-28 DIAGNOSIS — Z5111 Encounter for antineoplastic chemotherapy: Secondary | ICD-10-CM

## 2015-01-28 DIAGNOSIS — C169 Malignant neoplasm of stomach, unspecified: Secondary | ICD-10-CM | POA: Diagnosis not present

## 2015-01-28 DIAGNOSIS — C7801 Secondary malignant neoplasm of right lung: Secondary | ICD-10-CM

## 2015-01-28 DIAGNOSIS — Z95828 Presence of other vascular implants and grafts: Secondary | ICD-10-CM

## 2015-01-28 LAB — CBC WITH DIFFERENTIAL/PLATELET
BASO%: 0.3 % (ref 0.0–2.0)
Basophils Absolute: 0 10*3/uL (ref 0.0–0.1)
EOS%: 1.3 % (ref 0.0–7.0)
Eosinophils Absolute: 0.1 10*3/uL (ref 0.0–0.5)
HEMATOCRIT: 36.2 % — AB (ref 38.4–49.9)
HEMOGLOBIN: 12.1 g/dL — AB (ref 13.0–17.1)
LYMPH#: 1.2 10*3/uL (ref 0.9–3.3)
LYMPH%: 30.5 % (ref 14.0–49.0)
MCH: 30 pg (ref 27.2–33.4)
MCHC: 33.4 g/dL (ref 32.0–36.0)
MCV: 89.8 fL (ref 79.3–98.0)
MONO#: 0.7 10*3/uL (ref 0.1–0.9)
MONO%: 18 % — ABNORMAL HIGH (ref 0.0–14.0)
NEUT#: 1.9 10*3/uL (ref 1.5–6.5)
NEUT%: 49.9 % (ref 39.0–75.0)
PLATELETS: 125 10*3/uL — AB (ref 140–400)
RBC: 4.03 10*6/uL — ABNORMAL LOW (ref 4.20–5.82)
RDW: 16 % — AB (ref 11.0–14.6)
WBC: 3.8 10*3/uL — ABNORMAL LOW (ref 4.0–10.3)

## 2015-01-28 LAB — COMPREHENSIVE METABOLIC PANEL (CC13)
ALBUMIN: 2.8 g/dL — AB (ref 3.5–5.0)
ALK PHOS: 143 U/L (ref 40–150)
ALT: 17 U/L (ref 0–55)
AST: 28 U/L (ref 5–34)
Anion Gap: 7 mEq/L (ref 3–11)
BILIRUBIN TOTAL: 0.79 mg/dL (ref 0.20–1.20)
BUN: 20.2 mg/dL (ref 7.0–26.0)
CALCIUM: 8.4 mg/dL (ref 8.4–10.4)
CO2: 27 mEq/L (ref 22–29)
CREATININE: 0.7 mg/dL (ref 0.7–1.3)
Chloride: 108 mEq/L (ref 98–109)
EGFR: >90 mL/min/{1.73_m2} (ref >90)
Glucose: 121 mg/dl (ref 70–140)
Potassium: 3.5 mEq/L (ref 3.5–5.1)
Sodium: 141 mEq/L (ref 136–145)
Total Protein: 6.6 g/dL (ref 6.4–8.3)

## 2015-01-28 MED ORDER — DEXTROSE 5 % IV SOLN
Freq: Once | INTRAVENOUS | Status: AC
Start: 2015-01-28 — End: 2015-01-28
  Administered 2015-01-28: 10:00:00 via INTRAVENOUS

## 2015-01-28 MED ORDER — SODIUM CHLORIDE 0.9 % IJ SOLN
10.0000 mL | INTRAMUSCULAR | Status: DC | PRN
  Filled 2015-01-28: qty 10

## 2015-01-28 MED ORDER — PROCHLORPERAZINE MALEATE 10 MG PO TABS
10.0000 mg | ORAL_TABLET | Freq: Once | ORAL | Status: AC
  Administered 2015-01-28: 10 mg via ORAL

## 2015-01-28 MED ORDER — SODIUM CHLORIDE 0.9 % IV SOLN
2280.0000 mg/m2 | INTRAVENOUS | Status: DC
  Administered 2015-01-28: 5700 mg via INTRAVENOUS
  Filled 2015-01-28: qty 114

## 2015-01-28 MED ORDER — SODIUM CHLORIDE 0.9 % IJ SOLN
10.0000 mL | INTRAMUSCULAR | Status: DC | PRN
  Administered 2015-01-28: 10 mL via INTRAVENOUS
  Filled 2015-01-28: qty 10

## 2015-01-28 MED ORDER — LIDOCAINE-PRILOCAINE 2.5-2.5 % EX CREA: 1.0000 "application " | TOPICAL_CREAM | CUTANEOUS | Status: AC | PRN

## 2015-01-28 MED ORDER — LEUCOVORIN CALCIUM INJECTION 350 MG
400.0000 mg/m2 | Freq: Once | INTRAVENOUS | Status: AC
  Administered 2015-01-28: 996 mg via INTRAVENOUS
  Filled 2015-01-28: qty 49.8

## 2015-01-28 MED ORDER — OXALIPLATIN CHEMO INJECTION 100 MG/20ML
80.0000 mg/m2 | Freq: Once | INTRAVENOUS | Status: AC
  Administered 2015-01-28: 200 mg via INTRAVENOUS
  Filled 2015-01-28: qty 40

## 2015-01-28 MED ORDER — DEXAMETHASONE SODIUM PHOSPHATE 100 MG/10ML IJ SOLN
Freq: Once | INTRAMUSCULAR | Status: AC
  Administered 2015-01-28: 10:00:00 via INTRAVENOUS
  Filled 2015-01-28: qty 4

## 2015-01-28 MED ORDER — INFLUENZA VAC SPLIT QUAD 0.5 ML IM SUSY
0.5000 mL | PREFILLED_SYRINGE | INTRAMUSCULAR | Status: DC
  Filled 2015-01-28: qty 0.5

## 2015-01-28 MED ORDER — PROCHLORPERAZINE MALEATE 10 MG PO TABS
ORAL_TABLET | ORAL | Status: AC
  Filled 2015-01-28: qty 1

## 2015-01-28 MED ORDER — HEPARIN SOD (PORK) LOCK FLUSH 100 UNIT/ML IV SOLN
500.0000 [IU] | Freq: Once | INTRAVENOUS | Status: DC | PRN
  Filled 2015-01-28: qty 5

## 2015-01-28 NOTE — Telephone Encounter (Signed)
Per 9/20 pof cx 2 week f/u pt will be seen for f/u in 4 weeks. Lab/chemo appointments remain the same. Patient already scheduled for lab/fu/chemo in 4 weeks. Left message for patient re change to 10/4 appointments and new time for 9 am. Other appointments remain the same.

## 2015-01-28 NOTE — Progress Notes (Signed)
Oncology Nurse Navigator Documentation  Oncology Nurse Navigator Flowsheets 01/28/2015  Navigator Encounter Type Initial MedOnc  Patient Visit Type Medonc  Treatment Phase Treatment  Barriers/Navigation Needs No barriers at this time  Interventions None required  Support Groups/Services GI;Living with cancer  Time Spent with Patient 15  Met with Raciel and his mother during treatment today. Introduced patient to GI Nurse Navigator role and provided contact information. Has been able to work part-time and keep his insurance. Lives alone w/61 year old rescue dog. Feeling well. Provided information on support groups.

## 2015-01-28 NOTE — Addendum Note (Signed)
Addended by: Truitt Merle on: 01/28/2015 10:44 AM   Modules accepted: Orders

## 2015-01-28 NOTE — Progress Notes (Signed)
[place]  [phone] [phone]  Clinic follow-up Note   Patient Care Team: No Pcp Per Patient as PCP - General (General Practice) 01/28/2015  CHIEF COMPLAINTS Follow up metastatic gastric cancer    Gastric adenocarcinoma: Stage IV   04/23/2014 Imaging CT abdomen showed peritoneal carcinomatosis, ascites, thickning of gastric and GEJ wall, CT chest showed a few small b/l lung nodules.    04/24/2014 Tumor Marker CEA  5.6, CA19.9 13   04/25/2014 Initial Diagnosis Gastric adenocarcinoma: Stage IV   04/25/2014 Pathology Results Stomach mass biopsy showed - ADENOCARCINOMA WITH SIGNET RING FEATURES. ascites cytology (+)   04/25/2014 Procedure EGD: There was a large, nearly circumferential, ulcerated, clearly malignant mass that stradles the GE junction. The vast bulk of the mass lays in the stomach.    05/07/2014 -  Chemotherapy mFOLFOX, with neulasta as needed, bolus 5FU omitted from cycle 6 due to cytopenia     06/03/2014 Miscellaneous [employer] [facility] genetic test (+) for:  NF1 splice site [id number] del 47 TP53 R213*    CURRENT THERAPY: mFOLFOX6 started on 05/08/2015, 5% dose reduction and no 16f bolus due to thrombocytopenia since cycle 6   HISTORY OF PRESENTING ILLNESS:  JMikle Sternberg39y.o. 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 I saw him first when he was recently admitted to [facility] He is here for the first office visit after hospital discharge.  He was  admitted on 12/15 with progressive, 4 month history of dysphagia, epigastric abdominal discomfort and bloating, early satiation accompanied by nausea, and postprandial vomiting and diarrhea. He has experienced a 30 lbs weight loss in the process. He has noted increased abdominal girth. Patient denies any shortness of breath, chest pain, GI bleed, hematuria or lower extremity swelling. He denies heavy alcohol intake. Denies tobacco abuse. Denies risk  factors for HIV or hepatitis. He denies any family history of GI malignancies.  CT of the abdomen and pelvis with contrast on 12/15 revealed several pulmonary nodules at the bases bilaterally, profound thickening of the distal esophagus extending beyond the gastroesophageal junction into the proximal stomach,cardia and fundus of the stomach, most evident along the lesser curvature. No pathologic dilatation of small bowel or colon. Extensive upper abdominal and retroperitoneal lymphadenopathy was visualized. Moderate volume of ascites, presumably malignant, was noted. Extensive soft tissue nodularity throughout the omentum, compatible with omental caking and widespread peritoneal metastasis. Additionally, there are other areas of enhancing nodularity along the peritoneal surface. Tiny ventral and umbilical hernias containing predominantly omental fat, including some omental implants. There are no aggressive appearing lytic or blastic lesions noted in the visualized portions of the skeleton.  He underwent an EGD by Dr. JArdis Hughson 04/25/2014, which showed a large fungating mass that straddles the GE junction and occupying the upper 1/3 part of the stomach. Multiple biopsy was taken which showed adenocarcinoma with signet ring features. He also underwent a paracentesis with 4.5 L fluid is removed on 04/26/2014. Cytology was positive for adenocarcinoma with signet ring features, consistent with metastatic disease.  He was discharged to home on 04/27/2014. He did feel a slightly informant of his abdominal bloating after the paracentesis, but not much improvement other symptoms. He had Mediport placement and the second paracentesis was again 4 L fluids removal on 05/02/2014. His appetite is low, he drinks ensure 3 bottles a day, some soup and a very little other foods. His energy level is also low, but able to take care of all  his daily needs, and move around with about much difficulty. His abdominal  discomfort/pain are relatively controlled by morphine 15 mg at night and tramadol 2-3 times during the day. He denies any fever, chills, cough, chest pain or any in the other discomfort.  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 He is doing well overall. The skin redness at the port site resolved quickly last time. No fever or chills. No other new complaints. He has good appetite and eating well. His weight is stable. He is accompanied to the clinic by his parents today.  MEDICAL HISTORY:  Past Medical History  Diagnosis Date  . Medical history non-contributory   . gastric ca dx'd 04/2014    SURGICAL HISTORY: Past Surgical History  Procedure Laterality Date  . Squamous cell carcinoma excision  approx March 2015    removed from top of head  . Esophagogastroduodenoscopy (egd) with propofol N/A 04/25/2014    Procedure: ESOPHAGOGASTRODUODENOSCOPY (EGD) WITH PROPOFOL;  Surgeon: Milus Banister, MD;  Location: [facility] END[facility]COPY;  Service: Endoscopy;  Laterality: N/A;    SOCIAL HISTORY: Social History   Social History  . Marital Status: Single    Spouse Name: N/A  . Number of Children: N/A  . Years of Education: N/A   Occupational History  . Not on file.   Social History Main Topics  . Smoking status: Never Smoker   . Smokeless tobacco: Never Used  . Alcohol Use: No  . Drug Use: No  . Sexual Activity: Not on file   Other Topics Concern  . Not on file   Social History Narrative    FAMILY HISTORY: Family History  Problem Relation Age of Onset  . Breast cancer Sister 82   . Hypertension Mother   . Hypertension Father   Maternal aunt breast cancer at age of 41 Maternal cousin ovarian cancer at age of 71 Maternal ancle prostate cancer age of 36   ALLERGIES:  has No Known Allergies.  MEDICATIONS:  Current Outpatient Prescriptions  Medication Sig Dispense  Refill  . docusate sodium 100 MG CAPS Take 100 mg by mouth 2 (two) times daily. 10 capsule 0  . lidocaine-prilocaine (EMLA) cream Apply 1 application topically as needed. 30 g 2  . mirtazapine (REMERON) 15 MG tablet Take 1 tablet (15 mg total) by mouth at bedtime. 30 tablet 1  . morphine (MS CONTIN) 15 MG 12 hr tablet Take 1 tablet (15 mg total) by mouth every 12 (twelve) hours. 60 tablet 0  . ondansetron (ZOFRAN) 4 MG tablet Take 1 tablet (4 mg total) by mouth every 8 (eight) hours as needed for nausea or vomiting. 20 tablet 0  . pantoprazole (PROTONIX) 40 MG tablet Take 1 tablet (40 mg total) by mouth daily. 30 tablet 0  . prochlorperazine (COMPAZINE) 10 MG tablet Take 1 tablet (10 mg total) by mouth every 6 (six) hours as needed for nausea or vomiting. 30 tablet 3  . traMADol (ULTRAM) 50 MG tablet Take 1-2 tablets (50-100 mg total) by mouth every 6 (six) hours as needed for moderate pain. 30 tablet 0   No current facility-administered medications for this visit.   Facility-Administered Medications Ordered in Other Visits  Medication Dose Route Frequency Provider Last Rate Last Dose  . sodium chloride 0.9 % injection 10 mL  10 mL Intravenous PRN Truitt Merle, MD   10 mL at 12/31/14 0900  .  sodium chloride 0.9 % injection 10 mL  10 mL Intravenous PRN Truitt Merle, MD   10 mL at 01/28/15 0846    REVIEW OF SYSTEMS:   Constitutional: Denies fevers, chills or abnormal night sweats, no recent weight loss Eyes: Denies blurriness of vision, double vision or watery eyes Ears, nose, mouth, throat, and face: Denies mucositis or sore throat Respiratory: Denies cough, dyspnea or wheezes Cardiovascular: Denies palpitation, chest discomfort or lower extremity swelling Gastrointestinal:  Less nausea, abdominal bloating and discomfort Skin: Denies abnormal skin rashes Lymphatics: Denies new lymphadenopathy or easy bruising Neurological:Denies numbness, tingling or new weaknesses Behavioral/Psych: Mood is  stable, no new changes  All other systems were reviewed with the patient and are negative except those mentioned in the history.  PHYSICAL EXAMINATION: ECOG PERFORMANCE STATUS: 1 GENERAL:alert, no distress and comfortable SKIN: skin color, texture, turgor are normal, no rashes or significant lesions EYES: normal, conjunctiva are pink and non-injected, sclera clear OROPHARYNX:no exudate, no erythema and lips, buccal mucosa, and tongue normal  NECK: supple, thyroid normal size, non-tender, without nodularity LYMPH:  no palpable lymphadenopathy in the cervical, axillary or inguinal LUNGS: clear to auscultation and percussion with normal breathing effort HEART: regular rate & rhythm and no murmurs and no lower extremity edema ABDOMEN:abdomen soft, non-tender and normal bowel sounds, probable small amount of ascites. Musculoskeletal:no cyanosis of digits and no clubbing  PSYCH: alert & oriented x 3 with fluent speech NEURO: no focal motor/sensory deficits  LABORATORY DATA:  I have reviewed the data as listed CBC Latest Ref Rng 01/28/2015 01/14/2015 12/31/2014  WBC 4.0 - 10.3 10e3/uL 3.8(L) 4.3 3.9(L)  Hemoglobin 13.0 - 17.1 g/dL 12.1(L) 12.6(L) 12.3(L)  Hematocrit 38.4 - 49.9 % 36.2(L) 37.7(L) 37.0(L)  Platelets 140 - 400 10e3/uL 125(L) 123(L) 128(L)      CMP Latest Ref Rng 01/28/2015 01/14/2015 12/31/2014  Glucose 70 - 140 mg/dl 121 97 95  BUN 7.0 - 26.0 mg/dL 20.2 17.2 14.3  Creatinine 0.7 - 1.3 mg/dL 0.7 0.8 0.8  Sodium 136 - 145 mEq/L 141 140 141  Potassium 3.5 - 5.1 mEq/L 3.5 4.3 4.0  Chloride 96 - 112 mEq/L - - -  CO2 22 - 29 mEq/L 27 27 26   Calcium 8.4 - 10.4 mg/dL 8.4 8.9 8.8  Total Protein 6.4 - 8.3 g/dL 6.6 6.8 6.5  Total Bilirubin 0.20 - 1.20 mg/dL 0.79 0.53 0.51  Alkaline Phos 40 - 150 U/L 143 140 143  AST 5 - 34 U/L 28 31 26   ALT 0 - 55 U/L 17 20 18    CEA  Status: Finalresult Visible to patient:  MyChart Nextappt: Today at 08:30 AM in [facility]  2) Dx:  Gastric adenocarcinoma: Stage IV           Ref Range 2wk ago (01/14/15) 43moago (12/17/14) 160mogo (12/03/14) 23m19moo (11/05/14)    CEA 0.0 - 5.0 ng/mL 1.8 1.0 1.8 1.7           Wound (port site) culture: no growth 01/02/2015  Pathology report  Diagnosis  PERITONEAL/ASCITIC FLUID(SPECIMEN 1 OF 1 COLLECTED 04/26/14): METASTATIC ADENOCARCINOMA WITH SIGNET RING CELL FEATURES.  Stomach, biopsy, r/o cancer 04/25/14 - ADENOCARCINOMA WITH SIGNET RING FEATURES. Microscopic Comment The results were called to Dr. JacArdis Hughs 04/26/2014. (JDP:kh 04/26/14) JOHClaudette Laws Pathologist, Electronic Signature (Case signed 04/26/2014)  HER-2/NEU BY CISH - NO AMPLIFICATION OF HER-2 DETECTED. RESULT RATIO OF HER2: CEP 17 SIGNALS 1.20 AVERAGE HER2 COPY NUMBER PER CELL 2.10  ADDITIONAL INFORMATION:  Mismatch Repair (MMR) Protein Immunohistochemistry (IHC) IHC Expression Result: MLH1: Preserved nuclear expression (greater 50% tumor expression) MSH2: Preserved nuclear expression (greater 50% tumor expression) MSH6: Preserved nuclear expression (greater 50% tumor expression) PMS2: Preserved nuclear expression (greater 50% tumor expression) * Internal control demonstrates intact nuclear expression Interpretation: NORMAL There is preserved expression of the major and minor MMR proteins. There is a very low probability that microsatellite instability (MSI) is present. However, certain clinically significant MMR protein mutations may result in preservation of nuclear expression. It is recommended that the preservation of protein expression be correlated with molecular based MSI testing.  [facility] [employer] testing: (+) CB76 and NF1   RADIOGRAPHIC STUDIES: I have personally reviewed the radiological images as listed and agreed with the findings in the report.  Ct Chest, abdomen and pelvis Wo Contrast12/24/2015    IMPRESSION:  1. Findings, as above, highly concerning for  either esophageal or gastric primary malignancy, with metastatic lymphadenopathy in the upper abdomen and retroperitoneum, widespread intraperitoneal spread of disease, malignant ascites, and multiple pulmonary nodules in the visualize lung bases concerning for pulmonary metastasis. Oncologic evaluation is recommended.  2. Additional incidental findings, as above. These results were called by telephone at the time of interpretation on 04/23/2014 at 7:53 pm to [place] , who verbally acknowledged these results.   Electronically Signed   By: Vinnie Langton M.D.   On: 04/23/2014 19:56   CT CAP 06/24/2014 IMPRESSION: 1. Progressive low density and calcifications within the extensive upper abdominal lymphadenopathy, likely related to partial treatment. Some of the lymph nodes do appear slightly larger. The irregular infiltrating mass involving the gastric cardia appears slightly improved. 2. No evidence of hepatic metastatic disease. 3. Overall volume of ascites has improved, although extensive changes of peritoneal carcinomatosis have not significantly changed. 4. No significant change in bilateral pulmonary nodules consistent with metastatic disease. 5. Resolution of bilateral pleural effusions.  CT chest, abdomen and pelvis with contrast on 08/26/2014 IMPRESSION: Overall stable to slight progression of disease. Existing pulmonary nodules are stable to slightly progressed with no new pulmonary nodules evident. The bulky upper abdominal lymphadenopathy is stable to minimally progressed while no substantial change in the primary infiltrating gastric neoplasm is discernible. Omental disease appears stable. There is slightly more ascites on today's study.  CT chest, abdomen and pelvis with contrast on 10/21/2014 IMPRESSION: 1. Compared to the prior examination there appears to be slight progression of disease predominantly manifest by worsening gastric mucosal thickening,  particularly along the greater curvature of the stomach in the region of the cardia and fundus, with increasing adjacent mesenteric/omental disease and slight increase in malignant ascites. Previously described upper abdominal lymphadenopathy is generally stable to slightly decreased compared to the prior examination, and previously noted pulmonary nodules are unchanged. 2. Additional incidental findings, as above.  CT chest, abdomen and pelvis with contrast, 12/11/2014 at [facility] Impression: #1 not much interval change into gastroesophageal junction mass next #2 number change in the lymph node, lung and peritoneal metastatic disease #3 small amount of intraperitoneal flus is slightly increased.   EGD WITH BIOPSY 04/25/2014 ENDOSCOPIC IMPRESSION: There was a large, nearly circumferential, ulcerated, clearly malignant mass that stradles the GE junction. The vast bulk of the mass lays in the stomach, occupying about 1/3 of the stomach. The mass extends into the esophagus for about 4cm above the GE junction. Mutiple biopsies were taken from the gastric portion of the mass   I ASSESSMENT & PLAN:  39 year old gentleman without significant  past medical history presents with abdominal bloating, nausea, vomiting, diarrhea, anemia and weight loss. EGD showed a large mass arising from the gastric fundus and extending into GE junction, biopsy showed adenocarcinoma with signet ring features, CT scan is consistent with peritoneal carcinomatosis and bilateral lung metastasis. Ascites cytology was positive for malignant cells.  1. 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 HER-2 negative, MMR-normal -I discussed his surgical biopsy results, cytology results, skin findings with patient and his family members, including his parents and 2 sisters. Unfortunately, this is widely metastatic, not curable, and overall prognosis is very poor. Literature  has showed gastric adenocarcinoma with signet ring features has overall worse outcome comparing to others. His life expectancy is probably less than 6 months without treatment. -He has started first-line chemotherapy FOLFOX, tolerated well, and his symptoms have much  improved. -His tumor is negative for HER-2 overexpression, no role for trastuzumab. -We also checked his tumor for MMR, which was normal so he would unlikely benefit from immunotherapy.,  -[facility] genomic test reviewed no actionable mutation -His first restaging CT scan showed mild partial response, overall stable disease. His ascites has significantly decreased. Pleural effusion resolved. He is clinically doing well.  -I discussed his third restaging CT findings with patient and his father. It seems he had a mixed response, slight increase of the gastric wall thickness and the ascites, abdominal lymph nodes are smaller, overall I consider as stable disease or slight disease progression.  -His tumor marker CEA has came down to normal. He is clinically doing well, I recommended continuing FOLFOX. -due to his family history of malignancy, he has had genetic testing at [facility]. It was negative for the comprehensive cancer panel, which included APC, BRCA 1/2, CDE H1, MLH1, MSH2, MSH6, PMS2, etc. -He was seen at [facility] in 12/2014, clinical trial options were exported, he underwent further genetic testing, and the treatment recommendation was to continue current chemotherapy giving the stable disease. He is scheduled to return for follow-up and the CT scan on 03/20/2059 -Lab are reviewed, mild thrombocytopenia, adequate for treatment, we'll proceed cycle 20 chemotherapy today  2. Malignant ascites -s/p paracentesis at the beginning of his diagnosis -Slightly increased on recent CT CT scan, we'll monitor closely.  3. Anemia, multifactorial (iron deficient anemia, cancer related and chemotherapy induced ) -His  ferritin level was normal at 171, but he has low iron and saturation, likely some degree of iron deficient anemia. -He has received IV Feraheme 510 mg twice, 2 weeks apart -Blood transfusion as needed during chemotherapy if her hemoglobin below 8. -Mild anemia has been stable.   Plan #1 cycle 20 FOLFOX today,  Same dose as before, no Neulasta on day 3 with this cycle  #2 RTC in 2 weeks for cycle 21, and I will see him in 4 weeks before cycle 22 #3 repeat CT scan after cycle 23 at [facility]   The patient knows to call the clinic with any problems, questions or concerns.  I spent 20 minutes counseling the patient face to face. The total time spent in the appointment was 25  minutes and more than 50% was on counseling.     Truitt Merle, MD 01/28/2015

## 2015-01-28 NOTE — Patient Instructions (Signed)

## 2015-01-28 NOTE — Patient Instructions (Signed)
[facility] Discharge Instructions for Patients Receiving Chemotherapy  Today you received the following chemotherapy agents Oxaliplatin/Leucovorin/Fluorouracil.  To help prevent nausea and vomiting after your treatment, we encourage you to take your nausea medication as directed.   If you develop nausea and vomiting that is not controlled by your nausea medication, call the clinic.   BELOW ARE SYMPTOMS THAT SHOULD BE REPORTED IMMEDIATELY:  *FEVER GREATER THAN 100.5 F  *CHILLS WITH OR WITHOUT FEVER  NAUSEA AND VOMITING THAT IS NOT CONTROLLED WITH YOUR NAUSEA MEDICATION  *UNUSUAL SHORTNESS OF BREATH  *UNUSUAL BRUISING OR BLEEDING  TENDERNESS IN MOUTH AND THROAT WITH OR WITHOUT PRESENCE OF ULCERS  *URINARY PROBLEMS  *BOWEL PROBLEMS  UNUSUAL RASH Items with * indicate a potential emergency and should be followed up as soon as possible.  Feel free to call the clinic you have any questions or concerns. The clinic phone number is [phone].  Please show the CHEMO ALERT CARD at check-in to the Emergency Department and triage nurse.    

## 2015-01-30 ENCOUNTER — Ambulatory Visit (HOSPITAL_BASED_OUTPATIENT_CLINIC_OR_DEPARTMENT_OTHER): Payer: BLUE CROSS/BLUE SHIELD

## 2015-01-30 VITALS — BP 143/76 | HR 77 | Temp 98.0°F | Resp 18

## 2015-01-30 DIAGNOSIS — C169 Malignant neoplasm of stomach, unspecified: Secondary | ICD-10-CM | POA: Diagnosis not present

## 2015-01-30 MED ORDER — SODIUM CHLORIDE 0.9 % IJ SOLN
10.0000 mL | INTRAMUSCULAR | Status: DC | PRN
  Administered 2015-01-30: 10 mL
  Filled 2015-01-30: qty 10

## 2015-01-30 MED ORDER — HEPARIN SOD (PORK) LOCK FLUSH 100 UNIT/ML IV SOLN
500.0000 [IU] | Freq: Once | INTRAVENOUS | Status: AC | PRN
  Administered 2015-01-30: 500 [IU]
  Filled 2015-01-30: qty 5

## 2015-01-30 NOTE — Patient Instructions (Signed)

## 2015-01-30 NOTE — Progress Notes (Signed)
Pt in for pump d/c today. PAC was flushed, blood return noted. Dressing was starting to come off, when pt was deaccessed,  Redness was noted at port site approx 2 in in diameter. Informed Dr. Burr Medico who observed site. Pt mentioned this was the second time this happened and an infection was ruled out the last time it happened. Pt states the redness dissipates within a couple hours. Pt denies any pain or discomfort, it is not warm to the touch and no drainage was observed at this time. Dr.Feng informed pt to observe site and if it didn't get any better within 2 days to call for further observation. Pt verbalized understanding and denies any questions or concerns at this time.

## 2015-02-11 ENCOUNTER — Ambulatory Visit (HOSPITAL_BASED_OUTPATIENT_CLINIC_OR_DEPARTMENT_OTHER): Payer: BLUE CROSS/BLUE SHIELD

## 2015-02-11 ENCOUNTER — Other Ambulatory Visit (HOSPITAL_BASED_OUTPATIENT_CLINIC_OR_DEPARTMENT_OTHER): Payer: BLUE CROSS/BLUE SHIELD

## 2015-02-11 ENCOUNTER — Ambulatory Visit: Payer: BLUE CROSS/BLUE SHIELD | Admitting: Hematology

## 2015-02-11 VITALS — BP 133/68 | HR 64 | Temp 98.0°F | Resp 20

## 2015-02-11 DIAGNOSIS — Z5111 Encounter for antineoplastic chemotherapy: Secondary | ICD-10-CM

## 2015-02-11 DIAGNOSIS — C169 Malignant neoplasm of stomach, unspecified: Secondary | ICD-10-CM

## 2015-02-11 DIAGNOSIS — Z95828 Presence of other vascular implants and grafts: Secondary | ICD-10-CM

## 2015-02-11 LAB — COMPREHENSIVE METABOLIC PANEL (CC13)
ALT: 19 U/L (ref 0–55)
ANION GAP: 4 meq/L (ref 3–11)
AST: 30 U/L (ref 5–34)
Albumin: 2.7 g/dL — ABNORMAL LOW (ref 3.5–5.0)
Alkaline Phosphatase: 152 U/L — ABNORMAL HIGH (ref 40–150)
BILIRUBIN TOTAL: 0.54 mg/dL (ref 0.20–1.20)
BUN: 15.8 mg/dL (ref 7.0–26.0)
CHLORIDE: 109 meq/L (ref 98–109)
CO2: 27 meq/L (ref 22–29)
CREATININE: 0.8 mg/dL (ref 0.7–1.3)
Calcium: 8.8 mg/dL (ref 8.4–10.4)
EGFR: >90 mL/min/{1.73_m2} (ref >90)
GLUCOSE: 110 mg/dL (ref 70–140)
Potassium: 4 mEq/L (ref 3.5–5.1)
SODIUM: 139 meq/L (ref 136–145)
TOTAL PROTEIN: 6.8 g/dL (ref 6.4–8.3)

## 2015-02-11 LAB — CBC WITH DIFFERENTIAL/PLATELET
BASO%: 1 % (ref 0.0–2.0)
Basophils Absolute: 0 10*3/uL (ref 0.0–0.1)
EOS%: 1.4 % (ref 0.0–7.0)
Eosinophils Absolute: 0.1 10*3/uL (ref 0.0–0.5)
HCT: 37.6 % — ABNORMAL LOW (ref 38.4–49.9)
HGB: 12.4 g/dL — ABNORMAL LOW (ref 13.0–17.1)
LYMPH%: 28 % (ref 14.0–49.0)
MCH: 29.9 pg (ref 27.2–33.4)
MCHC: 32.9 g/dL (ref 32.0–36.0)
MCV: 90.8 fL (ref 79.3–98.0)
MONO#: 0.9 10*3/uL (ref 0.1–0.9)
MONO%: 23.1 % — ABNORMAL HIGH (ref 0.0–14.0)
NEUT%: 46.5 % (ref 39.0–75.0)
NEUTROS ABS: 1.9 10*3/uL (ref 1.5–6.5)
PLATELETS: 172 10*3/uL (ref 140–400)
RBC: 4.14 10*6/uL — AB (ref 4.20–5.82)
RDW: 17.1 % — ABNORMAL HIGH (ref 11.0–14.6)
WBC: 4 10*3/uL (ref 4.0–10.3)
lymph#: 1.1 10*3/uL (ref 0.9–3.3)

## 2015-02-11 LAB — CEA: CEA: 3.6 ng/mL (ref 0.0–5.0)

## 2015-02-11 MED ORDER — SODIUM CHLORIDE 0.9 % IJ SOLN
10.0000 mL | INTRAMUSCULAR | Status: DC | PRN
  Administered 2015-02-11: 10 mL via INTRAVENOUS
  Filled 2015-02-11: qty 10

## 2015-02-11 MED ORDER — LEUCOVORIN CALCIUM INJECTION 350 MG
400.0000 mg/m2 | Freq: Once | INTRAVENOUS | Status: AC
  Administered 2015-02-11: 996 mg via INTRAVENOUS
  Filled 2015-02-11: qty 49.8

## 2015-02-11 MED ORDER — OXALIPLATIN CHEMO INJECTION 100 MG/20ML
80.0000 mg/m2 | Freq: Once | INTRAVENOUS | Status: AC
  Administered 2015-02-11: 200 mg via INTRAVENOUS
  Filled 2015-02-11: qty 40

## 2015-02-11 MED ORDER — PROCHLORPERAZINE MALEATE 10 MG PO TABS
ORAL_TABLET | ORAL | Status: AC
  Filled 2015-02-11: qty 1

## 2015-02-11 MED ORDER — SODIUM CHLORIDE 0.9 % IV SOLN
Freq: Once | INTRAVENOUS | Status: AC
  Administered 2015-02-11: 11:00:00 via INTRAVENOUS
  Filled 2015-02-11: qty 4

## 2015-02-11 MED ORDER — SODIUM CHLORIDE 0.9 % IV SOLN
2280.0000 mg/m2 | INTRAVENOUS | Status: DC
  Administered 2015-02-11: 5700 mg via INTRAVENOUS
  Filled 2015-02-11: qty 114

## 2015-02-11 MED ORDER — PROCHLORPERAZINE MALEATE 10 MG PO TABS
10.0000 mg | ORAL_TABLET | Freq: Once | ORAL | Status: AC
  Administered 2015-02-11: 10 mg via ORAL

## 2015-02-11 MED ORDER — DEXTROSE 5 % IV SOLN
Freq: Once | INTRAVENOUS | Status: AC
  Administered 2015-02-11: 10:00:00 via INTRAVENOUS

## 2015-02-11 NOTE — Patient Instructions (Signed)

## 2015-02-11 NOTE — Patient Instructions (Signed)
[facility] Discharge Instructions for Patients Receiving Chemotherapy  Today you received the following chemotherapy agents Oxaliplatin/Leucovorin/Fluorouracil.  To help prevent nausea and vomiting after your treatment, we encourage you to take your nausea medication as directed.   If you develop nausea and vomiting that is not controlled by your nausea medication, call the clinic.   BELOW ARE SYMPTOMS THAT SHOULD BE REPORTED IMMEDIATELY:  *FEVER GREATER THAN 100.5 F  *CHILLS WITH OR WITHOUT FEVER  NAUSEA AND VOMITING THAT IS NOT CONTROLLED WITH YOUR NAUSEA MEDICATION  *UNUSUAL SHORTNESS OF BREATH  *UNUSUAL BRUISING OR BLEEDING  TENDERNESS IN MOUTH AND THROAT WITH OR WITHOUT PRESENCE OF ULCERS  *URINARY PROBLEMS  *BOWEL PROBLEMS  UNUSUAL RASH Items with * indicate a potential emergency and should be followed up as soon as possible.  Feel free to call the clinic you have any questions or concerns. The clinic phone number is [phone].  Please show the CHEMO ALERT CARD at check-in to the Emergency Department and triage nurse.    

## 2015-02-13 ENCOUNTER — Ambulatory Visit (HOSPITAL_BASED_OUTPATIENT_CLINIC_OR_DEPARTMENT_OTHER): Payer: BLUE CROSS/BLUE SHIELD

## 2015-02-13 VITALS — BP 133/75 | HR 83 | Temp 98.5°F

## 2015-02-13 DIAGNOSIS — Z452 Encounter for adjustment and management of vascular access device: Secondary | ICD-10-CM | POA: Diagnosis not present

## 2015-02-13 DIAGNOSIS — C169 Malignant neoplasm of stomach, unspecified: Secondary | ICD-10-CM

## 2015-02-13 MED ORDER — SODIUM CHLORIDE 0.9 % IJ SOLN
10.0000 mL | INTRAMUSCULAR | Status: DC | PRN
  Administered 2015-02-13: 10 mL
  Filled 2015-02-13: qty 10

## 2015-02-13 MED ORDER — HEPARIN SOD (PORK) LOCK FLUSH 100 UNIT/ML IV SOLN
500.0000 [IU] | Freq: Once | INTRAVENOUS | Status: AC | PRN
  Administered 2015-02-13: 500 [IU]
  Filled 2015-02-13: qty 5

## 2015-02-13 NOTE — Patient Instructions (Signed)

## 2015-02-25 ENCOUNTER — Ambulatory Visit (HOSPITAL_BASED_OUTPATIENT_CLINIC_OR_DEPARTMENT_OTHER): Payer: BLUE CROSS/BLUE SHIELD

## 2015-02-25 ENCOUNTER — Telehealth: Payer: Self-pay | Admitting: *Deleted

## 2015-02-25 ENCOUNTER — Telehealth: Payer: Self-pay | Admitting: Hematology

## 2015-02-25 ENCOUNTER — Ambulatory Visit (HOSPITAL_BASED_OUTPATIENT_CLINIC_OR_DEPARTMENT_OTHER): Payer: BLUE CROSS/BLUE SHIELD | Admitting: Hematology

## 2015-02-25 ENCOUNTER — Other Ambulatory Visit: Payer: BLUE CROSS/BLUE SHIELD

## 2015-02-25 ENCOUNTER — Other Ambulatory Visit (HOSPITAL_BASED_OUTPATIENT_CLINIC_OR_DEPARTMENT_OTHER): Payer: BLUE CROSS/BLUE SHIELD

## 2015-02-25 ENCOUNTER — Other Ambulatory Visit: Payer: Self-pay | Admitting: *Deleted

## 2015-02-25 ENCOUNTER — Encounter: Payer: Self-pay | Admitting: Hematology

## 2015-02-25 VITALS — BP 139/87 | HR 65 | Temp 98.3°F | Resp 18 | Ht 69.0 in | Wt 292.7 lb

## 2015-02-25 DIAGNOSIS — Z5111 Encounter for antineoplastic chemotherapy: Secondary | ICD-10-CM

## 2015-02-25 DIAGNOSIS — C786 Secondary malignant neoplasm of retroperitoneum and peritoneum: Secondary | ICD-10-CM

## 2015-02-25 DIAGNOSIS — C7802 Secondary malignant neoplasm of left lung: Secondary | ICD-10-CM

## 2015-02-25 DIAGNOSIS — C169 Malignant neoplasm of stomach, unspecified: Secondary | ICD-10-CM

## 2015-02-25 DIAGNOSIS — C7801 Secondary malignant neoplasm of right lung: Secondary | ICD-10-CM

## 2015-02-25 DIAGNOSIS — Z95828 Presence of other vascular implants and grafts: Secondary | ICD-10-CM

## 2015-02-25 DIAGNOSIS — C801 Malignant (primary) neoplasm, unspecified: Secondary | ICD-10-CM

## 2015-02-25 DIAGNOSIS — R180 Malignant ascites: Secondary | ICD-10-CM

## 2015-02-25 DIAGNOSIS — D630 Anemia in neoplastic disease: Secondary | ICD-10-CM

## 2015-02-25 DIAGNOSIS — D6481 Anemia due to antineoplastic chemotherapy: Secondary | ICD-10-CM

## 2015-02-25 DIAGNOSIS — D509 Iron deficiency anemia, unspecified: Secondary | ICD-10-CM

## 2015-02-25 LAB — COMPREHENSIVE METABOLIC PANEL (CC13)
ALBUMIN: 2.6 g/dL — AB (ref 3.5–5.0)
ALT: 16 U/L (ref 0–55)
ANION GAP: 8 meq/L (ref 3–11)
AST: 26 U/L (ref 5–34)
Alkaline Phosphatase: 138 U/L (ref 40–150)
BUN: 15.7 mg/dL (ref 7.0–26.0)
CALCIUM: 8.7 mg/dL (ref 8.4–10.4)
CHLORIDE: 108 meq/L (ref 98–109)
CO2: 25 mEq/L (ref 22–29)
CREATININE: 0.8 mg/dL (ref 0.7–1.3)
EGFR: >90 mL/min/{1.73_m2} (ref >90)
Glucose: 93 mg/dl (ref 70–140)
Potassium: 3.7 mEq/L (ref 3.5–5.1)
Sodium: 141 mEq/L (ref 136–145)
Total Bilirubin: 0.67 mg/dL (ref 0.20–1.20)
Total Protein: 6.9 g/dL (ref 6.4–8.3)

## 2015-02-25 LAB — CBC WITH DIFFERENTIAL/PLATELET
BASO%: 0.5 % (ref 0.0–2.0)
Basophils Absolute: 0 10*3/uL (ref 0.0–0.1)
EOS ABS: 0.1 10*3/uL (ref 0.0–0.5)
EOS%: 1.1 % (ref 0.0–7.0)
HCT: 36.4 % — ABNORMAL LOW (ref 38.4–49.9)
HEMOGLOBIN: 11.9 g/dL — AB (ref 13.0–17.1)
LYMPH#: 1.2 10*3/uL (ref 0.9–3.3)
LYMPH%: 28.2 % (ref 14.0–49.0)
MCH: 29.7 pg (ref 27.2–33.4)
MCHC: 32.7 g/dL (ref 32.0–36.0)
MCV: 90.8 fL (ref 79.3–98.0)
MONO#: 0.9 10*3/uL (ref 0.1–0.9)
MONO%: 20.5 % — ABNORMAL HIGH (ref 0.0–14.0)
NEUT%: 49.7 % (ref 39.0–75.0)
NEUTROS ABS: 2.2 10*3/uL (ref 1.5–6.5)
PLATELETS: 185 10*3/uL (ref 140–400)
RBC: 4.01 10*6/uL — ABNORMAL LOW (ref 4.20–5.82)
RDW: 15.8 % — AB (ref 11.0–14.6)
WBC: 4.4 10*3/uL (ref 4.0–10.3)

## 2015-02-25 LAB — CEA: CEA: 3.9 ng/mL (ref 0.0–5.0)

## 2015-02-25 MED ORDER — OXALIPLATIN CHEMO INJECTION 100 MG/20ML
80.0000 mg/m2 | Freq: Once | INTRAVENOUS | Status: AC
  Administered 2015-02-25: 200 mg via INTRAVENOUS
  Filled 2015-02-25: qty 40

## 2015-02-25 MED ORDER — SODIUM CHLORIDE 0.9 % IJ SOLN
10.0000 mL | INTRAMUSCULAR | Status: DC | PRN
  Filled 2015-02-25: qty 10

## 2015-02-25 MED ORDER — LEUCOVORIN CALCIUM INJECTION 350 MG
400.0000 mg/m2 | Freq: Once | INTRAVENOUS | Status: AC
  Administered 2015-02-25: 996 mg via INTRAVENOUS
  Filled 2015-02-25: qty 49.8

## 2015-02-25 MED ORDER — PROCHLORPERAZINE MALEATE 10 MG PO TABS
ORAL_TABLET | ORAL | Status: AC
  Filled 2015-02-25: qty 1

## 2015-02-25 MED ORDER — PROCHLORPERAZINE MALEATE 10 MG PO TABS
10.0000 mg | ORAL_TABLET | Freq: Once | ORAL | Status: AC
  Administered 2015-02-25: 10 mg via ORAL

## 2015-02-25 MED ORDER — SODIUM CHLORIDE 0.9 % IV SOLN
Freq: Once | INTRAVENOUS | Status: AC
  Administered 2015-02-25: 10:00:00 via INTRAVENOUS
  Filled 2015-02-25: qty 4

## 2015-02-25 MED ORDER — SODIUM CHLORIDE 0.9 % IJ SOLN
10.0000 mL | INTRAMUSCULAR | Status: DC | PRN
  Administered 2015-02-25: 10 mL via INTRAVENOUS
  Filled 2015-02-25: qty 10

## 2015-02-25 MED ORDER — HEPARIN SOD (PORK) LOCK FLUSH 100 UNIT/ML IV SOLN
500.0000 [IU] | Freq: Once | INTRAVENOUS | Status: DC | PRN
  Filled 2015-02-25: qty 5

## 2015-02-25 MED ORDER — DEXTROSE 5 % IV SOLN
Freq: Once | INTRAVENOUS | Status: AC
  Administered 2015-02-25: 10:00:00 via INTRAVENOUS

## 2015-02-25 MED ORDER — SODIUM CHLORIDE 0.9 % IV SOLN
2280.0000 mg/m2 | INTRAVENOUS | Status: DC
  Administered 2015-02-25: 5700 mg via INTRAVENOUS
  Filled 2015-02-25: qty 114

## 2015-02-25 NOTE — Patient Instructions (Signed)
[facility] Discharge Instructions for Patients Receiving Chemotherapy  Today you received the following chemotherapy agents Oxaliplatin/Leucovorin/Fluorouracil.  To help prevent nausea and vomiting after your treatment, we encourage you to take your nausea medication as directed.   If you develop nausea and vomiting that is not controlled by your nausea medication, call the clinic.   BELOW ARE SYMPTOMS THAT SHOULD BE REPORTED IMMEDIATELY:  *FEVER GREATER THAN 100.5 F  *CHILLS WITH OR WITHOUT FEVER  NAUSEA AND VOMITING THAT IS NOT CONTROLLED WITH YOUR NAUSEA MEDICATION  *UNUSUAL SHORTNESS OF BREATH  *UNUSUAL BRUISING OR BLEEDING  TENDERNESS IN MOUTH AND THROAT WITH OR WITHOUT PRESENCE OF ULCERS  *URINARY PROBLEMS  *BOWEL PROBLEMS  UNUSUAL RASH Items with * indicate a potential emergency and should be followed up as soon as possible.  Feel free to call the clinic you have any questions or concerns. The clinic phone number is [phone].  Please show the CHEMO ALERT CARD at check-in to the Emergency Department and triage nurse.    

## 2015-02-25 NOTE — Patient Instructions (Signed)

## 2015-02-25 NOTE — Progress Notes (Signed)
[facility]  [phone] [phone]  Clinic follow-up Note   Patient Care Team: No Pcp Per Patient as PCP - General (General Practice) 02/25/2015  CHIEF COMPLAINTS Follow up metastatic gastric cancer    Gastric adenocarcinoma: Stage IV   04/23/2014 Imaging CT abdomen showed peritoneal carcinomatosis, ascites, thickning of gastric and GEJ wall, CT chest showed a few small b/l lung nodules.    04/24/2014 Tumor Marker CEA  5.6, CA19.9 13   04/25/2014 Initial Diagnosis Gastric adenocarcinoma: Stage IV   04/25/2014 Pathology Results Stomach mass biopsy showed - ADENOCARCINOMA WITH SIGNET RING FEATURES. ascites cytology (+)   04/25/2014 Procedure EGD: There was a large, nearly circumferential, ulcerated, clearly malignant mass that stradles the GE junction. The vast bulk of the mass lays in the stomach.    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 I saw him first when he was recently admitted to [facility]. He is here for the first office visit after hospital discharge.  He was  admitted on 12/15 with progressive, 4 month history of dysphagia, epigastric abdominal discomfort and bloating, early satiation accompanied by nausea, and postprandial vomiting and diarrhea. He has experienced a 30 lbs weight loss in the process. He has noted increased abdominal girth. Patient denies any shortness of breath, chest pain, GI bleed, hematuria or lower extremity swelling. He denies heavy alcohol intake. Denies tobacco abuse. Denies risk  factors for HIV or hepatitis. He denies any family history of GI malignancies.  CT of the abdomen and pelvis with contrast on 12/15 revealed several pulmonary nodules at the bases bilaterally, profound thickening of the distal esophagus extending beyond the gastroesophageal junction into the proximal stomach,cardia and fundus of the stomach, most evident along the lesser curvature. No pathologic dilatation of small bowel or colon. Extensive upper abdominal and retroperitoneal lymphadenopathy was visualized. Moderate volume of ascites, presumably malignant, was noted. Extensive soft tissue nodularity throughout the omentum, compatible with omental caking and widespread peritoneal metastasis. Additionally, there are other areas of enhancing nodularity along the peritoneal surface. Tiny ventral and umbilical hernias containing predominantly omental fat, including some omental implants. There are no aggressive appearing lytic or blastic lesions noted in the visualized portions of the skeleton.  He underwent an EGD by Dr. Ardis Hughs on 04/25/2014, which showed a large fungating mass that straddles the GE junction and occupying the upper 1/3 part of the stomach. Multiple biopsy was taken which showed adenocarcinoma with signet ring features. He also underwent a paracentesis with 4.5 L fluid is removed on 04/26/2014. Cytology was positive for adenocarcinoma with signet ring features, consistent with metastatic disease.  He was discharged to home on 04/27/2014. He did feel a slightly informant of his abdominal bloating after the paracentesis, but not much improvement other symptoms. He had Mediport placement and the second paracentesis was again 4 L fluids removal on 05/02/2014. His appetite is low, he drinks ensure 3 bottles a day, some soup and a very little other foods. His energy level is also low, but able to take care of all  his daily needs, and move around with about much difficulty. His abdominal  discomfort/pain are relatively controlled by morphine 15 mg at night and tramadol 2-3 times during the day. He denies any fever, chills, cough, chest pain or any in the other discomfort.  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 He is clinically doing very well. He denies any new symptoms. He has good appetite and energy level, functions well at home. He denies any pain, nausea, abdominal bloating, fever or chills. His bowel movement is normal, weight is stable. He has been tolerating treatment well without significant toxicity.  MEDICAL HISTORY:  Past Medical History  Diagnosis Date  . Medical history non-contributory   . gastric ca dx'd 04/2014    SURGICAL HISTORY: Past Surgical History  Procedure Laterality Date  . Squamous cell carcinoma excision  approx March 2015    removed from top of head  . Esophagogastroduodenoscopy (egd) with propofol N/A 04/25/2014    Procedure: ESOPHAGOGASTRODUODENOSCOPY (EGD) WITH PROPOFOL;  Surgeon: Milus Banister, MD;  Location: [facility];  Service: Endoscopy;  Laterality: N/A;    SOCIAL HISTORY: Social History   Social History  . Marital Status: Single    Spouse Name: N/A  . Number of Children: N/A  . Years of Education: N/A   Occupational History  . Not on file.   Social History Main Topics  . Smoking status: Never Smoker   . Smokeless tobacco: Never Used  . Alcohol Use: No  . Drug Use: No  . Sexual Activity: Not on file   Other Topics Concern  . Not on file   Social History Narrative   Single, lives alone with pet dog (rescue dog)   Works as Information systems manager; drives; independent   Middle child between #2 sisters-his mother and sisters are very supportive    FAMILY HISTORY: Family History  Problem Relation Age of Onset  . Breast cancer Sister 32   . Hypertension Mother   . Hypertension Father     Maternal aunt breast cancer at age of 61 Maternal cousin ovarian cancer at age of 58 Maternal ancle prostate cancer age of 21   ALLERGIES:  has No Known Allergies.  MEDICATIONS:  Current Outpatient Prescriptions  Medication Sig Dispense Refill  . docusate sodium 100 MG CAPS Take 100 mg by mouth 2 (two) times daily. 10 capsule 0  . lidocaine-prilocaine (EMLA) cream Apply 1 application topically as needed. 30 g 2  . mirtazapine (REMERON) 15 MG tablet Take 1 tablet (15 mg total) by mouth at bedtime. 30 tablet 1  . ondansetron (ZOFRAN) 4 MG tablet Take 1 tablet (4 mg total) by mouth every 8 (eight) hours as needed for nausea or vomiting. 20 tablet 0  . pantoprazole (PROTONIX) 40 MG tablet Take 1 tablet (40 mg total) by mouth daily. 30 tablet 0  . prochlorperazine (COMPAZINE) 10 MG tablet Take 1 tablet (10 mg total) by mouth every 6 (six) hours as needed for nausea or vomiting. 30 tablet 3  . morphine (MS CONTIN) 15 MG 12 hr tablet Take 1 tablet (15 mg total) by mouth every 12 (twelve) hours. (Patient not taking: Reported on 02/25/2015) 60 tablet 0  . traMADol (ULTRAM) 50 MG tablet Take 1-2 tablets (50-100 mg total) by mouth every 6 (six) hours as needed for moderate pain. (Patient not taking: Reported on 02/25/2015) 30 tablet 0   No current  facility-administered medications for this visit.   Facility-Administered Medications Ordered in Other Visits  Medication Dose Route Frequency Provider Last Rate Last Dose  . sodium chloride 0.9 % injection 10 mL  10 mL Intravenous PRN Truitt Merle, MD   10 mL at 12/31/14 0900    REVIEW OF SYSTEMS:   Constitutional: Denies fevers, chills or abnormal night sweats, no recent weight loss Eyes: Denies blurriness of vision, double vision or watery eyes Ears, nose, mouth, throat, and face: Denies mucositis or sore throat Respiratory: Denies cough, dyspnea or wheezes Cardiovascular: Denies palpitation, chest discomfort or lower extremity  swelling Gastrointestinal:  Less nausea, abdominal bloating and discomfort Skin: Denies abnormal skin rashes Lymphatics: Denies new lymphadenopathy or easy bruising Neurological:Denies numbness, tingling or new weaknesses Behavioral/Psych: Mood is stable, no new changes  All other systems were reviewed with the patient and are negative except those mentioned in the history.  PHYSICAL EXAMINATION: ECOG PERFORMANCE STATUS: 1 GENERAL:alert, no distress and comfortable SKIN: skin color, texture, turgor are normal, no rashes or significant lesions EYES: normal, conjunctiva are pink and non-injected, sclera clear OROPHARYNX:no exudate, no erythema and lips, buccal mucosa, and tongue normal  NECK: supple, thyroid normal size, non-tender, without nodularity LYMPH:  no palpable lymphadenopathy in the cervical, axillary or inguinal LUNGS: clear to auscultation and percussion with normal breathing effort HEART: regular rate & rhythm and no murmurs and no lower extremity edema ABDOMEN:abdomen soft, non-tender and normal bowel sounds, probable small amount of ascites. Musculoskeletal:no cyanosis of digits and no clubbing  PSYCH: alert & oriented x 3 with fluent speech NEURO: no focal motor/sensory deficits  LABORATORY DATA:  I have reviewed the data as listed CBC Latest Ref Rng 02/25/2015 02/11/2015 01/28/2015  WBC 4.0 - 10.3 10e3/uL 4.4 4.0 3.8(L)  Hemoglobin 13.0 - 17.1 g/dL 11.9(L) 12.4(L) 12.1(L)  Hematocrit 38.4 - 49.9 % 36.4(L) 37.6(L) 36.2(L)  Platelets 140 - 400 10e3/uL 185 172 125(L)      CMP Latest Ref Rng 02/25/2015 02/11/2015 01/28/2015  Glucose 70 - 140 mg/dl 93 110 121  BUN 7.0 - 26.0 mg/dL 15.7 15.8 20.2  Creatinine 0.7 - 1.3 mg/dL 0.8 0.8 0.7  Sodium 136 - 145 mEq/L 141 139 141  Potassium 3.5 - 5.1 mEq/L 3.7 4.0 3.5  Chloride 96 - 112 mEq/L - - -  CO2 22 - 29 mEq/L _0 Calcium 8.4 - 10.4 mg/dL 8.7 8.8 8.4  Total Protein 6.4 - 8.3 g/dL 6.9 6.8 6.6  Total Bilirubin 0.20  - 1.20 mg/dL 0.67 0.54 0.79  Alkaline Phos 40 - 150 U/L 138 152(H) 143  AST 5 - 34 U/L _1 ALT 0 - 55 U/L _2 CEA  Status: Finalresult Visible to patient:  MyChart Nextappt: Today at 08:30 AM in Oncology Samuel Durham, Krista Blue, MD) Dx:  Gastric adenocarcinoma: Stage IV           Ref Range 2wk ago (02/11/15) 151moago (01/14/15) 256mogo (12/17/14) 51m58moo (12/03/14)    CEA 0.0 - 5.0 ng/mL 3.6 1.8 1.0 1.8          Pathology report  Diagnosis  PERITONEAL/ASCITIC FLUID(SPECIMEN 1 OF 1 COLLECTED 04/26/14): METASTATIC ADENOCARCINOMA WITH SIGNET RING CELL FEATURES.  Stomach, biopsy, r/o cancer 04/25/14 - ADENOCARCINOMA WITH SIGNET RING FEATURES. Microscopic Comment The results were called to Dr. JacArdis Hughs 04/26/2014. (JDP:kh 04/26/14) JOHClaudette Laws Pathologist, Electronic Signature (Case signed 04/26/2014)  HER-2/NEU BY CISH - NO AMPLIFICATION OF HER-2 DETECTED. RESULT RATIO  OF HER2: CEP 17 SIGNALS 1.20 AVERAGE HER2 COPY NUMBER PER CELL 2.10  ADDITIONAL INFORMATION: Mismatch Repair (MMR) Protein Immunohistochemistry (IHC) IHC Expression Result: MLH1: Preserved nuclear expression (greater 50% tumor expression) MSH2: Preserved nuclear expression (greater 50% tumor expression) MSH6: Preserved nuclear expression (greater 50% tumor expression) PMS2: Preserved nuclear expression (greater 50% tumor expression) * Internal control demonstrates intact nuclear expression Interpretation: NORMAL There is preserved expression of the major and minor MMR proteins. There is a very low probability that microsatellite instability (MSI) is present. However, certain clinically significant MMR protein mutations may result in preservation of nuclear expression. It is recommended that the preservation of protein expression be correlated with molecular based MSI testing.  [facility] [employer] testing: (+) VZ85 and NF1   RADIOGRAPHIC STUDIES: I have personally reviewed the  radiological images as listed and agreed with the findings in the report.  Ct Chest, abdomen and pelvis Wo Contrast12/24/2015    IMPRESSION:  1. Findings, as above, highly concerning for either esophageal or gastric primary malignancy, with metastatic lymphadenopathy in the upper abdomen and retroperitoneum, widespread intraperitoneal spread of disease, malignant ascites, and multiple pulmonary nodules in the visualize lung bases concerning for pulmonary metastasis. Oncologic evaluation is recommended.  2. Additional incidental findings, as above. These results were called by telephone at the time of interpretation on 04/23/2014 at 7:53 pm to [place] , who verbally acknowledged these results.   Electronically Signed   By: Vinnie Langton M.D.   On: 04/23/2014 19:56   CT CAP 06/24/2014 IMPRESSION: 1. Progressive low density and calcifications within the extensive upper abdominal lymphadenopathy, likely related to partial treatment. Some of the lymph nodes do appear slightly larger. The irregular infiltrating mass involving the gastric cardia appears slightly improved. 2. No evidence of hepatic metastatic disease. 3. Overall volume of ascites has improved, although extensive changes of peritoneal carcinomatosis have not significantly changed. 4. No significant change in bilateral pulmonary nodules consistent with metastatic disease. 5. Resolution of bilateral pleural effusions.  CT chest, abdomen and pelvis with contrast on 08/26/2014 IMPRESSION: Overall stable to slight progression of disease. Existing pulmonary nodules are stable to slightly progressed with no new pulmonary nodules evident. The bulky upper abdominal lymphadenopathy is stable to minimally progressed while no substantial change in the primary infiltrating gastric neoplasm is discernible. Omental disease appears stable. There is slightly more ascites on today's study.  CT chest, abdomen and pelvis with contrast  on 10/21/2014 IMPRESSION: 1. Compared to the prior examination there appears to be slight progression of disease predominantly manifest by worsening gastric mucosal thickening, particularly along the greater curvature of the stomach in the region of the cardia and fundus, with increasing adjacent mesenteric/omental disease and slight increase in malignant ascites. Previously described upper abdominal lymphadenopathy is generally stable to slightly decreased compared to the prior examination, and previously noted pulmonary nodules are unchanged. 2. Additional incidental findings, as above.  CT chest, abdomen and pelvis with contrast, 12/11/2014 at [facility] Impression: #1 not much interval change into gastroesophageal junction mass next #2 number change in the lymph node, lung and peritoneal metastatic disease #3 small amount of intraperitoneal flus is slightly increased.   EGD WITH BIOPSY 04/25/2014 ENDOSCOPIC IMPRESSION: There was a large, nearly circumferential, ulcerated, clearly malignant mass that stradles the GE junction. The vast bulk of the mass lays in the stomach, occupying about 1/3 of the stomach. The mass extends into the esophagus for about 4cm above the GE junction. Mutiple biopsies were taken from the  gastric portion of the mass   I ASSESSMENT & PLAN:  39 year old gentleman without significant past medical history presents with abdominal bloating, nausea, vomiting, diarrhea, anemia and weight loss. EGD showed a large mass arising from the gastric fundus and extending into GE junction, biopsy showed adenocarcinoma with signet ring features, CT scan is consistent with peritoneal carcinomatosis and bilateral lung metastasis. Ascites cytology was positive for malignant cells.  1. 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 HER-2 negative, MMR-normal -I discussed his surgical biopsy results, cytology results, skin  findings with patient and his family members, including his parents and 2 sisters. Unfortunately, this is widely metastatic, not curable, and overall prognosis is very poor. Literature has showed gastric adenocarcinoma with signet ring features has overall worse outcome comparing to others. His life expectancy is probably less than 6 months without treatment. -He has started first-line chemotherapy FOLFOX, tolerated well, and his symptoms have much  improved. -His tumor is negative for HER-2 overexpression, no role for trastuzumab. -We also checked his tumor for MMR, which was normal so he would unlikely benefit from immunotherapy.,  -[facility] genomic test reviewed no actionable mutation -His first restaging CT scan showed mild partial response, overall stable disease. His ascites has significantly decreased. Pleural effusion resolved. He is clinically doing well.  -I discussed his third restaging CT findings with patient and his father. It seems he had a mixed response, slight increase of the gastric wall thickness and the ascites, abdominal lymph nodes are smaller, overall I consider as stable disease or slight disease progression.  -His tumor marker CEA has came down to normal. He is clinically doing well, I recommended continuing FOLFOX. -due to his family history of malignancy, he has had genetic testing at [facility]. It was negative for the comprehensive cancer panel, which included APC, BRCA 1/2, CDE H1, MLH1, MSH2, MSH6, PMS2, etc. -He was seen at [facility] in 12/2014, clinical trial options were exported, he underwent further genetic testing, and the treatment recommendation was to continue current chemotherapy giving the stable disease. He is scheduled to return for follow-up and the CT scan on 03/20/2015 -Lab are reviewed, adequate for treatment, we'll proceed cycle 22 chemotherapy today -He is going to [facility] after next cycle chemotherapy.  2. Malignant  ascites -s/p paracentesis at the beginning of his diagnosis -Slightly increased on recent CT CT scan, we'll monitor closely.  3. Anemia, multifactorial (iron deficient anemia, cancer related and chemotherapy induced ) -His ferritin level was normal at 171, but he has low iron and saturation, likely some degree of iron deficient anemia. -He has received IV Feraheme 510 mg twice, 2 weeks apart -Blood transfusion as needed during chemotherapy if her hemoglobin below 8. -Mild anemia has been stable.   Plan #1 cycle 22 FOLFOX today,  Same dose as before, no Neulasta on day 3 with this cycle  #2 RTC in 2 weeks for cycle 23, and I will see him in 4 weeks before cycle 24 #3 repeat CT scan after cycle 23 at [facility] on 11/10   The patient knows to call the clinic with any problems, questions or concerns.  I spent 20 minutes counseling the patient face to face. The total time spent in the appointment was 25  minutes and more than 50% was on counseling.     Truitt Merle, MD 02/25/2015

## 2015-02-25 NOTE — Telephone Encounter (Signed)
per pof to sch pt appt-sent MW emaill to sch trmt & pump d/c-pt to get updated avs b4 leaving trmt room

## 2015-02-25 NOTE — Telephone Encounter (Signed)
Per staff message and POF I have scheduled appts. Advised scheduler of appts. JMW  

## 2015-02-27 ENCOUNTER — Ambulatory Visit (HOSPITAL_BASED_OUTPATIENT_CLINIC_OR_DEPARTMENT_OTHER): Payer: BLUE CROSS/BLUE SHIELD

## 2015-02-27 VITALS — BP 142/86 | HR 79 | Temp 98.3°F | Resp 20

## 2015-02-27 DIAGNOSIS — Z299 Encounter for prophylactic measures, unspecified: Secondary | ICD-10-CM

## 2015-02-27 DIAGNOSIS — C169 Malignant neoplasm of stomach, unspecified: Secondary | ICD-10-CM

## 2015-02-27 DIAGNOSIS — Z452 Encounter for adjustment and management of vascular access device: Secondary | ICD-10-CM

## 2015-02-27 MED ORDER — INFLUENZA VAC SPLIT QUAD 0.5 ML IM SUSY
0.5000 mL | PREFILLED_SYRINGE | Freq: Once | INTRAMUSCULAR | Status: DC
Start: 2015-02-27 — End: 2015-02-27
  Filled 2015-02-27: qty 0.5

## 2015-02-27 MED ORDER — SODIUM CHLORIDE 0.9 % IJ SOLN
10.0000 mL | INTRAMUSCULAR | Status: DC | PRN
Start: 2015-02-27 — End: 2015-02-27
  Administered 2015-02-27: 10 mL
  Filled 2015-02-27: qty 10

## 2015-02-27 MED ORDER — HEPARIN SOD (PORK) LOCK FLUSH 100 UNIT/ML IV SOLN
500.0000 [IU] | Freq: Once | INTRAVENOUS | Status: AC | PRN
  Administered 2015-02-27: 500 [IU]
  Filled 2015-02-27: qty 5

## 2015-02-27 NOTE — Progress Notes (Signed)
Pt refused flu shot today. Nurse encouraged pt to take vaccine to prevent infuenza. Pt still declines ro accept flu vaccine.

## 2015-02-27 NOTE — Patient Instructions (Signed)

## 2015-03-02 ENCOUNTER — Encounter: Payer: Self-pay | Admitting: Hematology

## 2015-03-03 ENCOUNTER — Other Ambulatory Visit: Payer: Self-pay | Admitting: Hematology

## 2015-03-03 ENCOUNTER — Telehealth: Payer: Self-pay | Admitting: Hematology

## 2015-03-03 ENCOUNTER — Telehealth: Payer: Self-pay | Admitting: *Deleted

## 2015-03-03 DIAGNOSIS — C169 Malignant neoplasm of stomach, unspecified: Secondary | ICD-10-CM

## 2015-03-03 NOTE — Telephone Encounter (Signed)
[facility] WILL CONTACT PATIENT RE [place] APPOINTMENT. NO OTHER ORDERS PER 10/24 POF.

## 2015-03-03 NOTE — Telephone Encounter (Signed)
Received vm message from pt @ 4:20 pm requesting a call back regarding feeling bloated. TC to patient. He states he was pretty uncomfortable on Saturday with abdominal bloating etc (see My Chart Note from 03/02/15). He states he feels a bit better today but states that the bloated feeling is very similar to the feeling he had when he needed his ascites drained back in January.  He wanted Dr. Burr Medico to know and to ask if she thinks he might need that again or at least investigate the possiblility  Pt asking to speak with Dr. Burr Medico tomorrow, 03/04/15

## 2015-03-03 NOTE — Telephone Encounter (Signed)
I will send a urgent request for paracentesis. Myrtle, please call IR tomorrow morning. I will send POF and order.  Thanks.  Truitt Merle  03/03/2015 5:06 PM

## 2015-03-04 NOTE — Telephone Encounter (Signed)
Several messages left yest & today for pt to call back to discuss.  Paracentesis scheduled for 03/10/15.  Left message on pt's identified vm & also called pt 's father who will have pt call us to see if he feels he can make it to 03/10/15 for fluid to be removed.

## 2015-03-10 ENCOUNTER — Other Ambulatory Visit: Payer: Self-pay | Admitting: *Deleted

## 2015-03-10 ENCOUNTER — Ambulatory Visit (HOSPITAL_COMMUNITY)
Admission: RE | Admit: 2015-03-10 | Discharge: 2015-03-10 | Disposition: A | Discharge status: Home / Self Care | Payer: BLUE CROSS/BLUE SHIELD | Source: Ambulatory Visit | Attending: Hematology | Admitting: Hematology

## 2015-03-10 DIAGNOSIS — C786 Secondary malignant neoplasm of retroperitoneum and peritoneum: Secondary | ICD-10-CM

## 2015-03-10 DIAGNOSIS — R180 Malignant ascites: Secondary | ICD-10-CM | POA: Insufficient documentation

## 2015-03-10 DIAGNOSIS — C801 Malignant (primary) neoplasm, unspecified: Secondary | ICD-10-CM

## 2015-03-10 DIAGNOSIS — C169 Malignant neoplasm of stomach, unspecified: Secondary | ICD-10-CM | POA: Diagnosis not present

## 2015-03-10 DIAGNOSIS — R188 Other ascites: Secondary | ICD-10-CM | POA: Diagnosis present

## 2015-03-10 NOTE — Procedures (Signed)
Ultrasound-guided therapeutic paracentesis performed yielding 5.5 L slightly turbid, yellow fluid. No immediate complications.

## 2015-03-11 ENCOUNTER — Encounter: Payer: Self-pay | Admitting: Hematology

## 2015-03-11 ENCOUNTER — Ambulatory Visit (HOSPITAL_BASED_OUTPATIENT_CLINIC_OR_DEPARTMENT_OTHER): Payer: BLUE CROSS/BLUE SHIELD

## 2015-03-11 ENCOUNTER — Ambulatory Visit: Payer: BLUE CROSS/BLUE SHIELD

## 2015-03-11 ENCOUNTER — Ambulatory Visit (HOSPITAL_BASED_OUTPATIENT_CLINIC_OR_DEPARTMENT_OTHER): Payer: BLUE CROSS/BLUE SHIELD | Admitting: Hematology

## 2015-03-11 ENCOUNTER — Other Ambulatory Visit (HOSPITAL_BASED_OUTPATIENT_CLINIC_OR_DEPARTMENT_OTHER): Payer: BLUE CROSS/BLUE SHIELD

## 2015-03-11 VITALS — BP 134/77 | HR 63 | Temp 98.5°F | Resp 18 | Wt 280.8 lb

## 2015-03-11 DIAGNOSIS — C169 Malignant neoplasm of stomach, unspecified: Secondary | ICD-10-CM

## 2015-03-11 DIAGNOSIS — C786 Secondary malignant neoplasm of retroperitoneum and peritoneum: Secondary | ICD-10-CM

## 2015-03-11 DIAGNOSIS — C7801 Secondary malignant neoplasm of right lung: Secondary | ICD-10-CM

## 2015-03-11 DIAGNOSIS — C801 Malignant (primary) neoplasm, unspecified: Secondary | ICD-10-CM

## 2015-03-11 DIAGNOSIS — Z5111 Encounter for antineoplastic chemotherapy: Secondary | ICD-10-CM

## 2015-03-11 DIAGNOSIS — C7802 Secondary malignant neoplasm of left lung: Secondary | ICD-10-CM

## 2015-03-11 DIAGNOSIS — D630 Anemia in neoplastic disease: Secondary | ICD-10-CM

## 2015-03-11 DIAGNOSIS — D509 Iron deficiency anemia, unspecified: Secondary | ICD-10-CM

## 2015-03-11 DIAGNOSIS — R180 Malignant ascites: Secondary | ICD-10-CM

## 2015-03-11 DIAGNOSIS — D6481 Anemia due to antineoplastic chemotherapy: Secondary | ICD-10-CM

## 2015-03-11 LAB — COMPREHENSIVE METABOLIC PANEL (CC13)
ALT: 17 U/L (ref 0–55)
AST: 29 U/L (ref 5–34)
Albumin: 2.4 g/dL — ABNORMAL LOW (ref 3.5–5.0)
Alkaline Phosphatase: 140 U/L (ref 40–150)
Anion Gap: 7 mEq/L (ref 3–11)
BILIRUBIN TOTAL: 0.57 mg/dL (ref 0.20–1.20)
BUN: 16.9 mg/dL (ref 7.0–26.0)
CALCIUM: 8.7 mg/dL (ref 8.4–10.4)
CHLORIDE: 109 meq/L (ref 98–109)
CO2: 25 mEq/L (ref 22–29)
CREATININE: 0.7 mg/dL (ref 0.7–1.3)
EGFR: >90 mL/min/{1.73_m2} (ref >90)
Glucose: 92 mg/dl (ref 70–140)
Potassium: 4 mEq/L (ref 3.5–5.1)
Sodium: 140 mEq/L (ref 136–145)
TOTAL PROTEIN: 6.6 g/dL (ref 6.4–8.3)

## 2015-03-11 LAB — CBC WITH DIFFERENTIAL/PLATELET
BASO%: 0.8 % (ref 0.0–2.0)
Basophils Absolute: 0 10*3/uL (ref 0.0–0.1)
EOS ABS: 0 10*3/uL (ref 0.0–0.5)
EOS%: 1.2 % (ref 0.0–7.0)
HCT: 38.1 % — ABNORMAL LOW (ref 38.4–49.9)
HGB: 12.4 g/dL — ABNORMAL LOW (ref 13.0–17.1)
LYMPH%: 26.7 % (ref 14.0–49.0)
MCH: 29.3 pg (ref 27.2–33.4)
MCHC: 32.6 g/dL (ref 32.0–36.0)
MCV: 89.9 fL (ref 79.3–98.0)
MONO#: 0.9 10*3/uL (ref 0.1–0.9)
MONO%: 21.6 % — AB (ref 0.0–14.0)
NEUT%: 49.7 % (ref 39.0–75.0)
NEUTROS ABS: 2 10*3/uL (ref 1.5–6.5)
Platelets: 215 10*3/uL (ref 140–400)
RBC: 4.24 10*6/uL (ref 4.20–5.82)
RDW: 16.1 % — AB (ref 11.0–14.6)
WBC: 4 10*3/uL (ref 4.0–10.3)
lymph#: 1.1 10*3/uL (ref 0.9–3.3)

## 2015-03-11 MED ORDER — DEXTROSE 5 % IV SOLN
Freq: Once | INTRAVENOUS | Status: AC
  Administered 2015-03-11: 11:00:00 via INTRAVENOUS

## 2015-03-11 MED ORDER — SODIUM CHLORIDE 0.9 % IV SOLN
Freq: Once | INTRAVENOUS | Status: AC
  Administered 2015-03-11: 12:00:00 via INTRAVENOUS
  Filled 2015-03-11: qty 4

## 2015-03-11 MED ORDER — PROCHLORPERAZINE MALEATE 10 MG PO TABS
ORAL_TABLET | ORAL | Status: AC
  Filled 2015-03-11: qty 1

## 2015-03-11 MED ORDER — SODIUM CHLORIDE 0.9 % IV SOLN
2280.0000 mg/m2 | INTRAVENOUS | Status: DC
  Administered 2015-03-11: 5700 mg via INTRAVENOUS
  Filled 2015-03-11: qty 114

## 2015-03-11 MED ORDER — SODIUM CHLORIDE 0.9 % IJ SOLN
10.0000 mL | INTRAMUSCULAR | Status: DC | PRN
  Administered 2015-03-11: 10 mL via INTRAVENOUS
  Filled 2015-03-11: qty 10

## 2015-03-11 MED ORDER — LEUCOVORIN CALCIUM INJECTION 350 MG
400.0000 mg/m2 | Freq: Once | INTRAMUSCULAR | Status: AC
  Administered 2015-03-11: 996 mg via INTRAVENOUS
  Filled 2015-03-11: qty 49.8

## 2015-03-11 MED ORDER — PROCHLORPERAZINE MALEATE 10 MG PO TABS
10.0000 mg | ORAL_TABLET | Freq: Once | ORAL | Status: AC
  Administered 2015-03-11: 10 mg via ORAL

## 2015-03-11 MED ORDER — OXALIPLATIN CHEMO INJECTION 100 MG/20ML
80.0000 mg/m2 | Freq: Once | INTRAVENOUS | Status: AC
  Administered 2015-03-11: 200 mg via INTRAVENOUS
  Filled 2015-03-11: qty 40

## 2015-03-11 NOTE — Progress Notes (Signed)
[place]  [phone] [phone]  Clinic follow-up Note   Patient Care Team: No Pcp Per Patient as PCP - General (General Practice) 03/11/2015  CHIEF COMPLAINTS Follow up metastatic gastric cancer    Gastric adenocarcinoma: Stage IV   04/23/2014 Imaging CT abdomen showed peritoneal carcinomatosis, ascites, thickning of gastric and GEJ wall, CT chest showed a few small b/l lung nodules.    04/24/2014 Tumor Marker CEA  5.6, CA19.9 13   04/25/2014 Initial Diagnosis Gastric adenocarcinoma: Stage IV   04/25/2014 Pathology Results Stomach mass biopsy showed - ADENOCARCINOMA WITH SIGNET RING FEATURES. ascites cytology (+)   04/25/2014 Procedure EGD: There was a large, nearly circumferential, ulcerated, clearly malignant mass that stradles the GE junction. The vast bulk of the mass lays in the stomach.    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 I saw him first when [facility] was [facility] He is here for the first office visit after hospital discharge.  He was  admitted on 12/15 with progressive, 4 month history of dysphagia, epigastric abdominal discomfort and bloating, early satiation accompanied by nausea, and postprandial vomiting and diarrhea. He has experienced a 30 lbs weight loss in the process. He has noted increased abdominal girth. Patient denies any shortness of breath, chest pain, GI bleed, hematuria or lower extremity swelling. He denies heavy alcohol intake. Denies tobacco  abuse. Denies risk factors for HIV or hepatitis. He denies any family history of GI malignancies.  CT of the abdomen and pelvis with contrast on 12/15 revealed several pulmonary nodules at the bases bilaterally, profound thickening of the distal esophagus extending beyond the gastroesophageal junction into the proximal stomach,cardia and fundus of the stomach, most evident along the lesser curvature. No pathologic dilatation of small bowel or colon. Extensive upper abdominal and retroperitoneal lymphadenopathy was visualized. Moderate volume of ascites, presumably malignant, was noted. Extensive soft tissue nodularity throughout the omentum, compatible with omental caking and widespread peritoneal metastasis. Additionally, there are other areas of enhancing nodularity along the peritoneal surface. Tiny ventral and umbilical hernias containing predominantly omental fat, including some omental implants. There are no aggressive appearing lytic or blastic lesions noted in the visualized portions of the skeleton.  He underwent an EGD by Dr. JArdis Hughson 04/25/2014, which showed a large fungating mass that straddles the GE junction and occupying the upper 1/3 part of the stomach. Multiple biopsy was taken which showed adenocarcinoma with signet ring features. He also underwent a paracentesis with 4.5 L fluid is removed on 04/26/2014. Cytology was positive for adenocarcinoma with signet ring features, consistent with metastatic disease.  He was discharged to home on 04/27/2014. He did feel a slightly informant of his abdominal bloating after the paracentesis, but not much improvement other symptoms. He had Mediport placement and the second paracentesis was again 4 L fluids removal on 05/02/2014. His appetite is low, he drinks ensure 3 bottles a day, some soup and a very little other foods. His energy level is also low, but able to take care  of all his daily needs, and move around with about much difficulty. His  abdominal discomfort/pain are relatively controlled by morphine 15 mg at night and tramadol 2-3 times during the day. He denies any fever, chills, cough, chest pain or any in the other discomfort.  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 He noticed abdominal bloating and slightly abdominal discomfort last week, no fever, nausea, or other changes. He called my office and I scheduled him for paracentesis, which he underwent yesterday. 5.5 L of ascites was removed, he felt better afterwards. He came in today for chemotherapy, is accompanied by his parents. He feels well overall, denies any pain, nausea, bloating, or other symptoms. His appetite and energy level remains well.  MEDICAL HISTORY:  Past Medical History  Diagnosis Date  . Medical history non-contributory   . gastric ca dx'd 04/2014    SURGICAL HISTORY: Past Surgical History  Procedure Laterality Date  . Squamous cell carcinoma excision  approx March 2015    removed from top of head  . Esophagogastroduodenoscopy (egd) with propofol N/A 04/25/2014    Procedure: ESOPHAGOGASTRODUODENOSCOPY (EGD) WITH PROPOFOL;  Surgeon: Milus Banister, MD;  Location: [facility];  Service: Endoscopy;  Laterality: N/A;    SOCIAL HISTORY: Social History   Social History  . Marital Status: Single    Spouse Name: N/A  . Number of Children: N/A  . Years of Education: N/A   Occupational History  . Not on file.   Social History Main Topics  . Smoking status: Never Smoker   . Smokeless tobacco: Never Used  . Alcohol Use: No  . Drug Use: No  . Sexual Activity: Not on file   Other Topics Concern  . Not on file   Social History Narrative   Single, lives alone with pet dog (rescue dog)   Works as Information systems manager; drives; independent   Middle child between #2 sisters-his mother and sisters are very supportive     FAMILY HISTORY: Family History  Problem Relation Age of Onset  . Breast cancer Sister 57   . Hypertension Mother   . Hypertension Father   Maternal aunt breast cancer at age of 94 Maternal cousin ovarian cancer at age of 35 Maternal ancle prostate cancer age of 27   ALLERGIES:  has No Known Allergies.  MEDICATIONS:  Current Outpatient Prescriptions  Medication Sig Dispense Refill  . docusate sodium 100 MG CAPS Take 100 mg by mouth 2 (two) times daily. 10 capsule 0  . lidocaine-prilocaine (EMLA) cream Apply 1 application topically as needed. 30 g 2  . mirtazapine (REMERON) 15 MG tablet Take 1 tablet (15 mg total) by mouth at bedtime. 30 tablet 1  . morphine (MS CONTIN) 15 MG 12 hr tablet Take 1 tablet (15 mg total) by mouth every 12 (twelve) hours. (Patient not taking: Reported on 02/25/2015) 60 tablet 0  . ondansetron (ZOFRAN) 4 MG tablet Take 1 tablet (4 mg total) by mouth every 8 (eight) hours as needed for nausea or vomiting. 20 tablet 0  . pantoprazole (PROTONIX) 40 MG tablet Take 1 tablet (40 mg total) by mouth daily. 30 tablet 0  . prochlorperazine (COMPAZINE) 10 MG tablet Take 1 tablet (10 mg total) by mouth every 6 (six) hours as needed for nausea or vomiting. 30 tablet 3  . traMADol (ULTRAM) 50 MG tablet Take 1-2 tablets (50-100 mg total) by mouth  every 6 (six) hours as needed for moderate pain. (Patient not taking: Reported on 02/25/2015) 30 tablet 0   No current facility-administered medications for this visit.   Facility-Administered Medications Ordered in Other Visits  Medication Dose Route Frequency Provider Last Rate Last Dose  . fluorouracil (ADRUCIL) 5,700 mg in sodium chloride 0.9 % 136 mL chemo infusion  2,280 mg/m2 (Treatment Plan Actual) Intravenous 1 day or 1 dose Truitt Merle, MD      . leucovorin 996 mg in dextrose 5 % 250 mL infusion  400 mg/m2 (Treatment Plan Actual) Intravenous Once Truitt Merle, MD      . oxaliplatin (ELOXATIN) 200 mg in dextrose 5 % 500 mL  chemo infusion  80 mg/m2 (Treatment Plan Actual) Intravenous Once Truitt Merle, MD      . sodium chloride 0.9 % injection 10 mL  10 mL Intravenous PRN Truitt Merle, MD   10 mL at 12/31/14 0900    REVIEW OF SYSTEMS:   Constitutional: Denies fevers, chills or abnormal night sweats, no recent weight loss Eyes: Denies blurriness of vision, double vision or watery eyes Ears, nose, mouth, throat, and face: Denies mucositis or sore throat Respiratory: Denies cough, dyspnea or wheezes Cardiovascular: Denies palpitation, chest discomfort or lower extremity swelling Gastrointestinal:  Less nausea, abdominal bloating and discomfort Skin: Denies abnormal skin rashes Lymphatics: Denies new lymphadenopathy or easy bruising Neurological:Denies numbness, tingling or new weaknesses Behavioral/Psych: Mood is stable, no new changes  All other systems were reviewed with the patient and are negative except those mentioned in the history.  PHYSICAL EXAMINATION: ECOG PERFORMANCE STATUS: 1 GENERAL:alert, no distress and comfortable SKIN: skin color, texture, turgor are normal, no rashes or significant lesions EYES: normal, conjunctiva are pink and non-injected, sclera clear OROPHARYNX:no exudate, no erythema and lips, buccal mucosa, and tongue normal  NECK: supple, thyroid normal size, non-tender, without nodularity LYMPH:  no palpable lymphadenopathy in the cervical, axillary or inguinal LUNGS: clear to auscultation and percussion with normal breathing effort HEART: regular rate & rhythm and no murmurs and no lower extremity edema ABDOMEN:abdomen soft, non-tender and normal bowel sounds, probable small amount of ascites. Musculoskeletal:no cyanosis of digits and no clubbing  PSYCH: alert & oriented x 3 with fluent speech NEURO: no focal motor/sensory deficits  LABORATORY DATA:  I have reviewed the data as listed CBC Latest Ref Rng 03/11/2015 02/25/2015 02/11/2015  WBC 4.0 - 10.3 10e3/uL 4.0 4.4 4.0  Hemoglobin  13.0 - 17.1 g/dL 12.4(L) 11.9(L) 12.4(L)  Hematocrit 38.4 - 49.9 % 38.1(L) 36.4(L) 37.6(L)  Platelets 140 - 400 10e3/uL 215 185 172      CMP Latest Ref Rng 03/11/2015 02/25/2015 02/11/2015  Glucose 70 - 140 mg/dl 92 93 110  BUN 7.0 - 26.0 mg/dL 16.9 15.7 15.8  Creatinine 0.7 - 1.3 mg/dL 0.7 0.8 0.8  Sodium 136 - 145 mEq/L 140 141 139  Potassium 3.5 - 5.1 mEq/L 4.0 3.7 4.0  Chloride 96 - 112 mEq/L - - -  CO2 22 - 29 mEq/L 25 25 27   Calcium 8.4 - 10.4 mg/dL 8.7 8.7 8.8  Total Protein 6.4 - 8.3 g/dL 6.6 6.9 6.8  Total Bilirubin 0.20 - 1.20 mg/dL 0.57 0.67 0.54  Alkaline Phos 40 - 150 U/L 140 138 152(H)  AST 5 - 34 U/L 29 26 30   ALT 0 - 55 U/L 17 16 19    CEA  Status: Finalresult Visible to patient:  MyChart Nextappt: 03/13/2015 at 02:00 PM in Oncology ([facility] J32 DNS) Dx:  Gastric adenocarcinoma:  Stage IV           Ref Range 2wk ago  4wk ago  21moago     CEA 0.0 - 5.0 ng/mL 3.9 3.6 1.8         Pathology report  Diagnosis  06/07/2014 PERITONEAL/ASCITIC FLUID(SPECIMEN 1 OF 1 COLLECTED 04/26/14): METASTATIC ADENOCARCINOMA WITH SIGNET RING CELL FEATURES.  Stomach, biopsy, r/o cancer 04/25/14 - ADENOCARCINOMA WITH SIGNET RING FEATURES. Microscopic Comment The results were called to Dr. JArdis Hughson 04/26/2014. (JDP:kh 04/26/14) JClaudette LawsMD Pathologist, Electronic Signature (Case signed 04/26/2014)  HER-2/NEU BY CISH - NO AMPLIFICATION OF HER-2 DETECTED. RESULT RATIO OF HER2: CEP 17 SIGNALS 1.20 AVERAGE HER2 COPY NUMBER PER CELL 2.10  ADDITIONAL INFORMATION: Mismatch Repair (MMR) Protein Immunohistochemistry (IHC) IHC Expression Result: MLH1: Preserved nuclear expression (greater 50% tumor expression) MSH2: Preserved nuclear expression (greater 50% tumor expression) MSH6: Preserved nuclear expression (greater 50% tumor expression) PMS2: Preserved nuclear expression (greater 50% tumor expression) * Internal control demonstrates intact nuclear  expression Interpretation: NORMAL There is preserved expression of the major and minor MMR proteins. There is a very low probability that microsatellite instability (MSI) is present. However, certain clinically significant MMR protein mutations may result in preservation of nuclear expression. It is recommended that the preservation of protein expression be correlated with molecular based MSI testing.  [employer]: (+) [id number] NF1   RADIOGRAPHIC STUDIES: I have personally reviewed the radiological images as listed and agreed with the findings in the report.  Ct Chest, abdomen and pelvis Wo Contrast12/24/2015    IMPRESSION:  1. Findings, as above, highly concerning for either esophageal or gastric primary malignancy, with metastatic lymphadenopathy in the upper abdomen and retroperitoneum, widespread intraperitoneal spread of disease, malignant ascites, and multiple pulmonary nodules in the visualize lung bases concerning for pulmonary metastasis. Oncologic evaluation is recommended.  2. Additional incidental findings, as above. These results were called by telephone at the time of interpretation on 04/23/2014 at 7:53 pm to [place], who verbally acknowledged these results.   Electronically Signed   By: DVinnie LangtonM.D.   On: 04/23/2014 19:56   CT CAP 06/24/2014 IMPRESSION: 1. Progressive low density and calcifications within the extensive upper abdominal lymphadenopathy, likely related to partial treatment. Some of the lymph nodes do appear slightly larger. The irregular infiltrating mass involving the gastric cardia appears slightly improved. 2. No evidence of hepatic metastatic disease. 3. Overall volume of ascites has improved, although extensive changes of peritoneal carcinomatosis have not significantly changed. 4. No significant change in bilateral pulmonary nodules consistent with metastatic disease. 5. Resolution of bilateral pleural  effusions.  CT chest, abdomen and pelvis with contrast on 08/26/2014 IMPRESSION: Overall stable to slight progression of disease. Existing pulmonary nodules are stable to slightly progressed with no new pulmonary nodules evident. The bulky upper abdominal lymphadenopathy is stable to minimally progressed while no substantial change in the primary infiltrating gastric neoplasm is discernible. Omental disease appears stable. There is slightly more ascites on today's study.  CT chest, abdomen and pelvis with contrast on 10/21/2014 IMPRESSION: 1. Compared to the prior examination there appears to be slight progression of disease predominantly manifest by worsening gastric mucosal thickening, particularly along the greater curvature of the stomach in the region of the cardia and fundus, with increasing adjacent mesenteric/omental disease and slight increase in malignant ascites. Previously described upper abdominal lymphadenopathy is generally stable to slightly decreased compared to the prior examination, and previously noted pulmonary nodules are unchanged.  2. Additional incidental findings, as above.  CT chest, abdomen and pelvis with contrast, 12/11/2014 at [facility] Impression: #1 not much interval change into gastroesophageal junction mass next #2 number change in the lymph node, lung and peritoneal metastatic disease #3 small amount of intraperitoneal flus is slightly increased.   EGD WITH BIOPSY 04/25/2014 ENDOSCOPIC IMPRESSION: There was a large, nearly circumferential, ulcerated, clearly malignant mass that stradles the GE junction. The vast bulk of the mass lays in the stomach, occupying about 1/3 of the stomach. The mass extends into the esophagus for about 4cm above the GE junction. Mutiple biopsies were taken from the gastric portion of the mass   I ASSESSMENT & PLAN:  39 year old gentleman without significant past medical history presents with abdominal bloating,  nausea, vomiting, diarrhea, anemia and weight loss. EGD showed a large mass arising from the gastric fundus and extending into GE junction, biopsy showed adenocarcinoma with signet ring features, CT scan is consistent with peritoneal carcinomatosis and bilateral lung metastasis. Ascites cytology was positive for malignant cells.  1. 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 HER-2 negative, MMR-normal -I discussed his surgical biopsy results, cytology results, skin findings with patient and his family members, including his parents and 2 sisters. Unfortunately, this is widely metastatic, not curable, and overall prognosis is very poor. Literature has showed gastric adenocarcinoma with signet ring features has overall worse outcome comparing to others. His life expectancy is probably less than 6 months without treatment. -He has been on first-line chemotherapy FOLFOX for 10-11 months now, tolerating very well, and his symptoms have much  improved. -His tumor is negative for HER-2 overexpression, no role for trastuzumab. -We also checked his tumor for MMR, which was normal so he would unlikely benefit from immunotherapy.,  -[facility] genomic test reviewed no actionable mutation -His first restaging CT scan showed mild partial response, overall stable disease. His ascites has significantly decreased. Pleural effusion resolved. He is clinically doing well.  -I discussed his third restaging CT findings with patient and his father. It seems he had a mixed response, slight increase of the gastric wall thickness and the ascites, abdominal lymph nodes are smaller, overall I consider as stable disease or slight disease progression.  -His tumor marker CEA has came down to normal. He is clinically doing well, I recommended continuing FOLFOX. -due to his family history of malignancy, he has had genetic testing at [facility]. It was negative for  the comprehensive cancer panel, which included APC, BRCA 1/2, CDE H1, MLH1, MSH2, MSH6, PMS2, etc. -He was seen at [facility] in 12/2014, clinical trial options were exported, he underwent further genetic testing, and the treatment recommendation was to continue current chemotherapy giving the stable disease. He is scheduled to return for follow-up and the CT scan on 03/20/2015 -He developed worsening ascites last week, had 5.5 L ascites removed. I'm concerned he might have had disease progression. -He is going to [facility] on 11/10 for restaging CT scans and discuss clinical trial options. He will bring a copy of the scans back. -I encouraged him to participate in clinical trials if he has disease progression, or, back for second line chemotherapy, likely FOLFIRI.   2. Recurrent malignant ascites -s/p paracentesis at the beginning of his diagnosis, and again on 03/10/15  -We'll monitor closely from now on  3. Anemia, multifactorial (iron deficient anemia, cancer related and chemotherapy induced ) -His ferritin level was normal at 171, but he  has low iron and saturation, likely some degree of iron deficient anemia. -He has received IV Feraheme 510 mg twice, 2 weeks apart -Blood transfusion as needed during chemotherapy if her hemoglobin below 8. -Mild anemia has been stable.   Plan #1 cycle 23 FOLFOX today,  Same dose as before, no Neulasta on day 3 with this cycle  #2 RTC in 2 weeks for cycle 24, and I will see him before chemo  #3 follow up and repeat CT scan at [facility] on 11/10   The patient knows to call the clinic with any problems, questions or concerns.  I spent 20 minutes counseling the patient face to face. The total time spent in the appointment was 25  minutes and more than 50% was on counseling.     Truitt Merle, MD 03/11/2015

## 2015-03-11 NOTE — Patient Instructions (Signed)
[facility] Discharge Instructions for Patients Receiving Chemotherapy  Today you received the following chemotherapy agents: Oxaliplatin, Leucovorin, Adrucil.   To help prevent nausea and vomiting after your treatment, we encourage you to take your nausea medication as directed.    If you develop nausea and vomiting that is not controlled by your nausea medication, call the clinic.   BELOW ARE SYMPTOMS THAT SHOULD BE REPORTED IMMEDIATELY:  *FEVER GREATER THAN 100.5 F  *CHILLS WITH OR WITHOUT FEVER  NAUSEA AND VOMITING THAT IS NOT CONTROLLED WITH YOUR NAUSEA MEDICATION  *UNUSUAL SHORTNESS OF BREATH  *UNUSUAL BRUISING OR BLEEDING  TENDERNESS IN MOUTH AND THROAT WITH OR WITHOUT PRESENCE OF ULCERS  *URINARY PROBLEMS  *BOWEL PROBLEMS  UNUSUAL RASH Items with * indicate a potential emergency and should be followed up as soon as possible.  Feel free to call the clinic you have any questions or concerns. The clinic phone number is [phone].  Please show the CHEMO ALERT CARD at check-in to the Emergency Department and triage nurse.   

## 2015-03-13 ENCOUNTER — Ambulatory Visit (HOSPITAL_BASED_OUTPATIENT_CLINIC_OR_DEPARTMENT_OTHER): Payer: BLUE CROSS/BLUE SHIELD

## 2015-03-13 DIAGNOSIS — C786 Secondary malignant neoplasm of retroperitoneum and peritoneum: Secondary | ICD-10-CM

## 2015-03-13 DIAGNOSIS — C7802 Secondary malignant neoplasm of left lung: Secondary | ICD-10-CM

## 2015-03-13 DIAGNOSIS — C7801 Secondary malignant neoplasm of right lung: Secondary | ICD-10-CM

## 2015-03-13 DIAGNOSIS — C169 Malignant neoplasm of stomach, unspecified: Secondary | ICD-10-CM | POA: Diagnosis not present

## 2015-03-13 MED ORDER — HEPARIN SOD (PORK) LOCK FLUSH 100 UNIT/ML IV SOLN
500.0000 [IU] | Freq: Once | INTRAVENOUS | Status: AC | PRN
  Administered 2015-03-13: 500 [IU]
  Filled 2015-03-13: qty 5

## 2015-03-13 MED ORDER — SODIUM CHLORIDE 0.9 % IJ SOLN
10.0000 mL | INTRAMUSCULAR | Status: DC | PRN
  Administered 2015-03-13: 10 mL
  Filled 2015-03-13: qty 10

## 2015-03-21 ENCOUNTER — Encounter: Payer: Self-pay | Admitting: Hematology

## 2015-03-24 ENCOUNTER — Ambulatory Visit (HOSPITAL_COMMUNITY)
Admission: RE | Admit: 2015-03-24 | Discharge: 2015-03-24 | Disposition: A | Discharge status: Home / Self Care | Payer: BLUE CROSS/BLUE SHIELD | Source: Ambulatory Visit | Attending: Hematology | Admitting: Hematology

## 2015-03-24 ENCOUNTER — Telehealth: Payer: Self-pay | Admitting: *Deleted

## 2015-03-24 ENCOUNTER — Other Ambulatory Visit: Payer: Self-pay | Admitting: Hematology

## 2015-03-24 DIAGNOSIS — R188 Other ascites: Secondary | ICD-10-CM | POA: Diagnosis not present

## 2015-03-24 DIAGNOSIS — C169 Malignant neoplasm of stomach, unspecified: Secondary | ICD-10-CM | POA: Diagnosis not present

## 2015-03-24 MED ORDER — LIDOCAINE HCL (PF) 1 % IJ SOLN
INTRAMUSCULAR | Status: AC
  Filled 2015-03-24: qty 10

## 2015-03-24 NOTE — Telephone Encounter (Signed)
Discussed with Dr Burr Medico & able to get pt scheduled for paracentesis at [facility]/US today @ 2 pm.  Notified pt & Dr Burr Medico placed order.

## 2015-03-24 NOTE — Telephone Encounter (Signed)
Received call from pt stating that he would like to have a paracentesis today.  Will discuss with Dr Burr Medico.

## 2015-03-24 NOTE — Procedures (Signed)
   US guided paracentesis 5 liter max per MD  RLQ para Without complication  Tolerated well

## 2015-03-25 ENCOUNTER — Telehealth: Payer: Self-pay | Admitting: Hematology

## 2015-03-25 ENCOUNTER — Ambulatory Visit: Payer: BLUE CROSS/BLUE SHIELD

## 2015-03-25 ENCOUNTER — Other Ambulatory Visit (HOSPITAL_BASED_OUTPATIENT_CLINIC_OR_DEPARTMENT_OTHER): Payer: BLUE CROSS/BLUE SHIELD

## 2015-03-25 ENCOUNTER — Encounter: Payer: Self-pay | Admitting: Hematology

## 2015-03-25 ENCOUNTER — Ambulatory Visit (HOSPITAL_BASED_OUTPATIENT_CLINIC_OR_DEPARTMENT_OTHER): Payer: BLUE CROSS/BLUE SHIELD | Admitting: Hematology

## 2015-03-25 ENCOUNTER — Ambulatory Visit (HOSPITAL_BASED_OUTPATIENT_CLINIC_OR_DEPARTMENT_OTHER): Payer: BLUE CROSS/BLUE SHIELD

## 2015-03-25 VITALS — BP 130/93 | HR 76 | Temp 98.0°F | Resp 20 | Ht 69.0 in | Wt 282.3 lb

## 2015-03-25 DIAGNOSIS — C7802 Secondary malignant neoplasm of left lung: Secondary | ICD-10-CM

## 2015-03-25 DIAGNOSIS — C7801 Secondary malignant neoplasm of right lung: Secondary | ICD-10-CM

## 2015-03-25 DIAGNOSIS — C786 Secondary malignant neoplasm of retroperitoneum and peritoneum: Secondary | ICD-10-CM

## 2015-03-25 DIAGNOSIS — D6481 Anemia due to antineoplastic chemotherapy: Secondary | ICD-10-CM

## 2015-03-25 DIAGNOSIS — Z5111 Encounter for antineoplastic chemotherapy: Secondary | ICD-10-CM | POA: Diagnosis not present

## 2015-03-25 DIAGNOSIS — D630 Anemia in neoplastic disease: Secondary | ICD-10-CM

## 2015-03-25 DIAGNOSIS — J910 Malignant pleural effusion: Secondary | ICD-10-CM

## 2015-03-25 DIAGNOSIS — C169 Malignant neoplasm of stomach, unspecified: Secondary | ICD-10-CM

## 2015-03-25 DIAGNOSIS — D509 Iron deficiency anemia, unspecified: Secondary | ICD-10-CM

## 2015-03-25 DIAGNOSIS — C801 Malignant (primary) neoplasm, unspecified: Secondary | ICD-10-CM

## 2015-03-25 LAB — CBC WITH DIFFERENTIAL/PLATELET
BASO%: 0.6 % (ref 0.0–2.0)
BASOS ABS: 0 10*3/uL (ref 0.0–0.1)
EOS%: 1.4 % (ref 0.0–7.0)
Eosinophils Absolute: 0.1 10*3/uL (ref 0.0–0.5)
HEMATOCRIT: 38.6 % (ref 38.4–49.9)
HGB: 12.8 g/dL — ABNORMAL LOW (ref 13.0–17.1)
LYMPH#: 1 10*3/uL (ref 0.9–3.3)
LYMPH%: 30.1 % (ref 14.0–49.0)
MCH: 29.8 pg (ref 27.2–33.4)
MCHC: 33.2 g/dL (ref 32.0–36.0)
MCV: 89.8 fL (ref 79.3–98.0)
MONO#: 0.8 10*3/uL (ref 0.1–0.9)
MONO%: 24.3 % — ABNORMAL HIGH (ref 0.0–14.0)
NEUT#: 1.5 10*3/uL (ref 1.5–6.5)
NEUT%: 43.6 % (ref 39.0–75.0)
PLATELETS: 168 10*3/uL (ref 140–400)
RBC: 4.3 10*6/uL (ref 4.20–5.82)
RDW: 15.1 % — ABNORMAL HIGH (ref 11.0–14.6)
WBC: 3.5 10*3/uL — ABNORMAL LOW (ref 4.0–10.3)

## 2015-03-25 LAB — COMPREHENSIVE METABOLIC PANEL (CC13)
ALT: 15 U/L (ref 0–55)
ANION GAP: 7 meq/L (ref 3–11)
AST: 29 U/L (ref 5–34)
Albumin: 2.2 g/dL — ABNORMAL LOW (ref 3.5–5.0)
Alkaline Phosphatase: 151 U/L — ABNORMAL HIGH (ref 40–150)
BUN: 16.1 mg/dL (ref 7.0–26.0)
CALCIUM: 8.7 mg/dL (ref 8.4–10.4)
CHLORIDE: 107 meq/L (ref 98–109)
CO2: 26 meq/L (ref 22–29)
Creatinine: 0.8 mg/dL (ref 0.7–1.3)
Glucose: 100 mg/dl (ref 70–140)
POTASSIUM: 4.2 meq/L (ref 3.5–5.1)
Sodium: 140 mEq/L (ref 136–145)
Total Bilirubin: 0.5 mg/dL (ref 0.20–1.20)
Total Protein: 6.4 g/dL (ref 6.4–8.3)

## 2015-03-25 LAB — CEA: CEA: 5.3 ng/mL — ABNORMAL HIGH (ref 0.0–5.0)

## 2015-03-25 MED ORDER — PROCHLORPERAZINE MALEATE 10 MG PO TABS
ORAL_TABLET | ORAL | Status: AC
  Filled 2015-03-25: qty 1

## 2015-03-25 MED ORDER — SODIUM CHLORIDE 0.9 % IV SOLN
Freq: Once | INTRAVENOUS | Status: AC
  Administered 2015-03-25: 11:00:00 via INTRAVENOUS
  Filled 2015-03-25: qty 8

## 2015-03-25 MED ORDER — LEUCOVORIN CALCIUM INJECTION 350 MG
400.0000 mg/m2 | Freq: Once | INTRAVENOUS | Status: AC
  Administered 2015-03-25: 996 mg via INTRAVENOUS
  Filled 2015-03-25: qty 49.8

## 2015-03-25 MED ORDER — SODIUM CHLORIDE 0.9 % IJ SOLN
10.0000 mL | INTRAMUSCULAR | Status: AC | PRN
  Filled 2015-03-25: qty 10

## 2015-03-25 MED ORDER — PROCHLORPERAZINE MALEATE 10 MG PO TABS
10.0000 mg | ORAL_TABLET | Freq: Once | ORAL | Status: AC
  Administered 2015-03-25: 10 mg via ORAL

## 2015-03-25 MED ORDER — IRINOTECAN HCL CHEMO INJECTION 100 MG/5ML
180.0000 mg/m2 | Freq: Once | INTRAVENOUS | Status: AC
  Administered 2015-03-25: 448 mg via INTRAVENOUS
  Filled 2015-03-25: qty 22.4

## 2015-03-25 MED ORDER — SODIUM CHLORIDE 0.9 % IV SOLN
Freq: Once | INTRAVENOUS | Status: AC
  Administered 2015-03-25: 10:00:00 via INTRAVENOUS

## 2015-03-25 MED ORDER — FLUOROURACIL CHEMO INJECTION 5 GM/100ML
2200.0000 mg/m2 | INTRAVENOUS | Status: DC
  Administered 2015-03-25: 5500 mg via INTRAVENOUS
  Filled 2015-03-25: qty 110

## 2015-03-25 MED ORDER — ATROPINE SULFATE 1 MG/ML IJ SOLN
INTRAMUSCULAR | Status: AC
  Filled 2015-03-25: qty 1

## 2015-03-25 NOTE — Telephone Encounter (Signed)
Gave and printed appt sched and avs fo rpt for NOV thru Jan 2017 °

## 2015-03-25 NOTE — Progress Notes (Signed)
[place]  [phone] Fax:(336) [phone]  Clinic follow-up Note   Patient Care Team: No Pcp Per Patient as PCP - General (General Practice) 03/25/2015  CHIEF COMPLAINTS Follow up metastatic gastric cancer    Gastric adenocarcinoma: Stage IV   04/23/2014 Imaging CT abdomen showed peritoneal carcinomatosis, ascites, thickning of gastric and GEJ wall, CT chest showed a few small b/l lung nodules.    04/24/2014 Tumor Marker CEA  5.6, CA19.9 13   04/25/2014 Initial Diagnosis Gastric adenocarcinoma: Stage IV   04/25/2014 Pathology Results Stomach mass biopsy showed - ADENOCARCINOMA WITH SIGNET RING FEATURES. ascites cytology (+)   04/25/2014 Procedure EGD: There was a large, nearly circumferential, ulcerated, clearly malignant mass that stradles the GE junction. The vast bulk of the mass lays in the stomach.    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 [facility] He is here for the first office visit after hospital discharge.  He was  admitted on 12/15 with progressive, 4 month history of dysphagia, epigastric abdominal discomfort and bloating, early satiation accompanied by nausea, and postprandial vomiting and diarrhea. He has experienced a 30 lbs weight loss in the process. He has noted increased abdominal girth. Patient denies any shortness of breath, chest pain, GI bleed, hematuria or lower extremity swelling. He denies heavy alcohol intake. Denies tobacco  abuse. Denies risk factors for HIV or hepatitis. He denies any family history of GI malignancies.  CT of the abdomen and pelvis with contrast on 12/15 revealed several pulmonary nodules at the bases bilaterally, profound thickening of the distal esophagus extending beyond the gastroesophageal junction into the proximal stomach,cardia and fundus of the stomach, most evident along the lesser curvature. No pathologic dilatation of small bowel or colon. Extensive upper abdominal and retroperitoneal lymphadenopathy was visualized. Moderate volume of ascites, presumably malignant, was noted. Extensive soft tissue nodularity throughout the omentum, compatible with omental caking and widespread peritoneal metastasis. Additionally, there are other areas of enhancing nodularity along the peritoneal surface. Tiny ventral and umbilical hernias containing predominantly omental fat, including some omental implants. There are no aggressive appearing lytic or blastic lesions noted in the visualized portions of the skeleton.  He underwent an EGD by Dr. JArdis Hughson 04/25/2014, which showed a large fungating mass that straddles the GE junction and occupying the upper 1/3 part of the stomach. Multiple biopsy was taken which showed adenocarcinoma with signet ring features. He also underwent a paracentesis with 4.5 L fluid is removed on 04/26/2014. Cytology was positive for adenocarcinoma with signet ring features, consistent with metastatic disease.  He was discharged to home on 04/27/2014. He did feel a slightly informant of his abdominal bloating after the paracentesis, but not much improvement other symptoms. He had Mediport placement and the second paracentesis was again 4 L fluids removal on 05/02/2014. His appetite is low, he drinks ensure 3 bottles a day, some soup and a very little other foods. His energy level is also low, but able to take care  of all his daily needs, and move around with about much difficulty. His  abdominal discomfort/pain are relatively controlled by morphine 15 mg at night and tramadol 2-3 times during the day. He denies any fever, chills, cough, chest pain or any in the other discomfort.  [employer] genetic test (0/98/1191): NF1 splice site [id number] del 47 TP53 R213*  INTERIM HISTORY: [place] returns for follow up.  He was seen by Dr. Percell Miller at [facility] last week, repeated a CT scan showed increased ascites, and thickening of the omentum, consistent with disease progression.  Second line chemotherapy FOLFIRI  Was recommended , clinical trial was not offered at this point.  He underwent repeated paracentesis yesterday with 5 L ascites removed. He didn't feel better afterwards. Is otherwise doing well,  Has good energy and appetite no other new complaints.  MEDICAL HISTORY:  Past Medical History  Diagnosis Date  . Medical history non-contributory   . gastric ca dx'd 04/2014    SURGICAL HISTORY: Past Surgical History  Procedure Laterality Date  . Squamous cell carcinoma excision  approx March 2015    removed from top of head  . Esophagogastroduodenoscopy (egd) with propofol N/A 04/25/2014    Procedure: ESOPHAGOGASTRODUODENOSCOPY (EGD) WITH PROPOFOL;  Surgeon: Milus Banister, MD;  Location: [facility];  Service: Endoscopy;  Laterality: N/A;    SOCIAL HISTORY: Social History   Social History  . Marital Status: Single    Spouse Name: N/A  . Number of Children: N/A  . Years of Education: N/A   Occupational History  . Not on file.   Social History Main Topics  . Smoking status: Never Smoker   . Smokeless tobacco: Never Used  . Alcohol Use: No  . Drug Use: No  . Sexual Activity: Not on file   Other Topics Concern  . Not on file   Social History Narrative   Single, lives alone with pet dog (rescue dog)   Works as Information systems manager; drives; independent   Middle child between #2 sisters-his mother and sisters are very supportive    FAMILY  HISTORY: Family History  Problem Relation Age of Onset  . Breast cancer Sister 62   . Hypertension Mother   . Hypertension Father   Maternal aunt breast cancer at age of 43 Maternal cousin ovarian cancer at age of 82 Maternal ancle prostate cancer age of 68   ALLERGIES:  has No Known Allergies.  MEDICATIONS:  Current Outpatient Prescriptions  Medication Sig Dispense Refill  . docusate sodium 100 MG CAPS Take 100 mg by mouth 2 (two) times daily. 10 capsule 0  . lidocaine-prilocaine (EMLA) cream Apply 1 application topically as needed. 30 g 2  . mirtazapine (REMERON) 15 MG tablet Take 1 tablet (15 mg total) by mouth at bedtime. 30 tablet 1  . morphine (MS CONTIN) 15 MG 12 hr tablet Take 1 tablet (15 mg total) by mouth every 12 (twelve) hours. (Patient not taking: Reported on 02/25/2015) 60 tablet 0  . ondansetron (ZOFRAN) 4 MG tablet Take 1 tablet (4 mg total) by mouth every 8 (eight) hours as needed for nausea or vomiting. 20 tablet 0  . pantoprazole (PROTONIX) 40 MG tablet Take 1 tablet (40 mg total) by mouth daily. 30 tablet 0  . prochlorperazine (COMPAZINE) 10 MG tablet Take 1 tablet (10 mg total) by mouth every 6 (six) hours as needed for nausea or vomiting. 30 tablet 3  . traMADol (ULTRAM) 50 MG tablet Take 1-2 tablets (50-100 mg total) by  mouth every 6 (six) hours as needed for moderate pain. (Patient not taking: Reported on 02/25/2015) 30 tablet 0   No current facility-administered medications for this visit.   Facility-Administered Medications Ordered in Other Visits  Medication Dose Route Frequency Provider Last Rate Last Dose  . sodium chloride 0.9 % injection 10 mL  10 mL Intravenous PRN Truitt Merle, MD   10 mL at 12/31/14 0900    REVIEW OF SYSTEMS:   Constitutional: Denies fevers, chills or abnormal night sweats, no recent weight loss Eyes: Denies blurriness of vision, double vision or watery eyes Ears, nose, mouth, throat, and face: Denies mucositis or sore  throat Respiratory: Denies cough, dyspnea or wheezes Cardiovascular: Denies palpitation, chest discomfort or lower extremity swelling Gastrointestinal:  Less nausea, abdominal bloating and discomfort Skin: Denies abnormal skin rashes Lymphatics: Denies new lymphadenopathy or easy bruising Neurological:Denies numbness, tingling or new weaknesses Behavioral/Psych: Mood is stable, no new changes  All other systems were reviewed with the patient and are negative except those mentioned in the history.  PHYSICAL EXAMINATION: ECOG PERFORMANCE STATUS: 1 GENERAL:alert, no distress and comfortable SKIN: skin color, texture, turgor are normal, no rashes or significant lesions EYES: normal, conjunctiva are pink and non-injected, sclera clear OROPHARYNX:no exudate, no erythema and lips, buccal mucosa, and tongue normal  NECK: supple, thyroid normal size, non-tender, without nodularity LYMPH:  no palpable lymphadenopathy in the cervical, axillary or inguinal LUNGS: clear to auscultation and percussion with normal breathing effort HEART: regular rate & rhythm and no murmurs and no lower extremity edema ABDOMEN:abdomen soft, non-tender and normal bowel sounds, probable small amount of ascites. Musculoskeletal:no cyanosis of digits and no clubbing  PSYCH: alert & oriented x 3 with fluent speech NEURO: no focal motor/sensory deficits  LABORATORY DATA:  I have reviewed the data as listed CBC Latest Ref Rng 03/11/2015 02/25/2015 02/11/2015  WBC 4.0 - 10.3 10e3/uL 4.0 4.4 4.0  Hemoglobin 13.0 - 17.1 g/dL 12.4(L) 11.9(L) 12.4(L)  Hematocrit 38.4 - 49.9 % 38.1(L) 36.4(L) 37.6(L)  Platelets 140 - 400 10e3/uL 215 185 172      CMP Latest Ref Rng 03/11/2015 02/25/2015 02/11/2015  Glucose 70 - 140 mg/dl 92 93 110  BUN 7.0 - 26.0 mg/dL 16.9 15.7 15.8  Creatinine 0.7 - 1.3 mg/dL 0.7 0.8 0.8  Sodium 136 - 145 mEq/L 140 141 139  Potassium 3.5 - 5.1 mEq/L 4.0 3.7 4.0  Chloride 96 - 112 mEq/L - - -  CO2 22 - 29  mEq/L _0 Calcium 8.4 - 10.4 mg/dL 8.7 8.7 8.8  Total Protein 6.4 - 8.3 g/dL 6.6 6.9 6.8  Total Bilirubin 0.20 - 1.20 mg/dL 0.57 0.67 0.54  Alkaline Phos 40 - 150 U/L 140 138 152(H)  AST 5 - 34 U/L _1 ALT 0 - 55 U/L _2 CEA  Status: Finalresult Visible to patient:  MyChart Nextappt: 03/13/2015 at 02:00 PM in Oncology ([facility] J32 DNS) Dx:  Gastric adenocarcinoma: Stage IV           Ref Range 2wk ago  4wk ago  27moago     CEA 0.0 - 5.0 ng/mL 3.9 3.6 1.8         Pathology report  Diagnosis  06/07/2014 PERITONEAL/ASCITIC FLUID(SPECIMEN 1 OF 1 COLLECTED 04/26/14): METASTATIC ADENOCARCINOMA WITH SIGNET RING CELL FEATURES.  Stomach, biopsy, r/o cancer 04/25/14 - ADENOCARCINOMA WITH SIGNET RING FEATURES. Microscopic Comment The results were called to Dr. JArdis Hughson 04/26/2014. (JDP:kh 04/26/14) JClaudette Laws  MD Pathologist, Electronic Signature (Case signed 04/26/2014)  HER-2/NEU BY CISH - NO AMPLIFICATION OF HER-2 DETECTED. RESULT RATIO OF HER2: CEP 17 SIGNALS 1.20 AVERAGE HER2 COPY NUMBER PER CELL 2.10  ADDITIONAL INFORMATION: Mismatch Repair (MMR) Protein Immunohistochemistry (IHC) IHC Expression Result: MLH1: Preserved nuclear expression (greater 50% tumor expression) MSH2: Preserved nuclear expression (greater 50% tumor expression) MSH6: Preserved nuclear expression (greater 50% tumor expression) PMS2: Preserved nuclear expression (greater 50% tumor expression) * Internal control demonstrates intact nuclear expression Interpretation: NORMAL There is preserved expression of the major and minor MMR proteins. There is a very low probability that microsatellite instability (MSI) is present. However, certain clinically significant MMR protein mutations may result in preservation of nuclear expression. It is recommended that the preservation of protein expression be correlated with molecular based MSI testing.  [facility]  [employer] testing: (+) QM25 and NF1   RADIOGRAPHIC STUDIES: I have personally reviewed the radiological images as listed and agreed with the findings in the report.  Ct Chest, abdomen and pelvis Wo Contrast12/24/2015    IMPRESSION:  1. Findings, as above, highly concerning for either esophageal or gastric primary malignancy, with metastatic lymphadenopathy in the upper abdomen and retroperitoneum, widespread intraperitoneal spread of disease, malignant ascites, and multiple pulmonary nodules in the visualize lung bases concerning for pulmonary metastasis. Oncologic evaluation is recommended.  2. Additional incidental findings, as above. These results were called by telephone at the time of interpretation on 04/23/2014 at 7:53 pm to [place] , who verbally acknowledged these results.   Electronically Signed   By: Vinnie Langton M.D.   On: 04/23/2014 19:56   CT CAP 06/24/2014 IMPRESSION: 1. Progressive low density and calcifications within the extensive upper abdominal lymphadenopathy, likely related to partial treatment. Some of the lymph nodes do appear slightly larger. The irregular infiltrating mass involving the gastric cardia appears slightly improved. 2. No evidence of hepatic metastatic disease. 3. Overall volume of ascites has improved, although extensive changes of peritoneal carcinomatosis have not significantly changed. 4. No significant change in bilateral pulmonary nodules consistent with metastatic disease. 5. Resolution of bilateral pleural effusions.  CT chest, abdomen and pelvis with contrast on 08/26/2014 IMPRESSION: Overall stable to slight progression of disease. Existing pulmonary nodules are stable to slightly progressed with no new pulmonary nodules evident. The bulky upper abdominal lymphadenopathy is stable to minimally progressed while no substantial change in the primary infiltrating gastric neoplasm is discernible. Omental disease appears stable.  There is slightly more ascites on today's study.  CT chest, abdomen and pelvis with contrast on 10/21/2014 IMPRESSION: 1. Compared to the prior examination there appears to be slight progression of disease predominantly manifest by worsening gastric mucosal thickening, particularly along the greater curvature of the stomach in the region of the cardia and fundus, with increasing adjacent mesenteric/omental disease and slight increase in malignant ascites. Previously described upper abdominal lymphadenopathy is generally stable to slightly decreased compared to the prior examination, and previously noted pulmonary nodules are unchanged. 2. Additional incidental findings, as above.  CT chest, abdomen and pelvis with contrast, 03/20/2015 at [facility] IMPRESSION: The abdominal pelvic ascites has increased. Infiltration of the omentum has increased likely representing increasing peritoneal disease.  Some of the left gastric adenopathy is a few millimeters smaller.  No real change in the metastatic pulmonary nodules.   EGD WITH BIOPSY 04/25/2014 ENDOSCOPIC IMPRESSION: There was a large, nearly circumferential, ulcerated, clearly malignant mass that stradles the GE junction. The vast bulk of the mass lays in the  stomach, occupying about 1/3 of the stomach. The mass extends into the esophagus for about 4cm above the GE junction. Mutiple biopsies were taken from the gastric portion of the mass   I ASSESSMENT & PLAN:  39 year old gentleman without significant past medical history presents with abdominal bloating, nausea, vomiting, diarrhea, anemia and weight loss. EGD showed a large mass arising from the gastric fundus and extending into GE junction, biopsy showed adenocarcinoma with signet ring features, CT scan is consistent with peritoneal carcinomatosis and bilateral lung metastasis. Ascites cytology was positive for malignant cells.  1. Stage IV gastric adenocarcinoma with  metastasis to peritoneum and lungs, with malignant ascites. HER-2 negative, MMR-normal -I discussed his surgical biopsy results, cytology results, skin findings with patient and his family members, including his parents and 2 sisters. Unfortunately, this is widely metastatic, not curable, and overall prognosis is very poor. Literature has showed gastric adenocarcinoma with signet ring features has overall worse outcome comparing to others. His life expectancy is probably less than 6 months without treatment. -He has been on first-line chemotherapy FOLFOX for 10-11 months now, tolerating very well, and his symptoms have much  improved. -His tumor is negative for HER-2 overexpression, no role for trastuzumab. -We also checked his tumor for MMR, which was normal so he would unlikely benefit from immunotherapy.,  -[facility] genomic test reviewed no actionable mutation -His first restaging CT scan showed mild partial response, overall stable disease. His ascites has significantly decreased. Pleural effusion resolved. He is clinically doing well.  -I discussed his third restaging CT findings with patient and his father. It seems he had a mixed response, slight increase of the gastric wall thickness and the ascites, abdominal lymph nodes are smaller, overall I consider as stable disease or slight disease progression.  -His tumor marker CEA has came down to normal. He is clinically doing well, I recommended continuing FOLFOX. -due to his family history of malignancy, he has had genetic testing at [facility]. It was negative for the comprehensive cancer panel, which included APC, BRCA 1/2, CDE H1, MLH1, MSH2, MSH6, PMS2, etc. -He was seen at [facility]  Last week, CT scan showed disease progression. Second line chemotherapy FOLFIRI was  Recommended.  That would be my recommendation also.  --Chemotherapy consent: Side effects including but does not not limited to, fatigue, nausea, vomiting,  diarrhea, hair loss, neuropathy, fluid retention, renal and kidney dysfunction, neutropenic fever, needed for blood transfusion, bleeding, were discussed with patient in great detail. He agrees to proceed. - He will start first cycle FOLFIRI today  And continue every 2 weeks  2. Recurrent malignant ascites -s/p paracentesis at the beginning of his diagnosis, and again on 03/10/15 and 03/24/15 - I'll schedule him for repeated paracentesis in 2 weeks. We discussed the role of Pleurx  If his ascites does not respond to second line chemotherapy. -We'll monitor closely from now on  3. Anemia, multifactorial (iron deficient anemia, cancer related and chemotherapy induced ) -His ferritin level was normal at 171, but he has low iron and saturation, likely some degree of iron deficient anemia. -He has received IV Feraheme 510 mg twice, 2 weeks apart -Blood transfusion as needed during chemotherapy if her hemoglobin below 8. -Mild anemia has been stable.   Plan -first cycle FOFIRI today  And continue every 2 weeks - I'll see him back in 2 weeks - I'll schedule him for repeated paracentesis on April 07 2015  The patient knows to call the clinic with  any problems, questions or concerns.  I spent 20 minutes counseling the patient face to face. The total time spent in the appointment was 25  minutes and more than 50% was on counseling.     Truitt Merle, MD 03/25/2015

## 2015-03-25 NOTE — Progress Notes (Signed)
1230 Patient c/o nausea and would like to try compazine.  He has had nausea in the past with zofran and treated it with compazine with success.  Discussed with Dr. Burr Medico.  VO given and read back for compazine 10mg  po.   Will try this first, but then may give him atropine if nausea continues. Patient had one episode of vomiting at end of camptosar.  He feels much better now.  He felt that this might be from patient across the hall having active vomiting. Did not use atropine.  Patient discharged and states he feels fine

## 2015-03-25 NOTE — Patient Instructions (Signed)
[facility] Discharge Instructions for Patients Receiving Chemotherapy  Today you received the following chemotherapy agents: Camptosar, Leucovorin, Adrucil   To help prevent nausea and vomiting after your treatment, we encourage you to take your nausea medication as directed.    If you develop nausea and vomiting that is not controlled by your nausea medication, call the clinic.   BELOW ARE SYMPTOMS THAT SHOULD BE REPORTED IMMEDIATELY:  *FEVER GREATER THAN 100.5 F  *CHILLS WITH OR WITHOUT FEVER  NAUSEA AND VOMITING THAT IS NOT CONTROLLED WITH YOUR NAUSEA MEDICATION  *UNUSUAL SHORTNESS OF BREATH  *UNUSUAL BRUISING OR BLEEDING  TENDERNESS IN MOUTH AND THROAT WITH OR WITHOUT PRESENCE OF ULCERS  *URINARY PROBLEMS  *BOWEL PROBLEMS  UNUSUAL RASH Items with * indicate a potential emergency and should be followed up as soon as possible.  Feel free to call the clinic you have any questions or concerns. The clinic phone number is [phone].  Please show the [place] [facility] at check-in to the Emergency Department and triage nurse.  Irinotecan injection(Camptosar) What is this medicine? IRINOTECAN (ir in oh TEE kan ) is a chemotherapy drug. It is used to treat colon and rectal cancer. This medicine may be used for other purposes; ask your health care provider or pharmacist if you have questions. What should I tell my health care provider before I take this medicine? They need to know if you have any of these conditions: -blood disorders -dehydration -diarrhea -infection (especially a virus infection such as chickenpox, cold sores, or herpes) -liver disease -low blood counts, like low white cell, platelet, or red cell counts -recent or ongoing radiation therapy -an unusual or allergic reaction to irinotecan, sorbitol, other chemotherapy, other medicines, foods, dyes, or preservatives -pregnant or trying to get pregnant -breast-feeding How should I use this  medicine? This drug is given as an infusion into a vein. It is administered in a hospital or clinic by a specially trained health care professional. Talk to your pediatrician regarding the use of this medicine in children. Special care may be needed. Overdosage: If you think you have taken too much of this medicine contact a poison control center or emergency room at once. NOTE: This medicine is only for you. Do not share this medicine with others. What if I miss a dose? It is important not to miss your dose. Call your doctor or health care professional if you are unable to keep an appointment. What may interact with this medicine? Do not take this medicine with any of the following medications: -atazanavir -certain medicines for fungal infections like itraconazole and ketoconazole -St. John's Wort This medicine may also interact with the following medications: -dexamethasone -diuretics -laxatives -medicines for seizures like carbamazepine, mephobarbital, phenobarbital, phenytoin, primidone -medicines to increase blood counts like filgrastim, pegfilgrastim, sargramostim -prochlorperazine -vaccines This list may not describe all possible interactions. Give your health care provider a list of all the medicines, herbs, non-prescription drugs, or dietary supplements you use. Also tell them if you smoke, drink alcohol, or use illegal drugs. Some items may interact with your medicine. What should I watch for while using this medicine? Your condition will be monitored carefully while you are receiving this medicine. You will need important blood work done while you are taking this medicine. This drug may make you feel generally unwell. This is not uncommon, as chemotherapy can affect healthy cells as well as cancer cells. Report any side effects. Continue your course of treatment even though you feel ill  unless your doctor tells you to stop. In some cases, you may be given additional medicines to  help with side effects. Follow all directions for their use. You may get drowsy or dizzy. Do not drive, use machinery, or do anything that needs mental alertness until you know how this medicine affects you. Do not stand or sit up quickly, especially if you are an older patient. This reduces the risk of dizzy or fainting spells. Call your doctor or health care professional for advice if you get a fever, chills or sore throat, or other symptoms of a cold or flu. Do not treat yourself. This drug decreases your body's ability to fight infections. Try to avoid being around people who are sick. This medicine may increase your risk to bruise or bleed. Call your doctor or health care professional if you notice any unusual bleeding. Be careful brushing and flossing your teeth or using a toothpick because you may get an infection or bleed more easily. If you have any dental work done, tell your dentist you are receiving this medicine. Avoid taking products that contain aspirin, acetaminophen, ibuprofen, naproxen, or ketoprofen unless instructed by your doctor. These medicines may hide a fever. Do not become pregnant while taking this medicine. Women should inform their doctor if they wish to become pregnant or think they might be pregnant. There is a potential for serious side effects to an unborn child. Talk to your health care professional or pharmacist for more information. Do not breast-feed an infant while taking this medicine. What side effects may I notice from receiving this medicine? Side effects that you should report to your doctor or health care professional as soon as possible: -allergic reactions like skin rash, itching or hives, swelling of the face, lips, or tongue -low blood counts - this medicine may decrease the number of white blood cells, red blood cells and platelets. You may be at increased risk for infections and bleeding. -signs of infection - fever or chills, cough, sore throat, pain or  difficulty passing urine -signs of decreased platelets or bleeding - bruising, pinpoint red spots on the skin, black, tarry stools, blood in the urine -signs of decreased red blood cells - unusually weak or tired, fainting spells, lightheadedness -breathing problems -chest pain -diarrhea -feeling faint or lightheaded, falls -flushing, runny nose, sweating during infusion -mouth sores or pain -pain, swelling, redness or irritation where injected -pain, swelling, warmth in the leg -pain, tingling, numbness in the hands or feet -problems with balance, talking, walking -stomach cramps, pain -trouble passing urine or change in the amount of urine -vomiting as to be unable to hold down drinks or food -yellowing of the eyes or skin Side effects that usually do not require medical attention (report to your doctor or health care professional if they continue or are bothersome): -constipation -hair loss -headache -loss of appetite -nausea, vomiting -stomach upset This list may not describe all possible side effects. Call your doctor for medical advice about side effects. You may report side effects to FDA at 1-800-FDA-1088. Where should I keep my medicine? This drug is given in a hospital or clinic and will not be stored at home. NOTE: This sheet is a summary. It may not cover all possible information. If you have questions about this medicine, talk to your doctor, pharmacist, or health care provider.    2016, [employer]. (2012-10-23 16:29:32)

## 2015-03-26 ENCOUNTER — Telehealth: Payer: Self-pay | Admitting: *Deleted

## 2015-03-26 NOTE — Telephone Encounter (Signed)
Called pt to discuss how his chemo went yesterday with change to camptosar.  He states he is doing OK & no symtoms.  He denies any cramping or diarrhea.  Discussed this S.E. & informed to let us know if he has any & instructed to take imodium AD two tabs to start with if this occurs & to let us know for further instructions.  He denies any other needs & knows to call if any problems.

## 2015-03-26 NOTE — Telephone Encounter (Signed)
-----   Message from Ignacia Felling, RN sent at 03/25/2015 10:33 AM EST ----- Regarding: Dr. Sunday Corn follow up 1st [facility]   Dr. Burr Medico  Patient phone  [phone]

## 2015-03-27 ENCOUNTER — Ambulatory Visit (HOSPITAL_BASED_OUTPATIENT_CLINIC_OR_DEPARTMENT_OTHER): Payer: BLUE CROSS/BLUE SHIELD

## 2015-03-27 VITALS — BP 129/67 | HR 58 | Temp 97.7°F | Resp 18

## 2015-03-27 DIAGNOSIS — Z452 Encounter for adjustment and management of vascular access device: Secondary | ICD-10-CM | POA: Diagnosis not present

## 2015-03-27 DIAGNOSIS — C169 Malignant neoplasm of stomach, unspecified: Secondary | ICD-10-CM | POA: Diagnosis not present

## 2015-03-27 MED ORDER — HEPARIN SOD (PORK) LOCK FLUSH 100 UNIT/ML IV SOLN
500.0000 [IU] | Freq: Once | INTRAVENOUS | Status: AC | PRN
  Administered 2015-03-27: 500 [IU]
  Filled 2015-03-27: qty 5

## 2015-03-27 MED ORDER — SODIUM CHLORIDE 0.9 % IJ SOLN
10.0000 mL | INTRAMUSCULAR | Status: DC | PRN
  Administered 2015-03-27: 10 mL
  Filled 2015-03-27: qty 10

## 2015-03-27 NOTE — Patient Instructions (Signed)

## 2015-03-28 ENCOUNTER — Encounter: Payer: Self-pay | Admitting: Hematology

## 2015-03-28 ENCOUNTER — Ambulatory Visit (HOSPITAL_COMMUNITY): Payer: BLUE CROSS/BLUE SHIELD

## 2015-03-31 ENCOUNTER — Telehealth: Payer: Self-pay | Admitting: *Deleted

## 2015-03-31 NOTE — Telephone Encounter (Signed)
Spoke with pt and informed pt re:  Paracentesis is scheduled for 1030 am on Wed 04/02/15.   Instructed pt to arrive by 1015 am to register.  Pt voiced understanding.

## 2015-04-02 ENCOUNTER — Ambulatory Visit (HOSPITAL_COMMUNITY)
Admission: RE | Admit: 2015-04-02 | Discharge: 2015-04-02 | Disposition: A | Discharge status: Home / Self Care | Payer: BLUE CROSS/BLUE SHIELD | Source: Ambulatory Visit | Attending: Hematology | Admitting: Hematology

## 2015-04-02 DIAGNOSIS — R188 Other ascites: Secondary | ICD-10-CM | POA: Insufficient documentation

## 2015-04-02 DIAGNOSIS — C169 Malignant neoplasm of stomach, unspecified: Secondary | ICD-10-CM | POA: Diagnosis not present

## 2015-04-02 NOTE — Procedures (Signed)
Successful US guided paracentesis from [facility].  Yielded 4.8 liters of serous fluid.  No immediate complications.  Pt tolerated well.   Specimen was not sent for labs.  Tsosie Billing D PA-C 04/02/2015 11:52 AM

## 2015-04-07 ENCOUNTER — Ambulatory Visit (HOSPITAL_COMMUNITY): Payer: BLUE CROSS/BLUE SHIELD

## 2015-04-08 ENCOUNTER — Ambulatory Visit (HOSPITAL_BASED_OUTPATIENT_CLINIC_OR_DEPARTMENT_OTHER): Payer: BLUE CROSS/BLUE SHIELD | Admitting: Hematology

## 2015-04-08 ENCOUNTER — Encounter: Payer: Self-pay | Admitting: Hematology

## 2015-04-08 ENCOUNTER — Telehealth: Payer: Self-pay | Admitting: *Deleted

## 2015-04-08 ENCOUNTER — Ambulatory Visit: Payer: BLUE CROSS/BLUE SHIELD

## 2015-04-08 ENCOUNTER — Telehealth: Payer: Self-pay | Admitting: Hematology

## 2015-04-08 ENCOUNTER — Other Ambulatory Visit (HOSPITAL_BASED_OUTPATIENT_CLINIC_OR_DEPARTMENT_OTHER): Payer: BLUE CROSS/BLUE SHIELD

## 2015-04-08 VITALS — BP 111/72 | HR 70 | Temp 98.0°F | Resp 18 | Ht 66.0 in | Wt 289.1 lb

## 2015-04-08 DIAGNOSIS — C7801 Secondary malignant neoplasm of right lung: Secondary | ICD-10-CM

## 2015-04-08 DIAGNOSIS — D500 Iron deficiency anemia secondary to blood loss (chronic): Secondary | ICD-10-CM

## 2015-04-08 DIAGNOSIS — C169 Malignant neoplasm of stomach, unspecified: Secondary | ICD-10-CM | POA: Diagnosis not present

## 2015-04-08 DIAGNOSIS — C7802 Secondary malignant neoplasm of left lung: Secondary | ICD-10-CM | POA: Diagnosis not present

## 2015-04-08 DIAGNOSIS — C786 Secondary malignant neoplasm of retroperitoneum and peritoneum: Secondary | ICD-10-CM | POA: Diagnosis not present

## 2015-04-08 DIAGNOSIS — C801 Malignant (primary) neoplasm, unspecified: Secondary | ICD-10-CM

## 2015-04-08 DIAGNOSIS — R180 Malignant ascites: Secondary | ICD-10-CM

## 2015-04-08 DIAGNOSIS — D6481 Anemia due to antineoplastic chemotherapy: Secondary | ICD-10-CM

## 2015-04-08 DIAGNOSIS — D630 Anemia in neoplastic disease: Secondary | ICD-10-CM

## 2015-04-08 LAB — CBC WITH DIFFERENTIAL/PLATELET
BASO%: 0.8 % (ref 0.0–2.0)
Basophils Absolute: 0 10*3/uL (ref 0.0–0.1)
EOS%: 2.7 % (ref 0.0–7.0)
Eosinophils Absolute: 0 10*3/uL (ref 0.0–0.5)
HEMATOCRIT: 37.5 % — AB (ref 38.4–49.9)
HEMOGLOBIN: 11.9 g/dL — AB (ref 13.0–17.1)
LYMPH#: 0.9 10*3/uL (ref 0.9–3.3)
LYMPH%: 54.9 % — ABNORMAL HIGH (ref 14.0–49.0)
MCH: 28.6 pg (ref 27.2–33.4)
MCHC: 31.8 g/dL — AB (ref 32.0–36.0)
MCV: 89.9 fL (ref 79.3–98.0)
MONO#: 0.4 10*3/uL (ref 0.1–0.9)
MONO%: 21.9 % — ABNORMAL HIGH (ref 0.0–14.0)
NEUT#: 0.3 10*3/uL — CL (ref 1.5–6.5)
NEUT%: 19.7 % — ABNORMAL LOW (ref 39.0–75.0)
Platelets: 167 10*3/uL (ref 140–400)
RBC: 4.17 10*6/uL — ABNORMAL LOW (ref 4.20–5.82)
RDW: 16 % — AB (ref 11.0–14.6)
WBC: 1.7 10*3/uL — AB (ref 4.0–10.3)

## 2015-04-08 LAB — COMPREHENSIVE METABOLIC PANEL (CC13)
ALBUMIN: 2.3 g/dL — AB (ref 3.5–5.0)
ALT: 17 U/L (ref 0–55)
AST: 30 U/L (ref 5–34)
Alkaline Phosphatase: 147 U/L (ref 40–150)
Anion Gap: 6 mEq/L (ref 3–11)
BUN: 15.1 mg/dL (ref 7.0–26.0)
CALCIUM: 8.8 mg/dL (ref 8.4–10.4)
CHLORIDE: 105 meq/L (ref 98–109)
CO2: 28 mEq/L (ref 22–29)
CREATININE: 0.8 mg/dL (ref 0.7–1.3)
EGFR: >90 mL/min/{1.73_m2} (ref >90)
Glucose: 98 mg/dl (ref 70–140)
Potassium: 4 mEq/L (ref 3.5–5.1)
Sodium: 139 mEq/L (ref 136–145)
Total Bilirubin: 0.47 mg/dL (ref 0.20–1.20)
Total Protein: 6.5 g/dL (ref 6.4–8.3)

## 2015-04-08 NOTE — Telephone Encounter (Signed)
Per staff message and POF I have scheduled appts. Advised scheduler of appts. JMW  

## 2015-04-08 NOTE — Progress Notes (Signed)
[place]  [phone] [phone]) [phone]  Clinic follow-up Note   Patient Care Team: No Pcp Per Patient as PCP - General (General Practice) 04/08/2015  CHIEF COMPLAINTS Follow up metastatic gastric cancer    Gastric adenocarcinoma: Stage IV   04/23/2014 Imaging CT abdomen showed peritoneal carcinomatosis, ascites, thickning of gastric and GEJ wall, CT chest showed a few small b/l lung nodules.    04/24/2014 Tumor Marker CEA  5.6, CA19.9 13   04/25/2014 Initial Diagnosis Gastric adenocarcinoma: Stage IV   04/25/2014 Pathology Results Stomach mass biopsy showed - ADENOCARCINOMA WITH SIGNET RING FEATURES. ascites cytology (+)   04/25/2014 Procedure EGD: There was a large, nearly circumferential, ulcerated, clearly malignant mass that stradles the GE junction. The vast bulk of the mass lays in the stomach.    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   HISTORY OF PRESENTING ILLNESS (05/06/2014):  Samuel Durham 39 y.o. 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 I saw him first when he was recently admitted to [facility]. He is here for the first office visit after hospital discharge.  He was  admitted on 12/15 with progressive, 4 month history of dysphagia, epigastric abdominal discomfort and bloating, early satiation accompanied by nausea, and postprandial vomiting and diarrhea. He has experienced a 30 lbs weight loss in the process. He has noted increased abdominal girth. Patient denies any shortness of breath, chest pain, GI bleed, hematuria or lower extremity swelling. He denies  heavy alcohol intake. Denies tobacco abuse. Denies risk factors for HIV or hepatitis. He denies any family history of GI malignancies.  CT of the abdomen and pelvis with contrast on 12/15 revealed several pulmonary nodules at the bases bilaterally, profound thickening of the distal esophagus extending beyond the gastroesophageal junction into the proximal stomach,cardia and fundus of the stomach, most evident along the lesser curvature. No pathologic dilatation of small bowel or colon. Extensive upper abdominal and retroperitoneal lymphadenopathy was visualized. Moderate volume of ascites, presumably malignant, was noted. Extensive soft tissue nodularity throughout the omentum, compatible with omental caking and widespread peritoneal metastasis. Additionally, there are other areas of enhancing nodularity along the peritoneal surface. Tiny ventral and umbilical hernias containing predominantly omental fat, including some omental implants. There are no aggressive appearing lytic or blastic lesions noted in the visualized portions of the skeleton.  He underwent an EGD by Dr. Ardis Hughs on 04/25/2014, which showed a large fungating mass that straddles the GE junction and occupying the upper 1/3 part of the stomach. Multiple biopsy was taken which showed adenocarcinoma with signet ring features. He also underwent a paracentesis with 4.5 L fluid is removed on 04/26/2014. Cytology was positive for adenocarcinoma with signet ring features, consistent with metastatic disease.  He was discharged to home on 04/27/2014. He did feel a slightly informant of his abdominal bloating after the paracentesis, but not much improvement other symptoms. He had Mediport placement and the second paracentesis was again 4 L fluids removal on 05/02/2014. His appetite is low, he drinks ensure 3 bottles a day, some soup and a very little other foods. His energy level is also low, but  able to take care of all his daily needs, and move around  with about much difficulty. His abdominal discomfort/pain are relatively controlled by morphine 15 mg at night and tramadol 2-3 times during the day. He denies any fever, chills, cough, chest pain or any in the other discomfort.  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 He tolerated the first cycle of FOLFIRI chemotherapy very well, no significant diarrhea, nausea, or other side effects.  He had another paracentesis with 4.8 L ascites removed last week. His appetite and energy level remains to be well, and he celebrated Thanksgiving last week with his parents in [place]. He denies any fever or chills, no cough or other signs of infection.   MEDICAL HISTORY:  Past Medical History  Diagnosis Date  . Medical history non-contributory   . gastric ca dx'd 04/2014    SURGICAL HISTORY: Past Surgical History  Procedure Laterality Date  . Squamous cell carcinoma excision  approx March 2015    removed from top of head  . Esophagogastroduodenoscopy (egd) with propofol N/A 04/25/2014    Procedure: ESOPHAGOGASTRODUODENOSCOPY (EGD) WITH PROPOFOL;  Surgeon: Milus Banister, MD;  Location: [facility];  Service: Endoscopy;  Laterality: N/A;    SOCIAL HISTORY: Social History   Social History  . Marital Status: Single    Spouse Name: N/A  . Number of Children: N/A  . Years of Education: N/A   Occupational History  . Not on file.   Social History Main Topics  . Smoking status: Never Smoker   . Smokeless tobacco: Never Used  . Alcohol Use: No  . Drug Use: No  . Sexual Activity: Not on file   Other Topics Concern  . Not on file   Social History Narrative   Single, lives alone with pet dog (rescue dog)   Works as Information systems manager; drives; independent   Middle child between #2 sisters-his mother and sisters are very supportive    FAMILY HISTORY: Family History  Problem Relation Age of  Onset  . Breast cancer Sister 32   . Hypertension Mother   . Hypertension Father   Maternal aunt breast cancer at age of 62 Maternal cousin ovarian cancer at age of 74 Maternal ancle prostate cancer age of 34   ALLERGIES:  has No Known Allergies.  MEDICATIONS:  Current Outpatient Prescriptions  Medication Sig Dispense Refill  . docusate sodium 100 MG CAPS Take 100 mg by mouth 2 (two) times daily. 10 capsule 0  . lidocaine-prilocaine (EMLA) cream Apply 1 application topically as needed. 30 g 2  . mirtazapine (REMERON) 15 MG tablet Take 1 tablet (15 mg total) by mouth at bedtime. 30 tablet 1  . morphine (MS CONTIN) 15 MG 12 hr tablet Take 1 tablet (15 mg total) by mouth every 12 (twelve) hours. 60 tablet 0  . ondansetron (ZOFRAN) 4 MG tablet Take 1 tablet (4 mg total) by mouth every 8 (eight) hours as needed for nausea or vomiting. 20 tablet 0  . pantoprazole (PROTONIX) 40 MG tablet Take 1 tablet (40 mg total) by mouth daily. 30 tablet 0  . prochlorperazine (COMPAZINE) 10 MG tablet Take 1 tablet (10 mg total) by mouth every 6 (six) hours as needed for nausea or vomiting. 30 tablet 3  . traMADol (ULTRAM) 50 MG tablet Take 1-2 tablets (50-100 mg total) by mouth every 6 (six) hours as needed for moderate pain. 30 tablet  0   No current facility-administered medications for this visit.   Facility-Administered Medications Ordered in Other Visits  Medication Dose Route Frequency Provider Last Rate Last Dose  . sodium chloride 0.9 % injection 10 mL  10 mL Intravenous PRN Truitt Merle, MD   10 mL at 12/31/14 0900  . sodium chloride 0.9 % injection 10 mL  10 mL Intravenous PRN Truitt Merle, MD        REVIEW OF SYSTEMS:   Constitutional: Denies fevers, chills or abnormal night sweats, no recent weight loss Eyes: Denies blurriness of vision, double vision or watery eyes Ears, nose, mouth, throat, and face: Denies mucositis or sore throat Respiratory: Denies cough, dyspnea or wheezes Cardiovascular:  Denies palpitation, chest discomfort or lower extremity swelling Gastrointestinal:  Less nausea, abdominal bloating and discomfort Skin: Denies abnormal skin rashes Lymphatics: Denies new lymphadenopathy or easy bruising Neurological:Denies numbness, tingling or new weaknesses Behavioral/Psych: Mood is stable, no new changes  All other systems were reviewed with the patient and are negative except those mentioned in the history.  PHYSICAL EXAMINATION: ECOG PERFORMANCE STATUS: 1 GENERAL:alert, no distress and comfortable SKIN: skin color, texture, turgor are normal, no rashes or significant lesions EYES: normal, conjunctiva are pink and non-injected, sclera clear OROPHARYNX:no exudate, no erythema and lips, buccal mucosa, and tongue normal  NECK: supple, thyroid normal size, non-tender, without nodularity LYMPH:  no palpable lymphadenopathy in the cervical, axillary or inguinal LUNGS: clear to auscultation and percussion with normal breathing effort HEART: regular rate & rhythm and no murmurs and no lower extremity edema ABDOMEN:abdomen soft, non-tender and normal bowel sounds, probable moderate amount of ascites. Musculoskeletal:no cyanosis of digits and no clubbing  PSYCH: alert & oriented x 3 with fluent speech NEURO: no focal motor/sensory deficits  LABORATORY DATA:  I have reviewed the data as listed CBC Latest Ref Rng 04/08/2015 03/25/2015 03/11/2015  WBC 4.0 - 10.3 10e3/uL 1.7(L) 3.5(L) 4.0  Hemoglobin 13.0 - 17.1 g/dL 11.9(L) 12.8(L) 12.4(L)  Hematocrit 38.4 - 49.9 % 37.5(L) 38.6 38.1(L)  Platelets 140 - 400 10e3/uL 167 168 215      CMP Latest Ref Rng 04/08/2015 03/25/2015 03/11/2015  Glucose 70 - 140 mg/dl 98 100 92  BUN 7.0 - 26.0 mg/dL 15.1 16.1 16.9  Creatinine 0.7 - 1.3 mg/dL 0.8 0.8 0.7  Sodium 136 - 145 mEq/L 139 140 140  Potassium 3.5 - 5.1 mEq/L 4.0 4.2 4.0  Chloride 96 - 112 mEq/L - - -  CO2 22 - 29 mEq/L _0 Calcium 8.4 - 10.4 mg/dL 8.8 8.7 8.7  Total  Protein 6.4 - 8.3 g/dL 6.5 6.4 6.6  Total Bilirubin 0.20 - 1.20 mg/dL 0.47 0.50 0.57  Alkaline Phos 40 - 150 U/L 147 151(H) 140  AST 5 - 34 U/L _1 ALT 0 - 55 U/L _2 ANC 0.3 TODAY   CEA  Status: Finalresult Visible to patient:  MyChart Nextappt: 04/15/2015 at 08:15 AM in Oncology ([facility]) Dx:  Gastric adenocarcinoma: Stage IV              Ref Range 2wk ago (03/25/15) 17moago (02/25/15) 110mogo (02/11/15)    CEA 0.0 - 5.0 ng/mL 5.3 (H) 3.9 3.6          Pathology report  Diagnosis  06/07/2014 PERITONEAL/ASCITIC FLUID(SPECIMEN 1 OF 1 COLLECTED 04/26/14): METASTATIC ADENOCARCINOMA WITH SIGNET RING CELL FEATURES.  Stomach, biopsy, r/o cancer 04/25/14 - ADENOCARCINOMA WITH SIGNET RING FEATURES. Microscopic Comment  The results were called to Dr. Ardis Hughs on 04/26/2014. (JDP:kh 04/26/14) Claudette Laws MD Pathologist, Electronic Signature (Case signed 04/26/2014)  HER-2/NEU BY CISH - NO AMPLIFICATION OF HER-2 DETECTED. RESULT RATIO OF HER2: CEP 17 SIGNALS 1.20 AVERAGE HER2 COPY NUMBER PER CELL 2.10  ADDITIONAL INFORMATION: Mismatch Repair (MMR) Protein Immunohistochemistry (IHC) IHC Expression Result: MLH1: Preserved nuclear expression (greater 50% tumor expression) MSH2: Preserved nuclear expression (greater 50% tumor expression) MSH6: Preserved nuclear expression (greater 50% tumor expression) PMS2: Preserved nuclear expression (greater 50% tumor expression) * Internal control demonstrates intact nuclear expression Interpretation: NORMAL There is preserved expression of the major and minor MMR proteins. There is a very low probability that microsatellite instability (MSI) is present. However, certain clinically significant MMR protein mutations may result in preservation of nuclear expression. It is recommended that the preservation of protein expression be correlated with molecular based MSI testing.  [employer]  testing: (+) WE31 and NF1   RADIOGRAPHIC STUDIES: I have personally reviewed the radiological images as listed and agreed with the findings in the report.  Ct Chest, abdomen and pelvis Wo Contrast12/24/2015    IMPRESSION:  1. Findings, as above, highly concerning for either esophageal or gastric primary malignancy, with metastatic lymphadenopathy in the upper abdomen and retroperitoneum, widespread intraperitoneal spread of disease, malignant ascites, and multiple pulmonary nodules in the visualize lung bases concerning for pulmonary metastasis. Oncologic evaluation is recommended.  2. Additional incidental findings, as above. These results were called by telephone at the time of interpretation on 04/23/2014 at 7:53 pm to [place] , who verbally acknowledged these results.   Electronically Signed   By: Vinnie Langton M.D.   On: 04/23/2014 19:56   CT CAP 06/24/2014 IMPRESSION: 1. Progressive low density and calcifications within the extensive upper abdominal lymphadenopathy, likely related to partial treatment. Some of the lymph nodes do appear slightly larger. The irregular infiltrating mass involving the gastric cardia appears slightly improved. 2. No evidence of hepatic metastatic disease. 3. Overall volume of ascites has improved, although extensive changes of peritoneal carcinomatosis have not significantly changed. 4. No significant change in bilateral pulmonary nodules consistent with metastatic disease. 5. Resolution of bilateral pleural effusions.  CT chest, abdomen and pelvis with contrast on 08/26/2014 IMPRESSION: Overall stable to slight progression of disease. Existing pulmonary nodules are stable to slightly progressed with no new pulmonary nodules evident. The bulky upper abdominal lymphadenopathy is stable to minimally progressed while no substantial change in the primary infiltrating gastric neoplasm is discernible. Omental disease appears stable. There is  slightly more ascites on today's study.  CT chest, abdomen and pelvis with contrast on 10/21/2014 IMPRESSION: 1. Compared to the prior examination there appears to be slight progression of disease predominantly manifest by worsening gastric mucosal thickening, particularly along the greater curvature of the stomach in the region of the cardia and fundus, with increasing adjacent mesenteric/omental disease and slight increase in malignant ascites. Previously described upper abdominal lymphadenopathy is generally stable to slightly decreased compared to the prior examination, and previously noted pulmonary nodules are unchanged. 2. Additional incidental findings, as above.  CT chest, abdomen and pelvis with contrast, 03/20/2015 at [facility] IMPRESSION: The abdominal pelvic ascites has increased. Infiltration of the omentum has increased likely representing increasing peritoneal disease.  Some of the left gastric adenopathy is a few millimeters smaller.  No real change in the metastatic pulmonary nodules.   EGD WITH BIOPSY 04/25/2014 ENDOSCOPIC IMPRESSION: There was a large, nearly circumferential, ulcerated, clearly malignant mass that  stradles the GE junction. The vast bulk of the mass lays in the stomach, occupying about 1/3 of the stomach. The mass extends into the esophagus for about 4cm above the GE junction. Mutiple biopsies were taken from the gastric portion of the mass   I ASSESSMENT & PLAN:  39 year old gentleman without significant past medical history presents with abdominal bloating, nausea, vomiting, diarrhea, anemia and weight loss. EGD showed a large mass arising from the gastric fundus and extending into GE junction, biopsy showed adenocarcinoma with signet ring features, CT scan is consistent with peritoneal carcinomatosis and bilateral lung metastasis. Ascites cytology was positive for malignant cells.  1. 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 HER-2 negative, MMR-normal -I discussed his surgical biopsy results, cytology results, skin findings with patient and his family members, including his parents and 2 sisters. Unfortunately, this is widely metastatic, not curable, and overall prognosis is very poor. Literature has showed gastric adenocarcinoma with signet ring features has overall worse outcome comparing to others. His life expectancy is probably less than 6 months without treatment. -He has been on first-line chemotherapy FOLFOX for 10-11 months now, tolerating very well, and his symptoms have much  improved. -His tumor is negative for HER-2 overexpression, no role for trastuzumab. -He recently had disease progression, with significantly increased ascites, required frequent paracentesis. I have switched him to second line chemotherapy FOLFIRI, he he tolerated the first cycle well. -He has significant neutropenia today, ANC 0.3, afebrile, no signs of infection. We'll postpone his chemotherapy 2 next week. -We'll add Neulasta from next cycle chemotherapy  2. Recurrent malignant ascites -s/p paracentesis at the beginning of his diagnosis, and again on 03/10/15 and 03/24/15, and 11/23 - I'll schedule him for repeated paracentesis next week 12/5. We discussed the role of Pleurx  If his ascites does not respond to second line chemotherapy. -We'll monitor closely from now on  3. Anemia, multifactorial (iron deficient anemia, cancer related and chemotherapy induced ) -His ferritin level was normal at 171, but he has low iron and saturation, likely some degree of iron deficient anemia. -He has received IV Feraheme 510 mg twice, 2 weeks apart -Blood transfusion as needed during chemotherapy if her hemoglobin below 8. -Mild anemia has been stable.   Plan -Postpone his second cycle FOFIRI  2 next week, at [facility] on day 3  - I'll see him back in 3 weeks before cycle 3 - I'll schedule him for  repeated paracentesis on 12/5   The patient knows to call the clinic with any problems, questions or concerns.  I spent 20 minutes counseling the patient face to face. The total time spent in the appointment was 25  minutes and more than 50% was on counseling.     Truitt Merle, MD 04/08/2015

## 2015-04-08 NOTE — Telephone Encounter (Signed)
Gave and printed appt sched and avs for pt for DEC...emailed MW to add tx. °

## 2015-04-14 ENCOUNTER — Ambulatory Visit (HOSPITAL_COMMUNITY)
Admission: RE | Admit: 2015-04-14 | Discharge: 2015-04-14 | Disposition: A | Discharge status: Home / Self Care | Payer: BLUE CROSS/BLUE SHIELD | Source: Ambulatory Visit | Attending: Hematology | Admitting: Hematology

## 2015-04-14 DIAGNOSIS — C169 Malignant neoplasm of stomach, unspecified: Secondary | ICD-10-CM

## 2015-04-14 DIAGNOSIS — R188 Other ascites: Secondary | ICD-10-CM | POA: Insufficient documentation

## 2015-04-14 NOTE — Procedures (Signed)
Successful US guided paracentesis from RUQ.  Yielded 5.0 liters of serous colored fluid.  No immediate complications.  Pt tolerated well.   Specimen was not sent for labs.  Tsosie Billing D PA-C 04/14/2015 3:49 PM

## 2015-04-15 ENCOUNTER — Ambulatory Visit (HOSPITAL_BASED_OUTPATIENT_CLINIC_OR_DEPARTMENT_OTHER): Payer: BLUE CROSS/BLUE SHIELD

## 2015-04-15 ENCOUNTER — Other Ambulatory Visit (HOSPITAL_BASED_OUTPATIENT_CLINIC_OR_DEPARTMENT_OTHER): Payer: BLUE CROSS/BLUE SHIELD

## 2015-04-15 ENCOUNTER — Other Ambulatory Visit: Payer: Self-pay | Admitting: *Deleted

## 2015-04-15 VITALS — BP 117/66 | HR 76 | Temp 98.4°F | Resp 18

## 2015-04-15 DIAGNOSIS — C7801 Secondary malignant neoplasm of right lung: Secondary | ICD-10-CM

## 2015-04-15 DIAGNOSIS — C7802 Secondary malignant neoplasm of left lung: Secondary | ICD-10-CM | POA: Diagnosis not present

## 2015-04-15 DIAGNOSIS — C786 Secondary malignant neoplasm of retroperitoneum and peritoneum: Secondary | ICD-10-CM

## 2015-04-15 DIAGNOSIS — C169 Malignant neoplasm of stomach, unspecified: Secondary | ICD-10-CM

## 2015-04-15 DIAGNOSIS — C801 Malignant (primary) neoplasm, unspecified: Secondary | ICD-10-CM

## 2015-04-15 DIAGNOSIS — Z5111 Encounter for antineoplastic chemotherapy: Secondary | ICD-10-CM | POA: Diagnosis not present

## 2015-04-15 LAB — COMPREHENSIVE METABOLIC PANEL
ALBUMIN: 2.5 g/dL — AB (ref 3.5–5.0)
ALK PHOS: 164 U/L — AB (ref 40–150)
ALT: 15 U/L (ref 0–55)
AST: 31 U/L (ref 5–34)
Anion Gap: 9 mEq/L (ref 3–11)
BILIRUBIN TOTAL: 0.33 mg/dL (ref 0.20–1.20)
BUN: 22.7 mg/dL (ref 7.0–26.0)
CO2: 26 mEq/L (ref 22–29)
Calcium: 8.9 mg/dL (ref 8.4–10.4)
Chloride: 106 mEq/L (ref 98–109)
Creatinine: 0.9 mg/dL (ref 0.7–1.3)
GLUCOSE: 86 mg/dL (ref 70–140)
Potassium: 4.3 mEq/L (ref 3.5–5.1)
SODIUM: 142 meq/L (ref 136–145)
TOTAL PROTEIN: 6.7 g/dL (ref 6.4–8.3)

## 2015-04-15 LAB — CBC WITH DIFFERENTIAL/PLATELET
BASO%: 0.8 % (ref 0.0–2.0)
Basophils Absolute: 0 10*3/uL (ref 0.0–0.1)
EOS ABS: 0 10*3/uL (ref 0.0–0.5)
EOS%: 1 % (ref 0.0–7.0)
HCT: 36.3 % — ABNORMAL LOW (ref 38.4–49.9)
HEMOGLOBIN: 11.8 g/dL — AB (ref 13.0–17.1)
LYMPH#: 0.9 10*3/uL (ref 0.9–3.3)
LYMPH%: 23.9 % (ref 14.0–49.0)
MCH: 29.4 pg (ref 27.2–33.4)
MCHC: 32.6 g/dL (ref 32.0–36.0)
MCV: 90.1 fL (ref 79.3–98.0)
MONO#: 0.9 10*3/uL (ref 0.1–0.9)
MONO%: 21.5 % — AB (ref 0.0–14.0)
NEUT%: 52.8 % (ref 39.0–75.0)
NEUTROS ABS: 2.1 10*3/uL (ref 1.5–6.5)
PLATELETS: 240 10*3/uL (ref 140–400)
RBC: 4.03 10*6/uL — ABNORMAL LOW (ref 4.20–5.82)
RDW: 16.5 % — ABNORMAL HIGH (ref 11.0–14.6)
WBC: 4 10*3/uL (ref 4.0–10.3)

## 2015-04-15 LAB — CEA: CEA: 3.7 ng/mL (ref 0.0–5.0)

## 2015-04-15 MED ORDER — SODIUM CHLORIDE 0.9 % IV SOLN
2200.0000 mg/m2 | INTRAVENOUS | Status: DC
  Administered 2015-04-15: 5500 mg via INTRAVENOUS
  Filled 2015-04-15: qty 110

## 2015-04-15 MED ORDER — PROCHLORPERAZINE MALEATE 10 MG PO TABS
10.0000 mg | ORAL_TABLET | Freq: Once | ORAL | Status: AC
  Administered 2015-04-15: 10 mg via ORAL

## 2015-04-15 MED ORDER — SODIUM CHLORIDE 0.9 % IV SOLN
Freq: Once | INTRAVENOUS | Status: AC
  Administered 2015-04-15: 09:00:00 via INTRAVENOUS
  Filled 2015-04-15: qty 8

## 2015-04-15 MED ORDER — SODIUM CHLORIDE 0.9 % IV SOLN
Freq: Once | INTRAVENOUS | Status: AC
  Administered 2015-04-15: 09:00:00 via INTRAVENOUS

## 2015-04-15 MED ORDER — LEUCOVORIN CALCIUM INJECTION 350 MG
400.0000 mg/m2 | Freq: Once | INTRAVENOUS | Status: AC
  Administered 2015-04-15: 996 mg via INTRAVENOUS
  Filled 2015-04-15: qty 49.8

## 2015-04-15 MED ORDER — ATROPINE SULFATE 1 MG/ML IJ SOLN: 0.5000 mg | Freq: Once | INTRAMUSCULAR | Status: DC | PRN

## 2015-04-15 MED ORDER — IRINOTECAN HCL CHEMO INJECTION 100 MG/5ML
180.0000 mg/m2 | Freq: Once | INTRAVENOUS | Status: AC
  Administered 2015-04-15: 448 mg via INTRAVENOUS
  Filled 2015-04-15: qty 22.4

## 2015-04-15 NOTE — Patient Instructions (Signed)
[facility] Discharge Instructions for Patients Receiving Chemotherapy  Today you received the following chemotherapy agents: Camptosar, Leucovorin, Adrucil   To help prevent nausea and vomiting after your treatment, we encourage you to take your nausea medication as directed.    If you develop nausea and vomiting that is not controlled by your nausea medication, call the clinic.   BELOW ARE SYMPTOMS THAT SHOULD BE REPORTED IMMEDIATELY:  *FEVER GREATER THAN 100.5 F  *CHILLS WITH OR WITHOUT FEVER  NAUSEA AND VOMITING THAT IS NOT CONTROLLED WITH YOUR NAUSEA MEDICATION  *UNUSUAL SHORTNESS OF BREATH  *UNUSUAL BRUISING OR BLEEDING  TENDERNESS IN MOUTH AND THROAT WITH OR WITHOUT PRESENCE OF ULCERS  *URINARY PROBLEMS  *BOWEL PROBLEMS  UNUSUAL RASH Items with * indicate a potential emergency and should be followed up as soon as possible.  Feel free to call the clinic you have any questions or concerns. The clinic phone number is [phone].  Please show the [place] at check-in to the Emergency Department and triage nurse.  Irinotecan injection(Camptosar) What is this medicine? IRINOTECAN (ir in oh TEE kan ) is a chemotherapy drug. It is used to treat colon and rectal cancer. This medicine may be used for other purposes; ask your health care provider or pharmacist if you have questions. What should I tell my health care provider before I take this medicine? They need to know if you have any of these conditions: -blood disorders -dehydration -diarrhea -infection (especially a virus infection such as chickenpox, cold sores, or herpes) -liver disease -low blood counts, like low white cell, platelet, or red cell counts -recent or ongoing radiation therapy -an unusual or allergic reaction to irinotecan, sorbitol, other chemotherapy, other medicines, foods, dyes, or preservatives -pregnant or trying to get pregnant -breast-feeding How should I use this  medicine? This drug is given as an infusion into a vein. It is administered in a hospital or clinic by a specially trained health care professional. Talk to your pediatrician regarding the use of this medicine in children. Special care may be needed. Overdosage: If you think you have taken too much of this medicine contact a poison control center or emergency room at once. NOTE: This medicine is only for you. Do not share this medicine with others. What if I miss a dose? It is important not to miss your dose. Call your doctor or health care professional if you are unable to keep an appointment. What may interact with this medicine? Do not take this medicine with any of the following medications: -atazanavir -certain medicines for fungal infections like itraconazole and ketoconazole -[facility] Wort This medicine may also interact with the following medications: -dexamethasone -diuretics -laxatives -medicines for seizures like carbamazepine, mephobarbital, phenobarbital, phenytoin, primidone -medicines to increase blood counts like filgrastim, pegfilgrastim, sargramostim -prochlorperazine -vaccines This list may not describe all possible interactions. Give your health care provider a list of all the medicines, herbs, non-prescription drugs, or dietary supplements you use. Also tell them if you smoke, drink alcohol, or use illegal drugs. Some items may interact with your medicine. What should I watch for while using this medicine? Your condition will be monitored carefully while you are receiving this medicine. You will need important blood work done while you are taking this medicine. This drug may make you feel generally unwell. This is not uncommon, as chemotherapy can affect healthy cells as well as cancer cells. Report any side effects. Continue your course of treatment even though you feel ill  unless your doctor tells you to stop. In some cases, you may be given additional medicines to  help with side effects. Follow all directions for their use. You may get drowsy or dizzy. Do not drive, use machinery, or do anything that needs mental alertness until you know how this medicine affects you. Do not stand or sit up quickly, especially if you are an older patient. This reduces the risk of dizzy or fainting spells. Call your doctor or health care professional for advice if you get a fever, chills or sore throat, or other symptoms of a cold or flu. Do not treat yourself. This drug decreases your body's ability to fight infections. Try to avoid being around people who are sick. This medicine may increase your risk to bruise or bleed. Call your doctor or health care professional if you notice any unusual bleeding. Be careful brushing and flossing your teeth or using a toothpick because you may get an infection or bleed more easily. If you have any dental work done, tell your dentist you are receiving this medicine. Avoid taking products that contain aspirin, acetaminophen, ibuprofen, naproxen, or ketoprofen unless instructed by your doctor. These medicines may hide a fever. Do not become pregnant while taking this medicine. Women should inform their doctor if they wish to become pregnant or think they might be pregnant. There is a potential for serious side effects to an unborn child. Talk to your health care professional or pharmacist for more information. Do not breast-feed an infant while taking this medicine. What side effects may I notice from receiving this medicine? Side effects that you should report to your doctor or health care professional as soon as possible: -allergic reactions like skin rash, itching or hives, swelling of the face, lips, or tongue -low blood counts - this medicine may decrease the number of white blood cells, red blood cells and platelets. You may be at increased risk for infections and bleeding. -signs of infection - fever or chills, cough, sore throat, pain or  difficulty passing urine -signs of decreased platelets or bleeding - bruising, pinpoint red spots on the skin, black, tarry stools, blood in the urine -signs of decreased red blood cells - unusually weak or tired, fainting spells, lightheadedness -breathing problems -chest pain -diarrhea -feeling faint or lightheaded, falls -flushing, runny nose, sweating during infusion -mouth sores or pain -pain, swelling, redness or irritation where injected -pain, swelling, warmth in the leg -pain, tingling, numbness in the hands or feet -problems with balance, talking, walking -stomach cramps, pain -trouble passing urine or change in the amount of urine -vomiting as to be unable to hold down drinks or food -yellowing of the eyes or skin Side effects that usually do not require medical attention (report to your doctor or health care professional if they continue or are bothersome): -constipation -hair loss -headache -loss of appetite -nausea, vomiting -stomach upset This list may not describe all possible side effects. Call your doctor for medical advice about side effects. You may report side effects to FDA at 1-800-FDA-1088. Where should I keep my medicine? This drug is given in a hospital or clinic and will not be stored at home. NOTE: This sheet is a summary. It may not cover all possible information. If you have questions about this medicine, talk to your doctor, pharmacist, or health care provider.    2016, [employer]. (2012-10-23 16:29:32)

## 2015-04-17 ENCOUNTER — Ambulatory Visit: Payer: BLUE CROSS/BLUE SHIELD

## 2015-04-17 ENCOUNTER — Ambulatory Visit (HOSPITAL_BASED_OUTPATIENT_CLINIC_OR_DEPARTMENT_OTHER): Payer: BLUE CROSS/BLUE SHIELD

## 2015-04-17 VITALS — BP 116/65 | HR 60 | Temp 97.9°F | Resp 18

## 2015-04-17 DIAGNOSIS — D6481 Anemia due to antineoplastic chemotherapy: Secondary | ICD-10-CM | POA: Diagnosis not present

## 2015-04-17 DIAGNOSIS — Z5189 Encounter for other specified aftercare: Secondary | ICD-10-CM | POA: Diagnosis not present

## 2015-04-17 DIAGNOSIS — C169 Malignant neoplasm of stomach, unspecified: Secondary | ICD-10-CM

## 2015-04-17 MED ORDER — SODIUM CHLORIDE 0.9 % IJ SOLN
10.0000 mL | INTRAMUSCULAR | Status: DC | PRN
  Administered 2015-04-17: 10 mL
  Filled 2015-04-17: qty 10

## 2015-04-17 MED ORDER — PEGFILGRASTIM INJECTION 6 MG/0.6ML ~~LOC~~
6.0000 mg | PREFILLED_SYRINGE | Freq: Once | SUBCUTANEOUS | Status: AC
Start: 2015-04-17 — End: 2015-04-17
  Administered 2015-04-17: 6 mg via SUBCUTANEOUS
  Filled 2015-04-17: qty 0.6

## 2015-04-17 MED ORDER — HEPARIN SOD (PORK) LOCK FLUSH 100 UNIT/ML IV SOLN
500.0000 [IU] | Freq: Once | INTRAVENOUS | Status: AC | PRN
  Administered 2015-04-17: 500 [IU]
  Filled 2015-04-17: qty 5

## 2015-04-17 NOTE — Progress Notes (Signed)
Neulasta injection given by flush nurse after home infusion pump DC'd.

## 2015-04-17 NOTE — Patient Instructions (Signed)
Pegfilgrastim injection What is this medicine? PEGFILGRASTIM (PEG fil gra stim) is a long-acting granulocyte colony-stimulating factor that stimulates the growth of neutrophils, a type of white blood cell important in the body's fight against infection. It is used to reduce the incidence of fever and infection in patients with certain types of cancer who are receiving chemotherapy that affects the bone marrow, and to increase survival after being exposed to high doses of radiation. This medicine may be used for other purposes; ask your health care provider or pharmacist if you have questions. What should I tell my health care provider before I take this medicine? They need to know if you have any of these conditions: -kidney disease -latex allergy -ongoing radiation therapy -sickle cell disease -skin reactions to acrylic adhesives (On-Body Injector only) -an unusual or allergic reaction to pegfilgrastim, filgrastim, other medicines, foods, dyes, or preservatives -pregnant or trying to get pregnant -breast-feeding How should I use this medicine? This medicine is for injection under the skin. If you get this medicine at home, you will be taught how to prepare and give the pre-filled syringe or how to use the On-body Injector. Refer to the patient Instructions for Use for detailed instructions. Use exactly as directed. Take your medicine at regular intervals. Do not take your medicine more often than directed. It is important that you put your used needles and syringes in a special sharps container. Do not put them in a trash can. If you do not have a sharps container, call your pharmacist or healthcare provider to get one. Talk to your pediatrician regarding the use of this medicine in children. While this drug may be prescribed for selected conditions, precautions do apply. Overdosage: If you think you have taken too much of this medicine contact a poison control center or emergency room at  once. NOTE: This medicine is only for you. Do not share this medicine with others. What if I miss a dose? It is important not to miss your dose. Call your doctor or health care professional if you miss your dose. If you miss a dose due to an On-body Injector failure or leakage, a new dose should be administered as soon as possible using a single prefilled syringe for manual use. What may interact with this medicine? Interactions have not been studied. Give your health care provider a list of all the medicines, herbs, non-prescription drugs, or dietary supplements you use. Also tell them if you smoke, drink alcohol, or use illegal drugs. Some items may interact with your medicine. This list may not describe all possible interactions. Give your health care provider a list of all the medicines, herbs, non-prescription drugs, or dietary supplements you use. Also tell them if you smoke, drink alcohol, or use illegal drugs. Some items may interact with your medicine. What should I watch for while using this medicine? You may need blood work done while you are taking this medicine. If you are going to need a MRI, CT scan, or other procedure, tell your doctor that you are using this medicine (On-Body Injector only). What side effects may I notice from receiving this medicine? Side effects that you should report to your doctor or health care professional as soon as possible: -allergic reactions like skin rash, itching or hives, swelling of the face, lips, or tongue -dizziness -fever -pain, redness, or irritation at site where injected -pinpoint red spots on the skin -red or dark-brown urine -shortness of breath or breathing problems -stomach or side pain, or pain   at the shoulder -swelling -tiredness -trouble passing urine or change in the amount of urine Side effects that usually do not require medical attention (report to your doctor or health care professional if they continue or are  bothersome): -bone pain -muscle pain This list may not describe all possible side effects. Call your doctor for medical advice about side effects. You may report side effects to FDA at 1-800-FDA-1088. Where should I keep my medicine? Keep out of the reach of children. Store pre-filled syringes in a refrigerator between 2 and 8 degrees C (36 and 46 degrees F). Do not freeze. Keep in carton to protect from light. Throw away this medicine if it is left out of the refrigerator for more than 48 hours. Throw away any unused medicine after the expiration date. NOTE: This sheet is a summary. It may not cover all possible information. If you have questions about this medicine, talk to your doctor, pharmacist, or health care provider.    2016, [employer]. (2014-05-16 14:30:14)  

## 2015-04-22 ENCOUNTER — Ambulatory Visit: Payer: BLUE CROSS/BLUE SHIELD

## 2015-04-22 ENCOUNTER — Ambulatory Visit: Payer: BLUE CROSS/BLUE SHIELD | Admitting: Hematology

## 2015-04-22 ENCOUNTER — Other Ambulatory Visit: Payer: BLUE CROSS/BLUE SHIELD

## 2015-04-29 ENCOUNTER — Ambulatory Visit (HOSPITAL_BASED_OUTPATIENT_CLINIC_OR_DEPARTMENT_OTHER): Payer: BLUE CROSS/BLUE SHIELD

## 2015-04-29 ENCOUNTER — Ambulatory Visit (HOSPITAL_BASED_OUTPATIENT_CLINIC_OR_DEPARTMENT_OTHER): Payer: BLUE CROSS/BLUE SHIELD | Admitting: Hematology

## 2015-04-29 ENCOUNTER — Other Ambulatory Visit (HOSPITAL_BASED_OUTPATIENT_CLINIC_OR_DEPARTMENT_OTHER): Payer: BLUE CROSS/BLUE SHIELD

## 2015-04-29 ENCOUNTER — Telehealth: Payer: Self-pay | Admitting: *Deleted

## 2015-04-29 ENCOUNTER — Encounter: Payer: Self-pay | Admitting: Hematology

## 2015-04-29 VITALS — BP 131/66 | HR 66 | Temp 97.7°F | Resp 20 | Ht 66.0 in | Wt 284.0 lb

## 2015-04-29 DIAGNOSIS — C786 Secondary malignant neoplasm of retroperitoneum and peritoneum: Secondary | ICD-10-CM

## 2015-04-29 DIAGNOSIS — R180 Malignant ascites: Secondary | ICD-10-CM

## 2015-04-29 DIAGNOSIS — D509 Iron deficiency anemia, unspecified: Secondary | ICD-10-CM

## 2015-04-29 DIAGNOSIS — Z5111 Encounter for antineoplastic chemotherapy: Secondary | ICD-10-CM

## 2015-04-29 DIAGNOSIS — C7800 Secondary malignant neoplasm of unspecified lung: Secondary | ICD-10-CM | POA: Diagnosis not present

## 2015-04-29 DIAGNOSIS — C801 Malignant (primary) neoplasm, unspecified: Secondary | ICD-10-CM

## 2015-04-29 DIAGNOSIS — D630 Anemia in neoplastic disease: Secondary | ICD-10-CM

## 2015-04-29 DIAGNOSIS — C169 Malignant neoplasm of stomach, unspecified: Secondary | ICD-10-CM

## 2015-04-29 DIAGNOSIS — D6481 Anemia due to antineoplastic chemotherapy: Secondary | ICD-10-CM

## 2015-04-29 LAB — CBC WITH DIFFERENTIAL/PLATELET
BASO%: 0.4 % (ref 0.0–2.0)
Basophils Absolute: 0 10*3/uL (ref 0.0–0.1)
EOS ABS: 0.1 10*3/uL (ref 0.0–0.5)
EOS%: 2.4 % (ref 0.0–7.0)
HEMATOCRIT: 36.6 % — AB (ref 38.4–49.9)
HGB: 11.7 g/dL — ABNORMAL LOW (ref 13.0–17.1)
LYMPH#: 1.5 10*3/uL (ref 0.9–3.3)
LYMPH%: 25.4 % (ref 14.0–49.0)
MCH: 28.7 pg (ref 27.2–33.4)
MCHC: 31.9 g/dL — ABNORMAL LOW (ref 32.0–36.0)
MCV: 89.8 fL (ref 79.3–98.0)
MONO#: 1.2 10*3/uL — AB (ref 0.1–0.9)
MONO%: 19.9 % — ABNORMAL HIGH (ref 0.0–14.0)
NEUT%: 51.9 % (ref 39.0–75.0)
NEUTROS ABS: 3 10*3/uL (ref 1.5–6.5)
PLATELETS: 178 10*3/uL (ref 140–400)
RBC: 4.08 10*6/uL — AB (ref 4.20–5.82)
RDW: 16.6 % — ABNORMAL HIGH (ref 11.0–14.6)
WBC: 5.8 10*3/uL (ref 4.0–10.3)

## 2015-04-29 LAB — COMPREHENSIVE METABOLIC PANEL
ALK PHOS: 167 U/L — AB (ref 40–150)
ALT: 24 U/L (ref 0–55)
ANION GAP: 8 meq/L (ref 3–11)
AST: 32 U/L (ref 5–34)
Albumin: 2.7 g/dL — ABNORMAL LOW (ref 3.5–5.0)
BILIRUBIN TOTAL: 0.38 mg/dL (ref 0.20–1.20)
BUN: 14.7 mg/dL (ref 7.0–26.0)
CALCIUM: 9 mg/dL (ref 8.4–10.4)
CO2: 27 meq/L (ref 22–29)
CREATININE: 0.9 mg/dL (ref 0.7–1.3)
Chloride: 106 mEq/L (ref 98–109)
Glucose: 91 mg/dl (ref 70–140)
Potassium: 3.8 mEq/L (ref 3.5–5.1)
Sodium: 140 mEq/L (ref 136–145)
TOTAL PROTEIN: 6.8 g/dL (ref 6.4–8.3)

## 2015-04-29 MED ORDER — HEPARIN SOD (PORK) LOCK FLUSH 100 UNIT/ML IV SOLN
500.0000 [IU] | Freq: Once | INTRAVENOUS | Status: DC | PRN
  Filled 2015-04-29: qty 5

## 2015-04-29 MED ORDER — PROCHLORPERAZINE MALEATE 10 MG PO TABS
10.0000 mg | ORAL_TABLET | Freq: Once | ORAL | Status: AC
  Administered 2015-04-29: 10 mg via ORAL

## 2015-04-29 MED ORDER — SODIUM CHLORIDE 0.9 % IV SOLN
Freq: Once | INTRAVENOUS | Status: AC
  Administered 2015-04-29: 10:00:00 via INTRAVENOUS

## 2015-04-29 MED ORDER — LEUCOVORIN CALCIUM INJECTION 350 MG
400.0000 mg/m2 | Freq: Once | INTRAVENOUS | Status: AC
  Administered 2015-04-29: 996 mg via INTRAVENOUS
  Filled 2015-04-29: qty 49.8

## 2015-04-29 MED ORDER — IRINOTECAN HCL CHEMO INJECTION 100 MG/5ML
180.0000 mg/m2 | Freq: Once | INTRAVENOUS | Status: AC
  Administered 2015-04-29: 448 mg via INTRAVENOUS
  Filled 2015-04-29: qty 22.4

## 2015-04-29 MED ORDER — SODIUM CHLORIDE 0.9 % IJ SOLN
10.0000 mL | INTRAMUSCULAR | Status: DC | PRN
  Filled 2015-04-29: qty 10

## 2015-04-29 MED ORDER — SODIUM CHLORIDE 0.9 % IV SOLN
2200.0000 mg/m2 | INTRAVENOUS | Status: DC
  Administered 2015-04-29: 5500 mg via INTRAVENOUS
  Filled 2015-04-29: qty 110

## 2015-04-29 MED ORDER — ATROPINE SULFATE 1 MG/ML IJ SOLN: 0.5000 mg | Freq: Once | INTRAMUSCULAR | Status: DC | PRN

## 2015-04-29 MED ORDER — PROCHLORPERAZINE MALEATE 10 MG PO TABS
ORAL_TABLET | ORAL | Status: AC
Start: 2015-04-29 — End: 2015-04-29
  Filled 2015-04-29: qty 1

## 2015-04-29 MED ORDER — SODIUM CHLORIDE 0.9 % IV SOLN
Freq: Once | INTRAVENOUS | Status: AC
  Administered 2015-04-29: 11:00:00 via INTRAVENOUS
  Filled 2015-04-29: qty 8

## 2015-04-29 NOTE — Patient Instructions (Signed)
[facility] Discharge Instructions for Patients Receiving Chemotherapy  Today you received the following chemotherapy agents Irinotecan/Leucovorin/Fluorouracil.  To help prevent nausea and vomiting after your treatment, we encourage you to take your nausea medication as directed.   If you develop nausea and vomiting that is not controlled by your nausea medication, call the clinic.   BELOW ARE SYMPTOMS THAT SHOULD BE REPORTED IMMEDIATELY:  *FEVER GREATER THAN 100.5 F  *CHILLS WITH OR WITHOUT FEVER  NAUSEA AND VOMITING THAT IS NOT CONTROLLED WITH YOUR NAUSEA MEDICATION  *UNUSUAL SHORTNESS OF BREATH  *UNUSUAL BRUISING OR BLEEDING  TENDERNESS IN MOUTH AND THROAT WITH OR WITHOUT PRESENCE OF ULCERS  *URINARY PROBLEMS  *BOWEL PROBLEMS  UNUSUAL RASH Items with * indicate a potential emergency and should be followed up as soon as possible.  Feel free to call the clinic you have any questions or concerns. The clinic phone number is [phone].  Please show the CHEMO ALERT CARD at check-in to the Emergency Department and triage nurse.    

## 2015-04-29 NOTE — Telephone Encounter (Signed)
Per staff message and POF I have scheduled appts. Advised scheduler of appts and no available on 1/3 due to holiday moved to 1/4. JMW

## 2015-04-29 NOTE — Progress Notes (Signed)
Samuel Durham  [phone] Fax:([phone]) [phone]  Clinic follow-up Note   Patient Care Team: No Pcp Per Patient as PCP - General (General Practice) 04/29/2015  CHIEF COMPLAINTS Follow up metastatic gastric cancer    Gastric adenocarcinoma: Stage IV   04/23/2014 Imaging CT abdomen showed peritoneal carcinomatosis, ascites, thickning of gastric and GEJ wall, CT chest showed a few small b/l lung nodules.    04/24/2014 Tumor Marker CEA  5.6, CA19.9 13   04/25/2014 Initial Diagnosis Gastric adenocarcinoma: Stage IV   04/25/2014 Pathology Results Stomach mass biopsy showed - ADENOCARCINOMA WITH SIGNET RING FEATURES. ascites cytology (+)   04/25/2014 Procedure EGD: There was a large, nearly circumferential, ulcerated, clearly malignant mass that stradles the GE junction. The vast bulk of the mass lays in the stomach.    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 I saw him first when he was recently admitted to [facility]. He is here for the first office visit after hospital discharge.  He was  admitted on 12/15 with progressive, 4 month history of dysphagia, epigastric abdominal discomfort and bloating, early satiation accompanied by nausea, and postprandial vomiting and diarrhea. He has experienced a 30 lbs weight loss in the process. He has noted increased abdominal girth. Patient denies any shortness of breath, chest pain, GI bleed, hematuria or lower extremity swelling. He denies heavy alcohol intake. Denies tobacco abuse. Denies risk factors  for HIV or hepatitis. He denies any family history of GI malignancies.  CT of the abdomen and pelvis with contrast on 12/15 revealed several pulmonary nodules at the bases bilaterally, profound thickening of the distal esophagus extending beyond the gastroesophageal junction into the proximal stomach,cardia and fundus of the stomach, most evident along the lesser curvature. No pathologic dilatation of small bowel or colon. Extensive upper abdominal and retroperitoneal lymphadenopathy was visualized. Moderate volume of ascites, presumably malignant, was noted. Extensive soft tissue nodularity throughout the omentum, compatible with omental caking and widespread peritoneal metastasis. Additionally, there are other areas of enhancing nodularity along the peritoneal surface. Tiny ventral and umbilical hernias containing predominantly omental fat, including some omental implants. There are no aggressive appearing lytic or blastic lesions noted in the visualized portions of the skeleton.  He underwent an EGD by Dr. Ardis Hughs on 04/25/2014, which showed a large fungating mass that straddles the GE junction and occupying the upper 1/3 part of the stomach. Multiple biopsy was taken which showed adenocarcinoma with signet ring features. He also underwent a paracentesis with 4.5 L fluid is removed on 04/26/2014. Cytology was positive for adenocarcinoma with signet ring features, consistent with metastatic disease.  He was discharged to home on 04/27/2014. He did feel a slightly informant of his abdominal bloating after the paracentesis, but not much improvement other symptoms. He had Mediport placement and the second paracentesis was again 4 L fluids removal on 05/02/2014. His appetite is low, he drinks ensure 3 bottles a day, some soup and a very little other foods. His energy level is also low, but able to take care of all his daily needs, and  move around with about much difficulty. His abdominal discomfort/pain are  relatively controlled by morphine 15 mg at night and tramadol 2-3 times during the day. He denies any fever, chills, cough, chest pain or any in the other discomfort.  [employer] genetic test (0/73/7106): NF1 splice site [id number] del 47 TP53 R213*   CURRENT THERAPY: FOLFIRI every 2 weeks, started on 03/25/2015.  INTERIM HISTORY: [place] returns for follow up. He is doing well overall. His last paracentesis was 2 weeks ago. He has not noticed any abdominal bloating or pain since then. He has good appetite and energy level, tolerating chemotherapy very well. He denies any nausea, diarrhea, fever, or other symptoms. His weight is stable.  MEDICAL HISTORY:  Past Medical History  Diagnosis Date  . Medical history non-contributory   . gastric ca dx'd 04/2014    SURGICAL HISTORY: Past Surgical History  Procedure Laterality Date  . Squamous cell carcinoma excision  approx March 2015    removed from top of head  . Esophagogastroduodenoscopy (egd) with propofol N/A 04/25/2014    Procedure: ESOPHAGOGASTRODUODENOSCOPY (EGD) WITH PROPOFOL;  Surgeon: Milus Banister, MD;  Location: [facility];  Service: Endoscopy;  Laterality: N/A;    SOCIAL HISTORY: Social History   Social History  . Marital Status: Single    Spouse Name: N/A  . Number of Children: N/A  . Years of Education: N/A   Occupational History  . Not on file.   Social History Main Topics  . Smoking status: Never Smoker   . Smokeless tobacco: Never Used  . Alcohol Use: No  . Drug Use: No  . Sexual Activity: Not on file   Other Topics Concern  . Not on file   Social History Narrative   Single, lives alone with pet dog (rescue dog)   Works as Information systems manager; drives; independent   Middle child between #2 sisters-his mother and sisters are very supportive    FAMILY HISTORY: Family History  Problem Relation Age of Onset  . Breast cancer Sister 33   . Hypertension Mother   . Hypertension Father   Maternal  aunt breast cancer at age of 79 Maternal cousin ovarian cancer at age of 70 Maternal ancle prostate cancer age of 25   ALLERGIES:  has No Known Allergies.  MEDICATIONS:  Current Outpatient Prescriptions  Medication Sig Dispense Refill  . docusate sodium 100 MG CAPS Take 100 mg by mouth 2 (two) times daily. 10 capsule 0  . lidocaine-prilocaine (EMLA) cream Apply 1 application topically as needed. 30 g 2  . mirtazapine (REMERON) 15 MG tablet Take 1 tablet (15 mg total) by mouth at bedtime. 30 tablet 1  . morphine (MS CONTIN) 15 MG 12 hr tablet Take 1 tablet (15 mg total) by mouth every 12 (twelve) hours. 60 tablet 0  . ondansetron (ZOFRAN) 4 MG tablet Take 1 tablet (4 mg total) by mouth every 8 (eight) hours as needed for nausea or vomiting. 20 tablet 0  . pantoprazole (PROTONIX) 40 MG tablet Take 1 tablet (40 mg total) by mouth daily. 30 tablet 0  . prochlorperazine (COMPAZINE) 10 MG tablet Take 1 tablet (10 mg total) by mouth every 6 (six) hours as needed for nausea or vomiting. 30 tablet 3  . traMADol (ULTRAM) 50 MG tablet Take 1-2 tablets (50-100 mg total) by mouth every 6 (six) hours as needed for moderate pain. 30 tablet 0   No current facility-administered medications for this visit.   Facility-Administered Medications Ordered in Other  Visits  Medication Dose Route Frequency Provider Last Rate Last Dose  . sodium chloride 0.9 % injection 10 mL  10 mL Intravenous PRN Truitt Merle, MD   10 mL at 12/31/14 0900  . sodium chloride 0.9 % injection 10 mL  10 mL Intravenous PRN Truitt Merle, MD        REVIEW OF SYSTEMS:   Constitutional: Denies fevers, chills or abnormal night sweats, no recent weight loss Eyes: Denies blurriness of vision, double vision or watery eyes Ears, nose, mouth, throat, and face: Denies mucositis or sore throat Respiratory: Denies cough, dyspnea or wheezes Cardiovascular: Denies palpitation, chest discomfort or lower extremity swelling Gastrointestinal:  Less nausea,  abdominal bloating and discomfort Skin: Denies abnormal skin rashes Lymphatics: Denies new lymphadenopathy or easy bruising Neurological:Denies numbness, tingling or new weaknesses Behavioral/Psych: Mood is stable, no new changes  All other systems were reviewed with the patient and are negative except those mentioned in the history.  PHYSICAL EXAMINATION: ECOG PERFORMANCE STATUS: 1 BP 131/66 mmHg  Pulse 66  Temp(Src) 97.7 F (36.5 C) (Oral)  Resp 20  Ht 5' 6" (1.676 m)  Wt 284 lb (128.822 kg)  BMI 45.86 kg/m2  SpO2 98%  GENERAL:alert, no distress and comfortable SKIN: skin color, texture, turgor are normal, no rashes or significant lesions EYES: normal, conjunctiva are pink and non-injected, sclera clear OROPHARYNX:no exudate, no erythema and lips, buccal mucosa, and tongue normal  NECK: supple, thyroid normal size, non-tender, without nodularity LYMPH:  no palpable lymphadenopathy in the cervical, axillary or inguinal LUNGS: clear to auscultation and percussion with normal breathing effort HEART: regular rate & rhythm and no murmurs and no lower extremity edema ABDOMEN:abdomen soft, non-tender and normal bowel sounds, (+) moderate amount of ascites. Musculoskeletal:no cyanosis of digits and no clubbing  PSYCH: alert & oriented x 3 with fluent speech NEURO: no focal motor/sensory deficits  LABORATORY DATA:  I have reviewed the data as listed CBC Latest Ref Rng 04/29/2015 04/15/2015 04/08/2015  WBC 4.0 - 10.3 10e3/uL 5.8 4.0 1.7(L)  Hemoglobin 13.0 - 17.1 g/dL 11.7(L) 11.8(L) 11.9(L)  Hematocrit 38.4 - 49.9 % 36.6(L) 36.3(L) 37.5(L)  Platelets 140 - 400 10e3/uL 178 240 167      CMP Latest Ref Rng 04/29/2015 04/15/2015 04/08/2015  Glucose 70 - 140 mg/dl 91 86 98  BUN 7.0 - 26.0 mg/dL 14.7 22.7 15.1  Creatinine 0.7 - 1.3 mg/dL 0.9 0.9 0.8  Sodium 136 - 145 mEq/L 140 142 139  Potassium 3.5 - 5.1 mEq/L 3.8 4.3 4.0  Chloride 96 - 112 mEq/L - - -  CO2 22 - 29 mEq/L _0 Calcium 8.4 - 10.4 mg/dL 9.0 8.9 8.8  Total Protein 6.4 - 8.3 g/dL 6.8 6.7 6.5  Total Bilirubin 0.20 - 1.20 mg/dL 0.38 0.33 0.47  Alkaline Phos 40 - 150 U/L 167(H) 164(H) 147  AST 5 - 34 U/L 32 31 30  ALT 0 - 55 U/L _1 CEA  Status: Finalresult Visible to patient:  MyChart Nextappt: Today at 08:45 AM in Oncology ([place] [facility]) Dx:  Gastric adenocarcinoma: Stage IV           Ref Range 2wk ago (04/15/15) 67moago (03/25/15) 294mogo (02/25/15) 33m33moo (02/11/15)    CEA 0.0 - 5.0 ng/mL 3.7 5.3 (H) 3.9 3.6          Pathology report  Diagnosis  06/07/2014 PERITONEAL/ASCITIC FLUID(SPECIMEN 1 OF 1 COLLECTED 04/26/14): METASTATIC ADENOCARCINOMA WITH  SIGNET RING CELL FEATURES.  Stomach, biopsy, r/o cancer 04/25/14 - ADENOCARCINOMA WITH SIGNET RING FEATURES. Microscopic Comment The results were called to Dr. Ardis Hughs on 04/26/2014. (JDP:kh 04/26/14) Claudette Laws MD Pathologist, Electronic Signature (Case signed 04/26/2014)  HER-2/NEU BY CISH - NO AMPLIFICATION OF HER-2 DETECTED. RESULT RATIO OF HER2: CEP 17 SIGNALS 1.20 AVERAGE HER2 COPY NUMBER PER CELL 2.10  ADDITIONAL INFORMATION: Mismatch Repair (MMR) Protein Immunohistochemistry (IHC) IHC Expression Result: MLH1: Preserved nuclear expression (greater 50% tumor expression) MSH2: Preserved nuclear expression (greater 50% tumor expression) MSH6: Preserved nuclear expression (greater 50% tumor expression) PMS2: Preserved nuclear expression (greater 50% tumor expression) * Internal control demonstrates intact nuclear expression Interpretation: NORMAL There is preserved expression of the major and minor MMR proteins. There is a very low probability that microsatellite instability (MSI) is present. However, certain clinically significant MMR protein mutations may result in preservation of nuclear expression. It is recommended that the preservation of protein expression be correlated with  molecular based MSI testing.  [facility] [employer] testing: (+) JS28 and NF1   RADIOGRAPHIC STUDIES: I have personally reviewed the radiological images as listed and agreed with the findings in the report.  Ct Chest, abdomen and pelvis Wo Contrast12/24/2015    IMPRESSION:  1. Findings, as above, highly concerning for either esophageal or gastric primary malignancy, with metastatic lymphadenopathy in the upper abdomen and retroperitoneum, widespread intraperitoneal spread of disease, malignant ascites, and multiple pulmonary nodules in the visualize lung bases concerning for pulmonary metastasis. Oncologic evaluation is recommended.  2. Additional incidental findings, as above. These results were called by telephone at the time of interpretation on 04/23/2014 at 7:53 pm to [place] , who verbally acknowledged these results.   Electronically Signed   By: Vinnie Langton M.D.   On: 04/23/2014 19:56   CT CAP 06/24/2014 IMPRESSION: 1. Progressive low density and calcifications within the extensive upper abdominal lymphadenopathy, likely related to partial treatment. Some of the lymph nodes do appear slightly larger. The irregular infiltrating mass involving the gastric cardia appears slightly improved. 2. No evidence of hepatic metastatic disease. 3. Overall volume of ascites has improved, although extensive changes of peritoneal carcinomatosis have not significantly changed. 4. No significant change in bilateral pulmonary nodules consistent with metastatic disease. 5. Resolution of bilateral pleural effusions.  CT chest, abdomen and pelvis with contrast on 08/26/2014 IMPRESSION: Overall stable to slight progression of disease. Existing pulmonary nodules are stable to slightly progressed with no new pulmonary nodules evident. The bulky upper abdominal lymphadenopathy is stable to minimally progressed while no substantial change in the primary infiltrating gastric neoplasm is  discernible. Omental disease appears stable. There is slightly more ascites on today's study.  CT chest, abdomen and pelvis with contrast on 10/21/2014 IMPRESSION: 1. Compared to the prior examination there appears to be slight progression of disease predominantly manifest by worsening gastric mucosal thickening, particularly along the greater curvature of the stomach in the region of the cardia and fundus, with increasing adjacent mesenteric/omental disease and slight increase in malignant ascites. Previously described upper abdominal lymphadenopathy is generally stable to slightly decreased compared to the prior examination, and previously noted pulmonary nodules are unchanged. 2. Additional incidental findings, as above.  CT chest, abdomen and pelvis with contrast, 03/20/2015 at [facility] IMPRESSION: The abdominal pelvic ascites has increased. Infiltration of the omentum has increased likely representing increasing peritoneal disease.  Some of the left gastric adenopathy is a few millimeters smaller.  No real change in the metastatic pulmonary nodules.  EGD WITH BIOPSY 04/25/2014 ENDOSCOPIC IMPRESSION: There was a large, nearly circumferential, ulcerated, clearly malignant mass that stradles the GE junction. The vast bulk of the mass lays in the stomach, occupying about 1/3 of the stomach. The mass extends into the esophagus for about 4cm above the GE junction. Mutiple biopsies were taken from the gastric portion of the mass   I ASSESSMENT & PLAN:  39 year old gentleman without significant past medical history presents with abdominal bloating, nausea, vomiting, diarrhea, anemia and weight loss. EGD showed a large mass arising from the gastric fundus and extending into GE junction, biopsy showed adenocarcinoma with signet ring features, CT scan is consistent with peritoneal carcinomatosis and bilateral lung metastasis. Ascites cytology was positive for malignant cells.  1.  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 HER-2 negative, MMR-normal -I discussed his surgical biopsy results, cytology results, skin findings with patient and his family members, including his parents and 2 sisters. Unfortunately, this is widely metastatic, not curable, and overall prognosis is very poor. Literature has showed gastric adenocarcinoma with signet ring features has overall worse outcome comparing to others. His life expectancy is probably less than 6 months without treatment. -His tumor is negative for HER-2 overexpression, no role for trastuzumab. -he had excellent response to first-line chemotherapy FOLFOX, currently on second line for very due to disease progression. -His CEA is coming down again, clinically doing better, likely he is responding to treatment.  2. Recurrent malignant ascites -s/p paracentesis at the beginning of his diagnosis, and again on 03/10/15 and 03/24/15, and 11/23 -We'll monitor closely from now on  3. Anemia, multifactorial (iron deficient anemia, cancer related and chemotherapy induced ) -His ferritin level was normal at 171, but he has low iron and saturation, likely some degree of iron deficient anemia. -He has received IV Feraheme 510 mg twice, 2 weeks apart -Blood transfusion as needed during chemotherapy if her hemoglobin below 8. -Mild anemia has been stable.   Plan -continue chemotherapy FOLFIRI every 2 weeks, with Neulasta on day 3 --he still has moderate ascites on physical exam, not symptomatic, I'll schedule him for a repeated paracentesis for later next week.  -I'll see him back in 4 weeks -He is scheduled to follow up at [facility] on 05/18/2014 with a repeated CT scan.   The patient knows to call the clinic with any problems, questions or concerns.  I spent 20 minutes counseling the patient face to face. The total time spent in the appointment was 25  minutes and more than 50%  was on counseling.     Truitt Merle, MD 04/29/2015

## 2015-05-01 ENCOUNTER — Ambulatory Visit: Payer: BLUE CROSS/BLUE SHIELD

## 2015-05-01 ENCOUNTER — Ambulatory Visit (HOSPITAL_BASED_OUTPATIENT_CLINIC_OR_DEPARTMENT_OTHER): Payer: BLUE CROSS/BLUE SHIELD

## 2015-05-01 VITALS — BP 122/69 | HR 55 | Temp 96.9°F

## 2015-05-01 DIAGNOSIS — Z5189 Encounter for other specified aftercare: Secondary | ICD-10-CM

## 2015-05-01 DIAGNOSIS — C169 Malignant neoplasm of stomach, unspecified: Secondary | ICD-10-CM

## 2015-05-01 MED ORDER — PEGFILGRASTIM INJECTION 6 MG/0.6ML ~~LOC~~
6.0000 mg | PREFILLED_SYRINGE | Freq: Once | SUBCUTANEOUS | Status: AC
  Administered 2015-05-01: 6 mg via SUBCUTANEOUS
  Filled 2015-05-01: qty 0.6

## 2015-05-01 MED ORDER — HEPARIN SOD (PORK) LOCK FLUSH 100 UNIT/ML IV SOLN
500.0000 [IU] | Freq: Once | INTRAVENOUS | Status: AC
  Administered 2015-05-01: 500 [IU] via INTRAVENOUS
  Filled 2015-05-01: qty 5

## 2015-05-01 MED ORDER — SODIUM CHLORIDE 0.9 % IJ SOLN
10.0000 mL | INTRAMUSCULAR | Status: DC | PRN
  Administered 2015-05-01: 10 mL via INTRAVENOUS
  Filled 2015-05-01: qty 10

## 2015-05-01 NOTE — Progress Notes (Signed)
Neulasta injection given by infusion nurse after home pump discontinued 

## 2015-05-06 ENCOUNTER — Other Ambulatory Visit: Payer: BLUE CROSS/BLUE SHIELD

## 2015-05-06 ENCOUNTER — Ambulatory Visit: Payer: BLUE CROSS/BLUE SHIELD

## 2015-05-09 ENCOUNTER — Telehealth: Payer: Self-pay | Admitting: Hematology

## 2015-05-09 NOTE — Telephone Encounter (Signed)
Appointments completed by AR. Left message for patient confirming next appointment for 05/14/15 and patient to get new schedule when he comes in.

## 2015-05-13 ENCOUNTER — Other Ambulatory Visit: Payer: Self-pay | Admitting: Hematology

## 2015-05-14 ENCOUNTER — Other Ambulatory Visit (HOSPITAL_BASED_OUTPATIENT_CLINIC_OR_DEPARTMENT_OTHER): Payer: BLUE CROSS/BLUE SHIELD

## 2015-05-14 ENCOUNTER — Ambulatory Visit (HOSPITAL_BASED_OUTPATIENT_CLINIC_OR_DEPARTMENT_OTHER): Payer: BLUE CROSS/BLUE SHIELD

## 2015-05-14 ENCOUNTER — Encounter: Payer: Self-pay | Admitting: *Deleted

## 2015-05-14 VITALS — BP 119/68 | HR 78 | Temp 97.7°F | Resp 18

## 2015-05-14 DIAGNOSIS — Z5111 Encounter for antineoplastic chemotherapy: Secondary | ICD-10-CM | POA: Diagnosis not present

## 2015-05-14 DIAGNOSIS — C169 Malignant neoplasm of stomach, unspecified: Secondary | ICD-10-CM | POA: Diagnosis not present

## 2015-05-14 DIAGNOSIS — C786 Secondary malignant neoplasm of retroperitoneum and peritoneum: Secondary | ICD-10-CM

## 2015-05-14 DIAGNOSIS — C801 Malignant (primary) neoplasm, unspecified: Secondary | ICD-10-CM

## 2015-05-14 LAB — COMPREHENSIVE METABOLIC PANEL
ALK PHOS: 183 U/L — AB (ref 40–150)
ALT: 17 U/L (ref 0–55)
AST: 23 U/L (ref 5–34)
Albumin: 3 g/dL — ABNORMAL LOW (ref 3.5–5.0)
Anion Gap: 8 mEq/L (ref 3–11)
BILIRUBIN TOTAL: 0.49 mg/dL (ref 0.20–1.20)
BUN: 18.2 mg/dL (ref 7.0–26.0)
CHLORIDE: 107 meq/L (ref 98–109)
CO2: 26 mEq/L (ref 22–29)
CREATININE: 0.8 mg/dL (ref 0.7–1.3)
Calcium: 8.9 mg/dL (ref 8.4–10.4)
EGFR: >90 mL/min/{1.73_m2} (ref >90)
Glucose: 97 mg/dl (ref 70–140)
Potassium: 3.9 mEq/L (ref 3.5–5.1)
SODIUM: 142 meq/L (ref 136–145)
TOTAL PROTEIN: 7 g/dL (ref 6.4–8.3)

## 2015-05-14 LAB — CBC WITH DIFFERENTIAL/PLATELET
BASO%: 0.3 % (ref 0.0–2.0)
Basophils Absolute: 0 10*3/uL (ref 0.0–0.1)
EOS ABS: 0.2 10*3/uL (ref 0.0–0.5)
EOS%: 2.4 % (ref 0.0–7.0)
HCT: 34.7 % — ABNORMAL LOW (ref 38.4–49.9)
HGB: 11.1 g/dL — ABNORMAL LOW (ref 13.0–17.1)
LYMPH%: 25 % (ref 14.0–49.0)
MCH: 28.7 pg (ref 27.2–33.4)
MCHC: 32 g/dL (ref 32.0–36.0)
MCV: 89.7 fL (ref 79.3–98.0)
MONO#: 0.9 10*3/uL (ref 0.1–0.9)
MONO%: 13.4 % (ref 0.0–14.0)
NEUT%: 58.9 % (ref 39.0–75.0)
NEUTROS ABS: 4.1 10*3/uL (ref 1.5–6.5)
Platelets: 175 10*3/uL (ref 140–400)
RBC: 3.87 10*6/uL — AB (ref 4.20–5.82)
RDW: 16.5 % — ABNORMAL HIGH (ref 11.0–14.6)
WBC: 7 10*3/uL (ref 4.0–10.3)
lymph#: 1.8 10*3/uL (ref 0.9–3.3)

## 2015-05-14 MED ORDER — DEXAMETHASONE SODIUM PHOSPHATE 100 MG/10ML IJ SOLN
Freq: Once | INTRAMUSCULAR | Status: AC
  Administered 2015-05-14: 14:00:00 via INTRAVENOUS
  Filled 2015-05-14: qty 8

## 2015-05-14 MED ORDER — SODIUM CHLORIDE 0.9 % IV SOLN
Freq: Once | INTRAVENOUS | Status: AC
  Administered 2015-05-14: 13:00:00 via INTRAVENOUS

## 2015-05-14 MED ORDER — PROCHLORPERAZINE MALEATE 10 MG PO TABS
ORAL_TABLET | ORAL | Status: AC
  Filled 2015-05-14: qty 1

## 2015-05-14 MED ORDER — PROCHLORPERAZINE MALEATE 10 MG PO TABS
10.0000 mg | ORAL_TABLET | Freq: Once | ORAL | Status: AC
  Administered 2015-05-14: 10 mg via ORAL

## 2015-05-14 MED ORDER — LEUCOVORIN CALCIUM INJECTION 100 MG
20.0000 mg/m2 | Freq: Once | INTRAMUSCULAR | Status: AC
  Administered 2015-05-14: 50 mg via INTRAVENOUS
  Filled 2015-05-14: qty 2.5

## 2015-05-14 MED ORDER — IRINOTECAN HCL CHEMO INJECTION 100 MG/5ML
180.0000 mg/m2 | Freq: Once | INTRAVENOUS | Status: AC
  Administered 2015-05-14: 448 mg via INTRAVENOUS
  Filled 2015-05-14: qty 22.4

## 2015-05-14 MED ORDER — ATROPINE SULFATE 1 MG/ML IJ SOLN
0.5000 mg | Freq: Once | INTRAMUSCULAR | Status: AC | PRN
  Administered 2015-05-14: 0.5 mg via INTRAVENOUS

## 2015-05-14 MED ORDER — FLUOROURACIL CHEMO INJECTION 5 GM/100ML
2200.0000 mg/m2 | INTRAVENOUS | Status: DC
  Administered 2015-05-14: 5500 mg via INTRAVENOUS
  Filled 2015-05-14: qty 110

## 2015-05-14 MED ORDER — ATROPINE SULFATE 1 MG/ML IJ SOLN
INTRAMUSCULAR | Status: AC
  Filled 2015-05-14: qty 1

## 2015-05-14 NOTE — Patient Instructions (Signed)
[facility] Discharge Instructions for Patients Receiving Chemotherapy  Today you received the following chemotherapy agents: Irinotecan, Leucovorin and 5FU  To help prevent nausea and vomiting after your treatment, we encourage you to take your nausea medication:Compazine 10 mg every 6 hours as needed; Zofran 4 mg every 8 hours as needed.   If you develop nausea and vomiting that is not controlled by your nausea medication, call the clinic.   BELOW ARE SYMPTOMS THAT SHOULD BE REPORTED IMMEDIATELY:  *FEVER GREATER THAN 100.5 F  *CHILLS WITH OR WITHOUT FEVER  NAUSEA AND VOMITING THAT IS NOT CONTROLLED WITH YOUR NAUSEA MEDICATION  *UNUSUAL SHORTNESS OF BREATH  *UNUSUAL BRUISING OR BLEEDING  TENDERNESS IN MOUTH AND THROAT WITH OR WITHOUT PRESENCE OF ULCERS  *URINARY PROBLEMS  *BOWEL PROBLEMS  UNUSUAL RASH Items with * indicate a potential emergency and should be followed up as soon as possible.  Feel free to call the clinic you have any questions or concerns. The clinic phone number is [phone].  Please show the [place] at check-in to the Emergency Department and triage nurse.

## 2015-05-14 NOTE — Progress Notes (Signed)
Oncology Nurse Navigator Documentation  Oncology Nurse Navigator Flowsheets 05/14/2015  Navigator Location [facility]  Navigator Encounter Type 3 month;Treatment  Patient Visit Type MedOnc  Treatment Phase Treatment # 4 FOLFIRI & Avastin  Barriers/Navigation Needs No barriers at this time  Interventions None required  Support Groups/Services -  Acuity Level 1  Time Spent with Patient -  Going to [facility] on 19/17 for scans and to see surgeon. Feeling well and still able to work.

## 2015-05-15 LAB — CEA: CEA: 2.9 ng/mL (ref 0.0–5.0)

## 2015-05-16 ENCOUNTER — Ambulatory Visit (HOSPITAL_BASED_OUTPATIENT_CLINIC_OR_DEPARTMENT_OTHER): Payer: BLUE CROSS/BLUE SHIELD

## 2015-05-16 ENCOUNTER — Ambulatory Visit: Payer: BLUE CROSS/BLUE SHIELD

## 2015-05-16 ENCOUNTER — Ambulatory Visit (HOSPITAL_COMMUNITY)
Admission: RE | Admit: 2015-05-16 | Discharge: 2015-05-16 | Disposition: A | Discharge status: Home / Self Care | Payer: BLUE CROSS/BLUE SHIELD | Source: Ambulatory Visit | Attending: Hematology | Admitting: Hematology

## 2015-05-16 VITALS — BP 114/70 | HR 57 | Temp 98.0°F | Resp 16

## 2015-05-16 DIAGNOSIS — Z5189 Encounter for other specified aftercare: Secondary | ICD-10-CM | POA: Diagnosis not present

## 2015-05-16 DIAGNOSIS — C169 Malignant neoplasm of stomach, unspecified: Secondary | ICD-10-CM

## 2015-05-16 DIAGNOSIS — R188 Other ascites: Secondary | ICD-10-CM | POA: Insufficient documentation

## 2015-05-16 MED ORDER — HEPARIN SOD (PORK) LOCK FLUSH 100 UNIT/ML IV SOLN
500.0000 [IU] | Freq: Once | INTRAVENOUS | Status: AC | PRN
  Administered 2015-05-16: 500 [IU]
  Filled 2015-05-16: qty 5

## 2015-05-16 MED ORDER — PEGFILGRASTIM INJECTION 6 MG/0.6ML ~~LOC~~
6.0000 mg | PREFILLED_SYRINGE | Freq: Once | SUBCUTANEOUS | Status: AC
  Administered 2015-05-16: 6 mg via SUBCUTANEOUS
  Filled 2015-05-16: qty 0.6

## 2015-05-16 MED ORDER — SODIUM CHLORIDE 0.9 % IJ SOLN
10.0000 mL | INTRAMUSCULAR | Status: DC | PRN
  Administered 2015-05-16: 10 mL
  Filled 2015-05-16: qty 10

## 2015-05-16 NOTE — Procedures (Signed)
Ultrasound-guided therapeutic paracentesis performed yielding 1.3 L slightly hazy, yellow fluid. No immediate complications.

## 2015-05-16 NOTE — Patient Instructions (Signed)
Pegfilgrastim injection What is this medicine? PEGFILGRASTIM (PEG fil gra stim) is a long-acting granulocyte colony-stimulating factor that stimulates the growth of neutrophils, a type of white blood cell important in the body's fight against infection. It is used to reduce the incidence of fever and infection in patients with certain types of cancer who are receiving chemotherapy that affects the bone marrow, and to increase survival after being exposed to high doses of radiation. This medicine may be used for other purposes; ask your health care provider or pharmacist if you have questions. What should I tell my health care provider before I take this medicine? They need to know if you have any of these conditions: -kidney disease -latex allergy -ongoing radiation therapy -sickle cell disease -skin reactions to acrylic adhesives (On-Body Injector only) -an unusual or allergic reaction to pegfilgrastim, filgrastim, other medicines, foods, dyes, or preservatives -pregnant or trying to get pregnant -breast-feeding How should I use this medicine? This medicine is for injection under the skin. If you get this medicine at home, you will be taught how to prepare and give the pre-filled syringe or how to use the On-body Injector. Refer to the patient Instructions for Use for detailed instructions. Use exactly as directed. Take your medicine at regular intervals. Do not take your medicine more often than directed. It is important that you put your used needles and syringes in a special sharps container. Do not put them in a trash can. If you do not have a sharps container, call your pharmacist or healthcare provider to get one. Talk to your pediatrician regarding the use of this medicine in children. While this drug may be prescribed for selected conditions, precautions do apply. Overdosage: If you think you have taken too much of this medicine contact a poison control center or emergency room at  once. NOTE: This medicine is only for you. Do not share this medicine with others. What if I miss a dose? It is important not to miss your dose. Call your doctor or health care professional if you miss your dose. If you miss a dose due to an On-body Injector failure or leakage, a new dose should be administered as soon as possible using a single prefilled syringe for manual use. What may interact with this medicine? Interactions have not been studied. Give your health care provider a list of all the medicines, herbs, non-prescription drugs, or dietary supplements you use. Also tell them if you smoke, drink alcohol, or use illegal drugs. Some items may interact with your medicine. This list may not describe all possible interactions. Give your health care provider a list of all the medicines, herbs, non-prescription drugs, or dietary supplements you use. Also tell them if you smoke, drink alcohol, or use illegal drugs. Some items may interact with your medicine. What should I watch for while using this medicine? You may need blood work done while you are taking this medicine. If you are going to need a MRI, CT scan, or other procedure, tell your doctor that you are using this medicine (On-Body Injector only). What side effects may I notice from receiving this medicine? Side effects that you should report to your doctor or health care professional as soon as possible: -allergic reactions like skin rash, itching or hives, swelling of the face, lips, or tongue -dizziness -fever -pain, redness, or irritation at site where injected -pinpoint red spots on the skin -red or dark-brown urine -shortness of breath or breathing problems -stomach or side pain, or pain   at the shoulder -swelling -tiredness -trouble passing urine or change in the amount of urine Side effects that usually do not require medical attention (report to your doctor or health care professional if they continue or are  bothersome): -bone pain -muscle pain This list may not describe all possible side effects. Call your doctor for medical advice about side effects. You may report side effects to FDA at 1-800-FDA-1088. Where should I keep my medicine? Keep out of the reach of children. Store pre-filled syringes in a refrigerator between 2 and 8 degrees C (36 and 46 degrees F). Do not freeze. Keep in carton to protect from light. Throw away this medicine if it is left out of the refrigerator for more than 48 hours. Throw away any unused medicine after the expiration date. NOTE: This sheet is a summary. It may not cover all possible information. If you have questions about this medicine, talk to your doctor, pharmacist, or health care provider.    2016, [employer]. (2014-05-16 14:30:14)  

## 2015-05-20 ENCOUNTER — Ambulatory Visit: Payer: BLUE CROSS/BLUE SHIELD

## 2015-05-20 ENCOUNTER — Ambulatory Visit: Payer: BLUE CROSS/BLUE SHIELD | Admitting: Hematology

## 2015-05-20 ENCOUNTER — Other Ambulatory Visit: Payer: BLUE CROSS/BLUE SHIELD

## 2015-05-26 ENCOUNTER — Other Ambulatory Visit (HOSPITAL_BASED_OUTPATIENT_CLINIC_OR_DEPARTMENT_OTHER): Payer: BLUE CROSS/BLUE SHIELD

## 2015-05-26 ENCOUNTER — Encounter: Payer: Self-pay | Admitting: Hematology

## 2015-05-26 ENCOUNTER — Ambulatory Visit (HOSPITAL_BASED_OUTPATIENT_CLINIC_OR_DEPARTMENT_OTHER): Payer: BLUE CROSS/BLUE SHIELD | Admitting: Hematology

## 2015-05-26 ENCOUNTER — Other Ambulatory Visit: Payer: BLUE CROSS/BLUE SHIELD

## 2015-05-26 ENCOUNTER — Telehealth: Payer: Self-pay | Admitting: Hematology

## 2015-05-26 VITALS — BP 134/67 | HR 71 | Temp 97.0°F | Resp 18 | Ht 66.0 in | Wt 282.0 lb

## 2015-05-26 DIAGNOSIS — D509 Iron deficiency anemia, unspecified: Secondary | ICD-10-CM

## 2015-05-26 DIAGNOSIS — D6481 Anemia due to antineoplastic chemotherapy: Secondary | ICD-10-CM

## 2015-05-26 DIAGNOSIS — C7800 Secondary malignant neoplasm of unspecified lung: Secondary | ICD-10-CM | POA: Diagnosis not present

## 2015-05-26 DIAGNOSIS — C169 Malignant neoplasm of stomach, unspecified: Secondary | ICD-10-CM

## 2015-05-26 DIAGNOSIS — D638 Anemia in other chronic diseases classified elsewhere: Secondary | ICD-10-CM

## 2015-05-26 DIAGNOSIS — C786 Secondary malignant neoplasm of retroperitoneum and peritoneum: Secondary | ICD-10-CM

## 2015-05-26 DIAGNOSIS — C801 Malignant (primary) neoplasm, unspecified: Secondary | ICD-10-CM

## 2015-05-26 DIAGNOSIS — D630 Anemia in neoplastic disease: Secondary | ICD-10-CM

## 2015-05-26 DIAGNOSIS — R180 Malignant ascites: Secondary | ICD-10-CM

## 2015-05-26 LAB — COMPREHENSIVE METABOLIC PANEL
ALBUMIN: 3.2 g/dL — AB (ref 3.5–5.0)
ALK PHOS: 206 U/L — AB (ref 40–150)
ALT: 24 U/L (ref 0–55)
AST: 24 U/L (ref 5–34)
Anion Gap: 9 mEq/L (ref 3–11)
BUN: 19.7 mg/dL (ref 7.0–26.0)
CALCIUM: 9.1 mg/dL (ref 8.4–10.4)
CHLORIDE: 108 meq/L (ref 98–109)
CO2: 25 mEq/L (ref 22–29)
CREATININE: 0.9 mg/dL (ref 0.7–1.3)
EGFR: >90 mL/min/{1.73_m2} (ref >90)
GLUCOSE: 129 mg/dL (ref 70–140)
Potassium: 3.8 mEq/L (ref 3.5–5.1)
SODIUM: 142 meq/L (ref 136–145)
Total Bilirubin: 0.38 mg/dL (ref 0.20–1.20)
Total Protein: 7.2 g/dL (ref 6.4–8.3)

## 2015-05-26 LAB — CBC WITH DIFFERENTIAL/PLATELET
BASO%: 0.4 % (ref 0.0–2.0)
Basophils Absolute: 0 10*3/uL (ref 0.0–0.1)
EOS%: 1.5 % (ref 0.0–7.0)
Eosinophils Absolute: 0.1 10*3/uL (ref 0.0–0.5)
HEMATOCRIT: 34.4 % — AB (ref 38.4–49.9)
HEMOGLOBIN: 11.2 g/dL — AB (ref 13.0–17.1)
LYMPH#: 1.5 10*3/uL (ref 0.9–3.3)
LYMPH%: 29 % (ref 14.0–49.0)
MCH: 29.3 pg (ref 27.2–33.4)
MCHC: 32.6 g/dL (ref 32.0–36.0)
MCV: 90.1 fL (ref 79.3–98.0)
MONO#: 0.7 10*3/uL (ref 0.1–0.9)
MONO%: 13.9 % (ref 0.0–14.0)
NEUT#: 2.9 10*3/uL (ref 1.5–6.5)
NEUT%: 55.2 % (ref 39.0–75.0)
Platelets: 158 10*3/uL (ref 140–400)
RBC: 3.82 10*6/uL — ABNORMAL LOW (ref 4.20–5.82)
RDW: 16.7 % — AB (ref 11.0–14.6)
WBC: 5.3 10*3/uL (ref 4.0–10.3)

## 2015-05-26 NOTE — Telephone Encounter (Signed)
Talked to patient here in office. Scheduled appt.       [facility]. °
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
## 2015-05-27 ENCOUNTER — Ambulatory Visit (HOSPITAL_BASED_OUTPATIENT_CLINIC_OR_DEPARTMENT_OTHER): Payer: BLUE CROSS/BLUE SHIELD

## 2015-05-27 ENCOUNTER — Ambulatory Visit: Payer: BLUE CROSS/BLUE SHIELD | Admitting: Hematology

## 2015-05-27 ENCOUNTER — Other Ambulatory Visit: Payer: BLUE CROSS/BLUE SHIELD

## 2015-05-27 VITALS — BP 123/67 | HR 65 | Temp 98.9°F | Resp 18

## 2015-05-27 DIAGNOSIS — Z5111 Encounter for antineoplastic chemotherapy: Secondary | ICD-10-CM | POA: Diagnosis not present

## 2015-05-27 DIAGNOSIS — C169 Malignant neoplasm of stomach, unspecified: Secondary | ICD-10-CM | POA: Diagnosis not present

## 2015-05-27 MED ORDER — SODIUM CHLORIDE 0.9 % IV SOLN
Freq: Once | INTRAVENOUS | Status: AC
  Administered 2015-05-27: 10:00:00 via INTRAVENOUS

## 2015-05-27 MED ORDER — LEUCOVORIN CALCIUM INJECTION 100 MG
20.0000 mg/m2 | Freq: Once | INTRAMUSCULAR | Status: AC
  Administered 2015-05-27: 50 mg via INTRAVENOUS
  Filled 2015-05-27: qty 2.5

## 2015-05-27 MED ORDER — IRINOTECAN HCL CHEMO INJECTION 100 MG/5ML
180.0000 mg/m2 | Freq: Once | INTRAVENOUS | Status: AC
  Administered 2015-05-27: 448 mg via INTRAVENOUS
  Filled 2015-05-27: qty 22.4

## 2015-05-27 MED ORDER — ATROPINE SULFATE 1 MG/ML IJ SOLN
INTRAMUSCULAR | Status: AC
  Filled 2015-05-27: qty 1

## 2015-05-27 MED ORDER — FLUOROURACIL CHEMO INJECTION 5 GM/100ML
2200.0000 mg/m2 | INTRAVENOUS | Status: DC
  Administered 2015-05-27: 5500 mg via INTRAVENOUS
  Filled 2015-05-27: qty 110

## 2015-05-27 MED ORDER — SODIUM CHLORIDE 0.9 % IV SOLN
Freq: Once | INTRAVENOUS | Status: AC
  Administered 2015-05-27: 10:00:00 via INTRAVENOUS
  Filled 2015-05-27: qty 8

## 2015-05-27 MED ORDER — PROCHLORPERAZINE MALEATE 10 MG PO TABS
10.0000 mg | ORAL_TABLET | Freq: Four times a day (QID) | ORAL | Status: DC | PRN
  Administered 2015-05-27: 10 mg via ORAL

## 2015-05-27 MED ORDER — PROCHLORPERAZINE MALEATE 10 MG PO TABS
ORAL_TABLET | ORAL | Status: AC
  Filled 2015-05-27: qty 1

## 2015-05-27 MED ORDER — ATROPINE SULFATE 1 MG/ML IJ SOLN
0.5000 mg | Freq: Once | INTRAMUSCULAR | Status: AC | PRN
Start: 2015-05-27 — End: 2015-05-27
  Administered 2015-05-27: 0.5 mg via INTRAVENOUS

## 2015-05-27 NOTE — Patient Instructions (Signed)
[facility] Discharge Instructions for Patients Receiving Chemotherapy  Today you received the following chemotherapy agents Camptosar, Leucovorin and Adrucil.  To help prevent nausea and vomiting after your treatment, we encourage you to take your nausea medication as prescribed.   If you develop nausea and vomiting that is not controlled by your nausea medication, call the clinic.   BELOW ARE SYMPTOMS THAT SHOULD BE REPORTED IMMEDIATELY:  *FEVER GREATER THAN 100.5 F  *CHILLS WITH OR WITHOUT FEVER  NAUSEA AND VOMITING THAT IS NOT CONTROLLED WITH YOUR NAUSEA MEDICATION  *UNUSUAL SHORTNESS OF BREATH  *UNUSUAL BRUISING OR BLEEDING  TENDERNESS IN MOUTH AND THROAT WITH OR WITHOUT PRESENCE OF ULCERS  *URINARY PROBLEMS  *BOWEL PROBLEMS  UNUSUAL RASH Items with * indicate a potential emergency and should be followed up as soon as possible.  Feel free to call the clinic you have any questions or concerns. The clinic phone number is [phone].  Please show the [place] at check-in to the Emergency Department and triage nurse.

## 2015-05-28 ENCOUNTER — Telehealth: Payer: Self-pay | Admitting: Hematology

## 2015-05-28 NOTE — Telephone Encounter (Signed)
Added tx - adjusted appointments for 1/31 due to lab/fu too late to start tx after. Left message for patient re change and new time for 1/31 appointments patient to get new schedule tomorrow.

## 2015-05-28 NOTE — Telephone Encounter (Signed)
per pof to sch pt appt-sent MW email to sch trmt for 2/14-will call pt after reply

## 2015-05-29 ENCOUNTER — Telehealth: Payer: Self-pay | Admitting: Hematology

## 2015-05-29 ENCOUNTER — Ambulatory Visit: Payer: BLUE CROSS/BLUE SHIELD

## 2015-05-29 ENCOUNTER — Ambulatory Visit (HOSPITAL_BASED_OUTPATIENT_CLINIC_OR_DEPARTMENT_OTHER): Payer: BLUE CROSS/BLUE SHIELD

## 2015-05-29 VITALS — BP 113/56 | HR 57 | Temp 97.5°F | Resp 16

## 2015-05-29 DIAGNOSIS — C169 Malignant neoplasm of stomach, unspecified: Secondary | ICD-10-CM

## 2015-05-29 DIAGNOSIS — Z5189 Encounter for other specified aftercare: Secondary | ICD-10-CM

## 2015-05-29 MED ORDER — HEPARIN SOD (PORK) LOCK FLUSH 100 UNIT/ML IV SOLN
500.0000 [IU] | Freq: Once | INTRAVENOUS | Status: AC | PRN
  Administered 2015-05-29: 500 [IU]
  Filled 2015-05-29: qty 5

## 2015-05-29 MED ORDER — PEGFILGRASTIM INJECTION 6 MG/0.6ML ~~LOC~~
6.0000 mg | PREFILLED_SYRINGE | Freq: Once | SUBCUTANEOUS | Status: AC
  Administered 2015-05-29: 6 mg via SUBCUTANEOUS
  Filled 2015-05-29: qty 0.6

## 2015-05-29 MED ORDER — SODIUM CHLORIDE 0.9 % IJ SOLN
10.0000 mL | INTRAMUSCULAR | Status: DC | PRN
  Administered 2015-05-29: 10 mL
  Filled 2015-05-29: qty 10

## 2015-05-29 NOTE — Telephone Encounter (Signed)
Pt came in asking to change 1/31 appt. It was moved to pm and that doesn't nsuit his sched. Gave pt new appts for lab/md 1/30 at 1.30 and chemo 1/31 at 8.45.

## 2015-05-29 NOTE — Patient Instructions (Signed)

## 2015-05-29 NOTE — Progress Notes (Signed)
Neulasta injection given by infusion nurse after home pump disconnected

## 2015-06-09 ENCOUNTER — Other Ambulatory Visit (HOSPITAL_BASED_OUTPATIENT_CLINIC_OR_DEPARTMENT_OTHER): Payer: BLUE CROSS/BLUE SHIELD

## 2015-06-09 ENCOUNTER — Encounter: Payer: Self-pay | Admitting: Hematology

## 2015-06-09 ENCOUNTER — Ambulatory Visit (HOSPITAL_BASED_OUTPATIENT_CLINIC_OR_DEPARTMENT_OTHER): Payer: BLUE CROSS/BLUE SHIELD | Admitting: Hematology

## 2015-06-09 VITALS — BP 130/68 | HR 79 | Temp 98.0°F | Resp 18 | Ht 66.0 in | Wt 284.3 lb

## 2015-06-09 DIAGNOSIS — D509 Iron deficiency anemia, unspecified: Secondary | ICD-10-CM

## 2015-06-09 DIAGNOSIS — R180 Malignant ascites: Secondary | ICD-10-CM

## 2015-06-09 DIAGNOSIS — C169 Malignant neoplasm of stomach, unspecified: Secondary | ICD-10-CM

## 2015-06-09 DIAGNOSIS — C7800 Secondary malignant neoplasm of unspecified lung: Secondary | ICD-10-CM

## 2015-06-09 DIAGNOSIS — C786 Secondary malignant neoplasm of retroperitoneum and peritoneum: Secondary | ICD-10-CM | POA: Diagnosis not present

## 2015-06-09 DIAGNOSIS — D630 Anemia in neoplastic disease: Secondary | ICD-10-CM

## 2015-06-09 DIAGNOSIS — C801 Malignant (primary) neoplasm, unspecified: Secondary | ICD-10-CM

## 2015-06-09 DIAGNOSIS — D638 Anemia in other chronic diseases classified elsewhere: Secondary | ICD-10-CM

## 2015-06-09 DIAGNOSIS — D6481 Anemia due to antineoplastic chemotherapy: Secondary | ICD-10-CM

## 2015-06-09 LAB — CBC WITH DIFFERENTIAL/PLATELET
BASO%: 0.7 % (ref 0.0–2.0)
BASOS ABS: 0 10*3/uL (ref 0.0–0.1)
EOS%: 1.7 % (ref 0.0–7.0)
Eosinophils Absolute: 0.1 10*3/uL (ref 0.0–0.5)
HEMATOCRIT: 35.8 % — AB (ref 38.4–49.9)
HEMOGLOBIN: 11.6 g/dL — AB (ref 13.0–17.1)
LYMPH#: 1.2 10*3/uL (ref 0.9–3.3)
LYMPH%: 17.1 % (ref 14.0–49.0)
MCH: 29 pg (ref 27.2–33.4)
MCHC: 32.5 g/dL (ref 32.0–36.0)
MCV: 89.4 fL (ref 79.3–98.0)
MONO#: 1.2 10*3/uL — ABNORMAL HIGH (ref 0.1–0.9)
MONO%: 16.9 % — AB (ref 0.0–14.0)
NEUT%: 63.6 % (ref 39.0–75.0)
NEUTROS ABS: 4.4 10*3/uL (ref 1.5–6.5)
Platelets: 167 10*3/uL (ref 140–400)
RBC: 4 10*6/uL — ABNORMAL LOW (ref 4.20–5.82)
RDW: 17.7 % — AB (ref 11.0–14.6)
WBC: 6.9 10*3/uL (ref 4.0–10.3)

## 2015-06-09 LAB — COMPREHENSIVE METABOLIC PANEL
ALBUMIN: 3.2 g/dL — AB (ref 3.5–5.0)
ALT: 20 U/L (ref 0–55)
AST: 23 U/L (ref 5–34)
Alkaline Phosphatase: 179 U/L — ABNORMAL HIGH (ref 40–150)
Anion Gap: 9 mEq/L (ref 3–11)
BUN: 18.9 mg/dL (ref 7.0–26.0)
CALCIUM: 9 mg/dL (ref 8.4–10.4)
CO2: 27 mEq/L (ref 22–29)
CREATININE: 1 mg/dL (ref 0.7–1.3)
Chloride: 107 mEq/L (ref 98–109)
EGFR: >90 mL/min/{1.73_m2} (ref >90)
GLUCOSE: 86 mg/dL (ref 70–140)
POTASSIUM: 4.3 meq/L (ref 3.5–5.1)
SODIUM: 143 meq/L (ref 136–145)
Total Bilirubin: 0.36 mg/dL (ref 0.20–1.20)
Total Protein: 7.1 g/dL (ref 6.4–8.3)

## 2015-06-09 NOTE — Progress Notes (Signed)
[place]  [phone] Fax:([phone]  Clinic follow-up Note   Patient Care Team: No Pcp Per Patient as PCP - General (General Practice) 06/09/2015  CHIEF COMPLAINTS Follow up metastatic gastric cancer    Gastric adenocarcinoma: Stage IV   04/23/2014 Imaging CT abdomen showed peritoneal carcinomatosis, ascites, thickning of gastric and GEJ wall, CT chest showed a few small b/l lung nodules.    04/24/2014 Tumor Marker CEA  5.6, CA19.9 13   04/25/2014 Initial Diagnosis Gastric adenocarcinoma: Stage IV   04/25/2014 Pathology Results Stomach mass biopsy showed - ADENOCARCINOMA WITH SIGNET RING FEATURES. ascites cytology (+)   04/25/2014 Procedure EGD: There was a large, nearly circumferential, ulcerated, clearly malignant mass that stradles the GE junction. The vast bulk of the mass lays in the stomach.    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 I saw him first when he was recently admitted to [facility]. He is here for the first office visit after hospital discharge.  He was  admitted on 12/15 with progressive, 4 month history of dysphagia, epigastric abdominal discomfort and bloating, early satiation accompanied by nausea, and postprandial vomiting and diarrhea. He has experienced a 30 lbs weight loss in the process. He has noted increased abdominal girth. Patient denies any shortness of breath, chest pain, GI bleed, hematuria or lower extremity swelling. He denies heavy alcohol intake. Denies tobacco abuse. Denies risk factors  for HIV or hepatitis. He denies any family history of GI malignancies.  CT of the abdomen and pelvis with contrast on 12/15 revealed several pulmonary nodules at the bases bilaterally, profound thickening of the distal esophagus extending beyond the gastroesophageal junction into the proximal stomach,cardia and fundus of the stomach, most evident along the lesser curvature. No pathologic dilatation of small bowel or colon. Extensive upper abdominal and retroperitoneal lymphadenopathy was visualized. Moderate volume of ascites, presumably malignant, was noted. Extensive soft tissue nodularity throughout the omentum, compatible with omental caking and widespread peritoneal metastasis. Additionally, there are other areas of enhancing nodularity along the peritoneal surface. Tiny ventral and umbilical hernias containing predominantly omental fat, including some omental implants. There are no aggressive appearing lytic or blastic lesions noted in the visualized portions of the skeleton.  He underwent an EGD by Dr. Ardis Hughs on 04/25/2014, which showed a large fungating mass that straddles the GE junction and occupying the upper 1/3 part of the stomach. Multiple biopsy was taken which showed adenocarcinoma with signet ring features. He also underwent a paracentesis with 4.5 L fluid is removed on 04/26/2014. Cytology was positive for adenocarcinoma with signet ring features, consistent with metastatic disease.  He was discharged to home on 04/27/2014. He did feel a slightly informant of his abdominal bloating after the paracentesis, but not much improvement other symptoms. He had Mediport placement and the second paracentesis was again 4 L fluids removal on 05/02/2014. His appetite is low, he drinks ensure 3 bottles a day, some soup and a very little other foods. His energy level is also low, but able to take care of all his daily needs, and  move around with about much difficulty. His abdominal discomfort/pain are  relatively controlled by morphine 15 mg at night and tramadol 2-3 times during the day. He denies any fever, chills, cough, chest pain or any in the other discomfort.  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  INTERIM HISTORY: Samuel Durham returns for follow up. He is doing well. He was seen at [facility] last week, and had CT scan done there which showed stable disease. He feels well overall, denies any pain, abdominal distension, nausea or other symptoms. He is eating well and weight is stable.   MEDICAL HISTORY:  Past Medical History  Diagnosis Date  . Medical history non-contributory   . gastric ca dx'd 04/2014    SURGICAL HISTORY: Past Surgical History  Procedure Laterality Date  . Squamous cell carcinoma excision  approx March 2015    removed from top of head  . Esophagogastroduodenoscopy (egd) with propofol N/A 04/25/2014    Procedure: ESOPHAGOGASTRODUODENOSCOPY (EGD) WITH PROPOFOL;  Surgeon: Milus Banister, MD;  Location: [facility] ENDOSCOPY;  Service: Endoscopy;  Laterality: N/A;    SOCIAL HISTORY: Social History   Social History  . Marital Status: Single    Spouse Name: N/A  . Number of Children: N/A  . Years of Education: N/A   Occupational History  . Not on file.   Social History Main Topics  . Smoking status: Never Smoker   . Smokeless tobacco: Never Used  . Alcohol Use: No  . Drug Use: No  . Sexual Activity: Not on file   Other Topics Concern  . Not on file   Social History Narrative   Single, lives alone with pet dog (rescue dog)   Works as Information systems manager; drives; independent   Middle child between #2 sisters-his mother and sisters are very supportive    FAMILY HISTORY: Family History  Problem Relation Age of Onset  . Breast cancer Sister 53   . Hypertension Mother   . Hypertension Father   Maternal aunt breast cancer at age of  64 Maternal cousin ovarian cancer at age of 100 Maternal ancle prostate cancer age of 54   ALLERGIES:  has No Known Allergies.  MEDICATIONS:  Current Outpatient Prescriptions  Medication Sig Dispense Refill  . docusate sodium 100 MG CAPS Take 100 mg by mouth 2 (two) times daily. 10 capsule 0  . lidocaine-prilocaine (EMLA) cream Apply 1 application topically as needed. 30 g 2  . mirtazapine (REMERON) 15 MG tablet Take 1 tablet (15 mg total) by mouth at bedtime. 30 tablet 1  . ondansetron (ZOFRAN) 4 MG tablet Take 1 tablet (4 mg total) by mouth every 8 (eight) hours as needed for nausea or vomiting. 20 tablet 0  . pantoprazole (PROTONIX) 40 MG tablet Take 1 tablet (40 mg total) by mouth daily. 30 tablet 0  . prochlorperazine (COMPAZINE) 10 MG tablet Take 1 tablet (10 mg total) by mouth every 6 (six) hours as needed for nausea or vomiting. 30 tablet 3  . traMADol (ULTRAM) 50 MG tablet Take 1-2 tablets (50-100 mg total) by mouth every 6 (six) hours as needed for moderate pain. 30 tablet 0  . morphine (MS CONTIN) 15 MG 12 hr tablet Take 1 tablet (15 mg total) by mouth every 12 (twelve) hours. (Patient not taking: Reported on 05/26/2015) 60 tablet 0   No current facility-administered medications for this visit.   Facility-Administered Medications  Ordered in Other Visits  Medication Dose Route Frequency Provider Last Rate Last Dose  . sodium chloride 0.9 % injection 10 mL  10 mL Intravenous PRN Truitt Merle, MD   10 mL at 12/31/14 0900  . sodium chloride 0.9 % injection 10 mL  10 mL Intravenous PRN Truitt Merle, MD        REVIEW OF SYSTEMS:   Constitutional: Denies fevers, chills or abnormal night sweats, no recent weight loss Eyes: Denies blurriness of vision, double vision or watery eyes Ears, nose, mouth, throat, and face: Denies mucositis or sore throat Respiratory: Denies cough, dyspnea or wheezes Cardiovascular: Denies palpitation, chest discomfort or lower extremity swelling Gastrointestinal:   Less nausea, abdominal bloating and discomfort Skin: Denies abnormal skin rashes Lymphatics: Denies new lymphadenopathy or easy bruising Neurological:Denies numbness, tingling or new weaknesses Behavioral/Psych: Mood is stable, no new changes  All other systems were reviewed with the patient and are negative except those mentioned in the history.  PHYSICAL EXAMINATION: ECOG PERFORMANCE STATUS: 1 BP 130/68 mmHg  Pulse 79  Temp(Src) 98 F (36.7 C) (Oral)  Resp 18  Ht _0  (1.676 m)  Wt 284 lb 4.8 oz (128.958 kg)  BMI 45.91 kg/m2  SpO2 100%  GENERAL:alert, no distress and comfortable SKIN: skin color, texture, turgor are normal, no rashes or significant lesions EYES: normal, conjunctiva are pink and non-injected, sclera clear OROPHARYNX:no exudate, no erythema and lips, buccal mucosa, and tongue normal  NECK: supple, thyroid normal size, non-tender, without nodularity LYMPH:  no palpable lymphadenopathy in the cervical, axillary or inguinal LUNGS: clear to auscultation and percussion with normal breathing effort HEART: regular rate & rhythm and no murmurs and no lower extremity edema ABDOMEN:abdomen soft, non-tender and normal bowel sounds, (+) moderate amount of ascites. Musculoskeletal:no cyanosis of digits and no clubbing  PSYCH: alert & oriented x 3 with fluent speech NEURO: no focal motor/sensory deficits  LABORATORY DATA:  I have reviewed the data as listed CBC Latest Ref Rng 06/09/2015 05/26/2015 05/14/2015  WBC 4.0 - 10.3 10e3/uL 6.9 5.3 7.0  Hemoglobin 13.0 - 17.1 g/dL 11.6(L) 11.2(L) 11.1(L)  Hematocrit 38.4 - 49.9 % 35.8(L) 34.4(L) 34.7(L)  Platelets 140 - 400 10e3/uL 167 158 175      CMP Latest Ref Rng 06/09/2015 05/26/2015 05/14/2015  Glucose 70 - 140 mg/dl 86 129 97  BUN 7.0 - 26.0 mg/dL 18.9 19.7 18.2  Creatinine 0.7 - 1.3 mg/dL 1.0 0.9 0.8  Sodium 136 - 145 mEq/L 143 142 142  Potassium 3.5 - 5.1 mEq/L 4.3 3.8 3.9  Chloride 96 - 112 mEq/L - - -  CO2 22 - 29 mEq/L  _1 Calcium 8.4 - 10.4 mg/dL 9.0 9.1 8.9  Total Protein 6.4 - 8.3 g/dL 7.1 7.2 7.0  Total Bilirubin 0.20 - 1.20 mg/dL 0.36 0.38 0.49  Alkaline Phos 40 - 150 U/L 179(H) 206(H) 183(H)  AST 5 - 34 U/L _2 ALT 0 - 55 U/L _3 CEA  Status: Finalresult Visible to patient:  MyChart Nextappt: 05/27/2015 at 09:00 AM in Oncology [facility] [id number] DNS) Dx:  Gastric adenocarcinoma: Stage IV           Ref Range 12d ago  64moago  214mogo     CEA 0.0 - 5.0 ng/mL 2.9 3.7 5.3 (H)          Pathology report  Diagnosis  06/07/2014 PERITONEAL/ASCITIC FLUID(SPECIMEN 1 OF 1 COLLECTED 04/26/14): METASTATIC ADENOCARCINOMA WITH SIGNET RING  CELL FEATURES.  Stomach, biopsy, r/o cancer 04/25/14 - ADENOCARCINOMA WITH SIGNET RING FEATURES. Microscopic Comment The results were called to Dr. Ardis Hughs on 04/26/2014. (JDP:kh 04/26/14) Claudette Laws MD Pathologist, Electronic Signature (Case signed 04/26/2014)  HER-2/NEU BY CISH - NO AMPLIFICATION OF HER-2 DETECTED. RESULT RATIO OF HER2: CEP 17 SIGNALS 1.20 AVERAGE HER2 COPY NUMBER PER CELL 2.10  ADDITIONAL INFORMATION: Mismatch Repair (MMR) Protein Immunohistochemistry (IHC) IHC Expression Result: MLH1: Preserved nuclear expression (greater 50% tumor expression) MSH2: Preserved nuclear expression (greater 50% tumor expression) MSH6: Preserved nuclear expression (greater 50% tumor expression) PMS2: Preserved nuclear expression (greater 50% tumor expression) * Internal control demonstrates intact nuclear expression Interpretation: NORMAL There is preserved expression of the major and minor MMR proteins. There is a very low probability that microsatellite instability (MSI) is present. However, certain clinically significant MMR protein mutations may result in preservation of nuclear expression. It is recommended that the preservation of protein expression be correlated with molecular based MSI testing.  [facility] [place] testing: (+) BO17 and NF1   RADIOGRAPHIC STUDIES: I have personally reviewed the radiological images as listed and agreed with the findings in the report  CT chest, abdomen and pelvis with contrast, 06/06/2015 at [facility] IMPRESSION: 1. Stable bilateral pulmonary nodules, possible metastases.  2. Stable calcified upper abdominal lymphadenopathy and calcified gastric tumor.  3. Decreased volume of ascites.  4. Extensive omental and peritoneal carcinomatosis, probably stable.  I ASSESSMENT & PLAN:  40 year old gentleman without significant past medical history presents with abdominal bloating, nausea, vomiting, diarrhea, anemia and weight loss. EGD showed a large mass arising from the gastric fundus and extending into GE junction, biopsy showed adenocarcinoma with signet ring features, CT scan is consistent with peritoneal carcinomatosis and bilateral lung metastasis. Ascites cytology was positive for malignant cells.  1. 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 HER-2 negative, MMR-normal -I discussed his surgical biopsy results, cytology results, scan findings with patient and his family members, including his parents and 2 sisters. Unfortunately, this is widely metastatic, not curable, and overall prognosis is very poor.  -His tumor is negative for HER-2 overexpression, no role for trastuzumab. -he had excellent response to first-line chemotherapy FOLFOX, currently on second line FOLFIRI due to disease progression. -I reviewed his recently restaging CT which was done at [facility], it showed stable disease.  -His CEA is coming down again, clinically doing better,  recurrent ascites has resolved, which is consistent with her good response to chemo. -surgical debulking and HIPEC was discussed by his oncologist Dr. Liane Comber at [facility] which she will discuss with her surgical colleagues, if his disease remains to be  controlled and lung nodule biopsy negative in 2 months  -continue chemo for 2 more month and he will follow up with [facility] again   2. Recurrent malignant ascites -s/p paracentesis at the beginning of his diagnosis, and again on 03/10/15 and 03/24/15, and 11/23, 12/6 and 1/6.  His ascites has near resolved again. -We'll  Continue monitoring  3. Anemia, multifactorial (iron deficient anemia, cancer related and chemotherapy induced ) -His ferritin level was normal at 171, but he has low iron and saturation, likely some degree of iron deficient anemia. -He has received IV Feraheme 510 mg twice, 2 weeks apart -Blood transfusion as needed during chemotherapy if her hemoglobin below 8. -Mild anemia has been stable.   Plan -continue chemotherapy FOLFIRI every 2 weeks, next cycle tomorrow, with Neulasta on day 3 -  he will follow-up at [facility] with a restaging CTs in 2 months  - I'll see him back in 4 weeks  The patient knows to call the clinic with any problems, questions or concerns.  I spent 20 minutes counseling the patient face to face. The total time spent in the appointment was 25  minutes and more than 50% was on counseling    Truitt Merle, MD 06/09/2015

## 2015-06-10 ENCOUNTER — Ambulatory Visit: Payer: BLUE CROSS/BLUE SHIELD | Admitting: Hematology

## 2015-06-10 ENCOUNTER — Ambulatory Visit (HOSPITAL_BASED_OUTPATIENT_CLINIC_OR_DEPARTMENT_OTHER): Payer: BLUE CROSS/BLUE SHIELD

## 2015-06-10 ENCOUNTER — Other Ambulatory Visit: Payer: BLUE CROSS/BLUE SHIELD

## 2015-06-10 ENCOUNTER — Ambulatory Visit: Payer: BLUE CROSS/BLUE SHIELD

## 2015-06-10 VITALS — BP 116/61 | HR 80 | Temp 97.7°F | Resp 18

## 2015-06-10 DIAGNOSIS — C169 Malignant neoplasm of stomach, unspecified: Secondary | ICD-10-CM

## 2015-06-10 DIAGNOSIS — Z5111 Encounter for antineoplastic chemotherapy: Secondary | ICD-10-CM | POA: Diagnosis not present

## 2015-06-10 MED ORDER — IRINOTECAN HCL CHEMO INJECTION 100 MG/5ML
180.0000 mg/m2 | Freq: Once | INTRAVENOUS | Status: AC
  Administered 2015-06-10: 448 mg via INTRAVENOUS
  Filled 2015-06-10: qty 22.4

## 2015-06-10 MED ORDER — ATROPINE SULFATE 1 MG/ML IJ SOLN
0.5000 mg | Freq: Once | INTRAMUSCULAR | Status: AC | PRN
Start: 2015-06-10 — End: 2015-06-10
  Administered 2015-06-10: 0.5 mg via INTRAVENOUS

## 2015-06-10 MED ORDER — SODIUM CHLORIDE 0.9 % IV SOLN
Freq: Once | INTRAVENOUS | Status: AC
  Administered 2015-06-10: 09:00:00 via INTRAVENOUS

## 2015-06-10 MED ORDER — SODIUM CHLORIDE 0.9 % IV SOLN
Freq: Once | INTRAVENOUS | Status: AC
  Administered 2015-06-10: 09:00:00 via INTRAVENOUS
  Filled 2015-06-10: qty 8

## 2015-06-10 MED ORDER — ATROPINE SULFATE 1 MG/ML IJ SOLN
INTRAMUSCULAR | Status: AC
  Filled 2015-06-10: qty 1

## 2015-06-10 MED ORDER — PROCHLORPERAZINE MALEATE 10 MG PO TABS
ORAL_TABLET | ORAL | Status: AC
  Filled 2015-06-10: qty 1

## 2015-06-10 MED ORDER — FLUOROURACIL CHEMO INJECTION 5 GM/100ML
2200.0000 mg/m2 | INTRAVENOUS | Status: DC
  Administered 2015-06-10: 5500 mg via INTRAVENOUS
  Filled 2015-06-10: qty 110

## 2015-06-10 MED ORDER — PROCHLORPERAZINE MALEATE 10 MG PO TABS
10.0000 mg | ORAL_TABLET | Freq: Four times a day (QID) | ORAL | Status: DC | PRN
  Administered 2015-06-10: 10 mg via ORAL

## 2015-06-10 MED ORDER — LEUCOVORIN CALCIUM INJECTION 100 MG
20.0000 mg/m2 | Freq: Once | INTRAMUSCULAR | Status: AC
  Administered 2015-06-10: 50 mg via INTRAVENOUS
  Filled 2015-06-10: qty 2.5

## 2015-06-10 NOTE — Patient Instructions (Signed)
[place] Discharge Instructions for Patients Receiving Chemotherapy  Today you received the following chemotherapy agents Leucovorin, Camptosar and Adrucil.  To help prevent nausea and vomiting after your treatment, we encourage you to take your nausea medication as prescribed.   If you develop nausea and vomiting that is not controlled by your nausea medication, call the clinic.   BELOW ARE SYMPTOMS THAT SHOULD BE REPORTED IMMEDIATELY:  *FEVER GREATER THAN 100.5 F  *CHILLS WITH OR WITHOUT FEVER  NAUSEA AND VOMITING THAT IS NOT CONTROLLED WITH YOUR NAUSEA MEDICATION  *UNUSUAL SHORTNESS OF BREATH  *UNUSUAL BRUISING OR BLEEDING  TENDERNESS IN MOUTH AND THROAT WITH OR WITHOUT PRESENCE OF ULCERS  *URINARY PROBLEMS  *BOWEL PROBLEMS  UNUSUAL RASH Items with * indicate a potential emergency and should be followed up as soon as possible.  Feel free to call the clinic you have any questions or concerns. The clinic phone number is [phone].  Please show the [place] at check-in to the Emergency Department and triage nurse.

## 2015-06-11 ENCOUNTER — Telehealth: Payer: Self-pay | Admitting: *Deleted

## 2015-06-11 ENCOUNTER — Telehealth: Payer: Self-pay | Admitting: Hematology

## 2015-06-11 NOTE — Telephone Encounter (Signed)
per pof tos ch pt appt-sent Dr Burr Medico email to see if pt needs inj after 2/2-awaiting reply to sch-tlkwed w/Myrtle and she adv to send staff message

## 2015-06-11 NOTE — Telephone Encounter (Signed)
Per staff message and POF I have scheduled appts. Advised scheduler of appts. JMW  

## 2015-06-12 ENCOUNTER — Ambulatory Visit: Payer: BLUE CROSS/BLUE SHIELD

## 2015-06-12 ENCOUNTER — Ambulatory Visit (HOSPITAL_BASED_OUTPATIENT_CLINIC_OR_DEPARTMENT_OTHER): Payer: BLUE CROSS/BLUE SHIELD

## 2015-06-12 VITALS — BP 130/56 | HR 62 | Temp 97.6°F | Resp 18

## 2015-06-12 DIAGNOSIS — C169 Malignant neoplasm of stomach, unspecified: Secondary | ICD-10-CM

## 2015-06-12 DIAGNOSIS — Z5189 Encounter for other specified aftercare: Secondary | ICD-10-CM

## 2015-06-12 MED ORDER — SODIUM CHLORIDE 0.9 % IJ SOLN
10.0000 mL | INTRAMUSCULAR | Status: DC | PRN
  Administered 2015-06-12: 10 mL
  Filled 2015-06-12: qty 10

## 2015-06-12 MED ORDER — PEGFILGRASTIM INJECTION 6 MG/0.6ML ~~LOC~~
6.0000 mg | PREFILLED_SYRINGE | Freq: Once | SUBCUTANEOUS | Status: AC
  Administered 2015-06-12: 6 mg via SUBCUTANEOUS
  Filled 2015-06-12: qty 0.6

## 2015-06-12 MED ORDER — HEPARIN SOD (PORK) LOCK FLUSH 100 UNIT/ML IV SOLN
500.0000 [IU] | Freq: Once | INTRAVENOUS | Status: AC | PRN
  Administered 2015-06-12: 500 [IU]
  Filled 2015-06-12: qty 5

## 2015-06-12 NOTE — Patient Instructions (Signed)
Pegfilgrastim injection What is this medicine? PEGFILGRASTIM (PEG fil gra stim) is a long-acting granulocyte colony-stimulating factor that stimulates the growth of neutrophils, a type of white blood cell important in the body's fight against infection. It is used to reduce the incidence of fever and infection in patients with certain types of cancer who are receiving chemotherapy that affects the bone marrow, and to increase survival after being exposed to high doses of radiation. This medicine may be used for other purposes; ask your health care provider or pharmacist if you have questions. What should I tell my health care provider before I take this medicine? They need to know if you have any of these conditions: -kidney disease -latex allergy -ongoing radiation therapy -sickle cell disease -skin reactions to acrylic adhesives (On-Body Injector only) -an unusual or allergic reaction to pegfilgrastim, filgrastim, other medicines, foods, dyes, or preservatives -pregnant or trying to get pregnant -breast-feeding How should I use this medicine? This medicine is for injection under the skin. If you get this medicine at home, you will be taught how to prepare and give the pre-filled syringe or how to use the On-body Injector. Refer to the patient Instructions for Use for detailed instructions. Use exactly as directed. Take your medicine at regular intervals. Do not take your medicine more often than directed. It is important that you put your used needles and syringes in a special sharps container. Do not put them in a trash can. If you do not have a sharps container, call your pharmacist or healthcare provider to get one. Talk to your pediatrician regarding the use of this medicine in children. While this drug may be prescribed for selected conditions, precautions do apply. Overdosage: If you think you have taken too much of this medicine contact a poison control center or emergency room at  once. NOTE: This medicine is only for you. Do not share this medicine with others. What if I miss a dose? It is important not to miss your dose. Call your doctor or health care professional if you miss your dose. If you miss a dose due to an On-body Injector failure or leakage, a new dose should be administered as soon as possible using a single prefilled syringe for manual use. What may interact with this medicine? Interactions have not been studied. Give your health care provider a list of all the medicines, herbs, non-prescription drugs, or dietary supplements you use. Also tell them if you smoke, drink alcohol, or use illegal drugs. Some items may interact with your medicine. This list may not describe all possible interactions. Give your health care provider a list of all the medicines, herbs, non-prescription drugs, or dietary supplements you use. Also tell them if you smoke, drink alcohol, or use illegal drugs. Some items may interact with your medicine. What should I watch for while using this medicine? You may need blood work done while you are taking this medicine. If you are going to need a MRI, CT scan, or other procedure, tell your doctor that you are using this medicine (On-Body Injector only). What side effects may I notice from receiving this medicine? Side effects that you should report to your doctor or health care professional as soon as possible: -allergic reactions like skin rash, itching or hives, swelling of the face, lips, or tongue -dizziness -fever -pain, redness, or irritation at site where injected -pinpoint red spots on the skin -red or dark-brown urine -shortness of breath or breathing problems -stomach or side pain, or pain   at the shoulder -swelling -tiredness -trouble passing urine or change in the amount of urine Side effects that usually do not require medical attention (report to your doctor or health care professional if they continue or are  bothersome): -bone pain -muscle pain This list may not describe all possible side effects. Call your doctor for medical advice about side effects. You may report side effects to FDA at 1-800-FDA-1088. Where should I keep my medicine? Keep out of the reach of children. Store pre-filled syringes in a refrigerator between 2 and 8 degrees C (36 and 46 degrees F). Do not freeze. Keep in carton to protect from light. Throw away this medicine if it is left out of the refrigerator for more than 48 hours. Throw away any unused medicine after the expiration date. NOTE: This sheet is a summary. It may not cover all possible information. If you have questions about this medicine, talk to your doctor, pharmacist, or health care provider.    2016, [employer]. (2014-05-16 14:30:14)  

## 2015-06-12 NOTE — Progress Notes (Signed)
Patient arrived to [facility] at 1018 and "stopped" his pump in lobby because it was ringing infusion complete.  He arrived to infusion room with pump saying 28ml given but approximately 63ml remaining in 5-FU bag.  Dr. Annamaria Boots gave a verbal order to double the rate of the chemo infusion to 5ml/hr and infuse the remainder of bag and patient agreed to return to clinic in approximately 3-4 hours.  Pump was reset and restarted in infusion room. Patient is ambulatory with no concerns.

## 2015-06-12 NOTE — Progress Notes (Signed)
Neulasta injection given by infusion nurse after home infusion pump disconnected 

## 2015-06-17 ENCOUNTER — Other Ambulatory Visit: Payer: Self-pay | Admitting: Hematology

## 2015-06-17 ENCOUNTER — Inpatient Hospital Stay
Admission: RE | Admit: 2015-06-17 | Discharge: 2015-06-17 | Disposition: A | Discharge status: Home / Self Care | Payer: Self-pay | Source: Ambulatory Visit | Attending: Hematology | Admitting: Hematology

## 2015-06-17 DIAGNOSIS — C169 Malignant neoplasm of stomach, unspecified: Secondary | ICD-10-CM

## 2015-06-24 ENCOUNTER — Other Ambulatory Visit (HOSPITAL_BASED_OUTPATIENT_CLINIC_OR_DEPARTMENT_OTHER): Payer: BLUE CROSS/BLUE SHIELD

## 2015-06-24 ENCOUNTER — Ambulatory Visit (HOSPITAL_BASED_OUTPATIENT_CLINIC_OR_DEPARTMENT_OTHER): Payer: BLUE CROSS/BLUE SHIELD

## 2015-06-24 VITALS — BP 129/61 | HR 64 | Temp 97.9°F

## 2015-06-24 DIAGNOSIS — Z5111 Encounter for antineoplastic chemotherapy: Secondary | ICD-10-CM | POA: Diagnosis not present

## 2015-06-24 DIAGNOSIS — C169 Malignant neoplasm of stomach, unspecified: Secondary | ICD-10-CM

## 2015-06-24 DIAGNOSIS — C786 Secondary malignant neoplasm of retroperitoneum and peritoneum: Secondary | ICD-10-CM

## 2015-06-24 DIAGNOSIS — C801 Malignant (primary) neoplasm, unspecified: Secondary | ICD-10-CM

## 2015-06-24 LAB — CBC WITH DIFFERENTIAL/PLATELET
BASO%: 0.7 % (ref 0.0–2.0)
BASOS ABS: 0 10*3/uL (ref 0.0–0.1)
EOS ABS: 0.1 10*3/uL (ref 0.0–0.5)
EOS%: 2.1 % (ref 0.0–7.0)
HCT: 36.3 % — ABNORMAL LOW (ref 38.4–49.9)
HGB: 11.6 g/dL — ABNORMAL LOW (ref 13.0–17.1)
LYMPH%: 20.2 % (ref 14.0–49.0)
MCH: 28.7 pg (ref 27.2–33.4)
MCHC: 32.1 g/dL (ref 32.0–36.0)
MCV: 89.4 fL (ref 79.3–98.0)
MONO#: 0.9 10*3/uL (ref 0.1–0.9)
MONO%: 13.6 % (ref 0.0–14.0)
NEUT%: 63.4 % (ref 39.0–75.0)
NEUTROS ABS: 4.2 10*3/uL (ref 1.5–6.5)
PLATELETS: 176 10*3/uL (ref 140–400)
RBC: 4.06 10*6/uL — AB (ref 4.20–5.82)
RDW: 17.4 % — ABNORMAL HIGH (ref 11.0–14.6)
WBC: 6.7 10*3/uL (ref 4.0–10.3)
lymph#: 1.3 10*3/uL (ref 0.9–3.3)

## 2015-06-24 LAB — COMPREHENSIVE METABOLIC PANEL
ALT: 18 U/L (ref 0–55)
ANION GAP: 7 meq/L (ref 3–11)
AST: 20 U/L (ref 5–34)
Albumin: 3.1 g/dL — ABNORMAL LOW (ref 3.5–5.0)
Alkaline Phosphatase: 140 U/L (ref 40–150)
BUN: 18.6 mg/dL (ref 7.0–26.0)
CHLORIDE: 108 meq/L (ref 98–109)
CO2: 28 meq/L (ref 22–29)
Calcium: 8.9 mg/dL (ref 8.4–10.4)
Creatinine: 0.9 mg/dL (ref 0.7–1.3)
GLUCOSE: 85 mg/dL (ref 70–140)
POTASSIUM: 4 meq/L (ref 3.5–5.1)
SODIUM: 143 meq/L (ref 136–145)
Total Bilirubin: 0.44 mg/dL (ref 0.20–1.20)
Total Protein: 6.6 g/dL (ref 6.4–8.3)

## 2015-06-24 MED ORDER — SODIUM CHLORIDE 0.9 % IV SOLN
Freq: Once | INTRAVENOUS | Status: AC
  Administered 2015-06-24: 12:00:00 via INTRAVENOUS
  Filled 2015-06-24: qty 8

## 2015-06-24 MED ORDER — LEUCOVORIN CALCIUM INJECTION 350 MG
400.0000 mg/m2 | Freq: Once | INTRAVENOUS | Status: AC
  Administered 2015-06-24: 996 mg via INTRAVENOUS
  Filled 2015-06-24: qty 49.8

## 2015-06-24 MED ORDER — PROCHLORPERAZINE MALEATE 10 MG PO TABS
10.0000 mg | ORAL_TABLET | Freq: Once | ORAL | Status: AC
  Administered 2015-06-24: 10 mg via ORAL

## 2015-06-24 MED ORDER — IRINOTECAN HCL CHEMO INJECTION 100 MG/5ML
180.0000 mg/m2 | Freq: Once | INTRAVENOUS | Status: AC
  Administered 2015-06-24: 448 mg via INTRAVENOUS
  Filled 2015-06-24: qty 7.47

## 2015-06-24 MED ORDER — PROCHLORPERAZINE MALEATE 10 MG PO TABS
ORAL_TABLET | ORAL | Status: AC
  Filled 2015-06-24: qty 1

## 2015-06-24 MED ORDER — SODIUM CHLORIDE 0.9 % IV SOLN
Freq: Once | INTRAVENOUS | Status: AC
  Administered 2015-06-24: 12:00:00 via INTRAVENOUS

## 2015-06-24 MED ORDER — ATROPINE SULFATE 1 MG/ML IJ SOLN
INTRAMUSCULAR | Status: AC
  Filled 2015-06-24: qty 1

## 2015-06-24 MED ORDER — ATROPINE SULFATE 1 MG/ML IJ SOLN
0.5000 mg | Freq: Once | INTRAMUSCULAR | Status: AC | PRN
  Administered 2015-06-24: 0.5 mg via INTRAVENOUS

## 2015-06-24 MED ORDER — FLUOROURACIL CHEMO INJECTION 5 GM/100ML
2200.0000 mg/m2 | INTRAVENOUS | Status: AC
  Administered 2015-06-24: 5500 mg via INTRAVENOUS
  Filled 2015-06-24: qty 110

## 2015-06-24 NOTE — Patient Instructions (Signed)
[place] Discharge Instructions for Patients Receiving Chemotherapy  Today you received the following chemotherapy agents: Irinotecan, leucovorin, 5FU.  To help prevent nausea and vomiting after your treatment, we encourage you to take your nausea medication: Compazine 10 mg every 6 hours as needed; Zofran 4 mg every 8 hours.   If you develop nausea and vomiting that is not controlled by your nausea medication, call the clinic.   BELOW ARE SYMPTOMS THAT SHOULD BE REPORTED IMMEDIATELY:  *FEVER GREATER THAN 100.5 F  *CHILLS WITH OR WITHOUT FEVER  NAUSEA AND VOMITING THAT IS NOT CONTROLLED WITH YOUR NAUSEA MEDICATION  *UNUSUAL SHORTNESS OF BREATH  *UNUSUAL BRUISING OR BLEEDING  TENDERNESS IN MOUTH AND THROAT WITH OR WITHOUT PRESENCE OF ULCERS  *URINARY PROBLEMS  *BOWEL PROBLEMS  UNUSUAL RASH Items with * indicate a potential emergency and should be followed up as soon as possible.  Feel free to call the clinic you have any questions or concerns. The clinic phone number is [phone].  Please show the [place] at check-in to the Emergency Department and triage nurse.

## 2015-06-25 LAB — CEA (PARALLEL TESTING): CEA: 1.7 ng/mL

## 2015-06-25 LAB — CEA: CEA1: 3 ng/mL (ref 0.0–4.7)

## 2015-06-26 ENCOUNTER — Ambulatory Visit (HOSPITAL_BASED_OUTPATIENT_CLINIC_OR_DEPARTMENT_OTHER): Payer: BLUE CROSS/BLUE SHIELD

## 2015-06-26 VITALS — BP 117/65 | HR 48 | Temp 97.5°F

## 2015-06-26 DIAGNOSIS — C169 Malignant neoplasm of stomach, unspecified: Secondary | ICD-10-CM | POA: Diagnosis not present

## 2015-06-26 DIAGNOSIS — Z5189 Encounter for other specified aftercare: Secondary | ICD-10-CM | POA: Diagnosis not present

## 2015-06-26 MED ORDER — PEGFILGRASTIM INJECTION 6 MG/0.6ML ~~LOC~~
6.0000 mg | PREFILLED_SYRINGE | Freq: Once | SUBCUTANEOUS | Status: AC
  Administered 2015-06-26: 6 mg via SUBCUTANEOUS
  Filled 2015-06-26: qty 0.6

## 2015-06-26 MED ORDER — SODIUM CHLORIDE 0.9 % IJ SOLN
10.0000 mL | INTRAMUSCULAR | Status: DC | PRN
  Administered 2015-06-26: 10 mL
  Filled 2015-06-26: qty 10

## 2015-06-26 MED ORDER — HEPARIN SOD (PORK) LOCK FLUSH 100 UNIT/ML IV SOLN
500.0000 [IU] | Freq: Once | INTRAVENOUS | Status: AC | PRN
  Administered 2015-06-26: 500 [IU]
  Filled 2015-06-26: qty 5

## 2015-07-06 ENCOUNTER — Other Ambulatory Visit: Payer: Self-pay | Admitting: Hematology

## 2015-07-08 ENCOUNTER — Ambulatory Visit (HOSPITAL_BASED_OUTPATIENT_CLINIC_OR_DEPARTMENT_OTHER): Payer: BLUE CROSS/BLUE SHIELD

## 2015-07-08 ENCOUNTER — Other Ambulatory Visit (HOSPITAL_BASED_OUTPATIENT_CLINIC_OR_DEPARTMENT_OTHER): Payer: BLUE CROSS/BLUE SHIELD

## 2015-07-08 VITALS — BP 120/51 | HR 62 | Temp 97.7°F

## 2015-07-08 DIAGNOSIS — Z5111 Encounter for antineoplastic chemotherapy: Secondary | ICD-10-CM

## 2015-07-08 DIAGNOSIS — C801 Malignant (primary) neoplasm, unspecified: Secondary | ICD-10-CM

## 2015-07-08 DIAGNOSIS — C786 Secondary malignant neoplasm of retroperitoneum and peritoneum: Secondary | ICD-10-CM

## 2015-07-08 DIAGNOSIS — C7800 Secondary malignant neoplasm of unspecified lung: Secondary | ICD-10-CM

## 2015-07-08 DIAGNOSIS — C169 Malignant neoplasm of stomach, unspecified: Secondary | ICD-10-CM

## 2015-07-08 LAB — COMPREHENSIVE METABOLIC PANEL
ALT: 19 U/L (ref 0–55)
ANION GAP: 7 meq/L (ref 3–11)
AST: 19 U/L (ref 5–34)
Albumin: 3.2 g/dL — ABNORMAL LOW (ref 3.5–5.0)
Alkaline Phosphatase: 153 U/L — ABNORMAL HIGH (ref 40–150)
BUN: 16.4 mg/dL (ref 7.0–26.0)
CALCIUM: 8.8 mg/dL (ref 8.4–10.4)
CHLORIDE: 107 meq/L (ref 98–109)
CO2: 27 meq/L (ref 22–29)
CREATININE: 0.9 mg/dL (ref 0.7–1.3)
Glucose: 99 mg/dl (ref 70–140)
POTASSIUM: 4.2 meq/L (ref 3.5–5.1)
Sodium: 141 mEq/L (ref 136–145)
Total Bilirubin: 0.48 mg/dL (ref 0.20–1.20)
Total Protein: 6.7 g/dL (ref 6.4–8.3)

## 2015-07-08 LAB — CBC WITH DIFFERENTIAL/PLATELET
BASO%: 0.4 % (ref 0.0–2.0)
BASOS ABS: 0 10*3/uL (ref 0.0–0.1)
EOS ABS: 0.1 10*3/uL (ref 0.0–0.5)
EOS%: 2.1 % (ref 0.0–7.0)
HEMATOCRIT: 34.8 % — AB (ref 38.4–49.9)
HGB: 11.3 g/dL — ABNORMAL LOW (ref 13.0–17.1)
LYMPH#: 1.4 10*3/uL (ref 0.9–3.3)
LYMPH%: 27.2 % (ref 14.0–49.0)
MCH: 28.9 pg (ref 27.2–33.4)
MCHC: 32.4 g/dL (ref 32.0–36.0)
MCV: 89.2 fL (ref 79.3–98.0)
MONO#: 0.7 10*3/uL (ref 0.1–0.9)
MONO%: 13.9 % (ref 0.0–14.0)
NEUT#: 2.8 10*3/uL (ref 1.5–6.5)
NEUT%: 56.4 % (ref 39.0–75.0)
PLATELETS: 196 10*3/uL (ref 140–400)
RBC: 3.9 10*6/uL — AB (ref 4.20–5.82)
RDW: 17.4 % — ABNORMAL HIGH (ref 11.0–14.6)
WBC: 5 10*3/uL (ref 4.0–10.3)

## 2015-07-08 MED ORDER — DEXTROSE 5 % IV SOLN
180.0000 mg/m2 | Freq: Once | INTRAVENOUS | Status: AC
  Administered 2015-07-08: 448 mg via INTRAVENOUS
  Filled 2015-07-08: qty 5

## 2015-07-08 MED ORDER — PROCHLORPERAZINE MALEATE 10 MG PO TABS
ORAL_TABLET | ORAL | Status: AC
  Filled 2015-07-08: qty 1

## 2015-07-08 MED ORDER — PROCHLORPERAZINE MALEATE 10 MG PO TABS
10.0000 mg | ORAL_TABLET | Freq: Once | ORAL | Status: AC
  Administered 2015-07-08: 10 mg via ORAL

## 2015-07-08 MED ORDER — ATROPINE SULFATE 1 MG/ML IJ SOLN
0.5000 mg | Freq: Once | INTRAMUSCULAR | Status: AC | PRN
  Administered 2015-07-08: 0.5 mg via INTRAVENOUS

## 2015-07-08 MED ORDER — SODIUM CHLORIDE 0.9 % IV SOLN
2200.0000 mg/m2 | INTRAVENOUS | Status: DC
  Administered 2015-07-08: 5500 mg via INTRAVENOUS
  Filled 2015-07-08: qty 110

## 2015-07-08 MED ORDER — ATROPINE SULFATE 1 MG/ML IJ SOLN
INTRAMUSCULAR | Status: AC
  Filled 2015-07-08: qty 1

## 2015-07-08 MED ORDER — SODIUM CHLORIDE 0.9 % IV SOLN
Freq: Once | INTRAVENOUS | Status: AC
  Administered 2015-07-08: 13:00:00 via INTRAVENOUS
  Filled 2015-07-08: qty 8

## 2015-07-08 MED ORDER — SODIUM CHLORIDE 0.9 % IV SOLN
Freq: Once | INTRAVENOUS | Status: AC
  Administered 2015-07-08: 12:00:00 via INTRAVENOUS

## 2015-07-08 MED ORDER — LEUCOVORIN CALCIUM INJECTION 350 MG
400.0000 mg/m2 | Freq: Once | INTRAVENOUS | Status: AC
  Administered 2015-07-08: 996 mg via INTRAVENOUS
  Filled 2015-07-08: qty 49.8

## 2015-07-08 NOTE — Patient Instructions (Signed)
[place] Discharge Instructions for Patients Receiving Chemotherapy  Today you received the following chemotherapy agents Camptosar, Leucovorin and Adrucil.  To help prevent nausea and vomiting after your treatment, we encourage you to take your nausea medication as prescribed.   If you develop nausea and vomiting that is not controlled by your nausea medication, call the clinic.   BELOW ARE SYMPTOMS THAT SHOULD BE REPORTED IMMEDIATELY:  *FEVER GREATER THAN 100.5 F  *CHILLS WITH OR WITHOUT FEVER  NAUSEA AND VOMITING THAT IS NOT CONTROLLED WITH YOUR NAUSEA MEDICATION  *UNUSUAL SHORTNESS OF BREATH  *UNUSUAL BRUISING OR BLEEDING  TENDERNESS IN MOUTH AND THROAT WITH OR WITHOUT PRESENCE OF ULCERS  *URINARY PROBLEMS  *BOWEL PROBLEMS  UNUSUAL RASH Items with * indicate a potential emergency and should be followed up as soon as possible.  Feel free to call the clinic you have any questions or concerns. The clinic phone number is [phone].  Please show the [place] at check-in to the Emergency Department and triage nurse.

## 2015-07-10 ENCOUNTER — Ambulatory Visit: Payer: BLUE CROSS/BLUE SHIELD

## 2015-07-10 ENCOUNTER — Ambulatory Visit (HOSPITAL_BASED_OUTPATIENT_CLINIC_OR_DEPARTMENT_OTHER): Payer: BLUE CROSS/BLUE SHIELD

## 2015-07-10 VITALS — BP 112/60 | HR 54 | Temp 97.6°F | Resp 20

## 2015-07-10 DIAGNOSIS — Z5189 Encounter for other specified aftercare: Secondary | ICD-10-CM | POA: Diagnosis not present

## 2015-07-10 DIAGNOSIS — C7800 Secondary malignant neoplasm of unspecified lung: Secondary | ICD-10-CM | POA: Diagnosis not present

## 2015-07-10 DIAGNOSIS — C169 Malignant neoplasm of stomach, unspecified: Secondary | ICD-10-CM

## 2015-07-10 DIAGNOSIS — C786 Secondary malignant neoplasm of retroperitoneum and peritoneum: Secondary | ICD-10-CM | POA: Diagnosis not present

## 2015-07-10 MED ORDER — SODIUM CHLORIDE 0.9 % IJ SOLN
10.0000 mL | INTRAMUSCULAR | Status: DC | PRN
Start: 2015-07-10 — End: 2015-07-10
  Administered 2015-07-10: 10 mL
  Filled 2015-07-10: qty 10

## 2015-07-10 MED ORDER — PEGFILGRASTIM INJECTION 6 MG/0.6ML ~~LOC~~
6.0000 mg | PREFILLED_SYRINGE | Freq: Once | SUBCUTANEOUS | Status: AC
  Administered 2015-07-10: 6 mg via SUBCUTANEOUS
  Filled 2015-07-10: qty 0.6

## 2015-07-10 MED ORDER — HEPARIN SOD (PORK) LOCK FLUSH 100 UNIT/ML IV SOLN
500.0000 [IU] | Freq: Once | INTRAVENOUS | Status: AC | PRN
  Administered 2015-07-10: 500 [IU]
  Filled 2015-07-10: qty 5

## 2015-07-10 NOTE — Patient Instructions (Signed)
Pegfilgrastim injection What is this medicine? PEGFILGRASTIM (PEG fil gra stim) is a long-acting granulocyte colony-stimulating factor that stimulates the growth of neutrophils, a type of white blood cell important in the body's fight against infection. It is used to reduce the incidence of fever and infection in patients with certain types of cancer who are receiving chemotherapy that affects the bone marrow, and to increase survival after being exposed to high doses of radiation. This medicine may be used for other purposes; ask your health care provider or pharmacist if you have questions. What should I tell my health care provider before I take this medicine? They need to know if you have any of these conditions: -kidney disease -latex allergy -ongoing radiation therapy -sickle cell disease -skin reactions to acrylic adhesives (On-Body Injector only) -an unusual or allergic reaction to pegfilgrastim, filgrastim, other medicines, foods, dyes, or preservatives -pregnant or trying to get pregnant -breast-feeding How should I use this medicine? This medicine is for injection under the skin. If you get this medicine at home, you will be taught how to prepare and give the pre-filled syringe or how to use the On-body Injector. Refer to the patient Instructions for Use for detailed instructions. Use exactly as directed. Take your medicine at regular intervals. Do not take your medicine more often than directed. It is important that you put your used needles and syringes in a special sharps container. Do not put them in a trash can. If you do not have a sharps container, call your pharmacist or healthcare provider to get one. Talk to your pediatrician regarding the use of this medicine in children. While this drug may be prescribed for selected conditions, precautions do apply. Overdosage: If you think you have taken too much of this medicine contact a poison control center or emergency room at  once. NOTE: This medicine is only for you. Do not share this medicine with others. What if I miss a dose? It is important not to miss your dose. Call your doctor or health care professional if you miss your dose. If you miss a dose due to an On-body Injector failure or leakage, a new dose should be administered as soon as possible using a single prefilled syringe for manual use. What may interact with this medicine? Interactions have not been studied. Give your health care provider a list of all the medicines, herbs, non-prescription drugs, or dietary supplements you use. Also tell them if you smoke, drink alcohol, or use illegal drugs. Some items may interact with your medicine. This list may not describe all possible interactions. Give your health care provider a list of all the medicines, herbs, non-prescription drugs, or dietary supplements you use. Also tell them if you smoke, drink alcohol, or use illegal drugs. Some items may interact with your medicine. What should I watch for while using this medicine? You may need blood work done while you are taking this medicine. If you are going to need a MRI, CT scan, or other procedure, tell your doctor that you are using this medicine (On-Body Injector only). What side effects may I notice from receiving this medicine? Side effects that you should report to your doctor or health care professional as soon as possible: -allergic reactions like skin rash, itching or hives, swelling of the face, lips, or tongue -dizziness -fever -pain, redness, or irritation at site where injected -pinpoint red spots on the skin -red or dark-brown urine -shortness of breath or breathing problems -stomach or side pain, or pain   at the shoulder -swelling -tiredness -trouble passing urine or change in the amount of urine Side effects that usually do not require medical attention (report to your doctor or health care professional if they continue or are  bothersome): -bone pain -muscle pain This list may not describe all possible side effects. Call your doctor for medical advice about side effects. You may report side effects to FDA at 1-800-FDA-1088. Where should I keep my medicine? Keep out of the reach of children. Store pre-filled syringes in a refrigerator between 2 and 8 degrees C (36 and 46 degrees F). Do not freeze. Keep in carton to protect from light. Throw away this medicine if it is left out of the refrigerator for more than 48 hours. Throw away any unused medicine after the expiration date. NOTE: This sheet is a summary. It may not cover all possible information. If you have questions about this medicine, talk to your doctor, pharmacist, or health care provider.    2016, [employer]. (2014-05-16 14:30:14)  

## 2015-07-10 NOTE — Progress Notes (Signed)
Neulasta injection given by infusion nurse 

## 2015-07-22 ENCOUNTER — Ambulatory Visit (HOSPITAL_BASED_OUTPATIENT_CLINIC_OR_DEPARTMENT_OTHER): Payer: BLUE CROSS/BLUE SHIELD | Admitting: Hematology

## 2015-07-22 ENCOUNTER — Other Ambulatory Visit (HOSPITAL_BASED_OUTPATIENT_CLINIC_OR_DEPARTMENT_OTHER): Payer: BLUE CROSS/BLUE SHIELD

## 2015-07-22 ENCOUNTER — Encounter: Payer: Self-pay | Admitting: Hematology

## 2015-07-22 ENCOUNTER — Ambulatory Visit (HOSPITAL_BASED_OUTPATIENT_CLINIC_OR_DEPARTMENT_OTHER): Payer: BLUE CROSS/BLUE SHIELD

## 2015-07-22 VITALS — BP 114/57 | HR 75 | Temp 97.8°F | Resp 18 | Ht 66.0 in | Wt 289.5 lb

## 2015-07-22 DIAGNOSIS — C786 Secondary malignant neoplasm of retroperitoneum and peritoneum: Secondary | ICD-10-CM

## 2015-07-22 DIAGNOSIS — Z5111 Encounter for antineoplastic chemotherapy: Secondary | ICD-10-CM | POA: Diagnosis not present

## 2015-07-22 DIAGNOSIS — D630 Anemia in neoplastic disease: Secondary | ICD-10-CM

## 2015-07-22 DIAGNOSIS — C7802 Secondary malignant neoplasm of left lung: Secondary | ICD-10-CM | POA: Diagnosis not present

## 2015-07-22 DIAGNOSIS — C169 Malignant neoplasm of stomach, unspecified: Secondary | ICD-10-CM | POA: Diagnosis not present

## 2015-07-22 DIAGNOSIS — D6481 Anemia due to antineoplastic chemotherapy: Secondary | ICD-10-CM

## 2015-07-22 DIAGNOSIS — C7801 Secondary malignant neoplasm of right lung: Secondary | ICD-10-CM

## 2015-07-22 DIAGNOSIS — R180 Malignant ascites: Secondary | ICD-10-CM

## 2015-07-22 DIAGNOSIS — C801 Malignant (primary) neoplasm, unspecified: Secondary | ICD-10-CM

## 2015-07-22 DIAGNOSIS — D509 Iron deficiency anemia, unspecified: Secondary | ICD-10-CM

## 2015-07-22 LAB — CBC WITH DIFFERENTIAL/PLATELET
BASO%: 0.7 % (ref 0.0–2.0)
BASOS ABS: 0 10*3/uL (ref 0.0–0.1)
EOS ABS: 0.1 10*3/uL (ref 0.0–0.5)
EOS%: 1.8 % (ref 0.0–7.0)
HCT: 36.8 % — ABNORMAL LOW (ref 38.4–49.9)
HGB: 11.6 g/dL — ABNORMAL LOW (ref 13.0–17.1)
LYMPH%: 20.9 % (ref 14.0–49.0)
MCH: 28.4 pg (ref 27.2–33.4)
MCHC: 31.5 g/dL — AB (ref 32.0–36.0)
MCV: 90.3 fL (ref 79.3–98.0)
MONO#: 0.7 10*3/uL (ref 0.1–0.9)
MONO%: 13.1 % (ref 0.0–14.0)
NEUT#: 3.4 10*3/uL (ref 1.5–6.5)
NEUT%: 63.5 % (ref 39.0–75.0)
Platelets: 177 10*3/uL (ref 140–400)
RBC: 4.08 10*6/uL — AB (ref 4.20–5.82)
RDW: 17.4 % — ABNORMAL HIGH (ref 11.0–14.6)
WBC: 5.4 10*3/uL (ref 4.0–10.3)
lymph#: 1.1 10*3/uL (ref 0.9–3.3)

## 2015-07-22 LAB — COMPREHENSIVE METABOLIC PANEL
ALBUMIN: 3.3 g/dL — AB (ref 3.5–5.0)
ALK PHOS: 161 U/L — AB (ref 40–150)
ALT: 19 U/L (ref 0–55)
AST: 20 U/L (ref 5–34)
Anion Gap: 6 mEq/L (ref 3–11)
BILIRUBIN TOTAL: 0.32 mg/dL (ref 0.20–1.20)
BUN: 20.9 mg/dL (ref 7.0–26.0)
CO2: 29 meq/L (ref 22–29)
CREATININE: 0.9 mg/dL (ref 0.7–1.3)
Calcium: 9.1 mg/dL (ref 8.4–10.4)
Chloride: 109 mEq/L (ref 98–109)
GLUCOSE: 59 mg/dL — AB (ref 70–140)
POTASSIUM: 3.9 meq/L (ref 3.5–5.1)
SODIUM: 144 meq/L (ref 136–145)
Total Protein: 6.8 g/dL (ref 6.4–8.3)

## 2015-07-22 MED ORDER — LEUCOVORIN CALCIUM INJECTION 350 MG
400.0000 mg/m2 | Freq: Once | INTRAVENOUS | Status: AC
  Administered 2015-07-22: 996 mg via INTRAVENOUS
  Filled 2015-07-22: qty 49.8

## 2015-07-22 MED ORDER — HEPARIN SOD (PORK) LOCK FLUSH 100 UNIT/ML IV SOLN
500.0000 [IU] | Freq: Once | INTRAVENOUS | Status: DC | PRN
  Filled 2015-07-22: qty 5

## 2015-07-22 MED ORDER — SODIUM CHLORIDE 0.9 % IV SOLN
Freq: Once | INTRAVENOUS | Status: AC
  Administered 2015-07-22: 12:00:00 via INTRAVENOUS

## 2015-07-22 MED ORDER — SODIUM CHLORIDE 0.9 % IJ SOLN
10.0000 mL | INTRAMUSCULAR | Status: DC | PRN
  Filled 2015-07-22: qty 10

## 2015-07-22 MED ORDER — IRINOTECAN HCL CHEMO INJECTION 100 MG/5ML
180.0000 mg/m2 | Freq: Once | INTRAVENOUS | Status: AC
  Administered 2015-07-22: 448 mg via INTRAVENOUS
  Filled 2015-07-22: qty 7.47

## 2015-07-22 MED ORDER — ATROPINE SULFATE 1 MG/ML IJ SOLN: 0.5000 mg | Freq: Once | INTRAMUSCULAR | Status: DC | PRN

## 2015-07-22 MED ORDER — DEXAMETHASONE SODIUM PHOSPHATE 100 MG/10ML IJ SOLN
Freq: Once | INTRAMUSCULAR | Status: AC
  Administered 2015-07-22: 12:00:00 via INTRAVENOUS
  Filled 2015-07-22: qty 8

## 2015-07-22 MED ORDER — SODIUM CHLORIDE 0.9 % IV SOLN
2200.0000 mg/m2 | INTRAVENOUS | Status: DC
  Administered 2015-07-22: 5500 mg via INTRAVENOUS
  Filled 2015-07-22: qty 110

## 2015-07-22 MED ORDER — PROCHLORPERAZINE MALEATE 10 MG PO TABS
ORAL_TABLET | ORAL | Status: AC
  Filled 2015-07-22: qty 1

## 2015-07-22 MED ORDER — PROCHLORPERAZINE MALEATE 10 MG PO TABS
10.0000 mg | ORAL_TABLET | Freq: Once | ORAL | Status: AC
  Administered 2015-07-22: 10 mg via ORAL

## 2015-07-22 NOTE — Patient Instructions (Signed)
[place] Discharge Instructions for Patients Receiving Chemotherapy  Today you received the following chemotherapy agents:  Fluorouracil, Irinotecan, Leucovorin  To help prevent nausea and vomiting after your treatment, we encourage you to take your nausea medication as prescribed.   If you develop nausea and vomiting that is not controlled by your nausea medication, call the clinic.   BELOW ARE SYMPTOMS THAT SHOULD BE REPORTED IMMEDIATELY:  *FEVER GREATER THAN 100.5 F  *CHILLS WITH OR WITHOUT FEVER  NAUSEA AND VOMITING THAT IS NOT CONTROLLED WITH YOUR NAUSEA MEDICATION  *UNUSUAL SHORTNESS OF BREATH  *UNUSUAL BRUISING OR BLEEDING  TENDERNESS IN MOUTH AND THROAT WITH OR WITHOUT PRESENCE OF ULCERS  *URINARY PROBLEMS  *BOWEL PROBLEMS  UNUSUAL RASH Items with * indicate a potential emergency and should be followed up as soon as possible.  Feel free to call the clinic you have any questions or concerns. The clinic phone number is [phone].  Please show the [place] at check-in to the Emergency Department and triage nurse.

## 2015-07-22 NOTE — Progress Notes (Signed)
Samuel Durham  [phone] Fax:([phone]) [phone]  Clinic follow-up Note   Patient Care Team: No Pcp Per Patient as PCP - General (General Practice) 07/22/2015  CHIEF COMPLAINTS Follow up metastatic gastric cancer    Gastric adenocarcinoma: Stage IV   04/23/2014 Imaging CT abdomen showed peritoneal carcinomatosis, ascites, thickning of gastric and GEJ wall, CT chest showed a few small b/l lung nodules.    04/24/2014 Tumor Marker CEA  5.6, CA19.9 13   04/25/2014 Initial Diagnosis Gastric adenocarcinoma: Stage IV   04/25/2014 Pathology Results Stomach mass biopsy showed - ADENOCARCINOMA WITH SIGNET RING FEATURES. ascites cytology (+)   04/25/2014 Procedure EGD: There was a large, nearly circumferential, ulcerated, clearly malignant mass that stradles the GE junction. The vast bulk of the mass lays in the stomach.    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 I saw him first when he was recently admitted to [facility]. He is here for the first office visit after hospital discharge.  He was  admitted on 12/15 with progressive, 4 month history of dysphagia, epigastric abdominal discomfort and bloating, early satiation accompanied by nausea, and postprandial vomiting and diarrhea. He has experienced a 30 lbs weight loss in the process. He has noted increased abdominal girth. Patient denies any shortness of breath, chest pain, GI bleed, hematuria or lower extremity swelling. He denies heavy alcohol intake. Denies tobacco abuse. Denies risk factors  for HIV or hepatitis. He denies any family history of GI malignancies.  CT of the abdomen and pelvis with contrast on 12/15 revealed several pulmonary nodules at the bases bilaterally, profound thickening of the distal esophagus extending beyond the gastroesophageal junction into the proximal stomach,cardia and fundus of the stomach, most evident along the lesser curvature. No pathologic dilatation of small bowel or colon. Extensive upper abdominal and retroperitoneal lymphadenopathy was visualized. Moderate volume of ascites, presumably malignant, was noted. Extensive soft tissue nodularity throughout the omentum, compatible with omental caking and widespread peritoneal metastasis. Additionally, there are other areas of enhancing nodularity along the peritoneal surface. Tiny ventral and umbilical hernias containing predominantly omental fat, including some omental implants. There are no aggressive appearing lytic or blastic lesions noted in the visualized portions of the skeleton.  He underwent an EGD by Dr. Ardis Hughs on 04/25/2014, which showed a large fungating mass that straddles the GE junction and occupying the upper 1/3 part of the stomach. Multiple biopsy was taken which showed adenocarcinoma with signet ring features. He also underwent a paracentesis with 4.5 L fluid is removed on 04/26/2014. Cytology was positive for adenocarcinoma with signet ring features, consistent with metastatic disease.  He was discharged to home on 04/27/2014. He did feel a slightly informant of his abdominal bloating after the paracentesis, but not much improvement other symptoms. He had Mediport placement and the second paracentesis was again 4 L fluids removal on 05/02/2014. His appetite is low, he drinks ensure 3 bottles a day, some soup and a very little other foods. His energy level is also low, but able to take care of all his daily needs, and  move around with about much difficulty. His abdominal discomfort/pain are  relatively controlled by morphine 15 mg at night and tramadol 2-3 times during the day. He denies any fever, chills, cough, chest pain or any in the other discomfort.  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  INTERIM HISTORY: Samuel Durham returns for follow up. He is tolerating chemo very well, no significant side effects. He is doing well overall, has good appetite and eating well, moderate fatigue is stable, his tolerating routine activity without any difficulties. He is scheduled to follow up at [facility] next week.  MEDICAL HISTORY:  Past Medical History  Diagnosis Date  . Medical history non-contributory   . gastric ca dx'd 04/2014    SURGICAL HISTORY: Past Surgical History  Procedure Laterality Date  . Squamous cell carcinoma excision  approx March 2015    removed from top of head  . Esophagogastroduodenoscopy (egd) with propofol N/A 04/25/2014    Procedure: ESOPHAGOGASTRODUODENOSCOPY (EGD) WITH PROPOFOL;  Surgeon: Milus Banister, MD;  Location: [facility];  Service: Endoscopy;  Laterality: N/A;    SOCIAL HISTORY: Social History   Social History  . Marital Status: Single    Spouse Name: N/A  . Number of Children: N/A  . Years of Education: N/A   Occupational History  . Not on file.   Social History Main Topics  . Smoking status: Never Smoker   . Smokeless tobacco: Never Used  . Alcohol Use: No  . Drug Use: No  . Sexual Activity: Not on file   Other Topics Concern  . Not on file   Social History Narrative   Single, lives alone with pet dog (rescue dog)   Works as Information systems manager; drives; independent   Middle child between #2 sisters-his mother and sisters are very supportive    FAMILY HISTORY: Family History  Problem Relation Age of Onset  . Breast cancer Sister 21   . Hypertension Mother   . Hypertension Father   Maternal aunt breast  cancer at age of 22 Maternal cousin ovarian cancer at age of 13 Maternal ancle prostate cancer age of 49   ALLERGIES:  has No Known Allergies.  MEDICATIONS:  Current Outpatient Prescriptions  Medication Sig Dispense Refill  . docusate sodium 100 MG CAPS Take 100 mg by mouth 2 (two) times daily. 10 capsule 0  . lidocaine-prilocaine (EMLA) cream Apply 1 application topically as needed. 30 g 2  . mirtazapine (REMERON) 15 MG tablet Take 1 tablet (15 mg total) by mouth at bedtime. 30 tablet 1  . ondansetron (ZOFRAN) 4 MG tablet Take 1 tablet (4 mg total) by mouth every 8 (eight) hours as needed for nausea or vomiting. 20 tablet 0  . pantoprazole (PROTONIX) 40 MG tablet Take 1 tablet (40 mg total) by mouth daily. 30 tablet 0  . morphine (MS CONTIN) 15 MG 12 hr tablet Take 1 tablet (15 mg total) by mouth every 12 (twelve) hours. (Patient not taking: Reported on 05/26/2015) 60 tablet 0  . prochlorperazine (COMPAZINE) 10 MG tablet Take 1 tablet (10 mg total) by mouth every 6 (six) hours as needed for nausea or vomiting. (Patient not taking: Reported on 07/22/2015) 30 tablet 3  . traMADol (ULTRAM) 50 MG tablet Take 1-2 tablets (50-100 mg total) by mouth every 6 (six) hours as needed for moderate pain. (Patient not taking: Reported on 07/22/2015) 30 tablet 0  No current facility-administered medications for this visit.   Facility-Administered Medications Ordered in Other Visits  Medication Dose Route Frequency Provider Last Rate Last Dose  . sodium chloride 0.9 % injection 10 mL  10 mL Intravenous PRN Truitt Merle, MD        REVIEW OF SYSTEMS:   Constitutional: Denies fevers, chills or abnormal night sweats, no recent weight loss Eyes: Denies blurriness of vision, double vision or watery eyes Ears, nose, mouth, throat, and face: Denies mucositis or sore throat Respiratory: Denies cough, dyspnea or wheezes Cardiovascular: Denies palpitation, chest discomfort or lower extremity swelling Gastrointestinal:   Less nausea, abdominal bloating and discomfort Skin: Denies abnormal skin rashes Lymphatics: Denies new lymphadenopathy or easy bruising Neurological:Denies numbness, tingling or new weaknesses Behavioral/Psych: Mood is stable, no new changes  All other systems were reviewed with the patient and are negative except those mentioned in the history.  PHYSICAL EXAMINATION: ECOG PERFORMANCE STATUS: 1 BP 114/57 mmHg  Pulse 75  Temp(Src) 97.8 F (36.6 C) (Oral)  Resp 18  Ht 5' 6"  (1.676 m)  Wt 289 lb 8 oz (131.316 kg)  BMI 46.75 kg/m2  SpO2 100%  GENERAL:alert, no distress and comfortable SKIN: skin color, texture, turgor are normal, no rashes or significant lesions EYES: normal, conjunctiva are pink and non-injected, sclera clear OROPHARYNX:no exudate, no erythema and lips, buccal mucosa, and tongue normal  NECK: supple, thyroid normal size, non-tender, without nodularity LYMPH:  no palpable lymphadenopathy in the cervical, axillary or inguinal LUNGS: clear to auscultation and percussion with normal breathing effort HEART: regular rate & rhythm and no murmurs and no lower extremity edema ABDOMEN:abdomen soft, non-tender and normal bowel sounds, (+) small and is 1 which is well. amount of ascites. Musculoskeletal:no cyanosis of digits and no clubbing  PSYCH: alert & oriented x 3 with fluent speech NEURO: no focal motor/sensory deficits  LABORATORY DATA:  I have reviewed the data as listed CBC Latest Ref Rng 07/22/2015 07/08/2015 06/24/2015  WBC 4.0 - 10.3 10e3/uL 5.4 5.0 6.7  Hemoglobin 13.0 - 17.1 g/dL 11.6(L) 11.3(L) 11.6(L)  Hematocrit 38.4 - 49.9 % 36.8(L) 34.8(L) 36.3(L)  Platelets 140 - 400 10e3/uL 177 196 176      CMP Latest Ref Rng 07/22/2015 07/08/2015 06/24/2015  Glucose 70 - 140 mg/dl 59(L) 99 85  BUN 7.0 - 26.0 mg/dL 20.9 16.4 18.6  Creatinine 0.7 - 1.3 mg/dL 0.9 0.9 0.9  Sodium 136 - 145 mEq/L 144 141 143  Potassium 3.5 - 5.1 mEq/L 3.9 4.2 4.0  CO2 22 - 29 mEq/L 29 27  28   Calcium 8.4 - 10.4 mg/dL 9.1 8.8 8.9  Total Protein 6.4 - 8.3 g/dL 6.8 6.7 6.6  Total Bilirubin 0.20 - 1.20 mg/dL 0.32 0.48 0.44  Alkaline Phos 40 - 150 U/L 161(H) 153(H) 140  AST 5 - 34 U/L 20 19 20   ALT 0 - 55 U/L 19 19 18    CEA  Status: Finalresult Visible to patient:  MyChart Nextappt: 05/27/2015 at 09:00 AM in [facility] I26 DNS) Dx:  Gastric adenocarcinoma: Stage IV           Ref Range 12d ago  38moago  258mogo     CEA 0.0 - 5.0 ng/mL 2.9 3.7 5.3 (H)          Pathology report  Diagnosis  06/07/2014 PERITONEAL/ASCITIC FLUID(SPECIMEN 1 OF 1 COLLECTED 04/26/14): METASTATIC ADENOCARCINOMA WITH SIGNET RING CELL FEATURES.  Stomach, biopsy, r/o cancer 04/25/14 - ADENOCARCINOMA WITH SIGNET RING FEATURES. Microscopic Comment  The results were called to Dr. Ardis Hughs on 04/26/2014. (JDP:kh 04/26/14) Claudette Laws MD Pathologist, Electronic Signature (Case signed 04/26/2014)  HER-2/NEU BY CISH - NO AMPLIFICATION OF HER-2 DETECTED. RESULT RATIO OF HER2: CEP 17 SIGNALS 1.20 AVERAGE HER2 COPY NUMBER PER CELL 2.10  ADDITIONAL INFORMATION: Mismatch Repair (MMR) Protein Immunohistochemistry (IHC) IHC Expression Result: MLH1: Preserved nuclear expression (greater 50% tumor expression) MSH2: Preserved nuclear expression (greater 50% tumor expression) MSH6: Preserved nuclear expression (greater 50% tumor expression) PMS2: Preserved nuclear expression (greater 50% tumor expression) * Internal control demonstrates intact nuclear expression Interpretation: NORMAL There is preserved expression of the major and minor MMR proteins. There is a very low probability that microsatellite instability (MSI) is present. However, certain clinically significant MMR protein mutations may result in preservation of nuclear expression. It is recommended that the preservation of protein expression be correlated with molecular based MSI testing.  [facility] testing: (+) CB76 and NF1   RADIOGRAPHIC STUDIES: I have personally reviewed the radiological images as listed and agreed with the findings in the report  CT chest, abdomen and pelvis with contrast, 06/06/2015 at [facility] IMPRESSION: 1. Stable bilateral pulmonary nodules, possible metastases.  2. Stable calcified upper abdominal lymphadenopathy and calcified gastric tumor.  3. Decreased volume of ascites.  4. Extensive omental and peritoneal carcinomatosis, probably stable.  I ASSESSMENT & PLAN:  40 year old gentleman without significant past medical history presents with abdominal bloating, nausea, vomiting, diarrhea, anemia and weight loss. EGD showed a large mass arising from the gastric fundus and extending into GE junction, biopsy showed adenocarcinoma with signet ring features, CT scan is consistent with peritoneal carcinomatosis and bilateral lung metastasis. Ascites cytology was positive for malignant cells.  1. 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 HER-2 negative, MMR-normal -I discussed his surgical biopsy results, cytology results, scan findings with patient and his family members, including his parents and 2 sisters. Unfortunately, this is widely metastatic, not curable, and overall prognosis is very poor.  -His tumor is negative for HER-2 overexpression, no role for trastuzumab. -he had excellent response to first-line chemotherapy FOLFOX, currently on second line FOLFIRI due to disease progression. -I reviewed his recently restaging CT which was done at [facility], it showed stable disease.  -His CEA is coming down again, clinically doing better,  recurrent ascites has most resolved, which is consistent with her good response to chemo. -surgical debulking and HIPEC was discussed by his oncologist Dr. Liane Comber and surgeon at [facility] 2 month ago. Pt is scheduled to return next week for a restaging CT  scan, possible lung lesion biopsy, then decide about debulking and HIPEC surgery at [facility]  -continue chemo for now   2. Recurrent malignant ascites -s/p paracentesis at the beginning of his diagnosis, and again on 03/10/15 and 03/24/15, and 11/23, 12/6 and 1/6.  His ascites has near resolved again. -We'll  Continue monitoring  3. Anemia, multifactorial (iron deficient anemia, cancer related and chemotherapy induced ) -His ferritin level was normal at 171, but he has low iron and saturation, likely some degree of iron deficient anemia. -He has received IV Feraheme 510 mg twice, 2 weeks apart -Blood transfusion as needed during chemotherapy if her hemoglobin below 8. -Mild anemia has been stable.   Plan -continue chemotherapy FOLFIRI today and every 2 weeks with Neulasta on day 3 - he will follow-up at [facility] with a restaging CTs and possible lung lesion biopsy next week, to determine  if he is a candidate for debulking and HIPEC surgery  - I'll see him back in 2 weeks  The patient knows to call the clinic with any problems, questions or concerns.  I spent 20 minutes counseling the patient face to face. The total time spent in the appointment was 25  minutes and more than 50% was on counseling    Truitt Merle, MD 07/22/2015

## 2015-07-23 LAB — CEA (PARALLEL TESTING): CEA: 2 ng/mL

## 2015-07-23 LAB — CEA: CEA1: 3.8 ng/mL (ref 0.0–4.7)

## 2015-07-24 ENCOUNTER — Ambulatory Visit: Payer: BLUE CROSS/BLUE SHIELD

## 2015-07-24 ENCOUNTER — Ambulatory Visit (HOSPITAL_BASED_OUTPATIENT_CLINIC_OR_DEPARTMENT_OTHER): Payer: BLUE CROSS/BLUE SHIELD

## 2015-07-24 VITALS — BP 112/58 | HR 58 | Temp 97.8°F | Resp 18

## 2015-07-24 DIAGNOSIS — Z5189 Encounter for other specified aftercare: Secondary | ICD-10-CM | POA: Diagnosis not present

## 2015-07-24 DIAGNOSIS — C169 Malignant neoplasm of stomach, unspecified: Secondary | ICD-10-CM

## 2015-07-24 MED ORDER — HEPARIN SOD (PORK) LOCK FLUSH 100 UNIT/ML IV SOLN
500.0000 [IU] | Freq: Once | INTRAVENOUS | Status: AC | PRN
  Administered 2015-07-24: 500 [IU]
  Filled 2015-07-24: qty 5

## 2015-07-24 MED ORDER — PEGFILGRASTIM INJECTION 6 MG/0.6ML ~~LOC~~
6.0000 mg | PREFILLED_SYRINGE | Freq: Once | SUBCUTANEOUS | Status: AC
  Administered 2015-07-24: 6 mg via SUBCUTANEOUS
  Filled 2015-07-24: qty 0.6

## 2015-07-24 MED ORDER — SODIUM CHLORIDE 0.9 % IJ SOLN
10.0000 mL | INTRAMUSCULAR | Status: DC | PRN
  Administered 2015-07-24: 10 mL
  Filled 2015-07-24: qty 10

## 2015-07-24 NOTE — Progress Notes (Signed)
Neulasta injection given by infusion nurse after home infusion pump disconnected 

## 2015-07-24 NOTE — Patient Instructions (Signed)
Pegfilgrastim injection What is this medicine? PEGFILGRASTIM (PEG fil gra stim) is a long-acting granulocyte colony-stimulating factor that stimulates the growth of neutrophils, a type of white blood cell important in the body's fight against infection. It is used to reduce the incidence of fever and infection in patients with certain types of cancer who are receiving chemotherapy that affects the bone marrow, and to increase survival after being exposed to high doses of radiation. This medicine may be used for other purposes; ask your health care provider or pharmacist if you have questions. What should I tell my health care provider before I take this medicine? They need to know if you have any of these conditions: -kidney disease -latex allergy -ongoing radiation therapy -sickle cell disease -skin reactions to acrylic adhesives (On-Body Injector only) -an unusual or allergic reaction to pegfilgrastim, filgrastim, other medicines, foods, dyes, or preservatives -pregnant or trying to get pregnant -breast-feeding How should I use this medicine? This medicine is for injection under the skin. If you get this medicine at home, you will be taught how to prepare and give the pre-filled syringe or how to use the On-body Injector. Refer to the patient Instructions for Use for detailed instructions. Use exactly as directed. Take your medicine at regular intervals. Do not take your medicine more often than directed. It is important that you put your used needles and syringes in a special sharps container. Do not put them in a trash can. If you do not have a sharps container, call your pharmacist or healthcare provider to get one. Talk to your pediatrician regarding the use of this medicine in children. While this drug may be prescribed for selected conditions, precautions do apply. Overdosage: If you think you have taken too much of this medicine contact a poison control center or emergency room at  once. NOTE: This medicine is only for you. Do not share this medicine with others. What if I miss a dose? It is important not to miss your dose. Call your doctor or health care professional if you miss your dose. If you miss a dose due to an On-body Injector failure or leakage, a new dose should be administered as soon as possible using a single prefilled syringe for manual use. What may interact with this medicine? Interactions have not been studied. Give your health care provider a list of all the medicines, herbs, non-prescription drugs, or dietary supplements you use. Also tell them if you smoke, drink alcohol, or use illegal drugs. Some items may interact with your medicine. This list may not describe all possible interactions. Give your health care provider a list of all the medicines, herbs, non-prescription drugs, or dietary supplements you use. Also tell them if you smoke, drink alcohol, or use illegal drugs. Some items may interact with your medicine. What should I watch for while using this medicine? You may need blood work done while you are taking this medicine. If you are going to need a MRI, CT scan, or other procedure, tell your doctor that you are using this medicine (On-Body Injector only). What side effects may I notice from receiving this medicine? Side effects that you should report to your doctor or health care professional as soon as possible: -allergic reactions like skin rash, itching or hives, swelling of the face, lips, or tongue -dizziness -fever -pain, redness, or irritation at site where injected -pinpoint red spots on the skin -red or dark-brown urine -shortness of breath or breathing problems -stomach or side pain, or pain   at the shoulder -swelling -tiredness -trouble passing urine or change in the amount of urine Side effects that usually do not require medical attention (report to your doctor or health care professional if they continue or are  bothersome): -bone pain -muscle pain This list may not describe all possible side effects. Call your doctor for medical advice about side effects. You may report side effects to FDA at 1-800-FDA-1088. Where should I keep my medicine? Keep out of the reach of children. Store pre-filled syringes in a refrigerator between 2 and 8 degrees C (36 and 46 degrees F). Do not freeze. Keep in carton to protect from light. Throw away this medicine if it is left out of the refrigerator for more than 48 hours. Throw away any unused medicine after the expiration date. NOTE: This sheet is a summary. It may not cover all possible information. If you have questions about this medicine, talk to your doctor, pharmacist, or health care provider.    2016, [employer]. (2014-05-16 14:30:14)  

## 2015-08-04 ENCOUNTER — Telehealth: Payer: Self-pay | Admitting: *Deleted

## 2015-08-04 ENCOUNTER — Other Ambulatory Visit: Payer: Self-pay | Admitting: *Deleted

## 2015-08-04 ENCOUNTER — Other Ambulatory Visit: Payer: Self-pay | Admitting: Hematology

## 2015-08-04 NOTE — Telephone Encounter (Signed)
"  I'm scheduled for treatment tomorrow but I need to cancel.  I go to [facility] as well.  Dr Burr Medico knows my situation.  [facility] wants me to cancel three weeks for their treatment."   Will notify provider.  FIRI scheduled for 08-05-2015 and 08-07-2015 will need to be cancelled.

## 2015-08-05 ENCOUNTER — Ambulatory Visit: Payer: BLUE CROSS/BLUE SHIELD

## 2015-08-07 ENCOUNTER — Encounter: Payer: Self-pay | Admitting: Hematology

## 2015-08-07 ENCOUNTER — Ambulatory Visit: Payer: BLUE CROSS/BLUE SHIELD

## 2015-08-08 ENCOUNTER — Telehealth: Payer: Self-pay | Admitting: *Deleted

## 2015-08-08 ENCOUNTER — Telehealth: Payer: Self-pay

## 2015-08-08 ENCOUNTER — Telehealth: Payer: Self-pay | Admitting: Hematology

## 2015-08-08 ENCOUNTER — Other Ambulatory Visit: Payer: Self-pay | Admitting: Hematology

## 2015-08-08 NOTE — Telephone Encounter (Signed)
Patient calling to schedule lab, Dr. Burr Medico and infusion- he was hoping to get in Tuesday 08/12/15.  Patient states that he went to [facility] and is on hold there as far as any treatment.  He was advised to come back to [facility] and continue his treatment here until he is notified by them.

## 2015-08-08 NOTE — Telephone Encounter (Signed)
R/s pt appt per 3/31 pof. Pt aware per desk nurse

## 2015-08-08 NOTE — Telephone Encounter (Signed)
Received vm call from pt requesting return call.  Called pt & he states that he is back from [facility] as of Saturday & heard from them last eve & they want to hold off on the chemo bath & to cont q 3 wk chemo & see him back after 4 treatments/60mo to re-eval.  He states that the lung biopsy was OK but there is an area around belly button that has grown & they want this to decrease before doing procedure.  Message to Dr Burr Medico.  Pt wants to come in tues for chemo & questions if he needs to see Dr Burr Medico.

## 2015-08-08 NOTE — Telephone Encounter (Signed)
Urgent POF was sent.  Samuel Durham

## 2015-08-08 NOTE — Telephone Encounter (Signed)
Per staff message and POF I have scheduled appts. Advised scheduler of appts. JMW  

## 2015-08-12 ENCOUNTER — Other Ambulatory Visit: Payer: BLUE CROSS/BLUE SHIELD

## 2015-08-12 ENCOUNTER — Ambulatory Visit: Payer: BLUE CROSS/BLUE SHIELD | Admitting: Hematology

## 2015-08-12 ENCOUNTER — Ambulatory Visit (HOSPITAL_BASED_OUTPATIENT_CLINIC_OR_DEPARTMENT_OTHER): Payer: BLUE CROSS/BLUE SHIELD | Admitting: Hematology

## 2015-08-12 ENCOUNTER — Inpatient Hospital Stay
Admission: RE | Admit: 2015-08-12 | Discharge: 2015-08-12 | Disposition: A | Discharge status: Home / Self Care | Payer: Self-pay | Source: Ambulatory Visit | Attending: Hematology | Admitting: Hematology

## 2015-08-12 ENCOUNTER — Encounter: Payer: Self-pay | Admitting: Hematology

## 2015-08-12 ENCOUNTER — Other Ambulatory Visit (HOSPITAL_BASED_OUTPATIENT_CLINIC_OR_DEPARTMENT_OTHER): Payer: BLUE CROSS/BLUE SHIELD

## 2015-08-12 ENCOUNTER — Other Ambulatory Visit: Payer: Self-pay | Admitting: Hematology

## 2015-08-12 ENCOUNTER — Ambulatory Visit: Payer: BLUE CROSS/BLUE SHIELD

## 2015-08-12 ENCOUNTER — Ambulatory Visit (HOSPITAL_BASED_OUTPATIENT_CLINIC_OR_DEPARTMENT_OTHER): Payer: BLUE CROSS/BLUE SHIELD

## 2015-08-12 VITALS — BP 118/63 | HR 60 | Temp 97.9°F | Resp 17 | Ht 66.0 in | Wt 297.5 lb

## 2015-08-12 DIAGNOSIS — D638 Anemia in other chronic diseases classified elsewhere: Secondary | ICD-10-CM

## 2015-08-12 DIAGNOSIS — D509 Iron deficiency anemia, unspecified: Secondary | ICD-10-CM

## 2015-08-12 DIAGNOSIS — C786 Secondary malignant neoplasm of retroperitoneum and peritoneum: Secondary | ICD-10-CM

## 2015-08-12 DIAGNOSIS — D630 Anemia in neoplastic disease: Secondary | ICD-10-CM

## 2015-08-12 DIAGNOSIS — C169 Malignant neoplasm of stomach, unspecified: Secondary | ICD-10-CM

## 2015-08-12 DIAGNOSIS — D6481 Anemia due to antineoplastic chemotherapy: Secondary | ICD-10-CM

## 2015-08-12 DIAGNOSIS — Z5111 Encounter for antineoplastic chemotherapy: Secondary | ICD-10-CM

## 2015-08-12 DIAGNOSIS — C7800 Secondary malignant neoplasm of unspecified lung: Secondary | ICD-10-CM | POA: Diagnosis not present

## 2015-08-12 DIAGNOSIS — C801 Malignant (primary) neoplasm, unspecified: Secondary | ICD-10-CM

## 2015-08-12 DIAGNOSIS — R180 Malignant ascites: Secondary | ICD-10-CM

## 2015-08-12 DIAGNOSIS — Z95828 Presence of other vascular implants and grafts: Secondary | ICD-10-CM

## 2015-08-12 LAB — CBC WITH DIFFERENTIAL/PLATELET
BASO%: 0.3 % (ref 0.0–2.0)
Basophils Absolute: 0 10*3/uL (ref 0.0–0.1)
EOS%: 1.5 % (ref 0.0–7.0)
Eosinophils Absolute: 0.1 10*3/uL (ref 0.0–0.5)
HCT: 35.8 % — ABNORMAL LOW (ref 38.4–49.9)
HEMOGLOBIN: 11.5 g/dL — AB (ref 13.0–17.1)
LYMPH%: 20.1 % (ref 14.0–49.0)
MCH: 28.9 pg (ref 27.2–33.4)
MCHC: 32.1 g/dL (ref 32.0–36.0)
MCV: 89.9 fL (ref 79.3–98.0)
MONO#: 0.9 10*3/uL (ref 0.1–0.9)
MONO%: 15 % — AB (ref 0.0–14.0)
NEUT%: 63.1 % (ref 39.0–75.0)
NEUTROS ABS: 3.8 10*3/uL (ref 1.5–6.5)
PLATELETS: 215 10*3/uL (ref 140–400)
RBC: 3.98 10*6/uL — AB (ref 4.20–5.82)
RDW: 16 % — AB (ref 11.0–14.6)
WBC: 6.1 10*3/uL (ref 4.0–10.3)
lymph#: 1.2 10*3/uL (ref 0.9–3.3)

## 2015-08-12 LAB — COMPREHENSIVE METABOLIC PANEL
ALK PHOS: 128 U/L (ref 40–150)
ALT: 14 U/L (ref 0–55)
ANION GAP: 6 meq/L (ref 3–11)
AST: 21 U/L (ref 5–34)
Albumin: 3.1 g/dL — ABNORMAL LOW (ref 3.5–5.0)
BILIRUBIN TOTAL: 0.45 mg/dL (ref 0.20–1.20)
BUN: 19 mg/dL (ref 7.0–26.0)
CO2: 28 meq/L (ref 22–29)
Calcium: 9 mg/dL (ref 8.4–10.4)
Chloride: 108 mEq/L (ref 98–109)
Creatinine: 0.8 mg/dL (ref 0.7–1.3)
GLUCOSE: 94 mg/dL (ref 70–140)
POTASSIUM: 4.2 meq/L (ref 3.5–5.1)
SODIUM: 142 meq/L (ref 136–145)
TOTAL PROTEIN: 6.4 g/dL (ref 6.4–8.3)

## 2015-08-12 MED ORDER — LEUCOVORIN CALCIUM INJECTION 350 MG
400.0000 mg/m2 | Freq: Once | INTRAVENOUS | Status: AC
  Administered 2015-08-12: 996 mg via INTRAVENOUS
  Filled 2015-08-12: qty 49.8

## 2015-08-12 MED ORDER — SODIUM CHLORIDE 0.9 % IV SOLN
Freq: Once | INTRAVENOUS | Status: AC
  Administered 2015-08-12: 11:00:00 via INTRAVENOUS
  Filled 2015-08-12: qty 8

## 2015-08-12 MED ORDER — SODIUM CHLORIDE 0.9 % IV SOLN
Freq: Once | INTRAVENOUS | Status: AC
  Administered 2015-08-12: 11:00:00 via INTRAVENOUS

## 2015-08-12 MED ORDER — PROCHLORPERAZINE MALEATE 10 MG PO TABS
10.0000 mg | ORAL_TABLET | Freq: Once | ORAL | Status: AC
  Administered 2015-08-12: 10 mg via ORAL

## 2015-08-12 MED ORDER — SODIUM CHLORIDE 0.9 % IV SOLN
2200.0000 mg/m2 | INTRAVENOUS | Status: DC
  Administered 2015-08-12: 5500 mg via INTRAVENOUS
  Filled 2015-08-12: qty 110

## 2015-08-12 MED ORDER — IRINOTECAN HCL CHEMO INJECTION 100 MG/5ML
180.0000 mg/m2 | Freq: Once | INTRAVENOUS | Status: AC
  Administered 2015-08-12: 448 mg via INTRAVENOUS
  Filled 2015-08-12: qty 7.47

## 2015-08-12 MED ORDER — SODIUM CHLORIDE 0.9% FLUSH
10.0000 mL | INTRAVENOUS | Status: DC | PRN
  Administered 2015-08-12: 10 mL via INTRAVENOUS
  Filled 2015-08-12: qty 10

## 2015-08-12 MED ORDER — PROCHLORPERAZINE MALEATE 10 MG PO TABS
ORAL_TABLET | ORAL | Status: AC
  Filled 2015-08-12: qty 1

## 2015-08-12 NOTE — Progress Notes (Signed)
[place]  Telephone:(336) [phone] Fax:([phone]) [phone]  Clinic follow-up Note   Patient Care Team: No Pcp Per Patient as PCP - General (General Practice) 08/12/2015  CHIEF COMPLAINTS Follow up metastatic gastric cancer    Gastric adenocarcinoma: Stage IV   04/23/2014 Imaging CT abdomen showed peritoneal carcinomatosis, ascites, thickning of gastric and GEJ wall, CT chest showed a few small b/l lung nodules.    04/24/2014 Tumor Marker CEA  5.6, CA19.9 13   04/25/2014 Initial Diagnosis Gastric adenocarcinoma: Stage IV   04/25/2014 Pathology Results Stomach mass biopsy showed - ADENOCARCINOMA WITH SIGNET RING FEATURES. ascites cytology (+)   04/25/2014 Procedure EGD: There was a large, nearly circumferential, ulcerated, clearly malignant mass that stradles the GE junction. The vast bulk of the mass lays in the stomach.    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 I saw him first when he was recently admitted to [facility]. He is here for the first office visit after hospital discharge.  He was  admitted on 12/15 with progressive, 4 month history of dysphagia, epigastric abdominal discomfort and bloating, early satiation accompanied by nausea, and postprandial vomiting and diarrhea. He has experienced a 30 lbs weight loss in the process. He has noted increased abdominal girth. Patient denies any shortness of breath, chest pain, GI bleed, hematuria or lower extremity swelling. He denies heavy alcohol intake. Denies tobacco abuse. Denies risk factors for  HIV or hepatitis. He denies any family history of GI malignancies.  CT of the abdomen and pelvis with contrast on 12/15 revealed several pulmonary nodules at the bases bilaterally, profound thickening of the distal esophagus extending beyond the gastroesophageal junction into the proximal stomach,cardia and fundus of the stomach, most evident along the lesser curvature. No pathologic dilatation of small bowel or colon. Extensive upper abdominal and retroperitoneal lymphadenopathy was visualized. Moderate volume of ascites, presumably malignant, was noted. Extensive soft tissue nodularity throughout the omentum, compatible with omental caking and widespread peritoneal metastasis. Additionally, there are other areas of enhancing nodularity along the peritoneal surface. Tiny ventral and umbilical hernias containing predominantly omental fat, including some omental implants. There are no aggressive appearing lytic or blastic lesions noted in the visualized portions of the skeleton.  He underwent an EGD by Dr. Ardis Hughs on 04/25/2014, which showed a large fungating mass that straddles the GE junction and occupying the upper 1/3 part of the stomach. Multiple biopsy was taken which showed adenocarcinoma with signet ring features. He also underwent a paracentesis with 4.5 L fluid is removed on 04/26/2014. Cytology was positive for adenocarcinoma with signet ring features, consistent with metastatic disease.  He was discharged to home on 04/27/2014. He did feel a slightly informant of his abdominal bloating after the paracentesis, but not much improvement other symptoms. He had Mediport placement and the second paracentesis was again 4 L fluids removal on 05/02/2014. His appetite is low, he drinks ensure 3 bottles a day, some soup and a very little other foods. His energy level is also low, but able to take care of all his daily needs, and  move around with about much difficulty. His abdominal discomfort/pain are  relatively controlled by morphine 15 mg at night and tramadol 2-3 times during the day. He denies any fever, chills, cough, chest pain or any in the other discomfort.  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  INTERIM HISTORY: Samuel Durham returns for follow up. He followed up with his oncologist at [facility] last week, underwent restaging CT scan and lung biopsy which was negative, due to the concern of slight disease progression in the peritonum, they recommend continue chemotherapy and a repeat scan in 2 months. HIPEC was not offered this time. Dameian returns for chemotherapy today. He feels well, denies any significant pain, abdominal bloating, nausea or other symptoms. His energy level and appetite has been good, no change. No other new complaints.  MEDICAL HISTORY:  Past Medical History  Diagnosis Date  . Medical history non-contributory   . gastric ca dx'd 04/2014    SURGICAL HISTORY: Past Surgical History  Procedure Laterality Date  . Squamous cell carcinoma excision  approx March 2015    removed from top of head  . Esophagogastroduodenoscopy (egd) with propofol N/A 04/25/2014    Procedure: ESOPHAGOGASTRODUODENOSCOPY (EGD) WITH PROPOFOL;  Surgeon: Milus Banister, MD;  Location: [facility];  Service: Endoscopy;  Laterality: N/A;    SOCIAL HISTORY: Social History   Social History  . Marital Status: Single    Spouse Name: N/A  . Number of Children: N/A  . Years of Education: N/A   Occupational History  . Not on file.   Social History Main Topics  . Smoking status: Never Smoker   . Smokeless tobacco: Never Used  . Alcohol Use: No  . Drug Use: No  . Sexual Activity: Not on file   Other Topics Concern  . Not on file   Social History Narrative   Single, lives alone with pet dog (rescue dog)   Works as Information systems manager; drives; independent   Middle child  between #2 sisters-his mother and sisters are very supportive    FAMILY HISTORY: Family History  Problem Relation Age of Onset  . Breast cancer Sister 52   . Hypertension Mother   . Hypertension Father   Maternal aunt breast cancer at age of 107 Maternal cousin ovarian cancer at age of 25 Maternal ancle prostate cancer age of 24   ALLERGIES:  has No Known Allergies.  MEDICATIONS:  Current Outpatient Prescriptions  Medication Sig Dispense Refill  . docusate sodium 100 MG CAPS Take 100 mg by mouth 2 (two) times daily. 10 capsule 0  . lidocaine-prilocaine (EMLA) cream Apply 1 application topically as needed. 30 g 2  . mirtazapine (REMERON) 15 MG tablet Take 1 tablet (15 mg total) by mouth at bedtime. 30 tablet 1  . ondansetron (ZOFRAN) 4 MG tablet Take 1 tablet (4 mg total) by mouth every 8 (eight) hours as needed for nausea or vomiting. 20 tablet 0  . pantoprazole (PROTONIX) 40 MG tablet Take 1 tablet (40 mg total) by mouth daily. 30 tablet 0  . morphine (MS CONTIN) 15 MG 12 hr tablet Take 1 tablet (15 mg total) by mouth every 12 (twelve) hours. (Patient not taking: Reported on 05/26/2015) 60 tablet 0  . prochlorperazine (COMPAZINE) 10 MG tablet Take 1 tablet (10 mg total) by mouth every 6 (six) hours as needed for nausea or vomiting. (Patient not taking: Reported on 07/22/2015)  30 tablet 3  . traMADol (ULTRAM) 50 MG tablet Take 1-2 tablets (50-100 mg total) by mouth every 6 (six) hours as needed for moderate pain. (Patient not taking: Reported on 07/22/2015) 30 tablet 0   No current facility-administered medications for this visit.   Facility-Administered Medications Ordered in Other Visits  Medication Dose Route Frequency Provider Last Rate Last Dose  . sodium chloride 0.9 % injection 10 mL  10 mL Intravenous PRN Truitt Merle, MD        REVIEW OF SYSTEMS:   Constitutional: Denies fevers, chills or abnormal night sweats, no recent weight loss Eyes: Denies blurriness of vision, double  vision or watery eyes Ears, nose, mouth, throat, and face: Denies mucositis or sore throat Respiratory: Denies cough, dyspnea or wheezes Cardiovascular: Denies palpitation, chest discomfort or lower extremity swelling Gastrointestinal:  Less nausea, abdominal bloating and discomfort Skin: Denies abnormal skin rashes Lymphatics: Denies new lymphadenopathy or easy bruising Neurological:Denies numbness, tingling or new weaknesses Behavioral/Psych: Mood is stable, no new changes  All other systems were reviewed with the patient and are negative except those mentioned in the history.  PHYSICAL EXAMINATION: ECOG PERFORMANCE STATUS: 1 BP 118/63 mmHg  Pulse 60  Temp(Src) 97.9 F (36.6 C) (Oral)  Resp 17  Ht 5' 6"  (1.676 m)  Wt 297 lb 8 oz (134.945 kg)  BMI 48.04 kg/m2  SpO2 99%  GENERAL:alert, no distress and comfortable SKIN: skin color, texture, turgor are normal, no rashes or significant lesions EYES: normal, conjunctiva are pink and non-injected, sclera clear OROPHARYNX:no exudate, no erythema and lips, buccal mucosa, and tongue normal  NECK: supple, thyroid normal size, non-tender, without nodularity LYMPH:  no palpable lymphadenopathy in the cervical, axillary or inguinal LUNGS: clear to auscultation and percussion with normal breathing effort HEART: regular rate & rhythm and no murmurs and no lower extremity edema ABDOMEN:abdomen soft, non-tender and normal bowel sounds, (+) small and is 1 which is well. amount of ascites. Musculoskeletal:no cyanosis of digits and no clubbing  PSYCH: alert & oriented x 3 with fluent speech NEURO: no focal motor/sensory deficits  LABORATORY DATA:  I have reviewed the data as listed CBC Latest Ref Rng 08/12/2015 07/22/2015 07/08/2015  WBC 4.0 - 10.3 10e3/uL 6.1 5.4 5.0  Hemoglobin 13.0 - 17.1 g/dL 11.5(L) 11.6(L) 11.3(L)  Hematocrit 38.4 - 49.9 % 35.8(L) 36.8(L) 34.8(L)  Platelets 140 - 400 10e3/uL 215 177 196      CMP Latest Ref Rng 08/12/2015  07/22/2015 07/08/2015  Glucose 70 - 140 mg/dl 94 59(L) 99  BUN 7.0 - 26.0 mg/dL 19.0 20.9 16.4  Creatinine 0.7 - 1.3 mg/dL 0.8 0.9 0.9  Sodium 136 - 145 mEq/L 142 144 141  Potassium 3.5 - 5.1 mEq/L 4.2 3.9 4.2  CO2 22 - 29 mEq/L 28 29 27   Calcium 8.4 - 10.4 mg/dL 9.0 9.1 8.8  Total Protein 6.4 - 8.3 g/dL 6.4 6.8 6.7  Total Bilirubin 0.20 - 1.20 mg/dL 0.45 0.32 0.48  Alkaline Phos 40 - 150 U/L 128 161(H) 153(H)  AST 5 - 34 U/L 21 20 19   ALT 0 - 55 U/L 14 19 19    Results for Samuel, Durham (MRN [id number]) as of 08/12/2015 19:54  Ref. Range 03/25/2015 08:20 04/15/2015 08:32 05/14/2015 12:23 06/24/2015 11:23 07/22/2015 10:13  CEA Latest Units: ng/mL 5.3 (H) 3.7 2.9 1.7 2.0  CEA Latest Ref Range: 0.0-4.7 ng/mL    3.0 3.8   (CEA was parallel tested in 2 different labs)   Pathology report  Diagnosis  06/07/2014 PERITONEAL/ASCITIC FLUID(SPECIMEN 1 OF 1 COLLECTED 04/26/14): METASTATIC ADENOCARCINOMA WITH SIGNET RING CELL FEATURES.  Stomach, biopsy, r/o cancer 04/25/14 - ADENOCARCINOMA WITH SIGNET RING FEATURES. Microscopic Comment The results were called to Dr. Ardis Hughs on 04/26/2014. (JDP:kh 04/26/14) Claudette Laws MD Pathologist, Electronic Signature (Case signed 04/26/2014)  HER-2/NEU BY CISH - NO AMPLIFICATION OF HER-2 DETECTED. RESULT RATIO OF HER2: CEP 17 SIGNALS 1.20 AVERAGE HER2 COPY NUMBER PER CELL 2.10  ADDITIONAL INFORMATION: Mismatch Repair (MMR) Protein Immunohistochemistry (IHC) IHC Expression Result: MLH1: Preserved nuclear expression (greater 50% tumor expression) MSH2: Preserved nuclear expression (greater 50% tumor expression) MSH6: Preserved nuclear expression (greater 50% tumor expression) PMS2: Preserved nuclear expression (greater 50% tumor expression) * Internal control demonstrates intact nuclear expression Interpretation: NORMAL There is preserved expression of the major and minor MMR proteins. There is a very low probability that microsatellite instability (MSI) is  present. However, certain clinically significant MMR protein mutations may result in preservation of nuclear expression. It is recommended that the preservation of protein expression be correlated with molecular based MSI testing.  [facility] testing: (+) OJ50 and NF1   RADIOGRAPHIC STUDIES: I have personally reviewed the radiological images as listed and agreed with the findings in the report  CT chest, abdomen and pelvis with contrast, 07/31/2015 at [facility] IMPRESSION: 1. On physical metastasis has increased in size  2. No significant change in gastric mass, peritoneal carcinomatosis, lung metastasis. 3.  stable splenomegaly.   ASSESSMENT & PLAN:  40 year old gentleman without significant past medical history presents with abdominal bloating, nausea, vomiting, diarrhea, anemia and weight loss. EGD showed a large mass arising from the gastric fundus and extending into GE junction, biopsy showed adenocarcinoma with signet ring features, CT scan is consistent with peritoneal carcinomatosis and bilateral lung metastasis. Ascites cytology was positive for malignant cells.  1. 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 HER-2 negative, MMR-normal -I discussed his surgical biopsy results, cytology results, scan findings with patient and his family members, including his parents and 2 sisters. Unfortunately, this is widely metastatic, not curable, and overall prognosis is very poor.  -His tumor is negative for HER-2 overexpression, no role for trastuzumab. -he had excellent response to first-line chemotherapy FOLFOX, currently on second line FOLFIRI due to disease progression. -I reviewed his recently restaging CT from 07/31/2015 which was done at [facility]. It reported stable gastric mass, peritoneal carcinomatosis and lung metastasis. It raised a question that the umbilical metastasis has increased in size. I reviewed the image  with our radiologist in [place] today, the umbilical metastasis appears to be a hernia, increase of the size could be related to the increase in size of hernia. The overall impression is stable peritoneal carcinomatosis. -I agree with [facility]'s recommendation to continue his second line FOLFIRI, no need to change his chemo regimen since there is no significant disease progression and pt is clinically doing well  -His CEA is coming down again, clinically doing better,  recurrent ascites has most resolved, which is consistent with her good response to chemo. -surgical debulking and HIPEC was discussed by his oncologist Dr. Liane Comber and surgeon at [facility] again a few weeks ago and it's on hold for now.  -continue chemo for now   2. Recurrent malignant ascites -s/p paracentesis at the beginning of his diagnosis, and again on 03/10/15 and 03/24/15, and 11/23, 12/6 and 1/6.  His ascites has near resolved again. -We'll  Continue monitoring  3. Anemia,  multifactorial (iron deficient anemia, cancer related and chemotherapy induced ) -His ferritin level was normal at 171, but he has low iron and saturation, likely some degree of iron deficient anemia. -He has received IV Feraheme 510 mg twice, 2 weeks apart -Blood transfusion as needed during chemotherapy if her hemoglobin below 8. -Mild anemia has been stable.   Plan -continue chemotherapy FOLFIRI today and every 2 weeks with Neulasta on day 3 - I'll see him back in 2 weeks  The patient knows to call the clinic with any problems, questions or concerns.  I spent 20 minutes counseling the patient face to face. The total time spent in the appointment was 30  minutes and more than 50% was on counseling    Truitt Merle, MD 08/12/2015

## 2015-08-12 NOTE — Patient Instructions (Signed)
[facility] Discharge Instructions for Patients Receiving Chemotherapy  Today you received the following chemotherapy agents: 5-FU, Leucovorin, Camtosar.  To help prevent nausea and vomiting after your treatment, we encourage you to take your nausea medications: Compazine 10mg  every 6hrs as needed, zofran 4mg  every 8hrs as needed.    If you develop nausea and vomiting that is not controlled by your nausea medication, call the clinic.   BELOW ARE SYMPTOMS THAT SHOULD BE REPORTED IMMEDIATELY:  *FEVER GREATER THAN 100.5 F  *CHILLS WITH OR WITHOUT FEVER  NAUSEA AND VOMITING THAT IS NOT CONTROLLED WITH YOUR NAUSEA MEDICATION  *UNUSUAL SHORTNESS OF BREATH  *UNUSUAL BRUISING OR BLEEDING  TENDERNESS IN MOUTH AND THROAT WITH OR WITHOUT PRESENCE OF ULCERS  *URINARY PROBLEMS  *BOWEL PROBLEMS  UNUSUAL RASH Items with * indicate a potential emergency and should be followed up as soon as possible.  Feel free to call the clinic you have any questions or concerns. The clinic phone number is [phone].  Please show the [place] at check-in to the Emergency Department and triage nurse.

## 2015-08-12 NOTE — Patient Instructions (Signed)

## 2015-08-14 ENCOUNTER — Telehealth: Payer: Self-pay | Admitting: Hematology

## 2015-08-14 ENCOUNTER — Ambulatory Visit (HOSPITAL_BASED_OUTPATIENT_CLINIC_OR_DEPARTMENT_OTHER): Payer: BLUE CROSS/BLUE SHIELD

## 2015-08-14 ENCOUNTER — Ambulatory Visit: Payer: BLUE CROSS/BLUE SHIELD

## 2015-08-14 VITALS — BP 106/64 | HR 53 | Temp 97.7°F | Resp 18

## 2015-08-14 DIAGNOSIS — Z5189 Encounter for other specified aftercare: Secondary | ICD-10-CM | POA: Diagnosis not present

## 2015-08-14 DIAGNOSIS — C786 Secondary malignant neoplasm of retroperitoneum and peritoneum: Secondary | ICD-10-CM | POA: Diagnosis not present

## 2015-08-14 DIAGNOSIS — C7800 Secondary malignant neoplasm of unspecified lung: Secondary | ICD-10-CM

## 2015-08-14 DIAGNOSIS — C169 Malignant neoplasm of stomach, unspecified: Secondary | ICD-10-CM

## 2015-08-14 MED ORDER — PEGFILGRASTIM INJECTION 6 MG/0.6ML ~~LOC~~
6.0000 mg | PREFILLED_SYRINGE | Freq: Once | SUBCUTANEOUS | Status: AC
  Administered 2015-08-14: 6 mg via SUBCUTANEOUS
  Filled 2015-08-14: qty 0.6

## 2015-08-14 MED ORDER — HEPARIN SOD (PORK) LOCK FLUSH 100 UNIT/ML IV SOLN
500.0000 [IU] | Freq: Once | INTRAVENOUS | Status: AC | PRN
  Administered 2015-08-14: 500 [IU]
  Filled 2015-08-14: qty 5

## 2015-08-14 MED ORDER — SODIUM CHLORIDE 0.9 % IJ SOLN
10.0000 mL | INTRAMUSCULAR | Status: DC | PRN
  Administered 2015-08-14: 10 mL
  Filled 2015-08-14: qty 10

## 2015-08-14 NOTE — Patient Instructions (Signed)
[facility] Discharge Instructions for Patients Receiving Chemotherapy  Today you finished the following chemotherapy agents 5FU To help prevent nausea and vomiting after your treatment, we encourage you to take your nausea medication {as prescribed. If you develop nausea and vomiting that is not controlled by your nausea medication, call the clinic.   BELOW ARE SYMPTOMS THAT SHOULD BE REPORTED IMMEDIATELY:  *FEVER GREATER THAN 100.5 F  *CHILLS WITH OR WITHOUT FEVER  NAUSEA AND VOMITING THAT IS NOT CONTROLLED WITH YOUR NAUSEA MEDICATION  *UNUSUAL SHORTNESS OF BREATH  *UNUSUAL BRUISING OR BLEEDING  TENDERNESS IN MOUTH AND THROAT WITH OR WITHOUT PRESENCE OF ULCERS  *URINARY PROBLEMS  *BOWEL PROBLEMS  UNUSUAL RASH Items with * indicate a potential emergency and should be followed up as soon as possible.  Feel free to call the clinic you have any questions or concerns. The clinic phone number is [phone].  Please show the [place] at check-in to the Emergency Department and triage nurse.

## 2015-08-14 NOTE — Telephone Encounter (Signed)
gv and printed appt shced and avs for pt for April and May

## 2015-08-14 NOTE — Progress Notes (Signed)
Neulasta injection given by infusion nurse after home infusion pump disconnected 

## 2015-08-15 ENCOUNTER — Other Ambulatory Visit: Payer: BLUE CROSS/BLUE SHIELD

## 2015-08-15 ENCOUNTER — Ambulatory Visit: Payer: BLUE CROSS/BLUE SHIELD | Admitting: Hematology

## 2015-08-15 ENCOUNTER — Ambulatory Visit: Payer: BLUE CROSS/BLUE SHIELD

## 2015-08-17 ENCOUNTER — Encounter: Payer: Self-pay | Admitting: Hematology

## 2015-08-22 NOTE — Telephone Encounter (Signed)
This message was given to Dr Burr Medico on 08/21/15.

## 2015-08-25 ENCOUNTER — Other Ambulatory Visit: Payer: Self-pay | Admitting: *Deleted

## 2015-08-25 DIAGNOSIS — C169 Malignant neoplasm of stomach, unspecified: Secondary | ICD-10-CM

## 2015-08-26 ENCOUNTER — Ambulatory Visit: Payer: BLUE CROSS/BLUE SHIELD

## 2015-08-26 ENCOUNTER — Other Ambulatory Visit (HOSPITAL_BASED_OUTPATIENT_CLINIC_OR_DEPARTMENT_OTHER): Payer: BLUE CROSS/BLUE SHIELD

## 2015-08-26 ENCOUNTER — Ambulatory Visit (HOSPITAL_BASED_OUTPATIENT_CLINIC_OR_DEPARTMENT_OTHER): Payer: BLUE CROSS/BLUE SHIELD | Admitting: Hematology

## 2015-08-26 ENCOUNTER — Ambulatory Visit (HOSPITAL_BASED_OUTPATIENT_CLINIC_OR_DEPARTMENT_OTHER): Payer: BLUE CROSS/BLUE SHIELD

## 2015-08-26 ENCOUNTER — Encounter: Payer: Self-pay | Admitting: Hematology

## 2015-08-26 VITALS — BP 105/56 | HR 60 | Temp 98.1°F | Resp 17 | Ht 66.0 in | Wt 295.4 lb

## 2015-08-26 DIAGNOSIS — D6959 Other secondary thrombocytopenia: Secondary | ICD-10-CM

## 2015-08-26 DIAGNOSIS — C169 Malignant neoplasm of stomach, unspecified: Secondary | ICD-10-CM

## 2015-08-26 DIAGNOSIS — C786 Secondary malignant neoplasm of retroperitoneum and peritoneum: Secondary | ICD-10-CM

## 2015-08-26 DIAGNOSIS — D6481 Anemia due to antineoplastic chemotherapy: Secondary | ICD-10-CM

## 2015-08-26 DIAGNOSIS — Z5111 Encounter for antineoplastic chemotherapy: Secondary | ICD-10-CM

## 2015-08-26 DIAGNOSIS — R180 Malignant ascites: Secondary | ICD-10-CM

## 2015-08-26 DIAGNOSIS — C7800 Secondary malignant neoplasm of unspecified lung: Secondary | ICD-10-CM

## 2015-08-26 DIAGNOSIS — D630 Anemia in neoplastic disease: Secondary | ICD-10-CM

## 2015-08-26 DIAGNOSIS — D509 Iron deficiency anemia, unspecified: Secondary | ICD-10-CM

## 2015-08-26 DIAGNOSIS — C801 Malignant (primary) neoplasm, unspecified: Secondary | ICD-10-CM

## 2015-08-26 LAB — COMPREHENSIVE METABOLIC PANEL
ALBUMIN: 3.2 g/dL — AB (ref 3.5–5.0)
ALT: 17 U/L (ref 0–55)
ANION GAP: 6 meq/L (ref 3–11)
AST: 21 U/L (ref 5–34)
Alkaline Phosphatase: 136 U/L (ref 40–150)
BILIRUBIN TOTAL: 0.38 mg/dL (ref 0.20–1.20)
BUN: 22.3 mg/dL (ref 7.0–26.0)
CALCIUM: 9.1 mg/dL (ref 8.4–10.4)
CHLORIDE: 109 meq/L (ref 98–109)
CO2: 28 mEq/L (ref 22–29)
CREATININE: 0.9 mg/dL (ref 0.7–1.3)
EGFR: >90 mL/min/{1.73_m2} (ref >90)
Glucose: 88 mg/dl (ref 70–140)
Potassium: 4.3 mEq/L (ref 3.5–5.1)
Sodium: 143 mEq/L (ref 136–145)
TOTAL PROTEIN: 6.5 g/dL (ref 6.4–8.3)

## 2015-08-26 LAB — CBC WITH DIFFERENTIAL/PLATELET
BASO%: 0.4 % (ref 0.0–2.0)
Basophils Absolute: 0 10*3/uL (ref 0.0–0.1)
EOS%: 2.7 % (ref 0.0–7.0)
Eosinophils Absolute: 0.1 10*3/uL (ref 0.0–0.5)
HEMATOCRIT: 37.2 % — AB (ref 38.4–49.9)
HEMOGLOBIN: 11.9 g/dL — AB (ref 13.0–17.1)
LYMPH#: 1.1 10*3/uL (ref 0.9–3.3)
LYMPH%: 22.6 % (ref 14.0–49.0)
MCH: 28.4 pg (ref 27.2–33.4)
MCHC: 31.9 g/dL — ABNORMAL LOW (ref 32.0–36.0)
MCV: 88.9 fL (ref 79.3–98.0)
MONO#: 0.6 10*3/uL (ref 0.1–0.9)
MONO%: 11.3 % (ref 0.0–14.0)
NEUT%: 63 % (ref 39.0–75.0)
NEUTROS ABS: 3.2 10*3/uL (ref 1.5–6.5)
PLATELETS: 126 10*3/uL — AB (ref 140–400)
RBC: 4.18 10*6/uL — ABNORMAL LOW (ref 4.20–5.82)
RDW: 16.7 % — AB (ref 11.0–14.6)
WBC: 5 10*3/uL (ref 4.0–10.3)

## 2015-08-26 MED ORDER — SODIUM CHLORIDE 0.9 % IV SOLN
Freq: Once | INTRAVENOUS | Status: AC
  Administered 2015-08-26: 11:00:00 via INTRAVENOUS
  Filled 2015-08-26: qty 8

## 2015-08-26 MED ORDER — PROCHLORPERAZINE MALEATE 10 MG PO TABS
10.0000 mg | ORAL_TABLET | Freq: Once | ORAL | Status: AC
  Administered 2015-08-26: 10 mg via ORAL

## 2015-08-26 MED ORDER — SODIUM CHLORIDE 0.9 % IJ SOLN
10.0000 mL | Freq: Once | INTRAMUSCULAR | Status: DC
  Filled 2015-08-26: qty 10

## 2015-08-26 MED ORDER — SODIUM CHLORIDE 0.9 % IV SOLN
Freq: Once | INTRAVENOUS | Status: AC
  Administered 2015-08-26: 11:00:00 via INTRAVENOUS

## 2015-08-26 MED ORDER — FLUOROURACIL CHEMO INJECTION 5 GM/100ML
2200.0000 mg/m2 | INTRAVENOUS | Status: DC
  Administered 2015-08-26: 5500 mg via INTRAVENOUS
  Filled 2015-08-26: qty 110

## 2015-08-26 MED ORDER — DEXTROSE 5 % IV SOLN
400.0000 mg/m2 | Freq: Once | INTRAVENOUS | Status: AC
  Administered 2015-08-26: 996 mg via INTRAVENOUS
  Filled 2015-08-26: qty 49.8

## 2015-08-26 MED ORDER — PROCHLORPERAZINE MALEATE 10 MG PO TABS
ORAL_TABLET | ORAL | Status: AC
  Filled 2015-08-26: qty 1

## 2015-08-26 MED ORDER — IRINOTECAN HCL CHEMO INJECTION 100 MG/5ML
180.0000 mg/m2 | Freq: Once | INTRAVENOUS | Status: AC
  Administered 2015-08-26: 448 mg via INTRAVENOUS
  Filled 2015-08-26: qty 7

## 2015-08-26 NOTE — Progress Notes (Signed)
[place]  [phone]) [phone] [phone]) [phone]  Clinic follow-up Note   Patient Care Team: No Pcp Per Patient as PCP - General (General Practice) 08/26/2015  CHIEF COMPLAINTS Follow up metastatic gastric cancer    Gastric adenocarcinoma: Stage IV   04/23/2014 Imaging CT abdomen showed peritoneal carcinomatosis, ascites, thickning of gastric and GEJ wall, CT chest showed a few small b/l lung nodules.    04/24/2014 Tumor Marker CEA  5.6, CA19.9 13   04/25/2014 Initial Diagnosis Gastric adenocarcinoma: Stage IV   04/25/2014 Pathology Results Stomach mass biopsy showed - ADENOCARCINOMA WITH SIGNET RING FEATURES. ascites cytology (+)   04/25/2014 Procedure EGD: There was a large, nearly circumferential, ulcerated, clearly malignant mass that stradles the GE junction. The vast bulk of the mass lays in the stomach.    05/07/2014 - 03/11/2015 Chemotherapy mFOLFOX, with neulasta as needed, bolus 5FU omitted from cycle 6 due to cytopenia, stopped due to disease progression    06/03/2014 Miscellaneous [facility] genetic test (+) for:  NF1 splice site [id number] del 47 TP53 R213*    03/25/2015 -  Chemotherapy FOLFIRI, every 2 weeks    HISTORY OF PRESENTING ILLNESS (05/06/2014):  Samuel Durham 40 y.o. 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 I saw him first when he was recently admitted to [facility]. He is here for the first office visit after hospital discharge.  He was  admitted on 12/15 with progressive, 4 month history of dysphagia, epigastric abdominal discomfort and bloating, early satiation accompanied by nausea, and postprandial vomiting and diarrhea. He has experienced a 30 lbs weight loss in the process. He has noted increased abdominal girth. Patient denies any shortness of breath, chest pain, GI bleed, hematuria or lower extremity swelling. He denies heavy alcohol intake. Denies tobacco abuse. Denies risk factors  for HIV or hepatitis. He denies any family history of GI malignancies.  CT of the abdomen and pelvis with contrast on 12/15 revealed several pulmonary nodules at the bases bilaterally, profound thickening of the distal esophagus extending beyond the gastroesophageal junction into the proximal stomach,cardia and fundus of the stomach, most evident along the lesser curvature. No pathologic dilatation of small bowel or colon. Extensive upper abdominal and retroperitoneal lymphadenopathy was visualized. Moderate volume of ascites, presumably malignant, was noted. Extensive soft tissue nodularity throughout the omentum, compatible with omental caking and widespread peritoneal metastasis. Additionally, there are other areas of enhancing nodularity along the peritoneal surface. Tiny ventral and umbilical hernias containing predominantly omental fat, including some omental implants. There are no aggressive appearing lytic or blastic lesions noted in the visualized portions of the skeleton.  He underwent an EGD by Dr. Ardis Hughs on 04/25/2014, which showed a large fungating mass that straddles the GE junction and occupying the upper 1/3 part of the stomach. Multiple biopsy was taken which showed adenocarcinoma with signet ring features. He also underwent a paracentesis with 4.5 L fluid is removed on 04/26/2014. Cytology was positive for adenocarcinoma with signet ring features, consistent with metastatic disease.  He was discharged to home on 04/27/2014. He did feel a slightly informant of his abdominal bloating after the paracentesis, but not much improvement other symptoms. He had Mediport placement and the second paracentesis was again 4 L fluids removal on 05/02/2014. His appetite is low, he drinks ensure 3 bottles a day, some soup and a very little other foods. His energy level is also low, but able to take care of all his daily needs, and  move around with about much difficulty. His abdominal discomfort/pain are  relatively controlled by morphine 15 mg at night and tramadol 2-3 times during the day. He denies any fever, chills, cough, chest pain or any in the other discomfort.  [facility] genetic test (08/16/1446): NF1 splice site [id number] del 47 TP53 R213*   CURRENT THERAPY: FOLFIRI every 2 weeks, started on 03/25/2015.  INTERIM HISTORY: Samuel Durham returns for follow up. He is clinically doing very well overall. He denies any new symptoms, no abdominal bloating, or other discomfort. He has good appetite and energy level, he has gained about 6 lbs in the past month. He has been tolerating chemo very well as usual. No other complains.   MEDICAL HISTORY:  Past Medical History  Diagnosis Date  . Medical history non-contributory   . gastric ca dx'd 04/2014    SURGICAL HISTORY: Past Surgical History  Procedure Laterality Date  . Squamous cell carcinoma excision  approx March 2015    removed from top of head  . Esophagogastroduodenoscopy (egd) with propofol N/A 04/25/2014    Procedure: ESOPHAGOGASTRODUODENOSCOPY (EGD) WITH PROPOFOL;  Surgeon: Milus Banister, MD;  Location: [facility];  Service: Endoscopy;  Laterality: N/A;    SOCIAL HISTORY: Social History   Social History  . Marital Status: Single    Spouse Name: N/A  . Number of Children: N/A  . Years of Education: N/A   Occupational History  . Not on file.   Social History Main Topics  . Smoking status: Never Smoker   . Smokeless tobacco: Never Used  . Alcohol Use: No  . Drug Use: No  . Sexual Activity: Not on file   Other Topics Concern  . Not on file   Social History Narrative   Single, lives alone with pet dog (rescue dog)   Works as Information systems manager; drives; independent   Middle child between #2 sisters-his mother and sisters are very supportive    FAMILY HISTORY: Family History  Problem Relation Age of Onset  . Breast cancer Sister 40   . Hypertension Mother   . Hypertension Father   Maternal aunt breast  cancer at age of 38 Maternal cousin ovarian cancer at age of 14 Maternal ancle prostate cancer age of 13   ALLERGIES:  has No Known Allergies.  MEDICATIONS:  Current Outpatient Prescriptions  Medication Sig Dispense Refill  . docusate sodium 100 MG CAPS Take 100 mg by mouth 2 (two) times daily. 10 capsule 0  . lidocaine-prilocaine (EMLA) cream Apply 1 application topically as needed. 30 g 2  . mirtazapine (REMERON) 15 MG tablet Take 1 tablet (15 mg total) by mouth at bedtime. 30 tablet 1  . ondansetron (ZOFRAN) 4 MG tablet Take 1 tablet (4 mg total) by mouth every 8 (eight) hours as needed for nausea or vomiting. 20 tablet 0  . pantoprazole (PROTONIX) 40 MG tablet Take 1 tablet (40 mg total) by mouth daily. 30 tablet 0  . morphine (MS CONTIN) 15 MG 12 hr tablet Take 1 tablet (15 mg total) by mouth every 12 (twelve) hours. (Patient not taking: Reported on 05/26/2015) 60 tablet 0  . prochlorperazine (COMPAZINE) 10 MG tablet Take 1 tablet (10 mg total) by mouth every 6 (six) hours as needed for nausea or vomiting. (Patient not taking: Reported on 07/22/2015) 30 tablet 3  . traMADol (ULTRAM) 50 MG tablet Take 1-2 tablets (50-100 mg total) by mouth every 6 (six) hours as needed for moderate pain. (Patient not taking: Reported on 07/22/2015)  30 tablet 0   No current facility-administered medications for this visit.   Facility-Administered Medications Ordered in Other Visits  Medication Dose Route Frequency Provider Last Rate Last Dose  . sodium chloride 0.9 % injection 10 mL  10 mL Intravenous PRN Truitt Merle, MD      . sodium chloride 0.9 % injection 10 mL  10 mL Intracatheter Once Truitt Merle, MD   10 mL at 08/26/15 1314    REVIEW OF SYSTEMS:   Constitutional: Denies fevers, chills or abnormal night sweats, no recent weight loss Eyes: Denies blurriness of vision, double vision or watery eyes Ears, nose, mouth, throat, and face: Denies mucositis or sore throat Respiratory: Denies cough, dyspnea or  wheezes Cardiovascular: Denies palpitation, chest discomfort or lower extremity swelling Gastrointestinal:  Less nausea, abdominal bloating and discomfort Skin: Denies abnormal skin rashes Lymphatics: Denies new lymphadenopathy or easy bruising Neurological:Denies numbness, tingling or new weaknesses Behavioral/Psych: Mood is stable, no new changes  All other systems were reviewed with the patient and are negative except those mentioned in the history.  PHYSICAL EXAMINATION: ECOG PERFORMANCE STATUS: 1 BP 105/56 mmHg  Pulse 60  Temp(Src) 98.1 F (36.7 C) (Oral)  Resp 17  Ht 5' 6"  (1.676 m)  Wt 295 lb 6.4 oz (133.993 kg)  BMI 47.70 kg/m2  SpO2 100%  GENERAL:alert, no distress and comfortable SKIN: skin color, texture, turgor are normal, no rashes or significant lesions EYES: normal, conjunctiva are pink and non-injected, sclera clear OROPHARYNX:no exudate, no erythema and lips, buccal mucosa, and tongue normal  NECK: supple, thyroid normal size, non-tender, without nodularity LYMPH:  no palpable lymphadenopathy in the cervical, axillary or inguinal LUNGS: clear to auscultation and percussion with normal breathing effort HEART: regular rate & rhythm and no murmurs and no lower extremity edema ABDOMEN:abdomen soft, non-tender and normal bowel sounds, (+) small and is 1 which is well. amount of ascites. Musculoskeletal:no cyanosis of digits and no clubbing  PSYCH: alert & oriented x 3 with fluent speech NEURO: no focal motor/sensory deficits  LABORATORY DATA:  I have reviewed the data as listed CBC Latest Ref Rng 08/26/2015 08/12/2015 07/22/2015  WBC 4.0 - 10.3 10e3/uL 5.0 6.1 5.4  Hemoglobin 13.0 - 17.1 g/dL 11.9(L) 11.5(L) 11.6(L)  Hematocrit 38.4 - 49.9 % 37.2(L) 35.8(L) 36.8(L)  Platelets 140 - 400 10e3/uL 126(L) 215 177      CMP Latest Ref Rng 08/26/2015 08/12/2015 07/22/2015  Glucose 70 - 140 mg/dl 88 94 59(L)  BUN 7.0 - 26.0 mg/dL 22.3 19.0 20.9  Creatinine 0.7 - 1.3 mg/dL 0.9  0.8 0.9  Sodium 136 - 145 mEq/L 143 142 144  Potassium 3.5 - 5.1 mEq/L 4.3 4.2 3.9  CO2 22 - 29 mEq/L 28 28 29   Calcium 8.4 - 10.4 mg/dL 9.1 9.0 9.1  Total Protein 6.4 - 8.3 g/dL 6.5 6.4 6.8  Total Bilirubin 0.20 - 1.20 mg/dL 0.38 0.45 0.32  Alkaline Phos 40 - 150 U/L 136 128 161(H)  AST 5 - 34 U/L 21 21 20   ALT 0 - 55 U/L 17 14 19    Results for TEION, BALLIN (MRN [id number]) as of 08/12/2015 19:54  Ref. Range 03/25/2015 08:20 04/15/2015 08:32 05/14/2015 12:23 06/24/2015 11:23 07/22/2015 10:13  CEA Latest Units: ng/mL 5.3 (H) 3.7 2.9 1.7 2.0  CEA Latest Ref Range: 0.0-4.7 ng/mL    3.0 3.8   (CEA was parallel tested in 2 different labs)   Pathology report  Diagnosis  06/07/2014 PERITONEAL/ASCITIC FLUID(SPECIMEN 1 OF 1 COLLECTED 04/26/14): METASTATIC  ADENOCARCINOMA WITH SIGNET RING CELL FEATURES.  Stomach, biopsy, r/o cancer 04/25/14 - ADENOCARCINOMA WITH SIGNET RING FEATURES. Microscopic Comment The results were called to Dr. Ardis Hughs on 04/26/2014. (JDP:kh 04/26/14) Claudette Laws MD Pathologist, Electronic Signature (Case signed 04/26/2014)  HER-2/NEU BY CISH - NO AMPLIFICATION OF HER-2 DETECTED. RESULT RATIO OF HER2: CEP 17 SIGNALS 1.20 AVERAGE HER2 COPY NUMBER PER CELL 2.10  ADDITIONAL INFORMATION: Mismatch Repair (MMR) Protein Immunohistochemistry (IHC) IHC Expression Result: MLH1: Preserved nuclear expression (greater 50% tumor expression) MSH2: Preserved nuclear expression (greater 50% tumor expression) MSH6: Preserved nuclear expression (greater 50% tumor expression) PMS2: Preserved nuclear expression (greater 50% tumor expression) * Internal control demonstrates intact nuclear expression Interpretation: NORMAL There is preserved expression of the major and minor MMR proteins. There is a very low probability that microsatellite instability (MSI) is present. However, certain clinically significant MMR protein mutations may result in preservation of nuclear expression. It is  recommended that the preservation of protein expression be correlated with molecular based MSI testing.  [facility] [place] testing: (+) YI50 and NF1   RADIOGRAPHIC STUDIES: I have personally reviewed the radiological images as listed and agreed with the findings in the report  CT chest, abdomen and pelvis with contrast, 07/31/2015 at [facility] IMPRESSION: 1. On physical metastasis has increased in size  2. No significant change in gastric mass, peritoneal carcinomatosis, lung metastasis. 3.  stable splenomegaly.   ASSESSMENT & PLAN:  40 year old gentleman without significant past medical history presents with abdominal bloating, nausea, vomiting, diarrhea, anemia and weight loss. EGD showed a large mass arising from the gastric fundus and extending into GE junction, biopsy showed adenocarcinoma with signet ring features, CT scan is consistent with peritoneal carcinomatosis and bilateral lung metastasis. Ascites cytology was positive for malignant cells.  1. 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 HER-2 negative, MMR-normal -I discussed his surgical biopsy results, cytology results, scan findings with patient and his family members, including his parents and 2 sisters. Unfortunately, this is widely metastatic, not curable, and overall prognosis is very poor.  -His tumor is negative for HER-2 overexpression, no role for trastuzumab. -he had excellent response to first-line chemotherapy FOLFOX, currently on second line FOLFIRI due to disease progression. -I reviewed his recently restaging CT from 07/31/2015 which was done at [facility]. It reported stable gastric mass, peritoneal carcinomatosis and lung metastasis. It raised a question that the umbilical metastasis has increased in size. I reviewed the image with our radiologist in [facility], the umbilical metastasis appears to be a hernia, increase of the size could be related to  the increase in size of hernia. The overall impression is stable peritoneal carcinomatosis. -I agree with [facility]'s recommendation to continue his second line FOLFIRI, no need to change his chemo regimen since there is no significant disease progression and pt is clinically doing well  -His CEA is coming down again, clinically doing better,  recurrent ascites has most resolved, which is consistent with his good response to chemo. -surgical debulking and HIPEC was discussed by his oncologist Dr. Liane Comber and surgeon at [facility] in 07/2015 and it's on hold for now.  -continue chemo, he is scheduled to return to [facility] in June   2. Recurrent malignant ascites -s/p paracentesis at the beginning of his diagnosis, and again on 03/10/15 and 03/24/15, and 11/23, 12/6 and 1/6.  His ascites has near resolved again. -We'll  Continue monitoring  3. Anemia, multifactorial (iron deficient anemia, cancer  related and chemotherapy induced ) -His ferritin level was normal at 171, but he has low iron and saturation, likely some degree of iron deficient anemia. -He has received IV Feraheme 510 mg twice, 2 weeks apart -Blood transfusion as needed during chemotherapy if her hemoglobin below 8. -Mild anemia has been stable.  4. 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  I spent 20 minutes counseling the patient face to face. The total time spent in the appointment was 30  minutes and more than 50% was on counseling    Truitt Merle, MD 08/26/2015

## 2015-08-26 NOTE — Patient Instructions (Signed)
[facility] Discharge Instructions for Patients Receiving Chemotherapy  Today you finished the following chemotherapy agents: 5FU To help prevent nausea and vomiting after your treatment, we encourage you to take your nausea medication {as prescribed. If you develop nausea and vomiting that is not controlled by your nausea medication, call the clinic.   BELOW ARE SYMPTOMS THAT SHOULD BE REPORTED IMMEDIATELY:  *FEVER GREATER THAN 100.5 F  *CHILLS WITH OR WITHOUT FEVER  NAUSEA AND VOMITING THAT IS NOT CONTROLLED WITH YOUR NAUSEA MEDICATION  *UNUSUAL SHORTNESS OF BREATH  *UNUSUAL BRUISING OR BLEEDING  TENDERNESS IN MOUTH AND THROAT WITH OR WITHOUT PRESENCE OF ULCERS  *URINARY PROBLEMS  *BOWEL PROBLEMS  UNUSUAL RASH Items with * indicate a potential emergency and should be followed up as soon as possible.  Feel free to call the clinic you have any questions or concerns. The clinic phone number is [phone].  Please show the [place] at check-in to the Emergency Department and triage nurse.

## 2015-08-27 LAB — CEA: CEA: 7.3 ng/mL — ABNORMAL HIGH (ref 0.0–4.7)

## 2015-08-28 ENCOUNTER — Ambulatory Visit (HOSPITAL_BASED_OUTPATIENT_CLINIC_OR_DEPARTMENT_OTHER): Payer: BLUE CROSS/BLUE SHIELD

## 2015-08-28 ENCOUNTER — Ambulatory Visit: Payer: BLUE CROSS/BLUE SHIELD

## 2015-08-28 VITALS — BP 115/69 | HR 54 | Temp 97.9°F | Resp 18

## 2015-08-28 DIAGNOSIS — C169 Malignant neoplasm of stomach, unspecified: Secondary | ICD-10-CM

## 2015-08-28 DIAGNOSIS — C786 Secondary malignant neoplasm of retroperitoneum and peritoneum: Secondary | ICD-10-CM

## 2015-08-28 DIAGNOSIS — C7800 Secondary malignant neoplasm of unspecified lung: Secondary | ICD-10-CM

## 2015-08-28 DIAGNOSIS — Z5189 Encounter for other specified aftercare: Secondary | ICD-10-CM | POA: Diagnosis not present

## 2015-08-28 MED ORDER — PEGFILGRASTIM INJECTION 6 MG/0.6ML ~~LOC~~
6.0000 mg | PREFILLED_SYRINGE | Freq: Once | SUBCUTANEOUS | Status: AC
  Administered 2015-08-28: 6 mg via SUBCUTANEOUS
  Filled 2015-08-28: qty 0.6

## 2015-08-28 MED ORDER — SODIUM CHLORIDE 0.9 % IJ SOLN
10.0000 mL | INTRAMUSCULAR | Status: DC | PRN
  Administered 2015-08-28: 10 mL
  Filled 2015-08-28: qty 10

## 2015-08-28 MED ORDER — HEPARIN SOD (PORK) LOCK FLUSH 100 UNIT/ML IV SOLN
500.0000 [IU] | Freq: Once | INTRAVENOUS | Status: AC | PRN
  Administered 2015-08-28: 500 [IU]
  Filled 2015-08-28: qty 5

## 2015-08-28 NOTE — Patient Instructions (Signed)
[facility] Discharge Instructions for Patients Receiving Chemotherapy  Today you finished the following chemotherapy agents: 5FU To help prevent nausea and vomiting after your treatment, we encourage you to take your nausea medication {as prescribed. If you develop nausea and vomiting that is not controlled by your nausea medication, call the clinic.   BELOW ARE SYMPTOMS THAT SHOULD BE REPORTED IMMEDIATELY:  *FEVER GREATER THAN 100.5 F  *CHILLS WITH OR WITHOUT FEVER  NAUSEA AND VOMITING THAT IS NOT CONTROLLED WITH YOUR NAUSEA MEDICATION  *UNUSUAL SHORTNESS OF BREATH  *UNUSUAL BRUISING OR BLEEDING  TENDERNESS IN MOUTH AND THROAT WITH OR WITHOUT PRESENCE OF ULCERS  *URINARY PROBLEMS  *BOWEL PROBLEMS  UNUSUAL RASH Items with * indicate a potential emergency and should be followed up as soon as possible.  Feel free to call the clinic you have any questions or concerns. The clinic phone number is [phone].  Please show the [place] at check-in to the Emergency Department and triage nurse.

## 2015-08-28 NOTE — Progress Notes (Signed)
Neulasta injection given by infusion nurse after home infusion pump disconnected 

## 2015-09-09 ENCOUNTER — Encounter: Payer: Self-pay | Admitting: Hematology

## 2015-09-09 ENCOUNTER — Ambulatory Visit (HOSPITAL_BASED_OUTPATIENT_CLINIC_OR_DEPARTMENT_OTHER): Payer: BLUE CROSS/BLUE SHIELD

## 2015-09-09 ENCOUNTER — Other Ambulatory Visit (HOSPITAL_BASED_OUTPATIENT_CLINIC_OR_DEPARTMENT_OTHER): Payer: BLUE CROSS/BLUE SHIELD

## 2015-09-09 ENCOUNTER — Ambulatory Visit: Payer: BLUE CROSS/BLUE SHIELD

## 2015-09-09 ENCOUNTER — Ambulatory Visit (HOSPITAL_BASED_OUTPATIENT_CLINIC_OR_DEPARTMENT_OTHER): Payer: BLUE CROSS/BLUE SHIELD | Admitting: Hematology

## 2015-09-09 VITALS — BP 120/74 | HR 64 | Temp 97.5°F | Resp 20 | Ht 66.0 in | Wt 302.1 lb

## 2015-09-09 DIAGNOSIS — C786 Secondary malignant neoplasm of retroperitoneum and peritoneum: Secondary | ICD-10-CM

## 2015-09-09 DIAGNOSIS — C7800 Secondary malignant neoplasm of unspecified lung: Secondary | ICD-10-CM

## 2015-09-09 DIAGNOSIS — C801 Malignant (primary) neoplasm, unspecified: Secondary | ICD-10-CM

## 2015-09-09 DIAGNOSIS — D509 Iron deficiency anemia, unspecified: Secondary | ICD-10-CM

## 2015-09-09 DIAGNOSIS — D6481 Anemia due to antineoplastic chemotherapy: Secondary | ICD-10-CM

## 2015-09-09 DIAGNOSIS — C169 Malignant neoplasm of stomach, unspecified: Secondary | ICD-10-CM

## 2015-09-09 DIAGNOSIS — D630 Anemia in neoplastic disease: Secondary | ICD-10-CM

## 2015-09-09 DIAGNOSIS — R180 Malignant ascites: Secondary | ICD-10-CM

## 2015-09-09 DIAGNOSIS — Z5111 Encounter for antineoplastic chemotherapy: Secondary | ICD-10-CM | POA: Diagnosis not present

## 2015-09-09 LAB — CBC WITH DIFFERENTIAL/PLATELET
BASO%: 0.3 % (ref 0.0–2.0)
BASOS ABS: 0 10*3/uL (ref 0.0–0.1)
EOS%: 1.2 % (ref 0.0–7.0)
Eosinophils Absolute: 0.1 10*3/uL (ref 0.0–0.5)
HCT: 36 % — ABNORMAL LOW (ref 38.4–49.9)
HGB: 11.7 g/dL — ABNORMAL LOW (ref 13.0–17.1)
LYMPH%: 20 % (ref 14.0–49.0)
MCH: 28.9 pg (ref 27.2–33.4)
MCHC: 32.5 g/dL (ref 32.0–36.0)
MCV: 88.9 fL (ref 79.3–98.0)
MONO#: 0.8 10*3/uL (ref 0.1–0.9)
MONO%: 14.2 % — AB (ref 0.0–14.0)
NEUT#: 3.7 10*3/uL (ref 1.5–6.5)
NEUT%: 64.3 % (ref 39.0–75.0)
Platelets: 147 10*3/uL (ref 140–400)
RBC: 4.05 10*6/uL — AB (ref 4.20–5.82)
RDW: 15.8 % — ABNORMAL HIGH (ref 11.0–14.6)
WBC: 5.8 10*3/uL (ref 4.0–10.3)
lymph#: 1.2 10*3/uL (ref 0.9–3.3)

## 2015-09-09 LAB — COMPREHENSIVE METABOLIC PANEL
ALT: 18 U/L (ref 0–55)
AST: 18 U/L (ref 5–34)
Albumin: 3.2 g/dL — ABNORMAL LOW (ref 3.5–5.0)
Alkaline Phosphatase: 121 U/L (ref 40–150)
Anion Gap: 7 mEq/L (ref 3–11)
BUN: 15.7 mg/dL (ref 7.0–26.0)
CHLORIDE: 108 meq/L (ref 98–109)
CO2: 27 meq/L (ref 22–29)
CREATININE: 0.9 mg/dL (ref 0.7–1.3)
Calcium: 8.9 mg/dL (ref 8.4–10.4)
EGFR: >90 mL/min/{1.73_m2} (ref >90)
Glucose: 90 mg/dl (ref 70–140)
POTASSIUM: 4 meq/L (ref 3.5–5.1)
Sodium: 142 mEq/L (ref 136–145)
Total Bilirubin: 0.34 mg/dL (ref 0.20–1.20)
Total Protein: 6.2 g/dL — ABNORMAL LOW (ref 6.4–8.3)

## 2015-09-09 MED ORDER — PROCHLORPERAZINE MALEATE 10 MG PO TABS
ORAL_TABLET | ORAL | Status: AC
  Filled 2015-09-09: qty 1

## 2015-09-09 MED ORDER — ATROPINE SULFATE 1 MG/ML IJ SOLN
INTRAMUSCULAR | Status: AC
  Filled 2015-09-09: qty 1

## 2015-09-09 MED ORDER — ATROPINE SULFATE 1 MG/ML IJ SOLN
0.5000 mg | Freq: Once | INTRAMUSCULAR | Status: AC | PRN
  Administered 2015-09-09: 11:00:00 via INTRAVENOUS

## 2015-09-09 MED ORDER — DEXTROSE 5 % IV SOLN
180.0000 mg/m2 | Freq: Once | INTRAVENOUS | Status: AC
  Administered 2015-09-09: 448 mg via INTRAVENOUS
  Filled 2015-09-09: qty 7

## 2015-09-09 MED ORDER — LEUCOVORIN CALCIUM INJECTION 350 MG
400.0000 mg/m2 | Freq: Once | INTRAVENOUS | Status: AC
  Administered 2015-09-09: 996 mg via INTRAVENOUS
  Filled 2015-09-09: qty 49.8

## 2015-09-09 MED ORDER — SODIUM CHLORIDE 0.9 % IV SOLN
2200.0000 mg/m2 | INTRAVENOUS | Status: DC
  Administered 2015-09-09: 5500 mg via INTRAVENOUS
  Filled 2015-09-09: qty 110

## 2015-09-09 MED ORDER — PROCHLORPERAZINE MALEATE 10 MG PO TABS
10.0000 mg | ORAL_TABLET | Freq: Once | ORAL | Status: AC
  Administered 2015-09-09: 10 mg via ORAL

## 2015-09-09 MED ORDER — SODIUM CHLORIDE 0.9 % IJ SOLN
10.0000 mL | INTRAMUSCULAR | Status: DC | PRN
  Administered 2015-09-09: 10 mL
  Filled 2015-09-09: qty 10

## 2015-09-09 MED ORDER — SODIUM CHLORIDE 0.9 % IV SOLN
Freq: Once | INTRAVENOUS | Status: AC
  Administered 2015-09-09: 11:00:00 via INTRAVENOUS
  Filled 2015-09-09: qty 8

## 2015-09-09 MED ORDER — SODIUM CHLORIDE 0.9 % IV SOLN
Freq: Once | INTRAVENOUS | Status: AC
  Administered 2015-09-09: 10:00:00 via INTRAVENOUS

## 2015-09-09 NOTE — Patient Instructions (Signed)
Leucovorin injection What is this medicine? LEUCOVORIN (loo koe VOR in) is used to prevent or treat the harmful effects of some medicines. This medicine is used to treat anemia caused by a low amount of folic acid in the body. It is also used with 5-fluorouracil (5-FU) to treat colon cancer. This medicine may be used for other purposes; ask your health care provider or pharmacist if you have questions. What should I tell my health care provider before I take this medicine? They need to know if you have any of these conditions: -anemia from low levels of vitamin B-12 in the blood -an unusual or allergic reaction to leucovorin, folic acid, other medicines, foods, dyes, or preservatives -pregnant or trying to get pregnant -breast-feeding How should I use this medicine? This medicine is for injection into a muscle or into a vein. It is given by a health care professional in a hospital or clinic setting. Talk to your pediatrician regarding the use of this medicine in children. Special care may be needed. Overdosage: If you think you have taken too much of this medicine contact a poison control center or emergency room at once. NOTE: This medicine is only for you. Do not share this medicine with others. What if I miss a dose? This does not apply. What may interact with this medicine? -capecitabine -fluorouracil -phenobarbital -phenytoin -primidone -trimethoprim-sulfamethoxazole This list may not describe all possible interactions. Give your health care provider a list of all the medicines, herbs, non-prescription drugs, or dietary supplements you use. Also tell them if you smoke, drink alcohol, or use illegal drugs. Some items may interact with your medicine. What should I watch for while using this medicine? Your condition will be monitored carefully while you are receiving this medicine. This medicine may increase the side effects of 5-fluorouracil, 5-FU. Tell your doctor or health care  professional if you have diarrhea or mouth sores that do not get better or that get worse. What side effects may I notice from receiving this medicine? Side effects that you should report to your doctor or health care professional as soon as possible: -allergic reactions like skin rash, itching or hives, swelling of the face, lips, or tongue -breathing problems -fever, infection -mouth sores -unusual bleeding or bruising -unusually weak or tired Side effects that usually do not require medical attention (report to your doctor or health care professional if they continue or are bothersome): -constipation or diarrhea -loss of appetite -nausea, vomiting This list may not describe all possible side effects. Call your doctor for medical advice about side effects. You may report side effects to FDA at 1-800-FDA-1088. Where should I keep my medicine? This drug is given in a hospital or clinic and will not be stored at home. NOTE: This sheet is a summary. It may not cover all possible information. If you have questions about this medicine, talk to your doctor, pharmacist, or health care provider.    2016, [employer]. (2007-10-31 16:50:29) Irinotecan injection What is this medicine? IRINOTECAN (ir in oh TEE kan ) is a chemotherapy drug. It is used to treat colon and rectal cancer. This medicine may be used for other purposes; ask your health care provider or pharmacist if you have questions. What should I tell my health care provider before I take this medicine? They need to know if you have any of these conditions: -blood disorders -dehydration -diarrhea -infection (especially a virus infection such as chickenpox, cold sores, or herpes) -liver disease -low blood counts, like low  white cell, platelet, or red cell counts -recent or ongoing radiation therapy -an unusual or allergic reaction to irinotecan, sorbitol, other chemotherapy, other medicines, foods, dyes, or  preservatives -pregnant or trying to get pregnant -breast-feeding How should I use this medicine? This drug is given as an infusion into a vein. It is administered in a hospital or clinic by a specially trained health care professional. Talk to your pediatrician regarding the use of this medicine in children. Special care may be needed. Overdosage: If you think you have taken too much of this medicine contact a poison control center or emergency room at once. NOTE: This medicine is only for you. Do not share this medicine with others. What if I miss a dose? It is important not to miss your dose. Call your doctor or health care professional if you are unable to keep an appointment. What may interact with this medicine? Do not take this medicine with any of the following medications: -atazanavir -certain medicines for fungal infections like itraconazole and ketoconazole -St. John's Wort This medicine may also interact with the following medications: -dexamethasone -diuretics -laxatives -medicines for seizures like carbamazepine, mephobarbital, phenobarbital, phenytoin, primidone -medicines to increase blood counts like filgrastim, pegfilgrastim, sargramostim -prochlorperazine -vaccines This list may not describe all possible interactions. Give your health care provider a list of all the medicines, herbs, non-prescription drugs, or dietary supplements you use. Also tell them if you smoke, drink alcohol, or use illegal drugs. Some items may interact with your medicine. What should I watch for while using this medicine? Your condition will be monitored carefully while you are receiving this medicine. You will need important blood work done while you are taking this medicine. This drug may make you feel generally unwell. This is not uncommon, as chemotherapy can affect healthy cells as well as cancer cells. Report any side effects. Continue your course of treatment even though you feel ill unless  your doctor tells you to stop. In some cases, you may be given additional medicines to help with side effects. Follow all directions for their use. You may get drowsy or dizzy. Do not drive, use machinery, or do anything that needs mental alertness until you know how this medicine affects you. Do not stand or sit up quickly, especially if you are an older patient. This reduces the risk of dizzy or fainting spells. Call your doctor or health care professional for advice if you get a fever, chills or sore throat, or other symptoms of a cold or flu. Do not treat yourself. This drug decreases your body's ability to fight infections. Try to avoid being around people who are sick. This medicine may increase your risk to bruise or bleed. Call your doctor or health care professional if you notice any unusual bleeding. Be careful brushing and flossing your teeth or using a toothpick because you may get an infection or bleed more easily. If you have any dental work done, tell your dentist you are receiving this medicine. Avoid taking products that contain aspirin, acetaminophen, ibuprofen, naproxen, or ketoprofen unless instructed by your doctor. These medicines may hide a fever. Do not become pregnant while taking this medicine. Women should inform their doctor if they wish to become pregnant or think they might be pregnant. There is a potential for serious side effects to an unborn child. Talk to your health care professional or pharmacist for more information. Do not breast-feed an infant while taking this medicine. What side effects may I notice from receiving this  medicine? Side effects that you should report to your doctor or health care professional as soon as possible: -allergic reactions like skin rash, itching or hives, swelling of the face, lips, or tongue -low blood counts - this medicine may decrease the number of white blood cells, red blood cells and platelets. You may be at increased risk for  infections and bleeding. -signs of infection - fever or chills, cough, sore throat, pain or difficulty passing urine -signs of decreased platelets or bleeding - bruising, pinpoint red spots on the skin, black, tarry stools, blood in the urine -signs of decreased red blood cells - unusually weak or tired, fainting spells, lightheadedness -breathing problems -chest pain -diarrhea -feeling faint or lightheaded, falls -flushing, runny nose, sweating during infusion -mouth sores or pain -pain, swelling, redness or irritation where injected -pain, swelling, warmth in the leg -pain, tingling, numbness in the hands or feet -problems with balance, talking, walking -stomach cramps, pain -trouble passing urine or change in the amount of urine -vomiting as to be unable to hold down drinks or food -yellowing of the eyes or skin Side effects that usually do not require medical attention (report to your doctor or health care professional if they continue or are bothersome): -constipation -hair loss -headache -loss of appetite -nausea, vomiting -stomach upset This list may not describe all possible side effects. Call your doctor for medical advice about side effects. You may report side effects to FDA at 1-800-FDA-1088. Where should I keep my medicine? This drug is given in a hospital or clinic and will not be stored at home. NOTE: This sheet is a summary. It may not cover all possible information. If you have questions about this medicine, talk to your doctor, pharmacist, or health care provider.    2016, [employer]. (2012-10-23 16:29:32) Fluorouracil, 5-FU injection What is this medicine? FLUOROURACIL, 5-FU (flure oh YOOR a sil) is a chemotherapy drug. It slows the growth of cancer cells. This medicine is used to treat many types of cancer like breast cancer, colon or rectal cancer, pancreatic cancer, and stomach cancer. This medicine may be used for other purposes; ask your health  care provider or pharmacist if you have questions. What should I tell my health care provider before I take this medicine? They need to know if you have any of these conditions: -blood disorders -dihydropyrimidine dehydrogenase (DPD) deficiency -infection (especially a virus infection such as chickenpox, cold sores, or herpes) -kidney disease -liver disease -malnourished, poor nutrition -recent or ongoing radiation therapy -an unusual or allergic reaction to fluorouracil, other chemotherapy, other medicines, foods, dyes, or preservatives -pregnant or trying to get pregnant -breast-feeding How should I use this medicine? This drug is given as an infusion or injection into a vein. It is administered in a hospital or clinic by a specially trained health care professional. Talk to your pediatrician regarding the use of this medicine in children. Special care may be needed. Overdosage: If you think you have taken too much of this medicine contact a poison control center or emergency room at once. NOTE: This medicine is only for you. Do not share this medicine with others. What if I miss a dose? It is important not to miss your dose. Call your doctor or health care professional if you are unable to keep an appointment. What may interact with this medicine? -allopurinol -cimetidine -dapsone -digoxin -hydroxyurea -leucovorin -levamisole -medicines for seizures like ethotoin, fosphenytoin, phenytoin -medicines to increase blood counts like filgrastim, pegfilgrastim, sargramostim -medicines that treat or  prevent blood clots like warfarin, enoxaparin, and dalteparin -methotrexate -metronidazole -pyrimethamine -some other chemotherapy drugs like busulfan, cisplatin, estramustine, vinblastine -trimethoprim -trimetrexate -vaccines Talk to your doctor or health care professional before taking any of these medicines: -acetaminophen -aspirin -ibuprofen -ketoprofen -naproxen This list may  not describe all possible interactions. Give your health care provider a list of all the medicines, herbs, non-prescription drugs, or dietary supplements you use. Also tell them if you smoke, drink alcohol, or use illegal drugs. Some items may interact with your medicine. What should I watch for while using this medicine? Visit your doctor for checks on your progress. This drug may make you feel generally unwell. This is not uncommon, as chemotherapy can affect healthy cells as well as cancer cells. Report any side effects. Continue your course of treatment even though you feel ill unless your doctor tells you to stop. In some cases, you may be given additional medicines to help with side effects. Follow all directions for their use. Call your doctor or health care professional for advice if you get a fever, chills or sore throat, or other symptoms of a cold or flu. Do not treat yourself. This drug decreases your body's ability to fight infections. Try to avoid being around people who are sick. This medicine may increase your risk to bruise or bleed. Call your doctor or health care professional if you notice any unusual bleeding. Be careful brushing and flossing your teeth or using a toothpick because you may get an infection or bleed more easily. If you have any dental work done, tell your dentist you are receiving this medicine. Avoid taking products that contain aspirin, acetaminophen, ibuprofen, naproxen, or ketoprofen unless instructed by your doctor. These medicines may hide a fever. Do not become pregnant while taking this medicine. Women should inform their doctor if they wish to become pregnant or think they might be pregnant. There is a potential for serious side effects to an unborn child. Talk to your health care professional or pharmacist for more information. Do not breast-feed an infant while taking this medicine. Men should inform their doctor if they wish to father a child. This medicine  may lower sperm counts. Do not treat diarrhea with over the counter products. Contact your doctor if you have diarrhea that lasts more than 2 days or if it is severe and watery. This medicine can make you more sensitive to the sun. Keep out of the sun. If you cannot avoid being in the sun, wear protective clothing and use sunscreen. Do not use sun lamps or tanning beds/booths. What side effects may I notice from receiving this medicine? Side effects that you should report to your doctor or health care professional as soon as possible: -allergic reactions like skin rash, itching or hives, swelling of the face, lips, or tongue -low blood counts - this medicine may decrease the number of white blood cells, red blood cells and platelets. You may be at increased risk for infections and bleeding. -signs of infection - fever or chills, cough, sore throat, pain or difficulty passing urine -signs of decreased platelets or bleeding - bruising, pinpoint red spots on the skin, black, tarry stools, blood in the urine -signs of decreased red blood cells - unusually weak or tired, fainting spells, lightheadedness -breathing problems -changes in vision -chest pain -mouth sores -nausea and vomiting -pain, swelling, redness at site where injected -pain, tingling, numbness in the hands or feet -redness, swelling, or sores on hands or feet -stomach pain -unusual  bleeding Side effects that usually do not require medical attention (report to your doctor or health care professional if they continue or are bothersome): -changes in finger or toe nails -diarrhea -dry or itchy skin -hair loss -headache -loss of appetite -sensitivity of eyes to the light -stomach upset -unusually teary eyes This list may not describe all possible side effects. Call your doctor for medical advice about side effects. You may report side effects to FDA at 1-800-FDA-1088. Where should I keep my medicine? This drug is given in a  hospital or clinic and will not be stored at home. NOTE: This sheet is a summary. It may not cover all possible information. If you have questions about this medicine, talk to your doctor, pharmacist, or health care provider.    2016, [employer]. (2007-08-30 13:53:16)

## 2015-09-09 NOTE — Patient Instructions (Signed)

## 2015-09-09 NOTE — Progress Notes (Signed)
[place]  [phone] [phone]  Clinic follow-up Note   Patient Care Team: No Pcp Per Patient as PCP - General (General Practice) 09/09/2015  CHIEF COMPLAINTS Follow up metastatic gastric cancer    Gastric adenocarcinoma: Stage IV   04/23/2014 Imaging CT abdomen showed peritoneal carcinomatosis, ascites, thickning of gastric and GEJ wall, CT chest showed a few small b/l lung nodules.    04/24/2014 Tumor Marker CEA  5.6, CA19.9 13   04/25/2014 Initial Diagnosis Gastric adenocarcinoma: Stage IV   04/25/2014 Pathology Results Stomach mass biopsy showed - ADENOCARCINOMA WITH SIGNET RING FEATURES. ascites cytology (+)   04/25/2014 Procedure EGD: There was a large, nearly circumferential, ulcerated, clearly malignant mass that stradles the GE junction. The vast bulk of the mass lays in the stomach.    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 I saw him first when he was recently admitted to [facility]. He is here for the first office visit after hospital discharge.  He was  admitted on 12/15 with progressive, 4 month history of dysphagia, epigastric abdominal discomfort and bloating, early satiation accompanied by nausea, and postprandial vomiting and diarrhea. He has experienced a 30 lbs weight loss in the process. He has noted increased abdominal girth. Patient denies any shortness of breath, chest pain, GI bleed, hematuria or lower extremity swelling. He denies heavy alcohol intake. Denies tobacco abuse. Denies risk factors for  HIV or hepatitis. He denies any family history of GI malignancies.  CT of the abdomen and pelvis with contrast on 12/15 revealed several pulmonary nodules at the bases bilaterally, profound thickening of the distal esophagus extending beyond the gastroesophageal junction into the proximal stomach,cardia and fundus of the stomach, most evident along the lesser curvature. No pathologic dilatation of small bowel or colon. Extensive upper abdominal and retroperitoneal lymphadenopathy was visualized. Moderate volume of ascites, presumably malignant, was noted. Extensive soft tissue nodularity throughout the omentum, compatible with omental caking and widespread peritoneal metastasis. Additionally, there are other areas of enhancing nodularity along the peritoneal surface. Tiny ventral and umbilical hernias containing predominantly omental fat, including some omental implants. There are no aggressive appearing lytic or blastic lesions noted in the visualized portions of the skeleton.  He underwent an EGD by Dr. Ardis Hughs on 04/25/2014, which showed a large fungating mass that straddles the GE junction and occupying the upper 1/3 part of the stomach. Multiple biopsy was taken which showed adenocarcinoma with signet ring features. He also underwent a paracentesis with 4.5 L fluid is removed on 04/26/2014. Cytology was positive for adenocarcinoma with signet ring features, consistent with metastatic disease.  He was discharged to home on 04/27/2014. He did feel a slightly informant of his abdominal bloating after the paracentesis, but not much improvement other symptoms. He had Mediport placement and the second paracentesis was again 4 L fluids removal on 05/02/2014. His appetite is low, he drinks ensure 3 bottles a day, some soup and a very little other foods. His energy level is also low, but able to take care of all his daily needs, and  move around with about much difficulty. His abdominal discomfort/pain are  relatively controlled by morphine 15 mg at night and tramadol 2-3 times during the day. He denies any fever, chills, cough, chest pain or any in the other discomfort.  [facility] genetic test (3/66/2947): NF1 splice site [id number] del 47 TP53 R213*   CURRENT THERAPY: FOLFIRI every 2 weeks, started on 03/25/2015.  INTERIM HISTORY: [name] returns for follow up. He is clinically doing very well overall. He denies any new symptoms, no abdominal bloating, or other discomfort. He has very good appetite and energy level, he has been gaining weight lately, 7 lbs in the past 2 weeks. He has been tolerating chemo very well as usual. No other complains.   MEDICAL HISTORY:  Past Medical History  Diagnosis Date  . Medical history non-contributory   . gastric ca dx'd 04/2014    SURGICAL HISTORY: Past Surgical History  Procedure Laterality Date  . Squamous cell carcinoma excision  approx March 2015    removed from top of head  . Esophagogastroduodenoscopy (egd) with propofol N/A 04/25/2014    Procedure: ESOPHAGOGASTRODUODENOSCOPY (EGD) WITH PROPOFOL;  Surgeon: Milus Banister, MD;  Location: [facility];  Service: Endoscopy;  Laterality: N/A;    SOCIAL HISTORY: Social History   Social History  . Marital Status: Single    Spouse Name: N/A  . Number of Children: N/A  . Years of Education: N/A   Occupational History  . Not on file.   Social History Main Topics  . Smoking status: Never Smoker   . Smokeless tobacco: Never Used  . Alcohol Use: No  . Drug Use: No  . Sexual Activity: Not on file   Other Topics Concern  . Not on file   Social History Narrative   Single, lives alone with pet dog (rescue dog)   Works as Information systems manager; drives; independent   Middle child between #2 sisters-his mother and sisters are very supportive    FAMILY HISTORY: Family History  Problem Relation Age of Onset  . Breast cancer Sister 12   . Hypertension Mother   . Hypertension Father     Maternal aunt breast cancer at age of 69 Maternal cousin ovarian cancer at age of 58 Maternal ancle prostate cancer age of 24   ALLERGIES:  has No Known Allergies.  MEDICATIONS:  Current Outpatient Prescriptions  Medication Sig Dispense Refill  . docusate sodium 100 MG CAPS Take 100 mg by mouth 2 (two) times daily. 10 capsule 0  . lidocaine-prilocaine (EMLA) cream Apply 1 application topically as needed. 30 g 2  . mirtazapine (REMERON) 15 MG tablet Take 1 tablet (15 mg total) by mouth at bedtime. 30 tablet 1  . morphine (MS CONTIN) 15 MG 12 hr tablet Take 1 tablet (15 mg total) by mouth every 12 (twelve) hours. (Patient not taking: Reported on 05/26/2015) 60 tablet 0  . ondansetron (ZOFRAN) 4 MG tablet Take 1 tablet (4 mg total) by mouth every 8 (eight) hours as needed for nausea or vomiting. 20 tablet 0  . pantoprazole (PROTONIX) 40 MG tablet Take 1 tablet (40 mg total) by mouth daily. 30 tablet 0  . prochlorperazine (COMPAZINE) 10 MG tablet Take 1 tablet (10 mg total) by mouth every 6 (six) hours as needed for nausea or vomiting. (Patient not taking: Reported on 07/22/2015) 30 tablet 3  . traMADol (ULTRAM) 50 MG tablet Take 1-2 tablets (50-100 mg total) by mouth every 6 (six) hours as needed for moderate pain. (Patient  not taking: Reported on 07/22/2015) 30 tablet 0   No current facility-administered medications for this visit.   Facility-Administered Medications Ordered in Other Visits  Medication Dose Route Frequency Provider Last Rate Last Dose  . sodium chloride 0.9 % injection 10 mL  10 mL Intravenous PRN Truitt Merle, MD      . sodium chloride 0.9 % injection 10 mL  10 mL Intracatheter PRN Truitt Merle, MD   10 mL at 09/09/15 [id number]    REVIEW OF SYSTEMS:   Constitutional: Denies fevers, chills or abnormal night sweats, no recent weight loss Eyes: Denies blurriness of vision, double vision or watery eyes Ears, nose, mouth, throat, and face: Denies mucositis or sore throat Respiratory:  Denies cough, dyspnea or wheezes Cardiovascular: Denies palpitation, chest discomfort or lower extremity swelling Gastrointestinal:  Less nausea, abdominal bloating and discomfort Skin: Denies abnormal skin rashes Lymphatics: Denies new lymphadenopathy or easy bruising Neurological:Denies numbness, tingling or new weaknesses Behavioral/Psych: Mood is stable, no new changes  All other systems were reviewed with the patient and are negative except those mentioned in the history.  PHYSICAL EXAMINATION: ECOG PERFORMANCE STATUS: 1 BP 120/74 mmHg  Pulse 64  Temp(Src) 97.5 F (36.4 C) (Oral)  Resp 20  Ht _0  (1.676 m)  Wt 302 lb 1.6 oz (137.032 kg)  BMI 48.78 kg/m2  SpO2 100%  GENERAL:alert, no distress and comfortable SKIN: skin color, texture, turgor are normal, no rashes or significant lesions EYES: normal, conjunctiva are pink and non-injected, sclera clear OROPHARYNX:no exudate, no erythema and lips, buccal mucosa, and tongue normal  NECK: supple, thyroid normal size, non-tender, without nodularity LYMPH:  no palpable lymphadenopathy in the cervical, axillary or inguinal LUNGS: clear to auscultation and percussion with normal breathing effort HEART: regular rate & rhythm and no murmurs and no lower extremity edema ABDOMEN:abdomen soft, non-tender and normal bowel sounds, (+) small amount of ascites. Musculoskeletal:no cyanosis of digits and no clubbing  PSYCH: alert & oriented x 3 with fluent speech NEURO: no focal motor/sensory deficits  LABORATORY DATA:  I have reviewed the data as listed CBC Latest Ref Rng 09/09/2015 08/26/2015 08/12/2015  WBC 4.0 - 10.3 10e3/uL 5.8 5.0 6.1  Hemoglobin 13.0 - 17.1 g/dL 11.7(L) 11.9(L) 11.5(L)  Hematocrit 38.4 - 49.9 % 36.0(L) 37.2(L) 35.8(L)  Platelets 140 - 400 10e3/uL 147 126(L) 215      CMP Latest Ref Rng 09/09/2015 08/26/2015 08/12/2015  Glucose 70 - 140 mg/dl 90 88 94  BUN 7.0 - 26.0 mg/dL 15.7 22.3 19.0  Creatinine 0.7 - 1.3 mg/dL 0.9 0.9  0.8  Sodium 136 - 145 mEq/L 142 143 142  Potassium 3.5 - 5.1 mEq/L 4.0 4.3 4.2  CO2 22 - 29 mEq/L _1 Calcium 8.4 - 10.4 mg/dL 8.9 9.1 9.0  Total Protein 6.4 - 8.3 g/dL 6.2(L) 6.5 6.4  Total Bilirubin 0.20 - 1.20 mg/dL 0.34 0.38 0.45  Alkaline Phos 40 - 150 U/L 121 136 128  AST 5 - 34 U/L _2 ALT 0 - 55 U/L _3 Results for VERDELL, DYKMAN (MRN [id number]) as of 09/09/2015 07:13  Ref. Range 06/24/2015 11:23 07/22/2015 10:13 08/26/2015 09:35  CEA Latest Units: ng/mL 1.7 2.0   CEA Latest Ref Range: 0.0-4.7 ng/mL 3.0 3.8 7.3 (H)   (CEA was parallel tested in 2 different labs)   Pathology report  Diagnosis  06/07/2014 PERITONEAL/ASCITIC FLUID(SPECIMEN 1 OF 1 COLLECTED 04/26/14): METASTATIC ADENOCARCINOMA WITH SIGNET RING CELL FEATURES.  Stomach, biopsy,  r/o cancer 04/25/14 - ADENOCARCINOMA WITH SIGNET RING FEATURES. Microscopic Comment The results were called to Dr. Ardis Hughs on 04/26/2014. (JDP:kh 04/26/14) Claudette Laws MD Pathologist, Electronic Signature (Case signed 04/26/2014)  HER-2/NEU BY CISH - NO AMPLIFICATION OF HER-2 DETECTED. RESULT RATIO OF HER2: CEP 17 SIGNALS 1.20 AVERAGE HER2 COPY NUMBER PER CELL 2.10  ADDITIONAL INFORMATION: Mismatch Repair (MMR) Protein Immunohistochemistry (IHC) IHC Expression Result: MLH1: Preserved nuclear expression (greater 50% tumor expression) MSH2: Preserved nuclear expression (greater 50% tumor expression) MSH6: Preserved nuclear expression (greater 50% tumor expression) PMS2: Preserved nuclear expression (greater 50% tumor expression) * Internal control demonstrates intact nuclear expression Interpretation: NORMAL There is preserved expression of the major and minor MMR proteins. There is a very low probability that microsatellite instability (MSI) is present. However, certain clinically significant MMR protein mutations may result in preservation of nuclear expression. It is recommended that the preservation of protein  expression be correlated with molecular based MSI testing.  [facility] [place] testing: (+) XB14 and NF1   RADIOGRAPHIC STUDIES: I have personally reviewed the radiological images as listed and agreed with the findings in the report  CT chest, abdomen and pelvis with contrast, 07/31/2015 at [facility] IMPRESSION: 1. On physical metastasis has increased in size  2. No significant change in gastric mass, peritoneal carcinomatosis, lung metastasis. 3.  stable splenomegaly.   ASSESSMENT & PLAN:  40 year old gentleman without significant past medical history presents with abdominal bloating, nausea, vomiting, diarrhea, anemia and weight loss. EGD showed a large mass arising from the gastric fundus and extending into GE junction, biopsy showed adenocarcinoma with signet ring features, CT scan is consistent with peritoneal carcinomatosis and bilateral lung metastasis. Ascites cytology was positive for malignant cells.  1. 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 HER-2 negative, MMR-normal -I discussed his surgical biopsy results, cytology results, scan findings with patient and his family members, including his parents and 2 sisters. Unfortunately, this is widely metastatic, not curable, and overall prognosis is very poor.  -His tumor is negative for HER-2 overexpression, no role for trastuzumab. -he had excellent response to first-line chemotherapy FOLFOX, currently on second line FOLFIRI due to disease progression. -I reviewed his recently restaging CT from 07/31/2015 which was done at [facility]. It reported stable gastric mass, peritoneal carcinomatosis and lung metastasis. It raised a question that the umbilical metastasis has increased in size. I reviewed the image with our radiologist in [facility], the umbilical metastasis appears to be a hernia, increase of the size could be related to the increase in size of hernia. The overall  impression is stable peritoneal carcinomatosis. -I agree with [facility]'s recommendation to continue his second line FOLFIRI, no need to change his chemo regimen since there is no significant disease progression and pt is clinically doing well  -surgical debulking and HIPEC was discussed by his oncologist Dr. Liane Comber and surgeon at [facility] in 07/2015 and it's on hold for now.  -His CEA has slightly increased lately, he is gaining weight, concerning for recurrent malignant ascites, although he is not symptomatic. -continue chemo, he is scheduled to return to [facility] in early June  -I encouraged him to consider clinical trial at [facility], if his next scan shows disease progression. We do not have many other good standard treatment options after the second line chemotherapy for his disease.   2. Recurrent malignant ascites -s/p paracentesis at the beginning of his diagnosis, and again on 03/10/15 and 03/24/15,  and 11/23, 12/6 and 1/6.   -We'll  Continue monitoring  3. Anemia, multifactorial (iron deficient anemia, cancer related and chemotherapy induced ) -His ferritin level was normal at 171, but he has low iron and saturation, likely some degree of iron deficient anemia. -He has received IV Feraheme 510 mg twice, 2 weeks apart -Blood transfusion as needed during chemotherapy if her hemoglobin below 8. -Mild anemia has been stable.  4. Morbid obesity  -He has great appetite, eats very well, he knows to watch his diet. He has gained weight quite bit daily.   Plan -continue chemotherapy FOLFIRI today and every 2 weeks with Neulasta on day 3 - I'll see him back in 2 weeks -Close follow-up his ascites, he may need repeat ultrasound and paracentesis in the next month  The patient knows to call the clinic with any problems, questions or concerns.  I spent 20 minutes counseling the patient face to face. The total time spent in the appointment was 30  minutes and more than 50% was  on counseling    Truitt Merle, MD 09/09/2015

## 2015-09-10 ENCOUNTER — Encounter (HOSPITAL_COMMUNITY): Payer: Self-pay | Admitting: Emergency Medicine

## 2015-09-10 ENCOUNTER — Emergency Department (HOSPITAL_COMMUNITY)
Admission: EM | Admit: 2015-09-10 | Discharge: 2015-09-10 | Disposition: A | Discharge status: Home / Self Care | Payer: BLUE CROSS/BLUE SHIELD | Attending: Emergency Medicine | Admitting: Emergency Medicine

## 2015-09-10 ENCOUNTER — Encounter: Payer: Self-pay | Admitting: Hematology

## 2015-09-10 DIAGNOSIS — Z85028 Personal history of other malignant neoplasm of stomach: Secondary | ICD-10-CM | POA: Diagnosis not present

## 2015-09-10 DIAGNOSIS — T82898A Other specified complication of vascular prosthetic devices, implants and grafts, initial encounter: Secondary | ICD-10-CM | POA: Diagnosis not present

## 2015-09-10 DIAGNOSIS — Y658 Other specified misadventures during surgical and medical care: Secondary | ICD-10-CM | POA: Diagnosis not present

## 2015-09-10 DIAGNOSIS — Z79899 Other long term (current) drug therapy: Secondary | ICD-10-CM | POA: Diagnosis not present

## 2015-09-10 DIAGNOSIS — T829XXA Unspecified complication of cardiac and vascular prosthetic device, implant and graft, initial encounter: Secondary | ICD-10-CM

## 2015-09-10 NOTE — Discharge Instructions (Signed)
You may use your port.   See your oncologist  Return to ER if the port malfunctions, chest pain, abdominal pain, fevers

## 2015-09-10 NOTE — ED Notes (Signed)
Pt states CADD Legacy plus pump has been alarming x 1 hour "high pressure". Company called for support [phone] pump [id number], Dr. Burr Medico. Pt is due to be removed tomorrow at 11am

## 2015-09-10 NOTE — Progress Notes (Signed)
Nurse bought forms to me. The patient gave to her in the hall--she knows now of the form must be filled out by patient. I will call him once ready

## 2015-09-10 NOTE — ED Provider Notes (Signed)
CSN: [id number]     Arrival date & time 09/10/15  2058 History   First MD Initiated Contact with Patient 09/10/15 2223     Chief Complaint  Patient presents with  . CADD pump issue      (Consider location/radiation/quality/duration/timing/severity/associated sxs/prior Treatment) The history is provided by the patient.  Kendarious [place] is a 40 y.o. male hx of gastric cancer current on IV chemo here with equipment malfunction. Patient states that he has the IV chemo through a pump for the last 2 days. Around 8:30 pm, his pump read "high pressure". He tried to stop it but the warning wouldn't go away. Denies port infiltrating or chest pain. Arrive in the ED and the nurses able to flush the port and now the pump functions well.    Past Medical History  Diagnosis Date  . Medical history non-contributory   . gastric ca dx'd 04/2014   Past Surgical History  Procedure Laterality Date  . Squamous cell carcinoma excision  approx March 2015    removed from top of head  . Esophagogastroduodenoscopy (egd) with propofol N/A 04/25/2014    Procedure: ESOPHAGOGASTRODUODENOSCOPY (EGD) WITH PROPOFOL;  Surgeon: Milus Banister, MD;  Location: [facility];  Service: Endoscopy;  Laterality: N/A;   Family History  Problem Relation Age of Onset  . Breast cancer Sister   . Hypertension Mother   . Hypertension Father    Social History  Substance Use Topics  . Smoking status: Never Smoker   . Smokeless tobacco: Never Used  . Alcohol Use: No    Review of Systems  All other systems reviewed and are negative.     Allergies  Review of patient's allergies indicates no known allergies.  Home Medications   Prior to Admission medications   Medication Sig Start Date End Date Taking? Authorizing Provider  docusate sodium 100 MG CAPS Take 100 mg by mouth 2 (two) times daily. 04/27/14   Eugenie Filler, MD  lidocaine-prilocaine (EMLA) cream Apply 1 application topically as needed. 01/28/15   Truitt Merle, MD   mirtazapine (REMERON) 15 MG tablet Take 1 tablet (15 mg total) by mouth at bedtime. 05/06/14   Truitt Merle, MD  morphine (MS CONTIN) 15 MG 12 hr tablet Take 1 tablet (15 mg total) by mouth every 12 (twelve) hours. Patient not taking: Reported on 05/26/2015 04/27/14   Eugenie Filler, MD  ondansetron (ZOFRAN) 4 MG tablet Take 1 tablet (4 mg total) by mouth every 8 (eight) hours as needed for nausea or vomiting. 04/27/14   Eugenie Filler, MD  pantoprazole (PROTONIX) 40 MG tablet Take 1 tablet (40 mg total) by mouth daily. 04/27/14   Eugenie Filler, MD  prochlorperazine (COMPAZINE) 10 MG tablet Take 1 tablet (10 mg total) by mouth every 6 (six) hours as needed for nausea or vomiting. Patient not taking: Reported on 07/22/2015 05/06/14   Truitt Merle, MD  traMADol (ULTRAM) 50 MG tablet Take 1-2 tablets (50-100 mg total) by mouth every 6 (six) hours as needed for moderate pain. Patient not taking: Reported on 07/22/2015 04/27/14   Eugenie Filler, MD   BP 115/66 mmHg  Pulse 60  Temp(Src) 97.9 F (36.6 C) (Oral)  Resp 20  Ht 5\' 9"  (1.753 m)  Wt 302 lb (136.986 kg)  BMI 44.58 kg/m2  SpO2 97% Physical Exam  Constitutional: He is oriented to person, place, and time. He appears well-developed.  Chronically ill, NAD   HENT:  Head: Normocephalic.  Mouth/Throat: Oropharynx is  clear and moist.  Eyes: Conjunctivae are normal. Pupils are equal, round, and reactive to light.  Neck: Normal range of motion.  Cardiovascular: Normal rate.   Pulmonary/Chest: Effort normal and breath sounds normal.  R port with chemo infusing well, no pump issues currently   Abdominal: Soft. Bowel sounds are normal. He exhibits no distension. There is no tenderness. There is no rebound.  Musculoskeletal: Normal range of motion.  Neurological: He is alert and oriented to person, place, and time.  Skin: Skin is warm.  Psychiatric: He has a normal mood and affect. His behavior is normal. Judgment and thought content  normal.  Nursing note and vitals reviewed.   ED Course  Procedures (including critical care time) Labs Review Labs Reviewed - No data to display  Imaging Review No results found. I have personally reviewed and evaluated these images and lab results as part of my medical decision-making.   EKG Interpretation None      MDM   Final diagnoses:  Vascular port complication, initial encounter    Ahsan Salom is a 40 y.o. male here with port complication. Port was flushed by nursing now chemo infusing well. Port site doesn't appear infected or infiltrated. Will dc home.    Wandra Arthurs, MD 09/10/15 2234

## 2015-09-11 ENCOUNTER — Ambulatory Visit (HOSPITAL_BASED_OUTPATIENT_CLINIC_OR_DEPARTMENT_OTHER): Payer: BLUE CROSS/BLUE SHIELD

## 2015-09-11 ENCOUNTER — Encounter: Payer: Self-pay | Admitting: Hematology

## 2015-09-11 ENCOUNTER — Ambulatory Visit: Payer: BLUE CROSS/BLUE SHIELD

## 2015-09-11 VITALS — BP 114/63 | HR 58 | Temp 97.6°F | Resp 18

## 2015-09-11 DIAGNOSIS — Z5189 Encounter for other specified aftercare: Secondary | ICD-10-CM | POA: Diagnosis not present

## 2015-09-11 DIAGNOSIS — C786 Secondary malignant neoplasm of retroperitoneum and peritoneum: Secondary | ICD-10-CM

## 2015-09-11 DIAGNOSIS — C7800 Secondary malignant neoplasm of unspecified lung: Secondary | ICD-10-CM | POA: Diagnosis not present

## 2015-09-11 DIAGNOSIS — C169 Malignant neoplasm of stomach, unspecified: Secondary | ICD-10-CM

## 2015-09-11 MED ORDER — PEGFILGRASTIM INJECTION 6 MG/0.6ML ~~LOC~~
6.0000 mg | PREFILLED_SYRINGE | Freq: Once | SUBCUTANEOUS | Status: AC
  Administered 2015-09-11: 6 mg via SUBCUTANEOUS
  Filled 2015-09-11: qty 0.6

## 2015-09-11 MED ORDER — HEPARIN SOD (PORK) LOCK FLUSH 100 UNIT/ML IV SOLN
500.0000 [IU] | Freq: Once | INTRAVENOUS | Status: AC | PRN
  Administered 2015-09-11: 500 [IU]
  Filled 2015-09-11: qty 5

## 2015-09-11 MED ORDER — SODIUM CHLORIDE 0.9 % IJ SOLN
10.0000 mL | INTRAMUSCULAR | Status: DC | PRN
  Administered 2015-09-11: 10 mL
  Filled 2015-09-11: qty 10

## 2015-09-11 NOTE — Progress Notes (Signed)
nurse bought to [place] for dr to sign-call patient when ready to pick up

## 2015-09-11 NOTE — Patient Instructions (Signed)
Pegfilgrastim injection What is this medicine? PEGFILGRASTIM (PEG fil gra stim) is a long-acting granulocyte colony-stimulating factor that stimulates the growth of neutrophils, a type of white blood cell important in the body's fight against infection. It is used to reduce the incidence of fever and infection in patients with certain types of cancer who are receiving chemotherapy that affects the bone marrow, and to increase survival after being exposed to high doses of radiation. This medicine may be used for other purposes; ask your health care provider or pharmacist if you have questions. What should I tell my health care provider before I take this medicine? They need to know if you have any of these conditions: -kidney disease -latex allergy -ongoing radiation therapy -sickle cell disease -skin reactions to acrylic adhesives (On-Body Injector only) -an unusual or allergic reaction to pegfilgrastim, filgrastim, other medicines, foods, dyes, or preservatives -pregnant or trying to get pregnant -breast-feeding How should I use this medicine? This medicine is for injection under the skin. If you get this medicine at home, you will be taught how to prepare and give the pre-filled syringe or how to use the On-body Injector. Refer to the patient Instructions for Use for detailed instructions. Use exactly as directed. Take your medicine at regular intervals. Do not take your medicine more often than directed. It is important that you put your used needles and syringes in a special sharps container. Do not put them in a trash can. If you do not have a sharps container, call your pharmacist or healthcare provider to get one. Talk to your pediatrician regarding the use of this medicine in children. While this drug may be prescribed for selected conditions, precautions do apply. Overdosage: If you think you have taken too much of this medicine contact a poison control center or emergency room at  once. NOTE: This medicine is only for you. Do not share this medicine with others. What if I miss a dose? It is important not to miss your dose. Call your doctor or health care professional if you miss your dose. If you miss a dose due to an On-body Injector failure or leakage, a new dose should be administered as soon as possible using a single prefilled syringe for manual use. What may interact with this medicine? Interactions have not been studied. Give your health care provider a list of all the medicines, herbs, non-prescription drugs, or dietary supplements you use. Also tell them if you smoke, drink alcohol, or use illegal drugs. Some items may interact with your medicine. This list may not describe all possible interactions. Give your health care provider a list of all the medicines, herbs, non-prescription drugs, or dietary supplements you use. Also tell them if you smoke, drink alcohol, or use illegal drugs. Some items may interact with your medicine. What should I watch for while using this medicine? You may need blood work done while you are taking this medicine. If you are going to need a MRI, CT scan, or other procedure, tell your doctor that you are using this medicine (On-Body Injector only). What side effects may I notice from receiving this medicine? Side effects that you should report to your doctor or health care professional as soon as possible: -allergic reactions like skin rash, itching or hives, swelling of the face, lips, or tongue -dizziness -fever -pain, redness, or irritation at site where injected -pinpoint red spots on the skin -red or dark-brown urine -shortness of breath or breathing problems -stomach or side pain, or pain   at the shoulder -swelling -tiredness -trouble passing urine or change in the amount of urine Side effects that usually do not require medical attention (report to your doctor or health care professional if they continue or are  bothersome): -bone pain -muscle pain This list may not describe all possible side effects. Call your doctor for medical advice about side effects. You may report side effects to FDA at 1-800-FDA-1088. Where should I keep my medicine? Keep out of the reach of children. Store pre-filled syringes in a refrigerator between 2 and 8 degrees C (36 and 46 degrees F). Do not freeze. Keep in carton to protect from light. Throw away this medicine if it is left out of the refrigerator for more than 48 hours. Throw away any unused medicine after the expiration date. NOTE: This sheet is a summary. It may not cover all possible information. If you have questions about this medicine, talk to your doctor, pharmacist, or health care provider.    2016, Elsevier/Gold Standard. (2014-05-16 14:30:14)  Port site instructions: keep site clean, apply topical over-the-counter antibiotic ointment as needed. Call [facility] with any changes, such as increasing redness, pain, discharge from site, or fever.

## 2015-09-11 NOTE — Progress Notes (Signed)
PAC site pink and non-tender without drainage. Patient reports that he had to go to the ED last night when pump alarmed "high pressure" and he could not trouble shoot from home. He called phone number on the pump and they recommended to go to the ED. He states ED staff unable to flush, so he was de-accessed and re-accessed. Pump infused remainder of 5FU without difficulty and bag is empty at this time. Dr. Burr Medico assessed [facility] site at chairside and recommends to keep site clean, apply topical over-the-counter antibiotic ointment as needed. Instructed to call [facility] with any changes, such as increasing redness, pain, discharge from site, or fever. Patient verbalizes understanding.

## 2015-09-12 ENCOUNTER — Encounter: Payer: Self-pay | Admitting: Hematology

## 2015-09-12 NOTE — Progress Notes (Signed)
nurse bought to [place] for dr to sign-call patient when ready to pick up- let patient know forms are ready for pick up

## 2015-09-23 ENCOUNTER — Ambulatory Visit (HOSPITAL_BASED_OUTPATIENT_CLINIC_OR_DEPARTMENT_OTHER): Payer: BLUE CROSS/BLUE SHIELD | Admitting: Hematology

## 2015-09-23 ENCOUNTER — Encounter: Payer: Self-pay | Admitting: *Deleted

## 2015-09-23 ENCOUNTER — Telehealth: Payer: Self-pay | Admitting: Hematology

## 2015-09-23 ENCOUNTER — Telehealth: Payer: Self-pay | Admitting: *Deleted

## 2015-09-23 ENCOUNTER — Ambulatory Visit (HOSPITAL_BASED_OUTPATIENT_CLINIC_OR_DEPARTMENT_OTHER): Payer: BLUE CROSS/BLUE SHIELD

## 2015-09-23 ENCOUNTER — Other Ambulatory Visit: Payer: BLUE CROSS/BLUE SHIELD

## 2015-09-23 ENCOUNTER — Ambulatory Visit: Payer: BLUE CROSS/BLUE SHIELD | Admitting: Hematology

## 2015-09-23 ENCOUNTER — Other Ambulatory Visit (HOSPITAL_BASED_OUTPATIENT_CLINIC_OR_DEPARTMENT_OTHER): Payer: BLUE CROSS/BLUE SHIELD

## 2015-09-23 ENCOUNTER — Ambulatory Visit: Payer: BLUE CROSS/BLUE SHIELD

## 2015-09-23 ENCOUNTER — Encounter: Payer: Self-pay | Admitting: Hematology

## 2015-09-23 VITALS — BP 121/58 | HR 65 | Temp 98.2°F | Resp 18 | Ht 69.0 in | Wt 298.0 lb

## 2015-09-23 DIAGNOSIS — Z452 Encounter for adjustment and management of vascular access device: Secondary | ICD-10-CM

## 2015-09-23 DIAGNOSIS — D6481 Anemia due to antineoplastic chemotherapy: Secondary | ICD-10-CM

## 2015-09-23 DIAGNOSIS — C786 Secondary malignant neoplasm of retroperitoneum and peritoneum: Secondary | ICD-10-CM

## 2015-09-23 DIAGNOSIS — C7800 Secondary malignant neoplasm of unspecified lung: Secondary | ICD-10-CM

## 2015-09-23 DIAGNOSIS — C169 Malignant neoplasm of stomach, unspecified: Secondary | ICD-10-CM

## 2015-09-23 DIAGNOSIS — Z5111 Encounter for antineoplastic chemotherapy: Secondary | ICD-10-CM

## 2015-09-23 DIAGNOSIS — D509 Iron deficiency anemia, unspecified: Secondary | ICD-10-CM

## 2015-09-23 DIAGNOSIS — C801 Malignant (primary) neoplasm, unspecified: Secondary | ICD-10-CM

## 2015-09-23 DIAGNOSIS — R180 Malignant ascites: Secondary | ICD-10-CM

## 2015-09-23 DIAGNOSIS — D630 Anemia in neoplastic disease: Secondary | ICD-10-CM

## 2015-09-23 LAB — COMPREHENSIVE METABOLIC PANEL
ALK PHOS: 126 U/L (ref 40–150)
ALT: 20 U/L (ref 0–55)
AST: 20 U/L (ref 5–34)
Albumin: 3.3 g/dL — ABNORMAL LOW (ref 3.5–5.0)
Anion Gap: 6 mEq/L (ref 3–11)
BUN: 19.6 mg/dL (ref 7.0–26.0)
CALCIUM: 9.1 mg/dL (ref 8.4–10.4)
CHLORIDE: 107 meq/L (ref 98–109)
CO2: 29 mEq/L (ref 22–29)
CREATININE: 0.9 mg/dL (ref 0.7–1.3)
EGFR: >90 mL/min/{1.73_m2} (ref >90)
Glucose: 94 mg/dl (ref 70–140)
Potassium: 4.2 mEq/L (ref 3.5–5.1)
Sodium: 143 mEq/L (ref 136–145)
TOTAL PROTEIN: 6.5 g/dL (ref 6.4–8.3)
Total Bilirubin: 0.34 mg/dL (ref 0.20–1.20)

## 2015-09-23 LAB — CBC WITH DIFFERENTIAL/PLATELET
BASO%: 0.6 % (ref 0.0–2.0)
Basophils Absolute: 0 10*3/uL (ref 0.0–0.1)
EOS ABS: 0.1 10*3/uL (ref 0.0–0.5)
EOS%: 1.9 % (ref 0.0–7.0)
HCT: 38.6 % (ref 38.4–49.9)
HGB: 12.4 g/dL — ABNORMAL LOW (ref 13.0–17.1)
LYMPH%: 19.9 % (ref 14.0–49.0)
MCH: 28.5 pg (ref 27.2–33.4)
MCHC: 32.1 g/dL (ref 32.0–36.0)
MCV: 88.8 fL (ref 79.3–98.0)
MONO#: 0.7 10*3/uL (ref 0.1–0.9)
MONO%: 12 % (ref 0.0–14.0)
NEUT%: 65.6 % (ref 39.0–75.0)
NEUTROS ABS: 3.6 10*3/uL (ref 1.5–6.5)
PLATELETS: 165 10*3/uL (ref 140–400)
RBC: 4.35 10*6/uL (ref 4.20–5.82)
RDW: 16.5 % — ABNORMAL HIGH (ref 11.0–14.6)
WBC: 5.5 10*3/uL (ref 4.0–10.3)
lymph#: 1.1 10*3/uL (ref 0.9–3.3)

## 2015-09-23 MED ORDER — PROCHLORPERAZINE MALEATE 10 MG PO TABS
10.0000 mg | ORAL_TABLET | Freq: Once | ORAL | Status: AC
  Administered 2015-09-23: 10 mg via ORAL

## 2015-09-23 MED ORDER — SODIUM CHLORIDE 0.9 % IV SOLN
2200.0000 mg/m2 | INTRAVENOUS | Status: DC
  Administered 2015-09-23: 5500 mg via INTRAVENOUS
  Filled 2015-09-23: qty 110

## 2015-09-23 MED ORDER — IRINOTECAN HCL CHEMO INJECTION 100 MG/5ML
180.0000 mg/m2 | Freq: Once | INTRAVENOUS | Status: AC
  Administered 2015-09-23: 448 mg via INTRAVENOUS
  Filled 2015-09-23: qty 7

## 2015-09-23 MED ORDER — PROCHLORPERAZINE MALEATE 10 MG PO TABS
ORAL_TABLET | ORAL | Status: AC
Start: 2015-09-23 — End: 2015-09-23
  Filled 2015-09-23: qty 1

## 2015-09-23 MED ORDER — ALTEPLASE 2 MG IJ SOLR
2.0000 mg | Freq: Once | INTRAMUSCULAR | Status: AC | PRN
  Administered 2015-09-23: 2 mg
  Filled 2015-09-23: qty 2

## 2015-09-23 MED ORDER — ATROPINE SULFATE 1 MG/ML IJ SOLN: 0.5000 mg | Freq: Once | INTRAMUSCULAR | Status: DC | PRN

## 2015-09-23 MED ORDER — SODIUM CHLORIDE 0.9 % IJ SOLN
10.0000 mL | INTRAMUSCULAR | Status: DC | PRN
  Administered 2015-09-23: 10 mL
  Filled 2015-09-23: qty 10

## 2015-09-23 MED ORDER — DEXTROSE 5 % IV SOLN
400.0000 mg/m2 | Freq: Once | INTRAVENOUS | Status: AC
  Administered 2015-09-23: 996 mg via INTRAVENOUS
  Filled 2015-09-23: qty 49.8

## 2015-09-23 MED ORDER — HEPARIN SOD (PORK) LOCK FLUSH 100 UNIT/ML IV SOLN
250.0000 [IU] | Freq: Once | INTRAVENOUS | Status: DC | PRN
  Filled 2015-09-23: qty 5

## 2015-09-23 MED ORDER — SODIUM CHLORIDE 0.9 % IV SOLN
Freq: Once | INTRAVENOUS | Status: AC
  Administered 2015-09-23: 11:00:00 via INTRAVENOUS
  Filled 2015-09-23: qty 8

## 2015-09-23 MED ORDER — SODIUM CHLORIDE 0.9 % IV SOLN
Freq: Once | INTRAVENOUS | Status: AC
  Administered 2015-09-23: 11:00:00 via INTRAVENOUS

## 2015-09-23 NOTE — Telephone Encounter (Signed)
per pof to sch pt appt-sent MW email to sch trmt-pt to get updated copy b4 leaving trmt °

## 2015-09-23 NOTE — Patient Instructions (Signed)
[place] Discharge Instructions for Patients Receiving Chemotherapy  Today you received the following chemotherapy agents: 5-FU, Leucovorin, Camtosar.  To help prevent nausea and vomiting after your treatment, we encourage you to take your nausea medications: Compazine 10mg  every 6hrs as needed, zofran 4mg  every 8hrs as needed.    If you develop nausea and vomiting that is not controlled by your nausea medication, call the clinic.   BELOW ARE SYMPTOMS THAT SHOULD BE REPORTED IMMEDIATELY:  *FEVER GREATER THAN 100.5 F  *CHILLS WITH OR WITHOUT FEVER  NAUSEA AND VOMITING THAT IS NOT CONTROLLED WITH YOUR NAUSEA MEDICATION  *UNUSUAL SHORTNESS OF BREATH  *UNUSUAL BRUISING OR BLEEDING  TENDERNESS IN MOUTH AND THROAT WITH OR WITHOUT PRESENCE OF ULCERS  *URINARY PROBLEMS  *BOWEL PROBLEMS  UNUSUAL RASH Items with * indicate a potential emergency and should be followed up as soon as possible.  Feel free to call the clinic you have any questions or concerns. The clinic phone number is [phone].  Please show the [place] at check-in to the Emergency Department and triage nurse.

## 2015-09-23 NOTE — Telephone Encounter (Signed)
Per staff message and POF I have scheduled appts. Advised scheduler of appts. JMW  

## 2015-09-23 NOTE — Telephone Encounter (Signed)
Opened in error

## 2015-09-23 NOTE — Progress Notes (Signed)
Oncology Nurse Navigator Documentation  Oncology Nurse Navigator Flowsheets 09/23/2015  Navigator Location [facility]-Med Onc  Navigator Encounter Type Treatment  Patient Visit Type MedOnc  Treatment Phase Active Tx-FOLFIRI #13  Barriers/Navigation Needs No barriers at this time;No Questions;No Needs  Interventions None required  Support Groups/Services -  [facility] -  Time Spent with Patient 15  Reports feeling well. Says he is happy to be alive.

## 2015-09-23 NOTE — Progress Notes (Signed)
[place]  Telephone:([phone]) [phone] Fax:([phone]) [phone]  Clinic follow-up Note   Patient Care Team: No Pcp Per Patient as PCP - General (General Practice) 09/23/2015  CHIEF COMPLAINTS Follow up metastatic gastric cancer    Gastric adenocarcinoma: Stage IV   04/23/2014 Imaging CT abdomen showed peritoneal carcinomatosis, ascites, thickning of gastric and GEJ wall, CT chest showed a few small b/l lung nodules.    04/24/2014 Tumor Marker CEA  5.6, CA19.9 13   04/25/2014 Initial Diagnosis Gastric adenocarcinoma: Stage IV   04/25/2014 Pathology Results Stomach mass biopsy showed - ADENOCARCINOMA WITH SIGNET RING FEATURES. ascites cytology (+)   04/25/2014 Procedure EGD: There was a large, nearly circumferential, ulcerated, clearly malignant mass that stradles the GE junction. The vast bulk of the mass lays in the stomach.    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 I saw him first when he was recently admitted to [facility]. He is here for the first office visit after hospital discharge.  He was  admitted on 12/15 with progressive, 4 month history of dysphagia, epigastric abdominal discomfort and bloating, early satiation accompanied by nausea, and postprandial vomiting and diarrhea. He has experienced a 30 lbs weight loss in the process. He has noted increased abdominal girth. Patient denies any shortness of breath, chest pain, GI bleed, hematuria or lower extremity swelling. He denies heavy alcohol intake. Denies tobacco abuse. Denies risk factors  for HIV or hepatitis. He denies any family history of GI malignancies.  CT of the abdomen and pelvis with contrast on 12/15 revealed several pulmonary nodules at the bases bilaterally, profound thickening of the distal esophagus extending beyond the gastroesophageal junction into the proximal stomach,cardia and fundus of the stomach, most evident along the lesser curvature. No pathologic dilatation of small bowel or colon. Extensive upper abdominal and retroperitoneal lymphadenopathy was visualized. Moderate volume of ascites, presumably malignant, was noted. Extensive soft tissue nodularity throughout the omentum, compatible with omental caking and widespread peritoneal metastasis. Additionally, there are other areas of enhancing nodularity along the peritoneal surface. Tiny ventral and umbilical hernias containing predominantly omental fat, including some omental implants. There are no aggressive appearing lytic or blastic lesions noted in the visualized portions of the skeleton.  He underwent an EGD by Dr. Ardis Hughs on 04/25/2014, which showed a large fungating mass that straddles the GE junction and occupying the upper 1/3 part of the stomach. Multiple biopsy was taken which showed adenocarcinoma with signet ring features. He also underwent a paracentesis with 4.5 L fluid is removed on 04/26/2014. Cytology was positive for adenocarcinoma with signet ring features, consistent with metastatic disease.  He was discharged to home on 04/27/2014. He did feel a slightly informant of his abdominal bloating after the paracentesis, but not much improvement other symptoms. He had Mediport placement and the second paracentesis was again 4 L fluids removal on 05/02/2014. His appetite is low, he drinks ensure 3 bottles a day, some soup and a very little other foods. His energy level is also low, but able to take care of all his daily needs, and  move around with about much difficulty. His abdominal discomfort/pain are  relatively controlled by morphine 15 mg at night and tramadol 2-3 times during the day. He denies any fever, chills, cough, chest pain or any in the other discomfort.  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  INTERIM HISTORY: Samuel Durham returns for follow up. He is accompanied by his parents to the clinic today. He is doing well overall, has cut back on his diet, and lost about 4 pounds in the past 2 weeks. He denies any significant pain, abdominal bloating, nausea, was a symptoms. He has been tolerating chemotherapy very well. No other new complaints  MEDICAL HISTORY:  Past Medical History  Diagnosis Date  . Medical history non-contributory   . gastric ca dx'd 04/2014    SURGICAL HISTORY: Past Surgical History  Procedure Laterality Date  . Squamous cell carcinoma excision  approx March 2015    removed from top of head  . Esophagogastroduodenoscopy (egd) with propofol N/A 04/25/2014    Procedure: ESOPHAGOGASTRODUODENOSCOPY (EGD) WITH PROPOFOL;  Surgeon: Milus Banister, MD;  Location: [facility] ENDOSCOPY;  Service: Endoscopy;  Laterality: N/A;    SOCIAL HISTORY: Social History   Social History  . Marital Status: Single    Spouse Name: N/A  . Number of Children: N/A  . Years of Education: N/A   Occupational History  . Not on file.   Social History Main Topics  . Smoking status: Never Smoker   . Smokeless tobacco: Never Used  . Alcohol Use: No  . Drug Use: No  . Sexual Activity: Not on file   Other Topics Concern  . Not on file   Social History Narrative   Single, lives alone with pet dog (rescue dog)   Works as Information systems manager; drives; independent   Middle child between #2 sisters-his mother and sisters are very supportive    FAMILY HISTORY: Family History  Problem Relation Age of Onset  . Breast cancer Sister 73   . Hypertension Mother   . Hypertension  Father   Maternal aunt breast cancer at age of 58 Maternal cousin ovarian cancer at age of 33 Maternal ancle prostate cancer age of 72   ALLERGIES:  has No Known Allergies.  MEDICATIONS:  Current Outpatient Prescriptions  Medication Sig Dispense Refill  . docusate sodium 100 MG CAPS Take 100 mg by mouth 2 (two) times daily. 10 capsule 0  . lidocaine-prilocaine (EMLA) cream Apply 1 application topically as needed. 30 g 2  . mirtazapine (REMERON) 15 MG tablet Take 1 tablet (15 mg total) by mouth at bedtime. 30 tablet 1  . ondansetron (ZOFRAN) 4 MG tablet Take 1 tablet (4 mg total) by mouth every 8 (eight) hours as needed for nausea or vomiting. 20 tablet 0  . pantoprazole (PROTONIX) 40 MG tablet Take 1 tablet (40 mg total) by mouth daily. 30 tablet 0  . morphine (MS CONTIN) 15 MG 12 hr tablet Take 1 tablet (15 mg total) by mouth every 12 (twelve) hours. (Patient not taking: Reported on 05/26/2015) 60 tablet 0  . prochlorperazine (COMPAZINE) 10 MG tablet Take 1 tablet (10 mg total) by mouth every 6 (six) hours as needed for nausea or vomiting. (Patient not taking: Reported on 07/22/2015) 30 tablet 3  . traMADol (ULTRAM) 50 MG tablet Take 1-2 tablets (50-100 mg total) by mouth every 6 (six) hours as needed for moderate pain. (Patient  not taking: Reported on 07/22/2015) 30 tablet 0   No current facility-administered medications for this visit.   Facility-Administered Medications Ordered in Other Visits  Medication Dose Route Frequency Provider Last Rate Last Dose  . heparin lock flush 100 unit/mL  250 Units Intracatheter Once PRN Truitt Merle, MD      . sodium chloride 0.9 % injection 10 mL  10 mL Intravenous PRN Truitt Merle, MD      . sodium chloride 0.9 % injection 10 mL  10 mL Intracatheter PRN Truitt Merle, MD   10 mL at 09/23/15 0855    REVIEW OF SYSTEMS:   Constitutional: Denies fevers, chills or abnormal night sweats, no recent weight loss Eyes: Denies blurriness of vision, double vision or watery  eyes Ears, nose, mouth, throat, and face: Denies mucositis or sore throat Respiratory: Denies cough, dyspnea or wheezes Cardiovascular: Denies palpitation, chest discomfort or lower extremity swelling Gastrointestinal:  Less nausea, abdominal bloating and discomfort Skin: Denies abnormal skin rashes Lymphatics: Denies new lymphadenopathy or easy bruising Neurological:Denies numbness, tingling or new weaknesses Behavioral/Psych: Mood is stable, no new changes  All other systems were reviewed with the patient and are negative except those mentioned in the history.  PHYSICAL EXAMINATION: ECOG PERFORMANCE STATUS: 1 BP 121/58 mmHg  Pulse 65  Temp(Src) 98.2 F (36.8 C) (Oral)  Resp 18  Ht _0  (1.753 m)  Wt 298 lb (135.172 kg)  BMI 43.99 kg/m2  SpO2 100%  GENERAL:alert, no distress and comfortable SKIN: skin color, texture, turgor are normal, no rashes or significant lesions EYES: normal, conjunctiva are pink and non-injected, sclera clear OROPHARYNX:no exudate, no erythema and lips, buccal mucosa, and tongue normal  NECK: supple, thyroid normal size, non-tender, without nodularity LYMPH:  no palpable lymphadenopathy in the cervical, axillary or inguinal LUNGS: clear to auscultation and percussion with normal breathing effort HEART: regular rate & rhythm and no murmurs and no lower extremity edema ABDOMEN:abdomen soft, non-tender and normal bowel sounds, (+) small amount of ascites. Musculoskeletal:no cyanosis of digits and no clubbing  PSYCH: alert & oriented x 3 with fluent speech NEURO: no focal motor/sensory deficits  LABORATORY DATA:  I have reviewed the data as listed CBC Latest Ref Rng 09/23/2015 09/09/2015 08/26/2015  WBC 4.0 - 10.3 10e3/uL 5.5 5.8 5.0  Hemoglobin 13.0 - 17.1 g/dL 12.4(L) 11.7(L) 11.9(L)  Hematocrit 38.4 - 49.9 % 38.6 36.0(L) 37.2(L)  Platelets 140 - 400 10e3/uL 165 147 126(L)      CMP Latest Ref Rng 09/23/2015 09/09/2015 08/26/2015  Glucose 70 - 140 mg/dl 94  90 88  BUN 7.0 - 26.0 mg/dL 19.6 15.7 22.3  Creatinine 0.7 - 1.3 mg/dL 0.9 0.9 0.9  Sodium 136 - 145 mEq/L 143 142 143  Potassium 3.5 - 5.1 mEq/L 4.2 4.0 4.3  CO2 22 - 29 mEq/L _1 Calcium 8.4 - 10.4 mg/dL 9.1 8.9 9.1  Total Protein 6.4 - 8.3 g/dL 6.5 6.2(L) 6.5  Total Bilirubin 0.20 - 1.20 mg/dL 0.34 0.34 0.38  Alkaline Phos 40 - 150 U/L 126 121 136  AST 5 - 34 U/L _2 ALT 0 - 55 U/L _3 Results for DEEJAY, KOPPELMAN (MRN [id number]) as of 09/09/2015 07:13  Ref. Range 06/24/2015 11:23 07/22/2015 10:13 08/26/2015 09:35  CEA Latest Units: ng/mL 1.7 2.0   CEA Latest Ref Range: 0.0-4.7 ng/mL 3.0 3.8 7.3 (H)   (CEA was parallel tested in 2 different labs)   Pathology report  Diagnosis  06/07/2014 PERITONEAL/ASCITIC FLUID(SPECIMEN 1 OF 1 COLLECTED 04/26/14): METASTATIC ADENOCARCINOMA WITH SIGNET RING CELL FEATURES.  Stomach, biopsy, r/o cancer 04/25/14 - ADENOCARCINOMA WITH SIGNET RING FEATURES. Microscopic Comment The results were called to Dr. Ardis Hughs on 04/26/2014. (JDP:kh 04/26/14) Claudette Laws MD Pathologist, Electronic Signature (Case signed 04/26/2014)  HER-2/NEU BY CISH - NO AMPLIFICATION OF HER-2 DETECTED. RESULT RATIO OF HER2: CEP 17 SIGNALS 1.20 AVERAGE HER2 COPY NUMBER PER CELL 2.10  ADDITIONAL INFORMATION: Mismatch Repair (MMR) Protein Immunohistochemistry (IHC) IHC Expression Result: MLH1: Preserved nuclear expression (greater 50% tumor expression) MSH2: Preserved nuclear expression (greater 50% tumor expression) MSH6: Preserved nuclear expression (greater 50% tumor expression) PMS2: Preserved nuclear expression (greater 50% tumor expression) * Internal control demonstrates intact nuclear expression Interpretation: NORMAL There is preserved expression of the major and minor MMR proteins. There is a very low probability that microsatellite instability (MSI) is present. However, certain clinically significant MMR protein mutations may result in  preservation of nuclear expression. It is recommended that the preservation of protein expression be correlated with molecular based MSI testing.  [facility] [place] testing: (+) DD22 and NF1   RADIOGRAPHIC STUDIES: I have personally reviewed the radiological images as listed and agreed with the findings in the report  CT chest, abdomen and pelvis with contrast, 07/31/2015 at [facility] IMPRESSION: 1. On physical metastasis has increased in size  2. No significant change in gastric mass, peritoneal carcinomatosis, lung metastasis. 3.  stable splenomegaly.   ASSESSMENT & PLAN:  40 year old gentleman without significant past medical history presents with abdominal bloating, nausea, vomiting, diarrhea, anemia and weight loss. EGD showed a large mass arising from the gastric fundus and extending into GE junction, biopsy showed adenocarcinoma with signet ring features, CT scan is consistent with peritoneal carcinomatosis and bilateral lung metastasis. Ascites cytology was positive for malignant cells.  1. 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 HER-2 negative, MMR-normal -I discussed his surgical biopsy results, cytology results, scan findings with patient and his family members, including his parents and 2 sisters. Unfortunately, this is widely metastatic, not curable, and overall prognosis is very poor.  -His tumor is negative for HER-2 overexpression, no role for trastuzumab. -he had excellent response to first-line chemotherapy FOLFOX, currently on second line FOLFIRI due to disease progression. -I reviewed his recently restaging CT from 07/31/2015 which was done at [facility]. It reported stable gastric mass, peritoneal carcinomatosis and lung metastasis. It raised a question that the umbilical metastasis has increased in size. I reviewed the image with our radiologist in [facility], the umbilical metastasis appears to be a hernia,  increase of the size could be related to the increase in size of hernia. The overall impression is stable peritoneal carcinomatosis. -I agree with [facility]'s recommendation to continue his second line FOLFIRI, no need to change his chemo regimen since there is no significant disease progression and pt is clinically doing well  -surgical debulking and HIPEC was discussed by his oncologist Dr. Liane Comber and surgeon at [facility] in 07/2015 and it's on hold for now.  -He is clinically doing very well, I will continue chemo, he is scheduled to return to [facility] in early June  -I encouraged him to consider clinical trial at [facility], if his next scan shows disease progression. We do not have many other good standard treatment options after the second line chemotherapy for his disease.   2. Recurrent malignant ascites -s/p paracentesis at the beginning of his diagnosis,  and again on 03/10/15 and 03/24/15, and 11/23, 12/6 and 1/6.   -We'll  Continue monitoring  3. Anemia, multifactorial (iron deficient anemia, cancer related and chemotherapy induced ) -His ferritin level was normal at 171, but he has low iron and saturation, likely some degree of iron deficient anemia. -He has received IV Feraheme 510 mg twice, 2 weeks apart -Blood transfusion as needed during chemotherapy if her hemoglobin below 8. -Mild anemia has been stable.  4. Morbid obesity  -He has great appetite, eats very well, he knows to watch his diet. He did lose about 4 pounds in the past 2 weeks.   Plan -continue chemotherapy FOLFIRI today and every 2 weeks with Neulasta on day 3 -He will see our nurse practitioner Lattie Haw in 2 weeks. I'll see him back in 4 weeks -He will follow up at [facility] in early June with a restaging CT scan.   The patient knows to call the clinic with any problems, questions or concerns.  I spent 20 minutes counseling the patient face to face. The total time spent in the appointment was 30   minutes and more than 50% was on counseling    Truitt Merle, MD 09/23/2015

## 2015-09-24 LAB — CEA: CEA1: 20 ng/mL — AB (ref 0.0–4.7)

## 2015-09-25 ENCOUNTER — Ambulatory Visit: Payer: BLUE CROSS/BLUE SHIELD

## 2015-09-25 ENCOUNTER — Ambulatory Visit (HOSPITAL_BASED_OUTPATIENT_CLINIC_OR_DEPARTMENT_OTHER): Payer: BLUE CROSS/BLUE SHIELD

## 2015-09-25 VITALS — BP 113/59 | HR 58 | Temp 97.7°F | Resp 18

## 2015-09-25 DIAGNOSIS — C786 Secondary malignant neoplasm of retroperitoneum and peritoneum: Secondary | ICD-10-CM | POA: Diagnosis not present

## 2015-09-25 DIAGNOSIS — C7800 Secondary malignant neoplasm of unspecified lung: Secondary | ICD-10-CM | POA: Diagnosis not present

## 2015-09-25 DIAGNOSIS — Z5189 Encounter for other specified aftercare: Secondary | ICD-10-CM

## 2015-09-25 DIAGNOSIS — C169 Malignant neoplasm of stomach, unspecified: Secondary | ICD-10-CM | POA: Diagnosis not present

## 2015-09-25 MED ORDER — SODIUM CHLORIDE 0.9 % IJ SOLN
10.0000 mL | INTRAMUSCULAR | Status: DC | PRN
  Administered 2015-09-25: 10 mL
  Filled 2015-09-25: qty 10

## 2015-09-25 MED ORDER — PEGFILGRASTIM INJECTION 6 MG/0.6ML ~~LOC~~
6.0000 mg | PREFILLED_SYRINGE | Freq: Once | SUBCUTANEOUS | Status: AC
  Administered 2015-09-25: 6 mg via SUBCUTANEOUS
  Filled 2015-09-25: qty 0.6

## 2015-09-25 MED ORDER — HEPARIN SOD (PORK) LOCK FLUSH 100 UNIT/ML IV SOLN
500.0000 [IU] | Freq: Once | INTRAVENOUS | Status: AC | PRN
  Administered 2015-09-25: 500 [IU]
  Filled 2015-09-25: qty 5

## 2015-09-25 NOTE — Patient Instructions (Signed)
Pegfilgrastim injection What is this medicine? PEGFILGRASTIM (PEG fil gra stim) is a long-acting granulocyte colony-stimulating factor that stimulates the growth of neutrophils, a type of white blood cell important in the body's fight against infection. It is used to reduce the incidence of fever and infection in patients with certain types of cancer who are receiving chemotherapy that affects the bone marrow, and to increase survival after being exposed to high doses of radiation. This medicine may be used for other purposes; ask your health care provider or pharmacist if you have questions. What should I tell my health care provider before I take this medicine? They need to know if you have any of these conditions: -kidney disease -latex allergy -ongoing radiation therapy -sickle cell disease -skin reactions to acrylic adhesives (On-Body Injector only) -an unusual or allergic reaction to pegfilgrastim, filgrastim, other medicines, foods, dyes, or preservatives -pregnant or trying to get pregnant -breast-feeding How should I use this medicine? This medicine is for injection under the skin. If you get this medicine at home, you will be taught how to prepare and give the pre-filled syringe or how to use the On-body Injector. Refer to the patient Instructions for Use for detailed instructions. Use exactly as directed. Take your medicine at regular intervals. Do not take your medicine more often than directed. It is important that you put your used needles and syringes in a special sharps container. Do not put them in a trash can. If you do not have a sharps container, call your pharmacist or healthcare provider to get one. Talk to your pediatrician regarding the use of this medicine in children. While this drug may be prescribed for selected conditions, precautions do apply. Overdosage: If you think you have taken too much of this medicine contact a poison control center or emergency room at  once. NOTE: This medicine is only for you. Do not share this medicine with others. What if I miss a dose? It is important not to miss your dose. Call your doctor or health care professional if you miss your dose. If you miss a dose due to an On-body Injector failure or leakage, a new dose should be administered as soon as possible using a single prefilled syringe for manual use. What may interact with this medicine? Interactions have not been studied. Give your health care provider a list of all the medicines, herbs, non-prescription drugs, or dietary supplements you use. Also tell them if you smoke, drink alcohol, or use illegal drugs. Some items may interact with your medicine. This list may not describe all possible interactions. Give your health care provider a list of all the medicines, herbs, non-prescription drugs, or dietary supplements you use. Also tell them if you smoke, drink alcohol, or use illegal drugs. Some items may interact with your medicine. What should I watch for while using this medicine? You may need blood work done while you are taking this medicine. If you are going to need a MRI, CT scan, or other procedure, tell your doctor that you are using this medicine (On-Body Injector only). What side effects may I notice from receiving this medicine? Side effects that you should report to your doctor or health care professional as soon as possible: -allergic reactions like skin rash, itching or hives, swelling of the face, lips, or tongue -dizziness -fever -pain, redness, or irritation at site where injected -pinpoint red spots on the skin -red or dark-brown urine -shortness of breath or breathing problems -stomach or side pain, or pain   at the shoulder -swelling -tiredness -trouble passing urine or change in the amount of urine Side effects that usually do not require medical attention (report to your doctor or health care professional if they continue or are  bothersome): -bone pain -muscle pain This list may not describe all possible side effects. Call your doctor for medical advice about side effects. You may report side effects to FDA at 1-800-FDA-1088. Where should I keep my medicine? Keep out of the reach of children. Store pre-filled syringes in a refrigerator between 2 and 8 degrees C (36 and 46 degrees F). Do not freeze. Keep in carton to protect from light. Throw away this medicine if it is left out of the refrigerator for more than 48 hours. Throw away any unused medicine after the expiration date. NOTE: This sheet is a summary. It may not cover all possible information. If you have questions about this medicine, talk to your doctor, pharmacist, or health care provider.    2016, Elsevier/Gold Standard. (2014-05-16 14:30:14)  Port site instructions: keep site clean, apply topical over-the-counter antibiotic ointment as needed. Call [facility] with any changes, such as increasing redness, pain, discharge from site, or fever.

## 2015-10-07 ENCOUNTER — Other Ambulatory Visit (HOSPITAL_BASED_OUTPATIENT_CLINIC_OR_DEPARTMENT_OTHER): Payer: BLUE CROSS/BLUE SHIELD

## 2015-10-07 ENCOUNTER — Other Ambulatory Visit: Payer: BLUE CROSS/BLUE SHIELD

## 2015-10-07 ENCOUNTER — Ambulatory Visit (HOSPITAL_BASED_OUTPATIENT_CLINIC_OR_DEPARTMENT_OTHER): Payer: BLUE CROSS/BLUE SHIELD | Admitting: Nurse Practitioner

## 2015-10-07 ENCOUNTER — Other Ambulatory Visit: Payer: Self-pay | Admitting: Hematology

## 2015-10-07 ENCOUNTER — Ambulatory Visit (HOSPITAL_BASED_OUTPATIENT_CLINIC_OR_DEPARTMENT_OTHER): Payer: BLUE CROSS/BLUE SHIELD

## 2015-10-07 ENCOUNTER — Ambulatory Visit: Payer: BLUE CROSS/BLUE SHIELD

## 2015-10-07 VITALS — BP 114/55 | HR 55 | Temp 98.0°F | Resp 18 | Ht 69.0 in | Wt 302.4 lb

## 2015-10-07 DIAGNOSIS — C786 Secondary malignant neoplasm of retroperitoneum and peritoneum: Secondary | ICD-10-CM

## 2015-10-07 DIAGNOSIS — R1084 Generalized abdominal pain: Secondary | ICD-10-CM

## 2015-10-07 DIAGNOSIS — C7801 Secondary malignant neoplasm of right lung: Secondary | ICD-10-CM

## 2015-10-07 DIAGNOSIS — R21 Rash and other nonspecific skin eruption: Secondary | ICD-10-CM

## 2015-10-07 DIAGNOSIS — C169 Malignant neoplasm of stomach, unspecified: Secondary | ICD-10-CM

## 2015-10-07 DIAGNOSIS — C801 Malignant (primary) neoplasm, unspecified: Secondary | ICD-10-CM

## 2015-10-07 DIAGNOSIS — Z5111 Encounter for antineoplastic chemotherapy: Secondary | ICD-10-CM | POA: Diagnosis not present

## 2015-10-07 DIAGNOSIS — R180 Malignant ascites: Secondary | ICD-10-CM

## 2015-10-07 DIAGNOSIS — D6481 Anemia due to antineoplastic chemotherapy: Secondary | ICD-10-CM

## 2015-10-07 DIAGNOSIS — R109 Unspecified abdominal pain: Secondary | ICD-10-CM

## 2015-10-07 DIAGNOSIS — D509 Iron deficiency anemia, unspecified: Secondary | ICD-10-CM

## 2015-10-07 DIAGNOSIS — C7802 Secondary malignant neoplasm of left lung: Secondary | ICD-10-CM

## 2015-10-07 DIAGNOSIS — R9431 Abnormal electrocardiogram [ECG] [EKG]: Secondary | ICD-10-CM

## 2015-10-07 DIAGNOSIS — D630 Anemia in neoplastic disease: Secondary | ICD-10-CM

## 2015-10-07 DIAGNOSIS — E43 Unspecified severe protein-calorie malnutrition: Secondary | ICD-10-CM

## 2015-10-07 DIAGNOSIS — Z95828 Presence of other vascular implants and grafts: Secondary | ICD-10-CM

## 2015-10-07 LAB — COMPREHENSIVE METABOLIC PANEL
ALK PHOS: 125 U/L (ref 40–150)
ALT: 17 U/L (ref 0–55)
AST: 18 U/L (ref 5–34)
Albumin: 3.3 g/dL — ABNORMAL LOW (ref 3.5–5.0)
Anion Gap: 7 mEq/L (ref 3–11)
BUN: 17.2 mg/dL (ref 7.0–26.0)
CALCIUM: 8.8 mg/dL (ref 8.4–10.4)
CHLORIDE: 109 meq/L (ref 98–109)
CO2: 26 mEq/L (ref 22–29)
Creatinine: 0.9 mg/dL (ref 0.7–1.3)
Glucose: 125 mg/dl (ref 70–140)
POTASSIUM: 3.9 meq/L (ref 3.5–5.1)
SODIUM: 142 meq/L (ref 136–145)
Total Bilirubin: 0.33 mg/dL (ref 0.20–1.20)
Total Protein: 6.4 g/dL (ref 6.4–8.3)

## 2015-10-07 LAB — CBC WITH DIFFERENTIAL/PLATELET
BASO%: 0.2 % (ref 0.0–2.0)
BASOS ABS: 0 10*3/uL (ref 0.0–0.1)
EOS%: 1.8 % (ref 0.0–7.0)
Eosinophils Absolute: 0.1 10*3/uL (ref 0.0–0.5)
HEMATOCRIT: 37.1 % — AB (ref 38.4–49.9)
HGB: 12 g/dL — ABNORMAL LOW (ref 13.0–17.1)
LYMPH%: 25.8 % (ref 14.0–49.0)
MCH: 28.6 pg (ref 27.2–33.4)
MCHC: 32.3 g/dL (ref 32.0–36.0)
MCV: 88.5 fL (ref 79.3–98.0)
MONO#: 0.6 10*3/uL (ref 0.1–0.9)
MONO%: 10.7 % (ref 0.0–14.0)
NEUT#: 3.3 10*3/uL (ref 1.5–6.5)
NEUT%: 61.5 % (ref 39.0–75.0)
Platelets: 153 10*3/uL (ref 140–400)
RBC: 4.19 10*6/uL — ABNORMAL LOW (ref 4.20–5.82)
RDW: 15.9 % — ABNORMAL HIGH (ref 11.0–14.6)
WBC: 5.4 10*3/uL (ref 4.0–10.3)
lymph#: 1.4 10*3/uL (ref 0.9–3.3)

## 2015-10-07 MED ORDER — SODIUM CHLORIDE 0.9 % IV SOLN
Freq: Once | INTRAVENOUS | Status: AC
  Administered 2015-10-07: 12:00:00 via INTRAVENOUS
  Filled 2015-10-07: qty 8

## 2015-10-07 MED ORDER — SODIUM CHLORIDE 0.9 % IV SOLN
Freq: Once | INTRAVENOUS | Status: AC
  Administered 2015-10-07: 11:00:00 via INTRAVENOUS

## 2015-10-07 MED ORDER — PROCHLORPERAZINE MALEATE 10 MG PO TABS
ORAL_TABLET | ORAL | Status: AC
  Filled 2015-10-07: qty 1

## 2015-10-07 MED ORDER — PROCHLORPERAZINE MALEATE 10 MG PO TABS
10.0000 mg | ORAL_TABLET | Freq: Once | ORAL | Status: AC
  Administered 2015-10-07: 10 mg via ORAL

## 2015-10-07 MED ORDER — SODIUM CHLORIDE 0.9% FLUSH
10.0000 mL | INTRAVENOUS | Status: DC | PRN
  Administered 2015-10-07: 10 mL via INTRAVENOUS
  Filled 2015-10-07: qty 10

## 2015-10-07 MED ORDER — SODIUM CHLORIDE 0.9 % IV SOLN
2200.0000 mg/m2 | INTRAVENOUS | Status: DC
  Administered 2015-10-07: 5500 mg via INTRAVENOUS
  Filled 2015-10-07: qty 110

## 2015-10-07 MED ORDER — LEUCOVORIN CALCIUM INJECTION 350 MG
400.0000 mg/m2 | Freq: Once | INTRAVENOUS | Status: AC
  Administered 2015-10-07: 996 mg via INTRAVENOUS
  Filled 2015-10-07: qty 49.8

## 2015-10-07 MED ORDER — IRINOTECAN HCL CHEMO INJECTION 100 MG/5ML
180.0000 mg/m2 | Freq: Once | INTRAVENOUS | Status: AC
  Administered 2015-10-07: 448 mg via INTRAVENOUS
  Filled 2015-10-07: qty 7

## 2015-10-07 NOTE — Patient Instructions (Signed)

## 2015-10-07 NOTE — Patient Instructions (Signed)
[facility] Discharge Instructions for Patients Receiving Chemotherapy  Today you received the following chemotherapy agents: 5-FU, Leucovorin, Camtosar.  To help prevent nausea and vomiting after your treatment, we encourage you to take your nausea medications: Compazine 10mg  every 6hrs as needed, zofran 4mg  every 8hrs as needed.    If you develop nausea and vomiting that is not controlled by your nausea medication, call the clinic.   BELOW ARE SYMPTOMS THAT SHOULD BE REPORTED IMMEDIATELY:  *FEVER GREATER THAN 100.5 F  *CHILLS WITH OR WITHOUT FEVER  NAUSEA AND VOMITING THAT IS NOT CONTROLLED WITH YOUR NAUSEA MEDICATION  *UNUSUAL SHORTNESS OF BREATH  *UNUSUAL BRUISING OR BLEEDING  TENDERNESS IN MOUTH AND THROAT WITH OR WITHOUT PRESENCE OF ULCERS  *URINARY PROBLEMS  *BOWEL PROBLEMS  UNUSUAL RASH Items with * indicate a potential emergency and should be followed up as soon as possible.  Feel free to call the clinic you have any questions or concerns. The clinic phone number is [phone].  Please show the [place] at check-in to the Emergency Department and triage nurse.

## 2015-10-07 NOTE — Progress Notes (Signed)
  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    04/24/2014 Tumor Marker CEA 5.6, CA19.9 13   04/25/2014 Initial Diagnosis Gastric adenocarcinoma: Stage IV   04/25/2014 Pathology Results Stomach mass biopsy showed - ADENOCARCINOMA WITH SIGNET RING FEATURES. ascites cytology (+)   04/25/2014 Procedure EGD: There was a large, nearly circumferential, ulcerated, clearly malignant mass that stradles the GE junction. The vast bulk of the mass lays in the stomach.    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 Zinda returns as scheduled. He continues every 2 week FOLFIRI. He was last treated 09/23/2015. He denies nausea/vomiting. No mouth sores. No diarrhea. He denies pain. He has a good appetite. He reports a several year history of intermittent facial lesions. The lesions are not pruritic.  Objective:  Vital signs in last 24 hours:  Blood pressure 114/55, pulse 55, temperature 98 F (36.7 C), temperature source Oral, resp. rate 18, height _0  (1.753 m), weight 302 lb 6.4 oz (137.168 kg), SpO2 100 %.    HEENT: No thrush or ulcers. Resp: Lungs clear bilaterally. Cardio: Regular rate and rhythm. GI: Abdomen soft and nontender. No hepatomegaly. Vascular: Trace bilateral pretibial edema. Skin:  Small skin tear left lower outer leg. Raised crusted skin lesions on both sides of the face.  Port-A-Cath without erythema.  Lab  Results:  Lab Results  Component Value Date   WBC 5.4 10/07/2015   HGB 12.0* 10/07/2015   HCT 37.1* 10/07/2015   MCV 88.5 10/07/2015   PLT 153 10/07/2015   NEUTROABS 3.3 10/07/2015    Imaging:  No results found.  Medications: I have reviewed the patient's current medications.  Assessment/Plan: 1. 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 HER-2 negative, MMR-normal; initially treated with FOLFOX, currently on second line FOLFIRI due to disease progression. 2. Recurrent malignant ascites s/p paracentesis at the beginning of his diagnosis, and again on 03/10/15 and 03/24/15, and 11/23, 12/6 and 1/6. 3. 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 He has received IV Feraheme 510 mg twice, 2 weeks apart. Blood transfusion as needed during chemotherapy if hemoglobin is less than 8. Stable today. 4. Morbid obesity. 5. Facial rash 10/07/2015. Per his report has occurred intermittently over the past several years. Referral made to dermatology.   Disposition: Mr. Boule appears stable. Plan to proceed with FOLFIRI today as scheduled. Neulasta day 3. He has a follow-up appointment at [facility] next week. He will return for a follow-up visit here in 2 weeks.    Ned Card ANP/GNP-BC   10/07/2015  10:31 AM

## 2015-10-08 ENCOUNTER — Telehealth: Payer: Self-pay | Admitting: Hematology

## 2015-10-08 NOTE — Telephone Encounter (Signed)
per pof to sch pt referral-cld pt and gave pt time /date/loction w/Dr Chancellor 7/24@2 :50

## 2015-10-09 ENCOUNTER — Ambulatory Visit: Payer: BLUE CROSS/BLUE SHIELD

## 2015-10-09 ENCOUNTER — Ambulatory Visit (HOSPITAL_BASED_OUTPATIENT_CLINIC_OR_DEPARTMENT_OTHER): Payer: BLUE CROSS/BLUE SHIELD

## 2015-10-09 VITALS — BP 141/74 | HR 57 | Temp 97.7°F

## 2015-10-09 DIAGNOSIS — Z5189 Encounter for other specified aftercare: Secondary | ICD-10-CM

## 2015-10-09 DIAGNOSIS — C169 Malignant neoplasm of stomach, unspecified: Secondary | ICD-10-CM | POA: Diagnosis not present

## 2015-10-09 MED ORDER — HEPARIN SOD (PORK) LOCK FLUSH 100 UNIT/ML IV SOLN
500.0000 [IU] | Freq: Once | INTRAVENOUS | Status: AC | PRN
  Administered 2015-10-09: 500 [IU]
  Filled 2015-10-09: qty 5

## 2015-10-09 MED ORDER — SODIUM CHLORIDE 0.9 % IJ SOLN
10.0000 mL | INTRAMUSCULAR | Status: DC | PRN
  Administered 2015-10-09: 10 mL
  Filled 2015-10-09: qty 10

## 2015-10-09 MED ORDER — PEGFILGRASTIM INJECTION 6 MG/0.6ML ~~LOC~~
6.0000 mg | PREFILLED_SYRINGE | Freq: Once | SUBCUTANEOUS | Status: AC
  Administered 2015-10-09: 6 mg via SUBCUTANEOUS
  Filled 2015-10-09: qty 0.6

## 2015-10-09 NOTE — Progress Notes (Signed)
To be completed in flush room

## 2015-10-21 ENCOUNTER — Ambulatory Visit (HOSPITAL_BASED_OUTPATIENT_CLINIC_OR_DEPARTMENT_OTHER): Payer: BLUE CROSS/BLUE SHIELD | Admitting: Hematology

## 2015-10-21 ENCOUNTER — Other Ambulatory Visit (HOSPITAL_BASED_OUTPATIENT_CLINIC_OR_DEPARTMENT_OTHER): Payer: BLUE CROSS/BLUE SHIELD

## 2015-10-21 ENCOUNTER — Encounter: Payer: Self-pay | Admitting: Hematology

## 2015-10-21 ENCOUNTER — Ambulatory Visit: Payer: BLUE CROSS/BLUE SHIELD

## 2015-10-21 ENCOUNTER — Ambulatory Visit (HOSPITAL_BASED_OUTPATIENT_CLINIC_OR_DEPARTMENT_OTHER): Payer: BLUE CROSS/BLUE SHIELD

## 2015-10-21 VITALS — BP 119/44 | HR 64 | Temp 97.8°F | Resp 18 | Ht 69.0 in | Wt 303.7 lb

## 2015-10-21 DIAGNOSIS — C786 Secondary malignant neoplasm of retroperitoneum and peritoneum: Secondary | ICD-10-CM

## 2015-10-21 DIAGNOSIS — C7800 Secondary malignant neoplasm of unspecified lung: Secondary | ICD-10-CM

## 2015-10-21 DIAGNOSIS — D500 Iron deficiency anemia secondary to blood loss (chronic): Secondary | ICD-10-CM

## 2015-10-21 DIAGNOSIS — D630 Anemia in neoplastic disease: Secondary | ICD-10-CM

## 2015-10-21 DIAGNOSIS — Z5111 Encounter for antineoplastic chemotherapy: Secondary | ICD-10-CM

## 2015-10-21 DIAGNOSIS — D6481 Anemia due to antineoplastic chemotherapy: Secondary | ICD-10-CM

## 2015-10-21 DIAGNOSIS — Z95828 Presence of other vascular implants and grafts: Secondary | ICD-10-CM

## 2015-10-21 DIAGNOSIS — C801 Malignant (primary) neoplasm, unspecified: Secondary | ICD-10-CM

## 2015-10-21 DIAGNOSIS — C169 Malignant neoplasm of stomach, unspecified: Secondary | ICD-10-CM

## 2015-10-21 DIAGNOSIS — R180 Malignant ascites: Secondary | ICD-10-CM | POA: Diagnosis not present

## 2015-10-21 LAB — COMPREHENSIVE METABOLIC PANEL
ALK PHOS: 114 U/L (ref 40–150)
ALT: 18 U/L (ref 0–55)
ANION GAP: 8 meq/L (ref 3–11)
AST: 16 U/L (ref 5–34)
Albumin: 3.1 g/dL — ABNORMAL LOW (ref 3.5–5.0)
BILIRUBIN TOTAL: 0.56 mg/dL (ref 0.20–1.20)
BUN: 18.1 mg/dL (ref 7.0–26.0)
CALCIUM: 8.9 mg/dL (ref 8.4–10.4)
CO2: 27 mEq/L (ref 22–29)
CREATININE: 0.9 mg/dL (ref 0.7–1.3)
Chloride: 108 mEq/L (ref 98–109)
EGFR: >90 mL/min/{1.73_m2} (ref >90)
Glucose: 88 mg/dl (ref 70–140)
Potassium: 3.9 mEq/L (ref 3.5–5.1)
Sodium: 143 mEq/L (ref 136–145)
TOTAL PROTEIN: 6.3 g/dL — AB (ref 6.4–8.3)

## 2015-10-21 LAB — CBC WITH DIFFERENTIAL/PLATELET
BASO%: 0.5 % (ref 0.0–2.0)
Basophils Absolute: 0 10*3/uL (ref 0.0–0.1)
EOS ABS: 0.1 10*3/uL (ref 0.0–0.5)
EOS%: 1.4 % (ref 0.0–7.0)
HEMATOCRIT: 36.5 % — AB (ref 38.4–49.9)
HGB: 11.8 g/dL — ABNORMAL LOW (ref 13.0–17.1)
LYMPH#: 1.2 10*3/uL (ref 0.9–3.3)
LYMPH%: 18.1 % (ref 14.0–49.0)
MCH: 28.6 pg (ref 27.2–33.4)
MCHC: 32.4 g/dL (ref 32.0–36.0)
MCV: 88.4 fL (ref 79.3–98.0)
MONO#: 0.8 10*3/uL (ref 0.1–0.9)
MONO%: 12.4 % (ref 0.0–14.0)
NEUT%: 67.6 % (ref 39.0–75.0)
NEUTROS ABS: 4.4 10*3/uL (ref 1.5–6.5)
PLATELETS: 159 10*3/uL (ref 140–400)
RBC: 4.12 10*6/uL — ABNORMAL LOW (ref 4.20–5.82)
RDW: 17.1 % — ABNORMAL HIGH (ref 11.0–14.6)
WBC: 6.5 10*3/uL (ref 4.0–10.3)

## 2015-10-21 MED ORDER — IRINOTECAN HCL CHEMO INJECTION 100 MG/5ML
180.0000 mg/m2 | Freq: Once | INTRAVENOUS | Status: AC
  Administered 2015-10-21: 448 mg via INTRAVENOUS
  Filled 2015-10-21: qty 7

## 2015-10-21 MED ORDER — SODIUM CHLORIDE 0.9% FLUSH
10.0000 mL | INTRAVENOUS | Status: DC | PRN
  Administered 2015-10-21: 10 mL via INTRAVENOUS
  Filled 2015-10-21: qty 10

## 2015-10-21 MED ORDER — PROCHLORPERAZINE MALEATE 10 MG PO TABS
ORAL_TABLET | ORAL | Status: AC
  Filled 2015-10-21: qty 1

## 2015-10-21 MED ORDER — SODIUM CHLORIDE 0.9 % IV SOLN
Freq: Once | INTRAVENOUS | Status: AC
  Administered 2015-10-21: 10:00:00 via INTRAVENOUS
  Filled 2015-10-21: qty 8

## 2015-10-21 MED ORDER — LEUCOVORIN CALCIUM INJECTION 350 MG
400.0000 mg/m2 | Freq: Once | INTRAVENOUS | Status: AC
  Administered 2015-10-21: 996 mg via INTRAVENOUS
  Filled 2015-10-21: qty 49.8

## 2015-10-21 MED ORDER — SODIUM CHLORIDE 0.9 % IV SOLN
2200.0000 mg/m2 | INTRAVENOUS | Status: DC
  Administered 2015-10-21: 5500 mg via INTRAVENOUS
  Filled 2015-10-21: qty 110

## 2015-10-21 MED ORDER — SODIUM CHLORIDE 0.9 % IV SOLN
Freq: Once | INTRAVENOUS | Status: AC
  Administered 2015-10-21: 10:00:00 via INTRAVENOUS

## 2015-10-21 MED ORDER — PROCHLORPERAZINE MALEATE 10 MG PO TABS
10.0000 mg | ORAL_TABLET | Freq: Once | ORAL | Status: AC
  Administered 2015-10-21: 10 mg via ORAL

## 2015-10-21 NOTE — Patient Instructions (Signed)

## 2015-10-21 NOTE — Progress Notes (Signed)
[facility]  [phone] [phone]  Clinic follow-up Note   Patient Care Team: No Pcp Per Patient as PCP - General (General Practice) 10/21/2015  CHIEF COMPLAINTS Follow up metastatic gastric cancer    Gastric adenocarcinoma: Stage IV   04/23/2014 Imaging CT abdomen showed peritoneal carcinomatosis, ascites, thickning of gastric and GEJ wall, CT chest showed a few small b/l lung nodules.    04/24/2014 Tumor Marker CEA  5.6, CA19.9 13   04/25/2014 Initial Diagnosis Gastric adenocarcinoma: Stage IV   04/25/2014 Pathology Results Stomach mass biopsy showed - ADENOCARCINOMA WITH SIGNET RING FEATURES. ascites cytology (+)   04/25/2014 Procedure EGD: There was a large, nearly circumferential, ulcerated, clearly malignant mass that stradles the GE junction. The vast bulk of the mass lays in the stomach.    05/07/2014 - 03/11/2015 Chemotherapy mFOLFOX, with neulasta as needed, bolus 5FU omitted from cycle 6 due to cytopenia, stopped due to disease progression    06/03/2014 Miscellaneous [facility] genetic test (+) for:  NF1 splice site [id number] del 47 TP53 R213*    03/25/2015 -  Chemotherapy FOLFIRI, every 2 weeks    HISTORY OF PRESENTING ILLNESS (05/06/2014):  Samuel Durham 40 y.o. 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 I saw him first when he was recently admitted to [facility]. He is here for the first office visit after hospital discharge.  He was  admitted on 12/15 with progressive, 4 month history of dysphagia, epigastric abdominal discomfort and bloating, early satiation accompanied by nausea, and postprandial vomiting and diarrhea. He has experienced a 30 lbs weight loss in the process. He has noted increased abdominal girth. Patient denies any shortness of breath, chest pain, GI bleed, hematuria or lower extremity swelling. He denies heavy alcohol intake. Denies tobacco abuse. Denies risk factors  for HIV or hepatitis. He denies any family history of GI malignancies.  CT of the abdomen and pelvis with contrast on 12/15 revealed several pulmonary nodules at the bases bilaterally, profound thickening of the distal esophagus extending beyond the gastroesophageal junction into the proximal stomach,cardia and fundus of the stomach, most evident along the lesser curvature. No pathologic dilatation of small bowel or colon. Extensive upper abdominal and retroperitoneal lymphadenopathy was visualized. Moderate volume of ascites, presumably malignant, was noted. Extensive soft tissue nodularity throughout the omentum, compatible with omental caking and widespread peritoneal metastasis. Additionally, there are other areas of enhancing nodularity along the peritoneal surface. Tiny ventral and umbilical hernias containing predominantly omental fat, including some omental implants. There are no aggressive appearing lytic or blastic lesions noted in the visualized portions of the skeleton.  He underwent an EGD by Dr. Ardis Hughs on 04/25/2014, which showed a large fungating mass that straddles the GE junction and occupying the upper 1/3 part of the stomach. Multiple biopsy was taken which showed adenocarcinoma with signet ring features. He also underwent a paracentesis with 4.5 L fluid is removed on 04/26/2014. Cytology was positive for adenocarcinoma with signet ring features, consistent with metastatic disease.  He was discharged to home on 04/27/2014. He did feel a slightly informant of his abdominal bloating after the paracentesis, but not much improvement other symptoms. He had Mediport placement and the second paracentesis was again 4 L fluids removal on 05/02/2014. His appetite is low, he drinks ensure 3 bottles a day, some soup and a very little other foods. His energy level is also low, but able to take care of all his daily needs, and  move around with about much difficulty. His abdominal discomfort/pain are  relatively controlled by morphine 15 mg at night and tramadol 2-3 times during the day. He denies any fever, chills, cough, chest pain or any in the other discomfort.  [facility] genetic test (6/46/8032): NF1 splice site [id number] del 47 TP53 R213*   CURRENT THERAPY: FOLFIRI every 2 weeks, started on 03/25/2015.  INTERIM HISTORY: Samuel Durham returns for follow up. He is accompanied by his parents to the clinic today. He is doing well overall, denies any significant pain, abdominal bloating, nausea or other symptoms. His bowel movements normal. He has good appetite and energy level. No other new complaints. He follow-up at [facility] last week, repeat a CT scan showed a stable disease.  MEDICAL HISTORY:  Past Medical History  Diagnosis Date  . Medical history non-contributory   . gastric ca dx'd 04/2014    SURGICAL HISTORY: Past Surgical History  Procedure Laterality Date  . Squamous cell carcinoma excision  approx March 2015    removed from top of head  . Esophagogastroduodenoscopy (egd) with propofol N/A 04/25/2014    Procedure: ESOPHAGOGASTRODUODENOSCOPY (EGD) WITH PROPOFOL;  Surgeon: Milus Banister, MD;  Location: [facility] ENDOSCOPY;  Service: Endoscopy;  Laterality: N/A;    SOCIAL HISTORY: Social History   Social History  . Marital Status: Single    Spouse Name: N/A  . Number of Children: N/A  . Years of Education: N/A   Occupational History  . Not on file.   Social History Main Topics  . Smoking status: Never Smoker   . Smokeless tobacco: Never Used  . Alcohol Use: No  . Drug Use: No  . Sexual Activity: Not on file   Other Topics Concern  . Not on file   Social History Narrative   Single, lives alone with pet dog (rescue dog)   Works as Information systems manager; drives; independent   Middle child between #2 sisters-his mother and sisters are very supportive    FAMILY HISTORY: Family History  Problem Relation Age of Onset  . Breast cancer Sister 74   .  Hypertension Mother   . Hypertension Father   Maternal aunt breast cancer at age of 42 Maternal cousin ovarian cancer at age of 14 Maternal ancle prostate cancer age of 15   ALLERGIES:  has No Known Allergies.  MEDICATIONS:  Current Outpatient Prescriptions  Medication Sig Dispense Refill  . docusate sodium 100 MG CAPS Take 100 mg by mouth 2 (two) times daily. 10 capsule 0  . lidocaine-prilocaine (EMLA) cream Apply 1 application topically as needed. 30 g 2  . mirtazapine (REMERON) 15 MG tablet Take 1 tablet (15 mg total) by mouth at bedtime. 30 tablet 1  . morphine (MS CONTIN) 15 MG 12 hr tablet Take 1 tablet (15 mg total) by mouth every 12 (twelve) hours. 60 tablet 0  . pantoprazole (PROTONIX) 40 MG tablet Take 1 tablet (40 mg total) by mouth daily. 30 tablet 0  . traMADol (ULTRAM) 50 MG tablet Take 1-2 tablets (50-100 mg total) by mouth every 6 (six) hours as needed for moderate pain. 30 tablet 0  . ondansetron (ZOFRAN) 4 MG tablet Take 1 tablet (4 mg total) by mouth every 8 (eight) hours as needed for nausea or vomiting. (Patient not taking: Reported on 10/07/2015) 20 tablet 0  . prochlorperazine (COMPAZINE) 10 MG tablet Take 1 tablet (10 mg total) by mouth every 6 (six) hours as needed for nausea or vomiting. (Patient not taking: Reported on  10/07/2015) 30 tablet 3   No current facility-administered medications for this visit.   Facility-Administered Medications Ordered in Other Visits  Medication Dose Route Frequency Provider Last Rate Last Dose  . fluorouracil (ADRUCIL) 5,500 mg in sodium chloride 0.9 % 140 mL chemo infusion  2,200 mg/m2 (Treatment Plan Actual) Intravenous 1 day or 1 dose Truitt Merle, MD      . irinotecan (CAMPTOSAR) 448 mg in dextrose 5 % 500 mL chemo infusion  180 mg/m2 (Treatment Plan Actual) Intravenous Once Truitt Merle, MD      . leucovorin 996 mg in dextrose 5 % 250 mL infusion  400 mg/m2 (Treatment Plan Actual) Intravenous Once Truitt Merle, MD      . ondansetron (ZOFRAN)  16 mg, dexamethasone (DECADRON) 20 mg in sodium chloride 0.9 % 50 mL IVPB   Intravenous Once Truitt Merle, MD      . prochlorperazine (COMPAZINE) tablet 10 mg  10 mg Oral Once Truitt Merle, MD      . sodium chloride 0.9 % injection 10 mL  10 mL Intravenous PRN Truitt Merle, MD        REVIEW OF SYSTEMS:   Constitutional: Denies fevers, chills or abnormal night sweats, no recent weight loss Eyes: Denies blurriness of vision, double vision or watery eyes Ears, nose, mouth, throat, and face: Denies mucositis or sore throat Respiratory: Denies cough, dyspnea or wheezes Cardiovascular: Denies palpitation, chest discomfort or lower extremity swelling Gastrointestinal:  Less nausea, abdominal bloating and discomfort Skin: Denies abnormal skin rashes Lymphatics: Denies new lymphadenopathy or easy bruising Neurological:Denies numbness, tingling or new weaknesses Behavioral/Psych: Mood is stable, no new changes  All other systems were reviewed with the patient and are negative except those mentioned in the history.  PHYSICAL EXAMINATION: ECOG PERFORMANCE STATUS: 1 BP 119/44 mmHg  Pulse 64  Temp(Src) 97.8 F (36.6 C) (Oral)  Resp 18  Ht 5' 9"  (1.753 m)  Wt 303 lb 11.2 oz (137.757 kg)  BMI 44.83 kg/m2  SpO2 99%  GENERAL:alert, no distress and comfortable SKIN: skin color, texture, turgor are normal, no rashes or significant lesions EYES: normal, conjunctiva are pink and non-injected, sclera clear OROPHARYNX:no exudate, no erythema and lips, buccal mucosa, and tongue normal  NECK: supple, thyroid normal size, non-tender, without nodularity LYMPH:  no palpable lymphadenopathy in the cervical, axillary or inguinal LUNGS: clear to auscultation and percussion with normal breathing effort HEART: regular rate & rhythm and no murmurs and no lower extremity edema ABDOMEN:abdomen soft, non-tender and normal bowel sounds, (+) small amount of ascites. Musculoskeletal:no cyanosis of digits and no clubbing  PSYCH:  alert & oriented x 3 with fluent speech NEURO: no focal motor/sensory deficits  LABORATORY DATA:  I have reviewed the data as listed CBC Latest Ref Rng 10/21/2015 10/07/2015 09/23/2015  WBC 4.0 - 10.3 10e3/uL 6.5 5.4 5.5  Hemoglobin 13.0 - 17.1 g/dL 11.8(L) 12.0(L) 12.4(L)  Hematocrit 38.4 - 49.9 % 36.5(L) 37.1(L) 38.6  Platelets 140 - 400 10e3/uL 159 153 165      CMP Latest Ref Rng 10/21/2015 10/07/2015 09/23/2015  Glucose 70 - 140 mg/dl 88 125 94  BUN 7.0 - 26.0 mg/dL 18.1 17.2 19.6  Creatinine 0.7 - 1.3 mg/dL 0.9 0.9 0.9  Sodium 136 - 145 mEq/L 143 142 143  Potassium 3.5 - 5.1 mEq/L 3.9 3.9 4.2  CO2 22 - 29 mEq/L 27 26 29   Calcium 8.4 - 10.4 mg/dL 8.9 8.8 9.1  Total Protein 6.4 - 8.3 g/dL 6.3(L) 6.4 6.5  Total Bilirubin  0.20 - 1.20 mg/dL 0.56 0.33 0.34  Alkaline Phos 40 - 150 U/L 114 125 126  AST 5 - 34 U/L 16 18 20   ALT 0 - 55 U/L 18 17 20    Results for KIETH, HARTIS (MRN [id number]) as of 10/21/2015 09:50  Ref. Range 06/24/2015 11:23 07/22/2015 10:13 08/26/2015 09:35 09/23/2015 08:35  CEA Latest Units: ng/mL 1.7 2.0    CEA Latest Ref Range: 0.0-4.7 ng/mL 3.0 3.8 7.3 (H) 20.0 (H)    (CEA was parallel tested in 2 different labs)   Pathology report  Diagnosis  06/07/2014 PERITONEAL/ASCITIC FLUID(SPECIMEN 1 OF 1 COLLECTED 04/26/14): METASTATIC ADENOCARCINOMA WITH SIGNET RING CELL FEATURES.  Stomach, biopsy, r/o cancer 04/25/14 - ADENOCARCINOMA WITH SIGNET RING FEATURES. Microscopic Comment The results were called to Dr. Ardis Hughs on 04/26/2014. (JDP:kh 04/26/14) Claudette Laws MD Pathologist, Electronic Signature (Case signed 04/26/2014)  HER-2/NEU BY CISH - NO AMPLIFICATION OF HER-2 DETECTED. RESULT RATIO OF HER2: CEP 17 SIGNALS 1.20 AVERAGE HER2 COPY NUMBER PER CELL 2.10  ADDITIONAL INFORMATION: Mismatch Repair (MMR) Protein Immunohistochemistry (IHC) IHC Expression Result: MLH1: Preserved nuclear expression (greater 50% tumor expression) MSH2: Preserved nuclear expression  (greater 50% tumor expression) MSH6: Preserved nuclear expression (greater 50% tumor expression) PMS2: Preserved nuclear expression (greater 50% tumor expression) * Internal control demonstrates intact nuclear expression Interpretation: NORMAL There is preserved expression of the major and minor MMR proteins. There is a very low probability that microsatellite instability (MSI) is present. However, certain clinically significant MMR protein mutations may result in preservation of nuclear expression. It is recommended that the preservation of protein expression be correlated with molecular based MSI testing.  [facility] [place] testing: (+) OF75 and NF1   RADIOGRAPHIC STUDIES: I have personally reviewed the radiological images as listed and agreed with the findings in the report  CT chest, abdomen and pelvis with contrast, 10/13/2015 [facility] IMPRESSION:  1. Overall stable lung metastases. One left lower lobe metastasis is minimally decreased when compared with 07/31/2015.  1. Stable appearance of the primary treated/residual gastric tumor at the gastroesophageal junction and proximal stomach.  3. Stable partially calcified gastrohepatic and upper retroperitoneal adenopathy.  4. Stable peritoneal carcinomatosis including omental caking and periumbilical implant.  5. Moderate ascites is slightly increased.  ASSESSMENT & PLAN:  40 year old gentleman without significant past medical history presents with abdominal bloating, nausea, vomiting, diarrhea, anemia and weight loss. EGD showed a large mass arising from the gastric fundus and extending into GE junction, biopsy showed adenocarcinoma with signet ring features, CT scan is consistent with peritoneal carcinomatosis and bilateral lung metastasis. Ascites cytology was positive for malignant cells.  1. 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 HER-2 negative,  MMR-normal -I discussed his surgical biopsy results, cytology results, scan findings with patient and his family members, including his parents and 2 sisters. Unfortunately, this is widely metastatic, not curable, and overall prognosis is very poor.  -His tumor is negative for HER-2 overexpression, no role for trastuzumab. -he had excellent response to first-line chemotherapy FOLFOX, currently on second line FOLFIRI due to disease progression. -I reviewed his recently restaging CT from 10/13/2015 whiich was done at [facility]. Per report, he has stable disease. Moderate ascites slightly increased. No other new lesions. I'll upload his scan to our system for comparison and review. -He is clinically doing very well, I will continue chemo, he is scheduled to return to [facility] in mid Aug -Clinical trial options were discussed with patient at [facility],  he is being considered for a phase I /2 trial of nivolumab and an experimental drug    2. Recurrent malignant ascites -s/p paracentesis at the beginning of his diagnosis, and again on 03/10/15 and 03/24/15, and 11/23, 12/6 and 1/6.   -His ascites has slightly increased on his recent scan. We'll follow-up clinically and repeat paracentesis if needed. -We'll  Continue monitoring  3. Anemia, multifactorial (iron deficient anemia, cancer related and chemotherapy induced ) -His ferritin level was normal at 171, but he has low iron and saturation, likely some degree of iron deficient anemia. -He has received IV Feraheme 510 mg twice, 2 weeks apart -Blood transfusion as needed during chemotherapy if her hemoglobin below 8. -Mild anemia has been stable.  4. Morbid obesity  -He has great appetite, eats very well, he knows to watch his diet.   Plan -continue chemotherapy FOLFIRI today and every 2 weeks with Neulasta on day 3 - I'll see him back in 4 weeks -He knows to call us if he needs a paracentesis. -I'll upload his outside CT scan to our  system.  The patient knows to call the clinic with any problems, questions or concerns.  I spent 20 minutes counseling the patient face to face. The total time spent in the appointment was 30  minutes and more than 50% was on counseling    Truitt Merle, MD 10/21/2015

## 2015-10-21 NOTE — Patient Instructions (Signed)
[facility] Discharge Instructions for Patients Receiving Chemotherapy  Today you received the following chemotherapy agents: 5-FU, Leucovorin, Camtosar.  To help prevent nausea and vomiting after your treatment, we encourage you to take your nausea medications: Compazine 10mg  every 6hrs as needed, zofran 4mg  every 8hrs as needed.    If you develop nausea and vomiting that is not controlled by your nausea medication, call the clinic.   BELOW ARE SYMPTOMS THAT SHOULD BE REPORTED IMMEDIATELY:  *FEVER GREATER THAN 100.5 F  *CHILLS WITH OR WITHOUT FEVER  NAUSEA AND VOMITING THAT IS NOT CONTROLLED WITH YOUR NAUSEA MEDICATION  *UNUSUAL SHORTNESS OF BREATH  *UNUSUAL BRUISING OR BLEEDING  TENDERNESS IN MOUTH AND THROAT WITH OR WITHOUT PRESENCE OF ULCERS  *URINARY PROBLEMS  *BOWEL PROBLEMS  UNUSUAL RASH Items with * indicate a potential emergency and should be followed up as soon as possible.  Feel free to call the clinic you have any questions or concerns. The clinic phone number is [phone].  Please show the [place] at check-in to the Emergency Department and triage nurse.

## 2015-10-22 LAB — CEA: CEA: 36.5 ng/mL — ABNORMAL HIGH (ref 0.0–4.7)

## 2015-10-23 ENCOUNTER — Ambulatory Visit: Payer: BLUE CROSS/BLUE SHIELD

## 2015-10-23 ENCOUNTER — Telehealth: Payer: Self-pay | Admitting: Hematology

## 2015-10-23 ENCOUNTER — Ambulatory Visit (HOSPITAL_BASED_OUTPATIENT_CLINIC_OR_DEPARTMENT_OTHER): Payer: BLUE CROSS/BLUE SHIELD

## 2015-10-23 ENCOUNTER — Other Ambulatory Visit: Payer: Self-pay | Admitting: Hematology

## 2015-10-23 ENCOUNTER — Inpatient Hospital Stay
Admission: RE | Admit: 2015-10-23 | Discharge: 2015-10-23 | Disposition: A | Discharge status: Home / Self Care | Payer: Self-pay | Source: Ambulatory Visit | Attending: Hematology | Admitting: Hematology

## 2015-10-23 VITALS — BP 116/65 | HR 54 | Temp 97.5°F | Resp 18

## 2015-10-23 DIAGNOSIS — Z5189 Encounter for other specified aftercare: Secondary | ICD-10-CM | POA: Diagnosis not present

## 2015-10-23 DIAGNOSIS — C801 Malignant (primary) neoplasm, unspecified: Secondary | ICD-10-CM

## 2015-10-23 DIAGNOSIS — C169 Malignant neoplasm of stomach, unspecified: Secondary | ICD-10-CM | POA: Diagnosis not present

## 2015-10-23 MED ORDER — SODIUM CHLORIDE 0.9 % IJ SOLN
10.0000 mL | INTRAMUSCULAR | Status: DC | PRN
  Administered 2015-10-23: 10 mL
  Filled 2015-10-23: qty 10

## 2015-10-23 MED ORDER — HEPARIN SOD (PORK) LOCK FLUSH 100 UNIT/ML IV SOLN
500.0000 [IU] | Freq: Once | INTRAVENOUS | Status: AC | PRN
  Administered 2015-10-23: 500 [IU]
  Filled 2015-10-23: qty 5

## 2015-10-23 MED ORDER — PEGFILGRASTIM INJECTION 6 MG/0.6ML ~~LOC~~
6.0000 mg | PREFILLED_SYRINGE | Freq: Once | SUBCUTANEOUS | Status: AC
  Administered 2015-10-23: 6 mg via SUBCUTANEOUS
  Filled 2015-10-23: qty 0.6

## 2015-10-23 NOTE — Telephone Encounter (Signed)
Gave and printed appt sched and avs for pt for JUne and July

## 2015-10-23 NOTE — Progress Notes (Signed)
Completed in Infusion room 

## 2015-11-03 ENCOUNTER — Other Ambulatory Visit: Payer: Self-pay | Admitting: *Deleted

## 2015-11-03 DIAGNOSIS — C169 Malignant neoplasm of stomach, unspecified: Secondary | ICD-10-CM

## 2015-11-04 ENCOUNTER — Ambulatory Visit: Payer: BLUE CROSS/BLUE SHIELD

## 2015-11-04 ENCOUNTER — Ambulatory Visit (HOSPITAL_BASED_OUTPATIENT_CLINIC_OR_DEPARTMENT_OTHER): Payer: BLUE CROSS/BLUE SHIELD

## 2015-11-04 ENCOUNTER — Other Ambulatory Visit (HOSPITAL_BASED_OUTPATIENT_CLINIC_OR_DEPARTMENT_OTHER): Payer: BLUE CROSS/BLUE SHIELD

## 2015-11-04 VITALS — BP 110/55 | HR 65 | Temp 98.0°F | Resp 16

## 2015-11-04 DIAGNOSIS — C169 Malignant neoplasm of stomach, unspecified: Secondary | ICD-10-CM

## 2015-11-04 DIAGNOSIS — C786 Secondary malignant neoplasm of retroperitoneum and peritoneum: Secondary | ICD-10-CM

## 2015-11-04 DIAGNOSIS — C801 Malignant (primary) neoplasm, unspecified: Secondary | ICD-10-CM

## 2015-11-04 DIAGNOSIS — Z5111 Encounter for antineoplastic chemotherapy: Secondary | ICD-10-CM | POA: Diagnosis not present

## 2015-11-04 LAB — CBC WITH DIFFERENTIAL/PLATELET
BASO%: 0.3 % (ref 0.0–2.0)
Basophils Absolute: 0 10*3/uL (ref 0.0–0.1)
EOS%: 1.4 % (ref 0.0–7.0)
Eosinophils Absolute: 0.1 10*3/uL (ref 0.0–0.5)
HCT: 36.6 % — ABNORMAL LOW (ref 38.4–49.9)
HGB: 11.9 g/dL — ABNORMAL LOW (ref 13.0–17.1)
LYMPH%: 19.8 % (ref 14.0–49.0)
MCH: 28.7 pg (ref 27.2–33.4)
MCHC: 32.5 g/dL (ref 32.0–36.0)
MCV: 88.4 fL (ref 79.3–98.0)
MONO#: 0.8 10*3/uL (ref 0.1–0.9)
MONO%: 12.2 % (ref 0.0–14.0)
NEUT%: 66.3 % (ref 39.0–75.0)
NEUTROS ABS: 4.4 10*3/uL (ref 1.5–6.5)
Platelets: 174 10*3/uL (ref 140–400)
RBC: 4.14 10*6/uL — AB (ref 4.20–5.82)
RDW: 16.3 % — ABNORMAL HIGH (ref 11.0–14.6)
WBC: 6.6 10*3/uL (ref 4.0–10.3)
lymph#: 1.3 10*3/uL (ref 0.9–3.3)

## 2015-11-04 LAB — COMPREHENSIVE METABOLIC PANEL
ALK PHOS: 129 U/L (ref 40–150)
ALT: 17 U/L (ref 0–55)
AST: 18 U/L (ref 5–34)
Albumin: 3 g/dL — ABNORMAL LOW (ref 3.5–5.0)
Anion Gap: 7 mEq/L (ref 3–11)
BILIRUBIN TOTAL: 0.5 mg/dL (ref 0.20–1.20)
BUN: 16.6 mg/dL (ref 7.0–26.0)
CHLORIDE: 109 meq/L (ref 98–109)
CO2: 26 meq/L (ref 22–29)
Calcium: 8.6 mg/dL (ref 8.4–10.4)
Creatinine: 0.8 mg/dL (ref 0.7–1.3)
GLUCOSE: 88 mg/dL (ref 70–140)
Potassium: 3.9 mEq/L (ref 3.5–5.1)
SODIUM: 142 meq/L (ref 136–145)
TOTAL PROTEIN: 6.1 g/dL — AB (ref 6.4–8.3)

## 2015-11-04 MED ORDER — IRINOTECAN HCL CHEMO INJECTION 100 MG/5ML
180.0000 mg/m2 | Freq: Once | INTRAVENOUS | Status: AC
  Administered 2015-11-04: 448 mg via INTRAVENOUS
  Filled 2015-11-04: qty 7

## 2015-11-04 MED ORDER — ATROPINE SULFATE 1 MG/ML IJ SOLN: 0.5000 mg | Freq: Once | INTRAMUSCULAR | Status: DC | PRN

## 2015-11-04 MED ORDER — SODIUM CHLORIDE 0.9 % IV SOLN
Freq: Once | INTRAVENOUS | Status: AC
  Administered 2015-11-04: 11:00:00 via INTRAVENOUS
  Filled 2015-11-04: qty 8

## 2015-11-04 MED ORDER — LEUCOVORIN CALCIUM INJECTION 350 MG
400.0000 mg/m2 | Freq: Once | INTRAMUSCULAR | Status: AC
  Administered 2015-11-04: 996 mg via INTRAVENOUS
  Filled 2015-11-04: qty 49.8

## 2015-11-04 MED ORDER — FLUOROURACIL CHEMO INJECTION 5 GM/100ML
2200.0000 mg/m2 | INTRAVENOUS | Status: DC
  Administered 2015-11-04: 5500 mg via INTRAVENOUS
  Filled 2015-11-04: qty 110

## 2015-11-04 MED ORDER — PROCHLORPERAZINE MALEATE 10 MG PO TABS
10.0000 mg | ORAL_TABLET | Freq: Once | ORAL | Status: AC
  Administered 2015-11-04: 10 mg via ORAL

## 2015-11-04 MED ORDER — SODIUM CHLORIDE 0.9 % IV SOLN
Freq: Once | INTRAVENOUS | Status: AC
  Administered 2015-11-04: 10:00:00 via INTRAVENOUS

## 2015-11-04 MED ORDER — SODIUM CHLORIDE 0.9 % IJ SOLN
10.0000 mL | Freq: Once | INTRAMUSCULAR | Status: AC
  Administered 2015-11-04: 10 mL
  Filled 2015-11-04: qty 10

## 2015-11-04 MED ORDER — PROCHLORPERAZINE MALEATE 10 MG PO TABS
ORAL_TABLET | ORAL | Status: AC
  Filled 2015-11-04: qty 1

## 2015-11-04 NOTE — Patient Instructions (Signed)
[place] Discharge Instructions for Patients Receiving Chemotherapy  Today you received the following chemotherapy agents: 5-FU, Leucovorin, Camtosar.  To help prevent nausea and vomiting after your treatment, we encourage you to take your nausea medications: Compazine 10mg  every 6hrs as needed, zofran 4mg  every 8hrs as needed.    If you develop nausea and vomiting that is not controlled by your nausea medication, call the clinic.   BELOW ARE SYMPTOMS THAT SHOULD BE REPORTED IMMEDIATELY:  *FEVER GREATER THAN 100.5 F  *CHILLS WITH OR WITHOUT FEVER  NAUSEA AND VOMITING THAT IS NOT CONTROLLED WITH YOUR NAUSEA MEDICATION  *UNUSUAL SHORTNESS OF BREATH  *UNUSUAL BRUISING OR BLEEDING  TENDERNESS IN MOUTH AND THROAT WITH OR WITHOUT PRESENCE OF ULCERS  *URINARY PROBLEMS  *BOWEL PROBLEMS  UNUSUAL RASH Items with * indicate a potential emergency and should be followed up as soon as possible.  Feel free to call the clinic you have any questions or concerns. The clinic phone number is [phone].  Please show the [place] at check-in to the Emergency Department and triage nurse.

## 2015-11-05 LAB — CEA: CEA1: 45.7 ng/mL — AB (ref 0.0–4.7)

## 2015-11-06 ENCOUNTER — Ambulatory Visit (HOSPITAL_BASED_OUTPATIENT_CLINIC_OR_DEPARTMENT_OTHER): Payer: BLUE CROSS/BLUE SHIELD

## 2015-11-06 ENCOUNTER — Ambulatory Visit: Payer: BLUE CROSS/BLUE SHIELD

## 2015-11-06 VITALS — BP 128/60 | HR 62 | Temp 98.0°F | Resp 18

## 2015-11-06 DIAGNOSIS — Z5189 Encounter for other specified aftercare: Secondary | ICD-10-CM

## 2015-11-06 DIAGNOSIS — C169 Malignant neoplasm of stomach, unspecified: Secondary | ICD-10-CM

## 2015-11-06 MED ORDER — PEGFILGRASTIM INJECTION 6 MG/0.6ML ~~LOC~~
6.0000 mg | PREFILLED_SYRINGE | Freq: Once | SUBCUTANEOUS | Status: AC
  Administered 2015-11-06: 6 mg via SUBCUTANEOUS
  Filled 2015-11-06: qty 0.6

## 2015-11-06 MED ORDER — SODIUM CHLORIDE 0.9 % IJ SOLN
10.0000 mL | INTRAMUSCULAR | Status: DC | PRN
  Administered 2015-11-06: 10 mL
  Filled 2015-11-06: qty 10

## 2015-11-06 MED ORDER — HEPARIN SOD (PORK) LOCK FLUSH 100 UNIT/ML IV SOLN
500.0000 [IU] | Freq: Once | INTRAVENOUS | Status: AC | PRN
  Administered 2015-11-06: 500 [IU]
  Filled 2015-11-06: qty 5

## 2015-11-06 NOTE — Patient Instructions (Signed)

## 2015-11-06 NOTE — Progress Notes (Signed)
Completed in Infusion room 

## 2015-11-18 ENCOUNTER — Encounter: Payer: Self-pay | Admitting: Hematology

## 2015-11-18 ENCOUNTER — Ambulatory Visit (HOSPITAL_BASED_OUTPATIENT_CLINIC_OR_DEPARTMENT_OTHER): Payer: BLUE CROSS/BLUE SHIELD | Admitting: Hematology

## 2015-11-18 ENCOUNTER — Telehealth: Payer: Self-pay

## 2015-11-18 ENCOUNTER — Ambulatory Visit: Payer: BLUE CROSS/BLUE SHIELD

## 2015-11-18 ENCOUNTER — Ambulatory Visit (HOSPITAL_BASED_OUTPATIENT_CLINIC_OR_DEPARTMENT_OTHER): Payer: BLUE CROSS/BLUE SHIELD

## 2015-11-18 ENCOUNTER — Other Ambulatory Visit (HOSPITAL_BASED_OUTPATIENT_CLINIC_OR_DEPARTMENT_OTHER): Payer: BLUE CROSS/BLUE SHIELD

## 2015-11-18 VITALS — BP 131/79 | HR 71 | Temp 98.1°F | Resp 18 | Ht 69.0 in | Wt 315.3 lb

## 2015-11-18 DIAGNOSIS — C786 Secondary malignant neoplasm of retroperitoneum and peritoneum: Secondary | ICD-10-CM | POA: Diagnosis not present

## 2015-11-18 DIAGNOSIS — R180 Malignant ascites: Secondary | ICD-10-CM | POA: Diagnosis not present

## 2015-11-18 DIAGNOSIS — C7800 Secondary malignant neoplasm of unspecified lung: Secondary | ICD-10-CM

## 2015-11-18 DIAGNOSIS — D509 Iron deficiency anemia, unspecified: Secondary | ICD-10-CM

## 2015-11-18 DIAGNOSIS — C169 Malignant neoplasm of stomach, unspecified: Secondary | ICD-10-CM | POA: Diagnosis not present

## 2015-11-18 DIAGNOSIS — Z5111 Encounter for antineoplastic chemotherapy: Secondary | ICD-10-CM | POA: Diagnosis not present

## 2015-11-18 DIAGNOSIS — D630 Anemia in neoplastic disease: Secondary | ICD-10-CM

## 2015-11-18 DIAGNOSIS — D500 Iron deficiency anemia secondary to blood loss (chronic): Secondary | ICD-10-CM

## 2015-11-18 DIAGNOSIS — D6481 Anemia due to antineoplastic chemotherapy: Secondary | ICD-10-CM

## 2015-11-18 DIAGNOSIS — C801 Malignant (primary) neoplasm, unspecified: Secondary | ICD-10-CM

## 2015-11-18 LAB — CBC WITH DIFFERENTIAL/PLATELET
BASO%: 0.3 % (ref 0.0–2.0)
Basophils Absolute: 0 10*3/uL (ref 0.0–0.1)
EOS ABS: 0.1 10*3/uL (ref 0.0–0.5)
EOS%: 1.2 % (ref 0.0–7.0)
HEMATOCRIT: 34.9 % — AB (ref 38.4–49.9)
HGB: 11.4 g/dL — ABNORMAL LOW (ref 13.0–17.1)
LYMPH#: 1.1 10*3/uL (ref 0.9–3.3)
LYMPH%: 17.6 % (ref 14.0–49.0)
MCH: 28.6 pg (ref 27.2–33.4)
MCHC: 32.7 g/dL (ref 32.0–36.0)
MCV: 87.7 fL (ref 79.3–98.0)
MONO#: 0.9 10*3/uL (ref 0.1–0.9)
MONO%: 13.4 % (ref 0.0–14.0)
NEUT#: 4.3 10*3/uL (ref 1.5–6.5)
NEUT%: 67.5 % (ref 39.0–75.0)
PLATELETS: 188 10*3/uL (ref 140–400)
RBC: 3.98 10*6/uL — AB (ref 4.20–5.82)
RDW: 16.1 % — ABNORMAL HIGH (ref 11.0–14.6)
WBC: 6.4 10*3/uL (ref 4.0–10.3)

## 2015-11-18 LAB — COMPREHENSIVE METABOLIC PANEL
ALT: 15 U/L (ref 0–55)
AST: 18 U/L (ref 5–34)
Albumin: 2.9 g/dL — ABNORMAL LOW (ref 3.5–5.0)
Alkaline Phosphatase: 120 U/L (ref 40–150)
Anion Gap: 9 mEq/L (ref 3–11)
BUN: 17.2 mg/dL (ref 7.0–26.0)
CALCIUM: 8.8 mg/dL (ref 8.4–10.4)
CHLORIDE: 108 meq/L (ref 98–109)
CO2: 26 meq/L (ref 22–29)
CREATININE: 0.9 mg/dL (ref 0.7–1.3)
EGFR: >90 mL/min/{1.73_m2} (ref >90)
Glucose: 104 mg/dl (ref 70–140)
Potassium: 3.9 mEq/L (ref 3.5–5.1)
Sodium: 143 mEq/L (ref 136–145)
TOTAL PROTEIN: 6.1 g/dL — AB (ref 6.4–8.3)
Total Bilirubin: 0.35 mg/dL (ref 0.20–1.20)

## 2015-11-18 MED ORDER — PROCHLORPERAZINE MALEATE 10 MG PO TABS
10.0000 mg | ORAL_TABLET | Freq: Once | ORAL | Status: AC
  Administered 2015-11-18: 10 mg via ORAL

## 2015-11-18 MED ORDER — DEXTROSE 5 % IV SOLN
180.0000 mg/m2 | Freq: Once | INTRAVENOUS | Status: AC
  Administered 2015-11-18: 448 mg via INTRAVENOUS
  Filled 2015-11-18: qty 7

## 2015-11-18 MED ORDER — SODIUM CHLORIDE 0.9 % IV SOLN
2200.0000 mg/m2 | INTRAVENOUS | Status: DC
  Administered 2015-11-18: 5500 mg via INTRAVENOUS
  Filled 2015-11-18: qty 110

## 2015-11-18 MED ORDER — DEXTROSE 5 % IV SOLN
400.0000 mg/m2 | Freq: Once | INTRAVENOUS | Status: AC
  Administered 2015-11-18: 996 mg via INTRAVENOUS
  Filled 2015-11-18: qty 49.8

## 2015-11-18 MED ORDER — SODIUM CHLORIDE 0.9 % IV SOLN
Freq: Once | INTRAVENOUS | Status: AC
  Administered 2015-11-18: 10:00:00 via INTRAVENOUS

## 2015-11-18 MED ORDER — SODIUM CHLORIDE 0.9 % IV SOLN
Freq: Once | INTRAVENOUS | Status: AC
  Administered 2015-11-18: 10:00:00 via INTRAVENOUS
  Filled 2015-11-18: qty 8

## 2015-11-18 MED ORDER — ATROPINE SULFATE 1 MG/ML IJ SOLN: 0.5000 mg | Freq: Once | INTRAMUSCULAR | Status: DC | PRN

## 2015-11-18 MED ORDER — PROCHLORPERAZINE MALEATE 10 MG PO TABS
ORAL_TABLET | ORAL | Status: AC
  Filled 2015-11-18: qty 1

## 2015-11-18 MED ORDER — SODIUM CHLORIDE 0.9 % IJ SOLN
10.0000 mL | INTRAMUSCULAR | Status: DC | PRN
  Administered 2015-11-18: 10 mL
  Filled 2015-11-18: qty 10

## 2015-11-18 NOTE — Progress Notes (Signed)
Samuel Durham  [phone] [phone]  Clinic follow-up Note   Patient Care Team: No Pcp Per Patient as PCP - General (General Practice) 11/18/2015  CHIEF COMPLAINTS Follow up metastatic gastric cancer    Gastric adenocarcinoma: Stage IV   04/23/2014 Imaging CT abdomen showed peritoneal carcinomatosis, ascites, thickning of gastric and GEJ wall, CT chest showed a few small b/l lung nodules.    04/24/2014 Tumor Marker CEA  5.6, CA19.9 13   04/25/2014 Initial Diagnosis Gastric adenocarcinoma: Stage IV   04/25/2014 Pathology Results Stomach mass biopsy showed - ADENOCARCINOMA WITH SIGNET RING FEATURES. ascites cytology (+)   04/25/2014 Procedure EGD: There was a large, nearly circumferential, ulcerated, clearly malignant mass that stradles the GE junction. The vast bulk of the mass lays in the stomach.    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 I saw him first when he was recently admitted to [facility]. He is here for the first office visit after hospital discharge.  He was  admitted on 12/15 with progressive, 4 month history of dysphagia, epigastric abdominal discomfort and bloating, early satiation accompanied by nausea, and postprandial vomiting and diarrhea. He has experienced a 30 lbs weight loss in the process. He has noted increased abdominal girth. Patient denies any shortness of breath, chest pain, GI bleed, hematuria or lower extremity swelling. He denies heavy alcohol intake. Denies tobacco abuse. Denies risk factors  for HIV or hepatitis. He denies any family history of GI malignancies.  CT of the abdomen and pelvis with contrast on 12/15 revealed several pulmonary nodules at the bases bilaterally, profound thickening of the distal esophagus extending beyond the gastroesophageal junction into the proximal stomach,cardia and fundus of the stomach, most evident along the lesser curvature. No pathologic dilatation of small bowel or colon. Extensive upper abdominal and retroperitoneal lymphadenopathy was visualized. Moderate volume of ascites, presumably malignant, was noted. Extensive soft tissue nodularity throughout the omentum, compatible with omental caking and widespread peritoneal metastasis. Additionally, there are other areas of enhancing nodularity along the peritoneal surface. Tiny ventral and umbilical hernias containing predominantly omental fat, including some omental implants. There are no aggressive appearing lytic or blastic lesions noted in the visualized portions of the skeleton.  He underwent an EGD by Dr. Ardis Hughs on 04/25/2014, which showed a large fungating mass that straddles the GE junction and occupying the upper 1/3 part of the stomach. Multiple biopsy was taken which showed adenocarcinoma with signet ring features. He also underwent a paracentesis with 4.5 L fluid is removed on 04/26/2014. Cytology was positive for adenocarcinoma with signet ring features, consistent with metastatic disease.  He was discharged to home on 04/27/2014. He did feel a slightly informant of his abdominal bloating after the paracentesis, but not much improvement other symptoms. He had Mediport placement and the second paracentesis was again 4 L fluids removal on 05/02/2014. His appetite is low, he drinks ensure 3 bottles a day, some soup and a very little other foods. His energy level is also low, but able to take care of all his daily needs, and  move around with about much difficulty. His abdominal discomfort/pain are  relatively controlled by morphine 15 mg at night and tramadol 2-3 times during the day. He denies any fever, chills, cough, chest pain or any in the other discomfort.  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  INTERIM HISTORY: Merric returns for follow up. He is accompanied by his parents to the clinic today. 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 He has gained 13 pounds in the past 2 weeks, no significant abdominal bloating, although he does notice slightly increase the girth of his abdomen. No nausea, pain, or other symptoms. He has been tolerating chemotherapy well.  MEDICAL HISTORY:  Past Medical History  Diagnosis Date  . Medical history non-contributory   . gastric ca dx'd 04/2014    SURGICAL HISTORY: Past Surgical History  Procedure Laterality Date  . Squamous cell carcinoma excision  approx March 2015    removed from top of head  . Esophagogastroduodenoscopy (egd) with propofol N/A 04/25/2014    Procedure: ESOPHAGOGASTRODUODENOSCOPY (EGD) WITH PROPOFOL;  Surgeon: Milus Banister, MD;  Location: [facility];  Service: Endoscopy;  Laterality: N/A;    SOCIAL HISTORY: Social History   Social History  . Marital Status: Single    Spouse Name: N/A  . Number of Children: N/A  . Years of Education: N/A   Occupational History  . Not on file.   Social History Main Topics  . Smoking status: Never Smoker   . Smokeless tobacco: Never Used  . Alcohol Use: No  . Drug Use: No  . Sexual Activity: Not on file   Other Topics Concern  . Not on file   Social History Narrative   Single, lives alone with pet dog (rescue dog)   Works as Information systems manager; drives; independent   Middle child between #2 sisters-his mother and sisters are very supportive     FAMILY HISTORY: Family History  Problem Relation Age of Onset  . Breast cancer Sister 34   . Hypertension Mother   . Hypertension Father   Maternal aunt breast cancer at age of 9 Maternal cousin ovarian cancer at age of 24 Maternal ancle prostate cancer age of 51   ALLERGIES:  has No Known Allergies.  MEDICATIONS:  Current Outpatient Prescriptions  Medication Sig Dispense Refill  . lidocaine-prilocaine (EMLA) cream Apply 1 application topically as needed. 30 g 2  . docusate sodium 100 MG CAPS Take 100 mg by mouth 2 (two) times daily. (Patient not taking: Reported on 11/18/2015) 10 capsule 0  . mirtazapine (REMERON) 15 MG tablet Take 1 tablet (15 mg total) by mouth at bedtime. (Patient not taking: Reported on 11/18/2015) 30 tablet 1  . morphine (MS CONTIN) 15 MG 12 hr tablet Take 1 tablet (15 mg total) by mouth every 12 (twelve) hours. (Patient not taking: Reported on 11/18/2015) 60 tablet 0  . ondansetron (ZOFRAN) 4 MG tablet Take 1 tablet (4 mg total) by mouth every 8 (eight) hours as needed for nausea or vomiting. (Patient not taking: Reported on 10/07/2015) 20 tablet 0  . pantoprazole (PROTONIX) 40 MG tablet Take 1 tablet (40 mg total) by mouth daily. (Patient not taking: Reported on 11/18/2015) 30 tablet 0  . prochlorperazine (COMPAZINE) 10 MG tablet Take 1 tablet (10 mg total) by  mouth every 6 (six) hours as needed for nausea or vomiting. (Patient not taking: Reported on 10/07/2015) 30 tablet 3  . traMADol (ULTRAM) 50 MG tablet Take 1-2 tablets (50-100 mg total) by mouth every 6 (six) hours as needed for moderate pain. (Patient not taking: Reported on 11/18/2015) 30 tablet 0   No current facility-administered medications for this visit.   Facility-Administered Medications Ordered in Other Visits  Medication Dose Route Frequency Provider Last Rate Last Dose  . sodium chloride 0.9 % injection 10 mL  10 mL Intravenous PRN Truitt Merle, MD      . sodium chloride 0.9 % injection 10 mL  10 mL  Intracatheter PRN Truitt Merle, MD   10 mL at 11/18/15 0845    REVIEW OF SYSTEMS:   Constitutional: Denies fevers, chills or abnormal night sweats, no recent weight loss Eyes: Denies blurriness of vision, double vision or watery eyes Ears, nose, mouth, throat, and face: Denies mucositis or sore throat Respiratory: Denies cough, dyspnea or wheezes Cardiovascular: Denies palpitation, chest discomfort or lower extremity swelling Gastrointestinal:  Less nausea, abdominal bloating and discomfort Skin: Denies abnormal skin rashes Lymphatics: Denies new lymphadenopathy or easy bruising Neurological:Denies numbness, tingling or new weaknesses Behavioral/Psych: Mood is stable, no new changes  All other systems were reviewed with the patient and are negative except those mentioned in the history.  PHYSICAL EXAMINATION: ECOG PERFORMANCE STATUS: 1 BP 131/79 mmHg  Pulse 71  Temp(Src) 98.1 F (36.7 C) (Oral)  Resp 18  Ht 5' 9"  (1.753 m)  Wt 315 lb 4.8 oz (143.019 kg)  BMI 46.54 kg/m2  SpO2 98%  GENERAL:alert, no distress and comfortable SKIN: skin color, texture, turgor are normal, no rashes or significant lesions EYES: normal, conjunctiva are pink and non-injected, sclera clear OROPHARYNX:no exudate, no erythema and lips, buccal mucosa, and tongue normal  NECK: supple, thyroid normal size, non-tender, without nodularity LYMPH:  no palpable lymphadenopathy in the cervical, axillary or inguinal LUNGS: clear to auscultation and percussion with normal breathing effort HEART: regular rate & rhythm and no murmurs and no lower extremity edema ABDOMEN:abdomen soft, non-tender and normal bowel sounds, (+) small amount of ascites. Musculoskeletal:no cyanosis of digits and no clubbing  PSYCH: alert & oriented x 3 with fluent speech NEURO: no focal motor/sensory deficits  LABORATORY DATA:  I have reviewed the data as listed CBC Latest Ref Rng 11/18/2015 11/04/2015 10/21/2015  WBC 4.0 - 10.3 10e3/uL 6.4  6.6 6.5  Hemoglobin 13.0 - 17.1 g/dL 11.4(L) 11.9(L) 11.8(L)  Hematocrit 38.4 - 49.9 % 34.9(L) 36.6(L) 36.5(L)  Platelets 140 - 400 10e3/uL 188 174 159      CMP Latest Ref Rng 11/04/2015 10/21/2015 10/07/2015  Glucose 70 - 140 mg/dl 88 88 125  BUN 7.0 - 26.0 mg/dL 16.6 18.1 17.2  Creatinine 0.7 - 1.3 mg/dL 0.8 0.9 0.9  Sodium 136 - 145 mEq/L 142 143 142  Potassium 3.5 - 5.1 mEq/L 3.9 3.9 3.9  CO2 22 - 29 mEq/L 26 27 26   Calcium 8.4 - 10.4 mg/dL 8.6 8.9 8.8  Total Protein 6.4 - 8.3 g/dL 6.1(L) 6.3(L) 6.4  Total Bilirubin 0.20 - 1.20 mg/dL 0.50 0.56 0.33  Alkaline Phos 40 - 150 U/L 129 114 125  AST 5 - 34 U/L 18 16 18   ALT 0 - 55 U/L 17 18 17    Results for MARTYN, TIMME (MRN [id number]) as of 11/18/2015 09:35  Ref. Range 09/23/2015 08:35 10/21/2015 08:25 11/04/2015 09:40  CEA Latest Ref Range: 0.0-4.7 ng/mL 20.0 (H)  36.5 (H) 45.7 (H)    Pathology report  Diagnosis  06/07/2014 PERITONEAL/ASCITIC FLUID(SPECIMEN 1 OF 1 COLLECTED 04/26/14): METASTATIC ADENOCARCINOMA WITH SIGNET RING CELL FEATURES.  Stomach, biopsy, r/o cancer 04/25/14 - ADENOCARCINOMA WITH SIGNET RING FEATURES. Microscopic Comment The results were called to Dr. Ardis Hughs on 04/26/2014. (JDP:kh 04/26/14) Claudette Laws MD Pathologist, Electronic Signature (Case signed 04/26/2014)  HER-2/NEU BY CISH - NO AMPLIFICATION OF HER-2 DETECTED. RESULT RATIO OF HER2: CEP 17 SIGNALS 1.20 AVERAGE HER2 COPY NUMBER PER CELL 2.10  ADDITIONAL INFORMATION: Mismatch Repair (MMR) Protein Immunohistochemistry (IHC) IHC Expression Result: MLH1: Preserved nuclear expression (greater 50% tumor expression) MSH2: Preserved nuclear expression (greater 50% tumor expression) MSH6: Preserved nuclear expression (greater 50% tumor expression) PMS2: Preserved nuclear expression (greater 50% tumor expression) * Internal control demonstrates intact nuclear expression Interpretation: NORMAL There is preserved expression of the major and minor MMR  proteins. There is a very low probability that microsatellite instability (MSI) is present. However, certain clinically significant MMR protein mutations may result in preservation of nuclear expression. It is recommended that the preservation of protein expression be correlated with molecular based MSI testing.  [facility] [place] testing: (+) HM09 and NF1   RADIOGRAPHIC STUDIES: I have personally reviewed the radiological images as listed and agreed with the findings in the report  CT chest, abdomen and pelvis with contrast, 10/13/2015 [facility] IMPRESSION:  1. Overall stable lung metastases. One left lower lobe metastasis is minimally decreased when compared with 07/31/2015.  1. Stable appearance of the primary treated/residual gastric tumor at the gastroesophageal junction and proximal stomach.  3. Stable partially calcified gastrohepatic and upper retroperitoneal adenopathy.  4. Stable peritoneal carcinomatosis including omental caking and periumbilical implant.  5. Moderate ascites is slightly increased.  ASSESSMENT & PLAN:  40 year old gentleman without significant past medical history presents with abdominal bloating, nausea, vomiting, diarrhea, anemia and weight loss. EGD showed a large mass arising from the gastric fundus and extending into GE junction, biopsy showed adenocarcinoma with signet ring features, CT scan is consistent with peritoneal carcinomatosis and bilateral lung metastasis. Ascites cytology was positive for malignant cells.  1. 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 HER-2 negative, MMR-normal -I discussed his surgical biopsy results, cytology results, scan findings with patient and his family members, including his parents and 2 sisters. Unfortunately, this is widely metastatic, not curable, and overall prognosis is very poor.  -His tumor is negative for HER-2 overexpression, no role for  trastuzumab. -he had excellent response to first-line chemotherapy FOLFOX, currently on second line FOLFIRI due to disease progression. -I reviewed his recently restaging CT from 10/13/2015 whiich was done at [facility]. Per report, he has stable disease. Moderate ascites slightly increased. No other new lesions. I'll upload his scan to our system for comparison and review. -He is clinically doing very well, I will continue chemo, he is scheduled to return to [facility] in mid Aug -Clinical trial options were discussed with patient at [facility], he is being considered for a phase I /2 trial of nivolumab and an experimental drug  -Lab results reviewed with patient, mild stable anemia, otherwise adequate for treatment, we'll proceed chemotherapy today and continue every 2 weeks -Due to his significant weight gain in the past 2 weeks, I'll obtain a ultrasound guided paracentesis before next chemotherapy.   2. Recurrent malignant ascites -s/p paracentesis at the beginning of his diagnosis, and again on 03/10/15 and 03/24/15, and 11/23, 12/6 and 1/6.   -His  ascites has slightly increased on his recent scan. We'll follow-up clinically and repeat paracentesis if needed. Repeat ultrasound before next chemotherapy -We'll  Continue monitoring  3. Anemia, multifactorial (iron deficient anemia, cancer related and chemotherapy induced ) -His ferritin level was normal at 171, but he has low iron and saturation, likely some degree of iron deficient anemia. -He has received IV Feraheme 510 mg twice, 2 weeks apart -Blood transfusion as needed during chemotherapy if her hemoglobin below 8. -Mild anemia has been stable.  4. Morbid obesity  -He has great appetite, eats very well, he knows to watch his diet.   Plan -continue chemotherapy FOLFIRI today and every 2 weeks with Neulasta on day 3 - I'll see him back in 6 weeks, after his next visit at [facility] on 8/14 -I'll set up a ultrasound of abdomen to  see if we need paracentesis on 7/24 before his next treatment   The patient knows to call the clinic with any problems, questions or concerns.  I spent 20 minutes counseling the patient face to face. The total time spent in the appointment was 30  minutes and more than 50% was on counseling    Truitt Merle, MD 11/18/2015

## 2015-11-18 NOTE — Telephone Encounter (Signed)
Called and left message with [facility] to cancel patients  appt on 7/24, call back number given to confirm cancellation.

## 2015-11-18 NOTE — Patient Instructions (Signed)

## 2015-11-18 NOTE — Patient Instructions (Signed)
[facility] Discharge Instructions for Patients Receiving Chemotherapy  Today you received the following chemotherapy agents leucovorin/flourouracil/irinotecan  To help prevent nausea and vomiting after your treatment, we encourage you to take your nausea medication as directed   If you develop nausea and vomiting that is not controlled by your nausea medication, call the clinic.   BELOW ARE SYMPTOMS THAT SHOULD BE REPORTED IMMEDIATELY:  *FEVER GREATER THAN 100.5 F  *CHILLS WITH OR WITHOUT FEVER  NAUSEA AND VOMITING THAT IS NOT CONTROLLED WITH YOUR NAUSEA MEDICATION  *UNUSUAL SHORTNESS OF BREATH  *UNUSUAL BRUISING OR BLEEDING  TENDERNESS IN MOUTH AND THROAT WITH OR WITHOUT PRESENCE OF ULCERS  *URINARY PROBLEMS  *BOWEL PROBLEMS  UNUSUAL RASH Items with * indicate a potential emergency and should be followed up as soon as possible.  Feel free to call the clinic you have any questions or concerns. The clinic phone number is [phone].

## 2015-11-18 NOTE — Telephone Encounter (Signed)
Call back from [facility] dermatology, appt was cancelled per request.

## 2015-11-18 NOTE — Telephone Encounter (Signed)
-----   Message from Owens Shark, NP sent at 11/18/2015  9:41 AM EDT ----- Patients father told me today Samuel Durham made a sooner appt with a different dermatologist and wants this appt cancelled. Will you please call and cancel? Thanks  w/Dr Mechele Dawley Derm 7/24@2 :50

## 2015-11-20 ENCOUNTER — Ambulatory Visit (HOSPITAL_BASED_OUTPATIENT_CLINIC_OR_DEPARTMENT_OTHER): Payer: BLUE CROSS/BLUE SHIELD

## 2015-11-20 ENCOUNTER — Ambulatory Visit: Payer: BLUE CROSS/BLUE SHIELD

## 2015-11-20 VITALS — BP 117/67 | HR 60 | Temp 97.8°F

## 2015-11-20 DIAGNOSIS — Z5189 Encounter for other specified aftercare: Secondary | ICD-10-CM

## 2015-11-20 DIAGNOSIS — C169 Malignant neoplasm of stomach, unspecified: Secondary | ICD-10-CM

## 2015-11-20 MED ORDER — PEGFILGRASTIM INJECTION 6 MG/0.6ML ~~LOC~~
6.0000 mg | PREFILLED_SYRINGE | Freq: Once | SUBCUTANEOUS | Status: AC
  Administered 2015-11-20: 6 mg via SUBCUTANEOUS
  Filled 2015-11-20: qty 0.6

## 2015-11-20 MED ORDER — HEPARIN SOD (PORK) LOCK FLUSH 100 UNIT/ML IV SOLN
500.0000 [IU] | Freq: Once | INTRAVENOUS | Status: AC | PRN
  Administered 2015-11-20: 500 [IU]
  Filled 2015-11-20: qty 5

## 2015-11-20 MED ORDER — SODIUM CHLORIDE 0.9 % IJ SOLN
10.0000 mL | INTRAMUSCULAR | Status: DC | PRN
  Administered 2015-11-20: 10 mL
  Filled 2015-11-20: qty 10

## 2015-11-20 NOTE — Progress Notes (Signed)
Pt in for pump disconnect. Dressing loose due to sweating. Mild erythema noted at needle insertion site. No drainage noted. Pt reports it looks like this when de-accessed. Instructed him to call office for fever/chills/ drainage from port or other signs of infection. He voiced understanding.

## 2015-11-20 NOTE — Progress Notes (Signed)
neulasta injection given in tx with pump d/c

## 2015-11-24 ENCOUNTER — Encounter: Payer: Self-pay | Admitting: Hematology

## 2015-12-01 ENCOUNTER — Ambulatory Visit (HOSPITAL_COMMUNITY)
Admission: RE | Admit: 2015-12-01 | Discharge: 2015-12-01 | Disposition: A | Discharge status: Home / Self Care | Payer: BLUE CROSS/BLUE SHIELD | Source: Ambulatory Visit | Attending: Hematology | Admitting: Hematology

## 2015-12-01 DIAGNOSIS — R180 Malignant ascites: Secondary | ICD-10-CM | POA: Diagnosis not present

## 2015-12-01 NOTE — Procedures (Signed)
Ultrasound-guided  therapeutic paracentesis performed yielding 5 liters (maximum ordered) of slightly hazy, yellow fluid. No immediate complications.

## 2015-12-02 ENCOUNTER — Ambulatory Visit: Payer: BLUE CROSS/BLUE SHIELD

## 2015-12-02 ENCOUNTER — Other Ambulatory Visit (HOSPITAL_BASED_OUTPATIENT_CLINIC_OR_DEPARTMENT_OTHER): Payer: BLUE CROSS/BLUE SHIELD

## 2015-12-02 ENCOUNTER — Ambulatory Visit (HOSPITAL_BASED_OUTPATIENT_CLINIC_OR_DEPARTMENT_OTHER): Payer: BLUE CROSS/BLUE SHIELD

## 2015-12-02 VITALS — BP 125/75 | HR 69 | Temp 98.4°F | Resp 18

## 2015-12-02 DIAGNOSIS — C7800 Secondary malignant neoplasm of unspecified lung: Secondary | ICD-10-CM | POA: Diagnosis not present

## 2015-12-02 DIAGNOSIS — C801 Malignant (primary) neoplasm, unspecified: Secondary | ICD-10-CM

## 2015-12-02 DIAGNOSIS — C786 Secondary malignant neoplasm of retroperitoneum and peritoneum: Secondary | ICD-10-CM | POA: Diagnosis not present

## 2015-12-02 DIAGNOSIS — C169 Malignant neoplasm of stomach, unspecified: Secondary | ICD-10-CM | POA: Diagnosis not present

## 2015-12-02 DIAGNOSIS — Z95828 Presence of other vascular implants and grafts: Secondary | ICD-10-CM

## 2015-12-02 DIAGNOSIS — Z5111 Encounter for antineoplastic chemotherapy: Secondary | ICD-10-CM

## 2015-12-02 LAB — COMPREHENSIVE METABOLIC PANEL WITH GFR
ALT: 16 U/L (ref 0–55)
AST: 18 U/L (ref 5–34)
Albumin: 2.9 g/dL — ABNORMAL LOW (ref 3.5–5.0)
Alkaline Phosphatase: 113 U/L (ref 40–150)
Anion Gap: 8 meq/L (ref 3–11)
BUN: 16.4 mg/dL (ref 7.0–26.0)
CO2: 26 meq/L (ref 22–29)
Calcium: 8.9 mg/dL (ref 8.4–10.4)
Chloride: 109 meq/L (ref 98–109)
Creatinine: 0.9 mg/dL (ref 0.7–1.3)
EGFR: >90 ml/min/1.73 m2
Glucose: 97 mg/dL (ref 70–140)
Potassium: 3.8 meq/L (ref 3.5–5.1)
Sodium: 142 meq/L (ref 136–145)
Total Bilirubin: 0.47 mg/dL (ref 0.20–1.20)
Total Protein: 6.1 g/dL — ABNORMAL LOW (ref 6.4–8.3)

## 2015-12-02 LAB — CBC WITH DIFFERENTIAL/PLATELET
BASO%: 0.3 % (ref 0.0–2.0)
BASOS ABS: 0 10*3/uL (ref 0.0–0.1)
EOS%: 1.3 % (ref 0.0–7.0)
Eosinophils Absolute: 0.1 10*3/uL (ref 0.0–0.5)
HEMATOCRIT: 36.2 % — AB (ref 38.4–49.9)
HEMOGLOBIN: 11.9 g/dL — AB (ref 13.0–17.1)
LYMPH#: 1 10*3/uL (ref 0.9–3.3)
LYMPH%: 16.3 % (ref 14.0–49.0)
MCH: 28.4 pg (ref 27.2–33.4)
MCHC: 32.9 g/dL (ref 32.0–36.0)
MCV: 86.4 fL (ref 79.3–98.0)
MONO#: 0.8 10*3/uL (ref 0.1–0.9)
MONO%: 12.2 % (ref 0.0–14.0)
NEUT#: 4.5 10*3/uL (ref 1.5–6.5)
NEUT%: 69.9 % (ref 39.0–75.0)
Platelets: 221 10*3/uL (ref 140–400)
RBC: 4.19 10*6/uL — ABNORMAL LOW (ref 4.20–5.82)
RDW: 15.9 % — AB (ref 11.0–14.6)
WBC: 6.4 10*3/uL (ref 4.0–10.3)

## 2015-12-02 MED ORDER — PROCHLORPERAZINE MALEATE 10 MG PO TABS
ORAL_TABLET | ORAL | Status: AC
  Filled 2015-12-02: qty 1

## 2015-12-02 MED ORDER — ATROPINE SULFATE 1 MG/ML IJ SOLN: 0.5000 mg | Freq: Once | INTRAMUSCULAR | Status: DC | PRN

## 2015-12-02 MED ORDER — LEUCOVORIN CALCIUM INJECTION 350 MG
400.0000 mg/m2 | Freq: Once | INTRAVENOUS | Status: AC
  Administered 2015-12-02: 996 mg via INTRAVENOUS
  Filled 2015-12-02: qty 49.8

## 2015-12-02 MED ORDER — PROCHLORPERAZINE MALEATE 10 MG PO TABS
10.0000 mg | ORAL_TABLET | Freq: Once | ORAL | Status: AC
  Administered 2015-12-02: 10 mg via ORAL

## 2015-12-02 MED ORDER — SODIUM CHLORIDE 0.9 % IV SOLN
2200.0000 mg/m2 | INTRAVENOUS | Status: DC
  Administered 2015-12-02: 5500 mg via INTRAVENOUS
  Filled 2015-12-02: qty 110

## 2015-12-02 MED ORDER — SODIUM CHLORIDE 0.9% FLUSH
10.0000 mL | INTRAVENOUS | Status: DC | PRN
  Administered 2015-12-02: 10 mL via INTRAVENOUS
  Filled 2015-12-02: qty 10

## 2015-12-02 MED ORDER — SODIUM CHLORIDE 0.9 % IV SOLN
Freq: Once | INTRAVENOUS | Status: AC
  Administered 2015-12-02: 10:00:00 via INTRAVENOUS
  Filled 2015-12-02: qty 8

## 2015-12-02 MED ORDER — IRINOTECAN HCL CHEMO INJECTION 100 MG/5ML
180.0000 mg/m2 | Freq: Once | INTRAVENOUS | Status: AC
  Administered 2015-12-02: 448 mg via INTRAVENOUS
  Filled 2015-12-02: qty 15

## 2015-12-02 MED ORDER — SODIUM CHLORIDE 0.9 % IV SOLN
Freq: Once | INTRAVENOUS | Status: AC
  Administered 2015-12-02: 10:00:00 via INTRAVENOUS

## 2015-12-02 NOTE — Patient Instructions (Signed)
[facility] Discharge Instructions for Patients Receiving Chemotherapy  Today you received the following chemotherapy agents: Irinotecan, Leucovorin, Adrucil.  To help prevent nausea and vomiting after your treatment, we encourage you to take your nausea medication as directed.    If you develop nausea and vomiting that is not controlled by your nausea medication, call the clinic.   BELOW ARE SYMPTOMS THAT SHOULD BE REPORTED IMMEDIATELY:  *FEVER GREATER THAN 100.5 F  *CHILLS WITH OR WITHOUT FEVER  NAUSEA AND VOMITING THAT IS NOT CONTROLLED WITH YOUR NAUSEA MEDICATION  *UNUSUAL SHORTNESS OF BREATH  *UNUSUAL BRUISING OR BLEEDING  TENDERNESS IN MOUTH AND THROAT WITH OR WITHOUT PRESENCE OF ULCERS  *URINARY PROBLEMS  *BOWEL PROBLEMS  UNUSUAL RASH Items with * indicate a potential emergency and should be followed up as soon as possible.  Feel free to call the clinic you have any questions or concerns. The clinic phone number is [phone].  Please show the CHEMO ALERT CARD at check-in to the Emergency Department and triage nurse.   

## 2015-12-03 ENCOUNTER — Ambulatory Visit (HOSPITAL_BASED_OUTPATIENT_CLINIC_OR_DEPARTMENT_OTHER): Payer: Self-pay

## 2015-12-03 DIAGNOSIS — C169 Malignant neoplasm of stomach, unspecified: Secondary | ICD-10-CM

## 2015-12-03 DIAGNOSIS — C786 Secondary malignant neoplasm of retroperitoneum and peritoneum: Secondary | ICD-10-CM

## 2015-12-03 DIAGNOSIS — C801 Malignant (primary) neoplasm, unspecified: Secondary | ICD-10-CM

## 2015-12-03 LAB — CEA: CEA1: 54.8 ng/mL — AB (ref 0.0–4.7)

## 2015-12-04 ENCOUNTER — Ambulatory Visit (HOSPITAL_BASED_OUTPATIENT_CLINIC_OR_DEPARTMENT_OTHER): Payer: BLUE CROSS/BLUE SHIELD

## 2015-12-04 ENCOUNTER — Ambulatory Visit: Payer: BLUE CROSS/BLUE SHIELD

## 2015-12-04 VITALS — BP 134/65 | HR 66 | Temp 97.9°F | Resp 18

## 2015-12-04 DIAGNOSIS — C169 Malignant neoplasm of stomach, unspecified: Secondary | ICD-10-CM | POA: Diagnosis not present

## 2015-12-04 DIAGNOSIS — C786 Secondary malignant neoplasm of retroperitoneum and peritoneum: Secondary | ICD-10-CM | POA: Diagnosis not present

## 2015-12-04 DIAGNOSIS — C7800 Secondary malignant neoplasm of unspecified lung: Secondary | ICD-10-CM | POA: Diagnosis not present

## 2015-12-04 DIAGNOSIS — Z5189 Encounter for other specified aftercare: Secondary | ICD-10-CM | POA: Diagnosis not present

## 2015-12-04 MED ORDER — PEGFILGRASTIM INJECTION 6 MG/0.6ML ~~LOC~~
6.0000 mg | PREFILLED_SYRINGE | Freq: Once | SUBCUTANEOUS | Status: AC
  Administered 2015-12-04: 6 mg via SUBCUTANEOUS
  Filled 2015-12-04: qty 0.6

## 2015-12-04 MED ORDER — SODIUM CHLORIDE 0.9 % IJ SOLN
10.0000 mL | INTRAMUSCULAR | Status: DC | PRN
  Administered 2015-12-04: 10 mL
  Filled 2015-12-04: qty 10

## 2015-12-04 MED ORDER — SODIUM CHLORIDE 0.9 % IJ SOLN
10.0000 mL | Freq: Once | INTRAMUSCULAR | Status: AC
  Administered 2015-12-03: 10 mL via INTRAVENOUS
  Filled 2015-12-04: qty 10

## 2015-12-04 MED ORDER — HEPARIN SOD (PORK) LOCK FLUSH 100 UNIT/ML IV SOLN
500.0000 [IU] | Freq: Once | INTRAVENOUS | Status: AC | PRN
  Administered 2015-12-04: 500 [IU]
  Filled 2015-12-04: qty 5

## 2015-12-04 NOTE — Progress Notes (Signed)
Injection of Neulasta was given in Infusion Room

## 2015-12-04 NOTE — Progress Notes (Signed)
LATE ENTRY: Patient walked back into the infusion room at [facility] with his port-a-cath needle hanging out of his chest. Patient had stopped the 5-FU ambulatory pump. This nurse checked the 5-FU pump and clamps. Reaccessed port-a-cath without any problems. Good blood return noted. 5-FU was restarted via ambulatory pump without difficulty. Clamps unlocked, retaped and connections tightened and checked before patient left. 5-FU pump was infusing when patient left [facility].  Per patient, the ambulatory pump was stopped for a total of 35 minutes. Instructed patient to call [place] if he had any other problems or concerns. Patient verbalized understanding.

## 2015-12-09 ENCOUNTER — Encounter: Payer: Self-pay | Admitting: Hematology

## 2015-12-15 ENCOUNTER — Telehealth: Payer: Self-pay | Admitting: *Deleted

## 2015-12-15 ENCOUNTER — Other Ambulatory Visit: Payer: Self-pay | Admitting: Hematology

## 2015-12-15 DIAGNOSIS — C169 Malignant neoplasm of stomach, unspecified: Secondary | ICD-10-CM

## 2015-12-15 NOTE — Telephone Encounter (Signed)
Called patient and let him know that Dr. Burr Medico has ordered his paracentesis for 8/10, so hopefully he will get a call from the schedulers soon

## 2015-12-16 ENCOUNTER — Ambulatory Visit: Payer: BLUE CROSS/BLUE SHIELD

## 2015-12-16 ENCOUNTER — Ambulatory Visit (HOSPITAL_BASED_OUTPATIENT_CLINIC_OR_DEPARTMENT_OTHER): Payer: BLUE CROSS/BLUE SHIELD

## 2015-12-16 ENCOUNTER — Other Ambulatory Visit (HOSPITAL_BASED_OUTPATIENT_CLINIC_OR_DEPARTMENT_OTHER): Payer: BLUE CROSS/BLUE SHIELD

## 2015-12-16 VITALS — BP 133/76 | HR 65 | Temp 98.5°F | Resp 20

## 2015-12-16 DIAGNOSIS — C7800 Secondary malignant neoplasm of unspecified lung: Secondary | ICD-10-CM | POA: Diagnosis not present

## 2015-12-16 DIAGNOSIS — C786 Secondary malignant neoplasm of retroperitoneum and peritoneum: Secondary | ICD-10-CM | POA: Diagnosis not present

## 2015-12-16 DIAGNOSIS — C801 Malignant (primary) neoplasm, unspecified: Secondary | ICD-10-CM

## 2015-12-16 DIAGNOSIS — C169 Malignant neoplasm of stomach, unspecified: Secondary | ICD-10-CM

## 2015-12-16 DIAGNOSIS — Z5111 Encounter for antineoplastic chemotherapy: Secondary | ICD-10-CM

## 2015-12-16 LAB — CBC WITH DIFFERENTIAL/PLATELET
BASO%: 0.1 % (ref 0.0–2.0)
BASOS ABS: 0 10*3/uL (ref 0.0–0.1)
EOS%: 1.3 % (ref 0.0–7.0)
Eosinophils Absolute: 0.1 10*3/uL (ref 0.0–0.5)
HCT: 36.2 % — ABNORMAL LOW (ref 38.4–49.9)
HEMOGLOBIN: 11.5 g/dL — AB (ref 13.0–17.1)
LYMPH%: 17.3 % (ref 14.0–49.0)
MCH: 27.8 pg (ref 27.2–33.4)
MCHC: 31.8 g/dL — ABNORMAL LOW (ref 32.0–36.0)
MCV: 87.4 fL (ref 79.3–98.0)
MONO#: 0.8 10*3/uL (ref 0.1–0.9)
MONO%: 11.5 % (ref 0.0–14.0)
NEUT#: 4.7 10*3/uL (ref 1.5–6.5)
NEUT%: 69.8 % (ref 39.0–75.0)
PLATELETS: 217 10*3/uL (ref 140–400)
RBC: 4.14 10*6/uL — ABNORMAL LOW (ref 4.20–5.82)
RDW: 16.3 % — AB (ref 11.0–14.6)
WBC: 6.8 10*3/uL (ref 4.0–10.3)
lymph#: 1.2 10*3/uL (ref 0.9–3.3)

## 2015-12-16 LAB — COMPREHENSIVE METABOLIC PANEL
ALBUMIN: 2.8 g/dL — AB (ref 3.5–5.0)
ALK PHOS: 119 U/L (ref 40–150)
ALT: 16 U/L (ref 0–55)
ANION GAP: 8 meq/L (ref 3–11)
AST: 18 U/L (ref 5–34)
BILIRUBIN TOTAL: 0.51 mg/dL (ref 0.20–1.20)
BUN: 15.5 mg/dL (ref 7.0–26.0)
CO2: 26 mEq/L (ref 22–29)
Calcium: 9 mg/dL (ref 8.4–10.4)
Chloride: 107 mEq/L (ref 98–109)
Creatinine: 0.8 mg/dL (ref 0.7–1.3)
GLUCOSE: 119 mg/dL (ref 70–140)
POTASSIUM: 3.6 meq/L (ref 3.5–5.1)
SODIUM: 141 meq/L (ref 136–145)
TOTAL PROTEIN: 6.1 g/dL — AB (ref 6.4–8.3)

## 2015-12-16 MED ORDER — DEXTROSE 5 % IV SOLN
400.0000 mg/m2 | Freq: Once | INTRAVENOUS | Status: AC
  Administered 2015-12-16: 996 mg via INTRAVENOUS
  Filled 2015-12-16: qty 49.8

## 2015-12-16 MED ORDER — SODIUM CHLORIDE 0.9 % IV SOLN
Freq: Once | INTRAVENOUS | Status: AC
  Administered 2015-12-16: 11:00:00 via INTRAVENOUS
  Filled 2015-12-16: qty 8

## 2015-12-16 MED ORDER — PROCHLORPERAZINE MALEATE 10 MG PO TABS
10.0000 mg | ORAL_TABLET | Freq: Once | ORAL | Status: AC
  Administered 2015-12-16: 10 mg via ORAL

## 2015-12-16 MED ORDER — IRINOTECAN HCL CHEMO INJECTION 100 MG/5ML
180.0000 mg/m2 | Freq: Once | INTRAVENOUS | Status: AC
  Administered 2015-12-16: 440 mg via INTRAVENOUS
  Filled 2015-12-16: qty 15

## 2015-12-16 MED ORDER — SODIUM CHLORIDE 0.9 % IV SOLN
2200.0000 mg/m2 | INTRAVENOUS | Status: DC
  Administered 2015-12-16: 5500 mg via INTRAVENOUS
  Filled 2015-12-16: qty 110

## 2015-12-16 MED ORDER — HEPARIN SOD (PORK) LOCK FLUSH 100 UNIT/ML IV SOLN
500.0000 [IU] | Freq: Once | INTRAVENOUS | Status: DC | PRN
Start: 2015-12-16 — End: 2015-12-16
  Filled 2015-12-16: qty 5

## 2015-12-16 MED ORDER — PROCHLORPERAZINE MALEATE 10 MG PO TABS
ORAL_TABLET | ORAL | Status: AC
  Filled 2015-12-16: qty 1

## 2015-12-16 MED ORDER — SODIUM CHLORIDE 0.9% FLUSH
10.0000 mL | INTRAVENOUS | Status: DC | PRN
  Administered 2015-12-16: 10 mL via INTRAVENOUS
  Filled 2015-12-16: qty 10

## 2015-12-16 MED ORDER — SODIUM CHLORIDE 0.9 % IJ SOLN
10.0000 mL | INTRAMUSCULAR | Status: DC | PRN
  Filled 2015-12-16: qty 10

## 2015-12-16 MED ORDER — SODIUM CHLORIDE 0.9 % IV SOLN
Freq: Once | INTRAVENOUS | Status: AC
  Administered 2015-12-16: 11:00:00 via INTRAVENOUS

## 2015-12-16 NOTE — Patient Instructions (Signed)
[facility] Discharge Instructions for Patients Receiving Chemotherapy  Today you received the following chemotherapy agents: Irinotecan , Leucovorin and fluorouracil pump.  To help prevent nausea and vomiting after your treatment, we encourage you to take your nausea medication  If you develop nausea and vomiting that is not controlled by your nausea medication, call the clinic.   BELOW ARE SYMPTOMS THAT SHOULD BE REPORTED IMMEDIATELY:  *FEVER GREATER THAN 100.5 F  *CHILLS WITH OR WITHOUT FEVER  NAUSEA AND VOMITING THAT IS NOT CONTROLLED WITH YOUR NAUSEA MEDICATION  *UNUSUAL SHORTNESS OF BREATH  *UNUSUAL BRUISING OR BLEEDING  TENDERNESS IN MOUTH AND THROAT WITH OR WITHOUT PRESENCE OF ULCERS  *URINARY PROBLEMS  *BOWEL PROBLEMS  UNUSUAL RASH Items with * indicate a potential emergency and should be followed up as soon as possible.  Feel free to call the clinic you have any questions or concerns. The clinic phone number is [phone].  Please show the [place] at check-in to the Emergency Department and triage nurse.

## 2015-12-18 ENCOUNTER — Ambulatory Visit (HOSPITAL_COMMUNITY)
Admission: RE | Admit: 2015-12-18 | Discharge: 2015-12-18 | Disposition: A | Discharge status: Home / Self Care | Payer: BLUE CROSS/BLUE SHIELD | Source: Ambulatory Visit | Attending: Hematology | Admitting: Hematology

## 2015-12-18 ENCOUNTER — Ambulatory Visit (HOSPITAL_BASED_OUTPATIENT_CLINIC_OR_DEPARTMENT_OTHER): Payer: BLUE CROSS/BLUE SHIELD

## 2015-12-18 VITALS — BP 127/63 | HR 65 | Temp 97.7°F | Resp 18

## 2015-12-18 DIAGNOSIS — C169 Malignant neoplasm of stomach, unspecified: Secondary | ICD-10-CM | POA: Diagnosis not present

## 2015-12-18 DIAGNOSIS — C7800 Secondary malignant neoplasm of unspecified lung: Secondary | ICD-10-CM | POA: Diagnosis not present

## 2015-12-18 DIAGNOSIS — R188 Other ascites: Secondary | ICD-10-CM | POA: Insufficient documentation

## 2015-12-18 DIAGNOSIS — C786 Secondary malignant neoplasm of retroperitoneum and peritoneum: Secondary | ICD-10-CM | POA: Diagnosis not present

## 2015-12-18 MED ORDER — SODIUM CHLORIDE 0.9 % IJ SOLN
10.0000 mL | INTRAMUSCULAR | Status: DC | PRN
  Administered 2015-12-18: 10 mL
  Filled 2015-12-18: qty 10

## 2015-12-18 MED ORDER — HEPARIN SOD (PORK) LOCK FLUSH 100 UNIT/ML IV SOLN
500.0000 [IU] | Freq: Once | INTRAVENOUS | Status: AC | PRN
  Administered 2015-12-18: 500 [IU]
  Filled 2015-12-18: qty 5

## 2015-12-18 MED ORDER — LIDOCAINE HCL (PF) 1 % IJ SOLN
INTRAMUSCULAR | Status: AC
  Filled 2015-12-18: qty 10

## 2015-12-18 MED ORDER — PEGFILGRASTIM INJECTION 6 MG/0.6ML ~~LOC~~
6.0000 mg | PREFILLED_SYRINGE | Freq: Once | SUBCUTANEOUS | Status: AC
  Administered 2015-12-18: 6 mg via SUBCUTANEOUS
  Filled 2015-12-18: qty 0.6

## 2015-12-18 NOTE — Procedures (Signed)
Successful US guided paracentesis from RLQ.  Yielded 3.8L of clear yellow fluid.  No immediate complications.  Pt tolerated well.   Specimen was not sent for labs.  Ascencion Dike PA-C 12/18/2015 3:31 PM

## 2015-12-18 NOTE — Patient Instructions (Signed)
Pegfilgrastim injection (Neulasta) What is this medicine?  PEGFILGRASTIM (PEG fil gra stim) is a long-acting granulocyte colony-stimulating factor that stimulates the growth of neutrophils, a type of white blood cell important in the body's fight against infection. It is used to reduce the incidence of fever and infection in patients with certain types of cancer who are receiving chemotherapy that affects the bone marrow, and to increase survival after being exposed to high doses of radiation. This medicine may be used for other purposes; ask your health care provider or pharmacist if you have questions. What should I tell my health care provider before I take this medicine? They need to know if you have any of these conditions: -kidney disease -latex allergy -ongoing radiation therapy -sickle cell disease -skin reactions to acrylic adhesives (On-Body Injector only) -an unusual or allergic reaction to pegfilgrastim, filgrastim, other medicines, foods, dyes, or preservatives -pregnant or trying to get pregnant -breast-feeding How should I use this medicine? This medicine is for injection under the skin. If you get this medicine at home, you will be taught how to prepare and give the pre-filled syringe or how to use the On-body Injector. Refer to the patient Instructions for Use for detailed instructions. Use exactly as directed. Take your medicine at regular intervals. Do not take your medicine more often than directed. It is important that you put your used needles and syringes in a special sharps container. Do not put them in a trash can. If you do not have a sharps container, call your pharmacist or healthcare provider to get one. Talk to your pediatrician regarding the use of this medicine in children. While this drug may be prescribed for selected conditions, precautions do apply. Overdosage: If you think you have taken too much of this medicine contact a poison control center or emergency  room at once. NOTE: This medicine is only for you. Do not share this medicine with others. What if I miss a dose? It is important not to miss your dose. Call your doctor or health care professional if you miss your dose. If you miss a dose due to an On-body Injector failure or leakage, a new dose should be administered as soon as possible using a single prefilled syringe for manual use. What may interact with this medicine? Interactions have not been studied. Give your health care provider a list of all the medicines, herbs, non-prescription drugs, or dietary supplements you use. Also tell them if you smoke, drink alcohol, or use illegal drugs. Some items may interact with your medicine. This list may not describe all possible interactions. Give your health care provider a list of all the medicines, herbs, non-prescription drugs, or dietary supplements you use. Also tell them if you smoke, drink alcohol, or use illegal drugs. Some items may interact with your medicine. What should I watch for while using this medicine? You may need blood work done while you are taking this medicine. If you are going to need a MRI, CT scan, or other procedure, tell your doctor that you are using this medicine (On-Body Injector only). What side effects may I notice from receiving this medicine? Side effects that you should report to your doctor or health care professional as soon as possible: -allergic reactions like skin rash, itching or hives, swelling of the face, lips, or tongue -dizziness -fever -pain, redness, or irritation at site where injected -pinpoint red spots on the skin -red or dark-brown urine -shortness of breath or breathing problems -stomach or side pain,   or pain at the shoulder -swelling -tiredness -trouble passing urine or change in the amount of urine Side effects that usually do not require medical attention (report to your doctor or health care professional if they continue or are  bothersome): -bone pain -muscle pain This list may not describe all possible side effects. Call your doctor for medical advice about side effects. You may report side effects to FDA at 1-800-FDA-1088. Where should I keep my medicine? Keep out of the reach of children. Store pre-filled syringes in a refrigerator between 2 and 8 degrees C (36 and 46 degrees F). Do not freeze. Keep in carton to protect from light. Throw away this medicine if it is left out of the refrigerator for more than 48 hours. Throw away any unused medicine after the expiration date. NOTE: This sheet is a summary. It may not cover all possible information. If you have questions about this medicine, talk to your doctor, pharmacist, or health care provider.    2016, Elsevier/Gold Standard. (2014-05-16 14:30:14)  

## 2015-12-21 ENCOUNTER — Telehealth: Payer: Self-pay | Admitting: Hematology

## 2015-12-21 NOTE — Telephone Encounter (Signed)
S/w pt, advised appt time chgd on 8/22. MD appt added new start time is 8am. Pt verbalized understanding.

## 2015-12-30 ENCOUNTER — Other Ambulatory Visit (HOSPITAL_BASED_OUTPATIENT_CLINIC_OR_DEPARTMENT_OTHER): Payer: BLUE CROSS/BLUE SHIELD

## 2015-12-30 ENCOUNTER — Encounter: Payer: Self-pay | Admitting: Hematology

## 2015-12-30 ENCOUNTER — Other Ambulatory Visit: Payer: BLUE CROSS/BLUE SHIELD

## 2015-12-30 ENCOUNTER — Ambulatory Visit (HOSPITAL_BASED_OUTPATIENT_CLINIC_OR_DEPARTMENT_OTHER): Payer: BLUE CROSS/BLUE SHIELD

## 2015-12-30 ENCOUNTER — Ambulatory Visit (HOSPITAL_BASED_OUTPATIENT_CLINIC_OR_DEPARTMENT_OTHER): Payer: BLUE CROSS/BLUE SHIELD | Admitting: Hematology

## 2015-12-30 VITALS — BP 116/79 | HR 82 | Temp 98.5°F | Resp 18 | Ht 69.0 in | Wt 300.7 lb

## 2015-12-30 DIAGNOSIS — C7800 Secondary malignant neoplasm of unspecified lung: Secondary | ICD-10-CM

## 2015-12-30 DIAGNOSIS — D6481 Anemia due to antineoplastic chemotherapy: Secondary | ICD-10-CM

## 2015-12-30 DIAGNOSIS — C169 Malignant neoplasm of stomach, unspecified: Secondary | ICD-10-CM | POA: Diagnosis not present

## 2015-12-30 DIAGNOSIS — C786 Secondary malignant neoplasm of retroperitoneum and peritoneum: Secondary | ICD-10-CM

## 2015-12-30 DIAGNOSIS — R180 Malignant ascites: Secondary | ICD-10-CM

## 2015-12-30 DIAGNOSIS — R188 Other ascites: Secondary | ICD-10-CM

## 2015-12-30 DIAGNOSIS — C801 Malignant (primary) neoplasm, unspecified: Secondary | ICD-10-CM

## 2015-12-30 DIAGNOSIS — D630 Anemia in neoplastic disease: Secondary | ICD-10-CM

## 2015-12-30 DIAGNOSIS — D509 Iron deficiency anemia, unspecified: Secondary | ICD-10-CM

## 2015-12-30 DIAGNOSIS — Z5111 Encounter for antineoplastic chemotherapy: Secondary | ICD-10-CM

## 2015-12-30 LAB — COMPREHENSIVE METABOLIC PANEL
ALBUMIN: 3.1 g/dL — AB (ref 3.5–5.0)
ALK PHOS: 134 U/L (ref 40–150)
ALT: 17 U/L (ref 0–55)
AST: 17 U/L (ref 5–34)
Anion Gap: 9 mEq/L (ref 3–11)
BILIRUBIN TOTAL: 0.44 mg/dL (ref 0.20–1.20)
BUN: 14.8 mg/dL (ref 7.0–26.0)
CO2: 26 mEq/L (ref 22–29)
CREATININE: 1.1 mg/dL (ref 0.7–1.3)
Calcium: 9.5 mg/dL (ref 8.4–10.4)
Chloride: 106 mEq/L (ref 98–109)
EGFR: 87 mL/min/{1.73_m2} — AB (ref >90)
GLUCOSE: 107 mg/dL (ref 70–140)
Potassium: 4 mEq/L (ref 3.5–5.1)
SODIUM: 142 meq/L (ref 136–145)
TOTAL PROTEIN: 6.8 g/dL (ref 6.4–8.3)

## 2015-12-30 LAB — CBC WITH DIFFERENTIAL/PLATELET
BASO%: 0.8 % (ref 0.0–2.0)
Basophils Absolute: 0.1 10*3/uL (ref 0.0–0.1)
EOS ABS: 0.1 10*3/uL (ref 0.0–0.5)
EOS%: 1.1 % (ref 0.0–7.0)
HCT: 39.1 % (ref 38.4–49.9)
HEMOGLOBIN: 12.5 g/dL — AB (ref 13.0–17.1)
LYMPH%: 16 % (ref 14.0–49.0)
MCH: 27.8 pg (ref 27.2–33.4)
MCHC: 32 g/dL (ref 32.0–36.0)
MCV: 87.1 fL (ref 79.3–98.0)
MONO#: 0.9 10*3/uL (ref 0.1–0.9)
MONO%: 12.8 % (ref 0.0–14.0)
NEUT%: 69.3 % (ref 39.0–75.0)
NEUTROS ABS: 5.1 10*3/uL (ref 1.5–6.5)
Platelets: 246 10*3/uL (ref 140–400)
RBC: 4.49 10*6/uL (ref 4.20–5.82)
RDW: 17 % — AB (ref 11.0–14.6)
WBC: 7.4 10*3/uL (ref 4.0–10.3)
lymph#: 1.2 10*3/uL (ref 0.9–3.3)

## 2015-12-30 LAB — CEA (IN HOUSE-CHCC): CEA (CHCC-In House): 71.81 ng/mL — ABNORMAL HIGH (ref 0.00–5.00)

## 2015-12-30 MED ORDER — SODIUM CHLORIDE 0.9 % IV SOLN
Freq: Once | INTRAVENOUS | Status: AC
  Administered 2015-12-30: 10:00:00 via INTRAVENOUS

## 2015-12-30 MED ORDER — PROCHLORPERAZINE MALEATE 10 MG PO TABS
10.0000 mg | ORAL_TABLET | Freq: Once | ORAL | Status: AC
  Administered 2015-12-30: 10 mg via ORAL

## 2015-12-30 MED ORDER — SODIUM CHLORIDE 0.9 % IV SOLN
2200.0000 mg/m2 | INTRAVENOUS | Status: DC
  Administered 2015-12-30: 5500 mg via INTRAVENOUS
  Filled 2015-12-30: qty 110

## 2015-12-30 MED ORDER — SODIUM CHLORIDE 0.9 % IV SOLN
Freq: Once | INTRAVENOUS | Status: AC
  Administered 2015-12-30: 10:00:00 via INTRAVENOUS
  Filled 2015-12-30: qty 8

## 2015-12-30 MED ORDER — IRINOTECAN HCL CHEMO INJECTION 100 MG/5ML
180.0000 mg/m2 | Freq: Once | INTRAVENOUS | Status: AC
  Administered 2015-12-30: 440 mg via INTRAVENOUS
  Filled 2015-12-30: qty 15

## 2015-12-30 MED ORDER — PROCHLORPERAZINE MALEATE 10 MG PO TABS
ORAL_TABLET | ORAL | Status: AC
  Filled 2015-12-30: qty 1

## 2015-12-30 MED ORDER — LEUCOVORIN CALCIUM INJECTION 350 MG
400.0000 mg/m2 | Freq: Once | INTRAMUSCULAR | Status: AC
  Administered 2015-12-30: 996 mg via INTRAVENOUS
  Filled 2015-12-30: qty 49.8

## 2015-12-30 MED ORDER — ENOXAPARIN SODIUM 150 MG/ML ~~LOC~~ SOLN: 130.0000 mg | Freq: Two times a day (BID) | SUBCUTANEOUS | 1 refills | Status: AC

## 2015-12-30 NOTE — Progress Notes (Signed)
Samuel Durham  [phone] [phone]  Clinic follow-up Note   Patient Care Team: No Pcp Per Patient as PCP - General (General Practice) 12/30/2015  CHIEF COMPLAINTS Follow up metastatic gastric cancer    Gastric adenocarcinoma: Stage IV   04/23/2014 Imaging    CT abdomen showed peritoneal carcinomatosis, ascites, thickning of gastric and GEJ wall, CT chest showed a few small b/l lung nodules.       04/24/2014 Tumor Marker    CEA  5.6, CA19.9 13      04/25/2014 Initial Diagnosis    Gastric adenocarcinoma: Stage IV      04/25/2014 Pathology Results    Stomach mass biopsy showed - ADENOCARCINOMA WITH SIGNET RING FEATURES. ascites cytology (+)      04/25/2014 Procedure    EGD: There was a large, nearly circumferential, ulcerated, clearly malignant mass that stradles the GE junction. The vast bulk of the mass lays in the stomach.       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 I saw him first when he was recently admitted to [facility] He is here for the first office visit after hospital discharge.  He was  admitted on 12/15 with progressive, 4 month history of dysphagia, epigastric abdominal discomfort and bloating, early satiation accompanied by nausea, and postprandial vomiting and diarrhea. He has experienced a 30 lbs weight loss in the process. He has noted increased abdominal girth. Patient denies any shortness of breath, chest pain, GI bleed, hematuria or lower extremity swelling. He  denies heavy alcohol intake. Denies tobacco abuse. Denies risk factors for HIV or hepatitis. He denies any family history of GI malignancies.  CT of the abdomen and pelvis with contrast on 12/15 revealed several pulmonary nodules at the bases bilaterally, profound thickening of the distal esophagus extending beyond the gastroesophageal junction into the proximal stomach,cardia and fundus of the stomach, most evident along the lesser curvature. No pathologic dilatation of small bowel or colon. Extensive upper abdominal and retroperitoneal lymphadenopathy was visualized. Moderate volume of ascites, presumably malignant, was noted. Extensive soft tissue nodularity throughout the omentum, compatible with omental caking and widespread peritoneal metastasis. Additionally, there are other areas of enhancing nodularity along the peritoneal surface. Tiny ventral and umbilical hernias containing predominantly omental fat, including some omental implants. There are no aggressive appearing lytic or blastic lesions noted in the visualized portions of the skeleton.  He underwent an EGD by Dr. Ardis Hughs on 04/25/2014, which showed a large fungating mass that straddles the GE junction and occupying the upper 1/3 part of the stomach. Multiple biopsy was taken which showed adenocarcinoma with signet ring features. He also underwent a paracentesis with 4.5 L fluid is removed on 04/26/2014. Cytology was positive for adenocarcinoma with signet ring features, consistent with metastatic disease.  He was discharged to home on 04/27/2014. He did feel a slightly informant of his abdominal bloating after the paracentesis, but not much improvement other symptoms. He had Mediport placement  and the second paracentesis was again 4 L fluids removal on 05/02/2014. His appetite is low, he drinks ensure 3 bottles a day, some soup and a very little other foods. His energy level is also low, but able to take care of all his daily needs, and move  around with about much difficulty. His abdominal discomfort/pain are relatively controlled by morphine 15 mg at night and tramadol 2-3 times during the day. He denies any fever, chills, cough, chest pain or any in the other discomfort.  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  INTERIM HISTORY: Samuel Durham returns for follow up. He is accompanied by his parents to the clinic today. He is doing well overall, he had paracentesis on July 24 and August 10, with 5L and 3.8L fluids removed. He was followed at [facility] last week, repeated CT scan showed stable disease. He will return in 6 weeks. He has good appetite and energy level, functions very well as usual. No other new complaints.   MEDICAL HISTORY:  Past Medical History:  Diagnosis Date  . gastric ca dx'd 04/2014  . Medical history non-contributory     SURGICAL HISTORY: Past Surgical History:  Procedure Laterality Date  . ESOPHAGOGASTRODUODENOSCOPY (EGD) WITH PROPOFOL N/A 04/25/2014   Procedure: ESOPHAGOGASTRODUODENOSCOPY (EGD) WITH PROPOFOL;  Surgeon: Milus Banister, MD;  Location: [facility];  Service: Endoscopy;  Laterality: N/A;  . SQUAMOUS CELL CARCINOMA EXCISION  approx March 2015   removed from top of head    SOCIAL HISTORY: Social History   Social History  . Marital status: Single    Spouse name: N/A  . Number of children: N/A  . Years of education: N/A   Occupational History  . Not on file.   Social History Main Topics  . Smoking status: Never Smoker  . Smokeless tobacco: Never Used  . Alcohol use No  . Drug use: No  . Sexual activity: Not on file   Other Topics Concern  . Not on file   Social History Narrative   Single, lives alone with pet dog (rescue dog)   Works as Information systems manager; drives; independent   Middle child between #2 sisters-his mother and sisters are very supportive     FAMILY HISTORY: Family History  Problem Relation Age of Onset  . Breast cancer Sister 101   . Hypertension Mother   . Hypertension Father   Maternal aunt breast cancer at age of 30 Maternal cousin ovarian cancer at age of 36 Maternal ancle prostate cancer age of 92   ALLERGIES:  has No Known Allergies.  MEDICATIONS:  Current Outpatient Prescriptions  Medication Sig Dispense Refill  . docusate sodium 100 MG CAPS Take 100 mg by mouth 2 (two) times daily. 10 capsule 0  . enoxaparin (LOVENOX) 150 MG/ML injection Inject 0.87 mLs (130 mg total) into the skin 2 (two) times daily. 60 Syringe 1  . lidocaine-prilocaine (EMLA) cream Apply 1 application topically as needed. 30 g 2  . mirtazapine (REMERON) 15 MG tablet Take 1 tablet (15 mg total) by mouth at bedtime. 30 tablet 1  . morphine (MS CONTIN) 15 MG 12 hr tablet Take 1 tablet (15 mg total) by mouth every 12 (twelve) hours. 60 tablet 0  . ondansetron (ZOFRAN) 4 MG tablet Take 1 tablet (4 mg total) by mouth every 8 (eight) hours as needed for nausea or vomiting. 20 tablet 0  .  pantoprazole (PROTONIX) 40 MG tablet Take 1 tablet (40 mg total) by mouth daily. 30 tablet 0  . prochlorperazine (COMPAZINE) 10 MG tablet Take 1 tablet (10 mg total) by mouth every 6 (six) hours as needed for nausea or vomiting. 30 tablet 3  . traMADol (ULTRAM) 50 MG tablet Take 1-2 tablets (50-100 mg total) by mouth every 6 (six) hours as needed for moderate pain. 30 tablet 0   No current facility-administered medications for this visit.    Facility-Administered Medications Ordered in Other Visits  Medication Dose Route Frequency Provider Last Rate Last Dose  . sodium chloride 0.9 % injection 10 mL  10 mL Intravenous PRN Truitt Merle, MD        REVIEW OF SYSTEMS:   Constitutional: Denies fevers, chills or abnormal night sweats, no recent weight loss Eyes: Denies blurriness of vision, double vision or watery eyes Ears, nose, mouth, throat, and face: Denies  mucositis or sore throat Respiratory: Denies cough, dyspnea or wheezes Cardiovascular: Denies palpitation, chest discomfort or lower extremity swelling Gastrointestinal:  Less nausea, abdominal bloating and discomfort Skin: Denies abnormal skin rashes Lymphatics: Denies new lymphadenopathy or easy bruising Neurological:Denies numbness, tingling or new weaknesses Behavioral/Psych: Mood is stable, no new changes  All other systems were reviewed with the patient and are negative except those mentioned in the history.  PHYSICAL EXAMINATION: ECOG PERFORMANCE STATUS: 1 BP 116/79 (BP Location: Left Arm, Patient Position: Sitting)   Pulse 82   Temp 98.5 F (36.9 C) (Oral)   Resp 18   Ht _0  (1.753 m)   Wt (!) 300 lb 11.2 oz (136.4 kg)   SpO2 100%   BMI 44.41 kg/m   GENERAL:alert, no distress and comfortable SKIN: skin color, texture, turgor are normal, no rashes or significant lesions EYES: normal, conjunctiva are pink and non-injected, sclera clear OROPHARYNX:no exudate, no erythema and lips, buccal mucosa, and tongue normal  NECK: supple, thyroid normal size, non-tender, without nodularity LYMPH:  no palpable lymphadenopathy in the cervical, axillary or inguinal LUNGS: clear to auscultation and percussion with normal breathing effort HEART: regular rate & rhythm and no murmurs and no lower extremity edema ABDOMEN:abdomen soft, non-tender and normal bowel sounds, (+) small amount of ascites. Musculoskeletal:no cyanosis of digits and no clubbing  PSYCH: alert & oriented x 3 with fluent speech NEURO: no focal motor/sensory deficits  LABORATORY DATA:  I have reviewed the data as listed CBC Latest Ref Rng & Units 12/30/2015 12/16/2015 12/02/2015  WBC 4.0 - 10.3 10e3/uL 7.4 6.8 6.4  Hemoglobin 13.0 - 17.1 g/dL 12.5(L) 11.5(L) 11.9(L)  Hematocrit 38.4 - 49.9 % 39.1 36.2(L) 36.2(L)  Platelets 140 - 400 10e3/uL 246 217 221      CMP Latest Ref Rng & Units 12/30/2015 12/16/2015 12/02/2015   Glucose 70 - 140 mg/dl 107 119 97  BUN 7.0 - 26.0 mg/dL 14.8 15.5 16.4  Creatinine 0.7 - 1.3 mg/dL 1.1 0.8 0.9  Sodium 136 - 145 mEq/L 142 141 142  Potassium 3.5 - 5.1 mEq/L 4.0 3.6 3.8  Chloride 96 - 112 mEq/L - - -  CO2 22 - 29 mEq/L _1 Calcium 8.4 - 10.4 mg/dL 9.5 9.0 8.9  Total Protein 6.4 - 8.3 g/dL 6.8 6.1(L) 6.1(L)  Total Bilirubin 0.20 - 1.20 mg/dL 0.44 0.51 0.47  Alkaline Phos 40 - 150 U/L 134 119 113  AST 5 - 34 U/L _2 ALT 0 - 55 U/L _3 Results for Samuel Durham, Samuel Durham (  MRN [id number]) as of 12/30/2015 19:04  Ref. Range 09/23/2015 08:35 10/21/2015 08:25 11/04/2015 09:40 12/02/2015 08:51 12/30/2015 08:22  CEA Latest Ref Range: 0.0 - 4.7 ng/mL 20.0 (H) 36.5 (H) 45.7 (H) 54.8 (H)   CEA ([facility]) Latest Ref Range: 0.00 - 5.00 ng/mL     71.81 (H)   Pathology report  Diagnosis  06/07/2014 PERITONEAL/ASCITIC FLUID(SPECIMEN 1 OF 1 COLLECTED 04/26/14): METASTATIC ADENOCARCINOMA WITH SIGNET RING CELL FEATURES.  Stomach, biopsy, r/o cancer 04/25/14 - ADENOCARCINOMA WITH SIGNET RING FEATURES. Microscopic Comment The results were called to Dr. Ardis Hughs on 04/26/2014. (JDP:kh 04/26/14) Claudette Laws MD Pathologist, Electronic Signature (Case signed 04/26/2014)  HER-2/NEU BY CISH - NO AMPLIFICATION OF HER-2 DETECTED. RESULT RATIO OF HER2: CEP 17 SIGNALS 1.20 AVERAGE HER2 COPY NUMBER PER CELL 2.10  ADDITIONAL INFORMATION: Mismatch Repair (MMR) Protein Immunohistochemistry (IHC) IHC Expression Result: MLH1: Preserved nuclear expression (greater 50% tumor expression) MSH2: Preserved nuclear expression (greater 50% tumor expression) MSH6: Preserved nuclear expression (greater 50% tumor expression) PMS2: Preserved nuclear expression (greater 50% tumor expression) * Internal control demonstrates intact nuclear expression Interpretation: NORMAL There is preserved expression of the major and minor MMR proteins. There is a very low probability that microsatellite  instability (MSI) is present. However, certain clinically significant MMR protein mutations may result in preservation of nuclear expression. It is recommended that the preservation of protein expression be correlated with molecular based MSI testing.  [employer] [facility] [employer] testing: (+) RJ73 and NF1   RADIOGRAPHIC STUDIES: I have personally reviewed the radiological images as listed and agreed with the findings in the report  CT chest, abdomen and pelvis with contrast, [facility] 12/22/15 IMPRESSION:  1. New subsegmental pulmonary embolus in the right lower lobe.  2. Stable to slightly decreased size of bilateral pulmonary nodules likely representing metastases.  3. Stable calcified upper abdominal lymphadenopathy and gastric tumor.  4. Extensive omental and peritoneal carcinomatosis, stable.     ASSESSMENT & PLAN:  40 year old gentleman without significant past medical history presents with abdominal bloating, nausea, vomiting, diarrhea, anemia and weight loss. EGD showed a large mass arising from the gastric fundus and extending into GE junction, biopsy showed adenocarcinoma with signet ring features, CT scan is consistent with peritoneal carcinomatosis and bilateral lung metastasis. Ascites cytology was positive for malignant cells.  1. 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 HER-2 negative, MMR-normal -I discussed his surgical biopsy results, cytology results, scan findings with patient and his family members, including his parents and 2 sisters. Unfortunately, this is widely metastatic, not curable, and overall prognosis is very poor.  -His tumor is negative for HER-2 overexpression, no role for trastuzumab. -he had excellent response to first-line chemotherapy FOLFOX, currently on second line FOLFIRI due to disease progression. -I reviewed his recently restaging CT from 12/12/2015 whiich was done at [facility]. Per  report, he has stable disease. No other new lesions. I'll upload his scan to our system for comparison and review. -His tumor marker CEA has been slowly trending up, he has required more paracentesis lately, he likely has slow disease progression. -Giving the stable disease on CT scan, it reasonable to continue his current chemotherapy, but he requires close follow-up. He will return to [facility] in 5 weeks. -Clinical trial options were discussed with patient at [facility], he is being considered for a phase I /2 trial of nivolumab and an experimental drug  -Lab results reviewed with patient, mild stable anemia, otherwise adequate for treatment, we'll  proceed chemotherapy today and continue every 2 weeks -He needs more tumor tissue for clinical trial, I'll arrange biopsy before his next trip to [facility].  2. Recurrent malignant ascites -s/p paracentesis at the beginning of his diagnosis, and again on 03/10/15 and 03/24/15, and 11/23, 12/6 and 1/6.   -His ascites has slightly increased on his recent scan. We'll follow-up clinically and repeat paracentesis if needed.  -We will watch his weight closely, if he has weak and more than 10 pounds, or has significant abdominal bloating, would repeat paracentesis -We'll  Continue monitoring  3. Anemia, multifactorial (iron deficient anemia, cancer related and chemotherapy induced ) -His ferritin level was normal at 171, but he has low iron and saturation, likely some degree of iron deficient anemia. -He has received IV Feraheme 510 mg twice, 2 weeks apart -Blood transfusion as needed during chemotherapy if her hemoglobin below 8. -Mild anemia has been stable.  4. Morbid obesity  -He has great appetite, eats very well, he knows to watch his diet.   Plan -continue chemotherapy FOLFIRI today and every 2 weeks with Neulasta on day 3 - I'll see him back in 4 weeks -I'll set up a ultrasound  guided umbilical hernia tumor biopsy and paracentesis  on next visit, to be done before he returns to [facility] at the end of September  The patient knows to call the clinic with any problems, questions or concerns.  I spent 20 minutes counseling the patient face to face. The total time spent in the appointment was 30  minutes and more than 50% was on counseling    Truitt Merle, MD 12/30/2015

## 2015-12-31 LAB — CEA: CEA1: 73.4 ng/mL — AB (ref 0.0–4.7)

## 2016-01-01 ENCOUNTER — Ambulatory Visit (HOSPITAL_BASED_OUTPATIENT_CLINIC_OR_DEPARTMENT_OTHER): Payer: BLUE CROSS/BLUE SHIELD

## 2016-01-01 VITALS — BP 124/84 | HR 59 | Temp 97.7°F | Resp 18

## 2016-01-01 DIAGNOSIS — Z5189 Encounter for other specified aftercare: Secondary | ICD-10-CM | POA: Diagnosis not present

## 2016-01-01 DIAGNOSIS — C786 Secondary malignant neoplasm of retroperitoneum and peritoneum: Secondary | ICD-10-CM

## 2016-01-01 DIAGNOSIS — C7800 Secondary malignant neoplasm of unspecified lung: Secondary | ICD-10-CM

## 2016-01-01 DIAGNOSIS — C169 Malignant neoplasm of stomach, unspecified: Secondary | ICD-10-CM

## 2016-01-01 MED ORDER — HEPARIN SOD (PORK) LOCK FLUSH 100 UNIT/ML IV SOLN
500.0000 [IU] | Freq: Once | INTRAVENOUS | Status: AC | PRN
  Administered 2016-01-01: 500 [IU]
  Filled 2016-01-01: qty 5

## 2016-01-01 MED ORDER — SODIUM CHLORIDE 0.9 % IJ SOLN
10.0000 mL | INTRAMUSCULAR | Status: DC | PRN
  Administered 2016-01-01: 10 mL
  Filled 2016-01-01: qty 10

## 2016-01-01 MED ORDER — PEGFILGRASTIM INJECTION 6 MG/0.6ML ~~LOC~~
6.0000 mg | PREFILLED_SYRINGE | Freq: Once | SUBCUTANEOUS | Status: AC
  Administered 2016-01-01: 6 mg via SUBCUTANEOUS
  Filled 2016-01-01: qty 0.6

## 2016-01-02 ENCOUNTER — Other Ambulatory Visit: Payer: Self-pay | Admitting: Hematology

## 2016-01-02 ENCOUNTER — Inpatient Hospital Stay
Admission: RE | Admit: 2016-01-02 | Discharge: 2016-01-02 | Disposition: A | Discharge status: Home / Self Care | Payer: Self-pay | Source: Ambulatory Visit | Attending: Hematology | Admitting: Hematology

## 2016-01-02 DIAGNOSIS — C169 Malignant neoplasm of stomach, unspecified: Secondary | ICD-10-CM

## 2016-01-05 ENCOUNTER — Telehealth: Payer: Self-pay | Admitting: Hematology

## 2016-01-05 NOTE — Telephone Encounter (Signed)
Called patient to confirm appt time. V/M is full. Appt ltr & schd mailed. 01/05/16

## 2016-01-13 ENCOUNTER — Ambulatory Visit (HOSPITAL_BASED_OUTPATIENT_CLINIC_OR_DEPARTMENT_OTHER): Payer: BLUE CROSS/BLUE SHIELD

## 2016-01-13 ENCOUNTER — Other Ambulatory Visit (HOSPITAL_BASED_OUTPATIENT_CLINIC_OR_DEPARTMENT_OTHER): Payer: BLUE CROSS/BLUE SHIELD

## 2016-01-13 ENCOUNTER — Ambulatory Visit: Payer: BLUE CROSS/BLUE SHIELD

## 2016-01-13 VITALS — BP 128/70 | HR 68 | Temp 98.3°F | Resp 18

## 2016-01-13 DIAGNOSIS — C7800 Secondary malignant neoplasm of unspecified lung: Secondary | ICD-10-CM

## 2016-01-13 DIAGNOSIS — C169 Malignant neoplasm of stomach, unspecified: Secondary | ICD-10-CM | POA: Diagnosis not present

## 2016-01-13 DIAGNOSIS — C786 Secondary malignant neoplasm of retroperitoneum and peritoneum: Secondary | ICD-10-CM | POA: Diagnosis not present

## 2016-01-13 DIAGNOSIS — C801 Malignant (primary) neoplasm, unspecified: Secondary | ICD-10-CM

## 2016-01-13 DIAGNOSIS — Z5111 Encounter for antineoplastic chemotherapy: Secondary | ICD-10-CM

## 2016-01-13 DIAGNOSIS — D509 Iron deficiency anemia, unspecified: Secondary | ICD-10-CM

## 2016-01-13 LAB — CBC WITH DIFFERENTIAL/PLATELET
BASO%: 0.3 % (ref 0.0–2.0)
BASOS ABS: 0 10*3/uL (ref 0.0–0.1)
EOS ABS: 0.1 10*3/uL (ref 0.0–0.5)
EOS%: 1.6 % (ref 0.0–7.0)
HEMATOCRIT: 37.6 % — AB (ref 38.4–49.9)
HGB: 12.1 g/dL — ABNORMAL LOW (ref 13.0–17.1)
LYMPH#: 1.2 10*3/uL (ref 0.9–3.3)
LYMPH%: 19.2 % (ref 14.0–49.0)
MCH: 27.6 pg (ref 27.2–33.4)
MCHC: 32.2 g/dL (ref 32.0–36.0)
MCV: 85.8 fL (ref 79.3–98.0)
MONO#: 0.8 10*3/uL (ref 0.1–0.9)
MONO%: 11.7 % (ref 0.0–14.0)
NEUT#: 4.3 10*3/uL (ref 1.5–6.5)
NEUT%: 67.2 % (ref 39.0–75.0)
PLATELETS: 242 10*3/uL (ref 140–400)
RBC: 4.38 10*6/uL (ref 4.20–5.82)
RDW: 16 % — ABNORMAL HIGH (ref 11.0–14.6)
WBC: 6.4 10*3/uL (ref 4.0–10.3)

## 2016-01-13 LAB — COMPREHENSIVE METABOLIC PANEL
ALT: 16 U/L (ref 0–55)
ANION GAP: 10 meq/L (ref 3–11)
AST: 18 U/L (ref 5–34)
Albumin: 2.8 g/dL — ABNORMAL LOW (ref 3.5–5.0)
Alkaline Phosphatase: 118 U/L (ref 40–150)
BILIRUBIN TOTAL: 0.45 mg/dL (ref 0.20–1.20)
BUN: 16.2 mg/dL (ref 7.0–26.0)
CALCIUM: 9.1 mg/dL (ref 8.4–10.4)
CHLORIDE: 108 meq/L (ref 98–109)
CO2: 26 meq/L (ref 22–29)
CREATININE: 0.8 mg/dL (ref 0.7–1.3)
EGFR: >90 mL/min/{1.73_m2} (ref >90)
Glucose: 95 mg/dl (ref 70–140)
Potassium: 3.7 mEq/L (ref 3.5–5.1)
Sodium: 144 mEq/L (ref 136–145)
Total Protein: 6.3 g/dL — ABNORMAL LOW (ref 6.4–8.3)

## 2016-01-13 LAB — CEA (IN HOUSE-CHCC): CEA (CHCC-IN HOUSE): 83.42 ng/mL — AB (ref 0.00–5.00)

## 2016-01-13 MED ORDER — IRINOTECAN HCL CHEMO INJECTION 100 MG/5ML
180.0000 mg/m2 | Freq: Once | INTRAVENOUS | Status: AC
  Administered 2016-01-13: 440 mg via INTRAVENOUS
  Filled 2016-01-13: qty 20

## 2016-01-13 MED ORDER — HEPARIN SOD (PORK) LOCK FLUSH 100 UNIT/ML IV SOLN
500.0000 [IU] | Freq: Once | INTRAVENOUS | Status: DC | PRN
Start: 2016-01-13 — End: 2016-01-13
  Filled 2016-01-13: qty 5

## 2016-01-13 MED ORDER — PROCHLORPERAZINE MALEATE 10 MG PO TABS
10.0000 mg | ORAL_TABLET | Freq: Once | ORAL | Status: AC
  Administered 2016-01-13: 10 mg via ORAL

## 2016-01-13 MED ORDER — PROCHLORPERAZINE MALEATE 10 MG PO TABS
ORAL_TABLET | ORAL | Status: AC
  Filled 2016-01-13: qty 1

## 2016-01-13 MED ORDER — LEUCOVORIN CALCIUM INJECTION 350 MG
400.0000 mg/m2 | Freq: Once | INTRAVENOUS | Status: AC
  Administered 2016-01-13: 996 mg via INTRAVENOUS
  Filled 2016-01-13: qty 49.8

## 2016-01-13 MED ORDER — SODIUM CHLORIDE 0.9 % IV SOLN
Freq: Once | INTRAVENOUS | Status: AC
  Administered 2016-01-13: 13:00:00 via INTRAVENOUS

## 2016-01-13 MED ORDER — SODIUM CHLORIDE 0.9 % IV SOLN
Freq: Once | INTRAVENOUS | Status: AC
  Administered 2016-01-13: 13:00:00 via INTRAVENOUS
  Filled 2016-01-13: qty 8

## 2016-01-13 MED ORDER — ALTEPLASE 2 MG IJ SOLR
2.0000 mg | Freq: Once | INTRAMUSCULAR | Status: AC | PRN
  Administered 2016-01-13: 2 mg
  Filled 2016-01-13: qty 2

## 2016-01-13 MED ORDER — SODIUM CHLORIDE 0.9 % IJ SOLN
10.0000 mL | INTRAMUSCULAR | Status: DC | PRN
Start: 2016-01-13 — End: 2016-01-13
  Filled 2016-01-13: qty 10

## 2016-01-13 MED ORDER — SODIUM CHLORIDE 0.9 % IV SOLN
2200.0000 mg/m2 | INTRAVENOUS | Status: DC
  Administered 2016-01-13: 5500 mg via INTRAVENOUS
  Filled 2016-01-13: qty 110

## 2016-01-13 NOTE — Addendum Note (Signed)
Addended by: Truitt Merle on: 01/13/2016 10:36 AM   Modules accepted: Orders

## 2016-01-13 NOTE — Patient Instructions (Signed)
[facility] Discharge Instructions for Patients Receiving Chemotherapy  Today you received the following chemotherapy agents: leucovorin/flourouracil/irinotecan  To help prevent nausea and vomiting after your treatment, we encourage you to take your nausea medication as directed   If you develop nausea and vomiting that is not controlled by your nausea medication, call the clinic.   BELOW ARE SYMPTOMS THAT SHOULD BE REPORTED IMMEDIATELY:  *FEVER GREATER THAN 100.5 F  *CHILLS WITH OR WITHOUT FEVER  NAUSEA AND VOMITING THAT IS NOT CONTROLLED WITH YOUR NAUSEA MEDICATION  *UNUSUAL SHORTNESS OF BREATH  *UNUSUAL BRUISING OR BLEEDING  TENDERNESS IN MOUTH AND THROAT WITH OR WITHOUT PRESENCE OF ULCERS  *URINARY PROBLEMS  *BOWEL PROBLEMS  UNUSUAL RASH Items with * indicate a potential emergency and should be followed up as soon as possible.  Feel free to call the clinic you have any questions or concerns. The clinic phone number is [phone].

## 2016-01-13 NOTE — Progress Notes (Signed)
Pt was taken back to infusion for labs and treatment

## 2016-01-14 ENCOUNTER — Encounter: Payer: Self-pay | Admitting: Hematology

## 2016-01-14 LAB — CEA: CEA1: 88.3 ng/mL — AB (ref 0.0–4.7)

## 2016-01-15 ENCOUNTER — Ambulatory Visit: Payer: BLUE CROSS/BLUE SHIELD | Admitting: Hematology

## 2016-01-15 ENCOUNTER — Ambulatory Visit (HOSPITAL_BASED_OUTPATIENT_CLINIC_OR_DEPARTMENT_OTHER): Payer: BLUE CROSS/BLUE SHIELD

## 2016-01-15 ENCOUNTER — Other Ambulatory Visit: Payer: Self-pay | Admitting: *Deleted

## 2016-01-15 ENCOUNTER — Other Ambulatory Visit: Payer: Self-pay | Admitting: Hematology

## 2016-01-15 ENCOUNTER — Ambulatory Visit: Payer: BLUE CROSS/BLUE SHIELD

## 2016-01-15 VITALS — BP 132/78 | HR 65 | Temp 97.7°F | Resp 16

## 2016-01-15 DIAGNOSIS — C169 Malignant neoplasm of stomach, unspecified: Secondary | ICD-10-CM

## 2016-01-15 MED ORDER — SODIUM CHLORIDE 0.9 % IJ SOLN
10.0000 mL | INTRAMUSCULAR | Status: DC | PRN
  Administered 2016-01-15: 10 mL
  Filled 2016-01-15: qty 10

## 2016-01-15 MED ORDER — PEGFILGRASTIM INJECTION 6 MG/0.6ML ~~LOC~~
6.0000 mg | PREFILLED_SYRINGE | Freq: Once | SUBCUTANEOUS | Status: AC
  Administered 2016-01-15: 6 mg via SUBCUTANEOUS
  Filled 2016-01-15: qty 0.6

## 2016-01-15 MED ORDER — HEPARIN SOD (PORK) LOCK FLUSH 100 UNIT/ML IV SOLN
500.0000 [IU] | Freq: Once | INTRAVENOUS | Status: AC | PRN
  Administered 2016-01-15: 500 [IU]
  Filled 2016-01-15: qty 5

## 2016-01-15 NOTE — Progress Notes (Signed)
Injection given with pump dc appointment.  

## 2016-01-19 ENCOUNTER — Telehealth: Payer: Self-pay | Admitting: Hematology

## 2016-01-19 NOTE — Telephone Encounter (Signed)
Spoke with pt to confirm 9/19 appt date/times

## 2016-01-20 ENCOUNTER — Telehealth: Payer: Self-pay

## 2016-01-20 DIAGNOSIS — R9389 Abnormal findings on diagnostic imaging of other specified body structures: Secondary | ICD-10-CM

## 2016-01-20 NOTE — Telephone Encounter (Signed)
I spoke to [place] and she states we can add pt on for  2:30 pm on 01/23/16.  Omentum biopsy.  EGD scheduled, pt instructed and medications reviewed.  Patient instructions available via My Chart.  Patient to call with any questions or concerns.

## 2016-01-20 NOTE — Telephone Encounter (Signed)
-----   Message from Milus Banister, MD sent at 01/20/2016  7:27 AM EDT ----- Luberta Robertson get him in for repeat EGD.  Probably later this week.  Cashis Rill, See below. He needs EGD later this week. Can you ask Lanelle Bal if I can add on to my Friday afternoon schedule in [place].  I've got two doubles so there should be time.  Also, he needs to stop lovenox for 24 hours prior, Dr. Burr Medico has documented Ok to hold below.  Thanks  DJ  ----- Message ----- From: Truitt Merle, MD Sent: 01/18/2016  10:33 AM To: Milus Banister, MD, Corrie Mckusick, DO, #  [place], thanks for your message.   Dan, could you repeat EGD to get more tumor tissue? He may participate clinical trial at [facility], and they want more tissue. He is on Lovenox for PE which was found in 11/2015, OK to stop a day before procedure if you agree.   Myrtle, please call pt and let him know that his tissue biopsy will be done by endoscopy, see message below from Dr. Earleen Newport. Also please schedule him to see me on 9/19 before his chemo. Ask his date of travel to [facility], and if we need to schedule a paracentesis before he leaves.   Thanks  Krista Blue  ----- Message ----- From: Corrie Mckusick, DO Sent: 01/16/2016  11:19 AM To: Alecia Lemming, Truitt Merle, MD  Dr. Burr Medico:  Just reviewed the biopsy request for your patient, regarding US guided nodule of the umbilicus.    I think there is only fluid in an umbilical hernia, which would be the same as the ascites fluid.    If we need tissue other than what could be acquired from endoscopy, I would suggest CT biopsy of the omentum, where there are changes of omental caking.  That is a tough biopsy, and endo may be more reliable.    Please let us know if you would like [place] to schedule CT biopsy of omentum, or if you think endo would be your preference.   Thanks so much. Corrie Mckusick [phone]

## 2016-01-22 ENCOUNTER — Telehealth: Payer: Self-pay | Admitting: Gastroenterology

## 2016-01-22 NOTE — Telephone Encounter (Signed)
No charge   Krista Blue, he's cancelling the EGD for tomorrow.  FYI.

## 2016-01-23 ENCOUNTER — Encounter: Payer: BLUE CROSS/BLUE SHIELD | Admitting: Gastroenterology

## 2016-01-27 ENCOUNTER — Encounter: Payer: Self-pay | Admitting: Hematology

## 2016-01-27 ENCOUNTER — Ambulatory Visit: Payer: BLUE CROSS/BLUE SHIELD

## 2016-01-27 ENCOUNTER — Encounter: Payer: Self-pay | Admitting: *Deleted

## 2016-01-27 ENCOUNTER — Ambulatory Visit (HOSPITAL_BASED_OUTPATIENT_CLINIC_OR_DEPARTMENT_OTHER): Payer: BLUE CROSS/BLUE SHIELD | Admitting: Hematology

## 2016-01-27 ENCOUNTER — Other Ambulatory Visit (HOSPITAL_BASED_OUTPATIENT_CLINIC_OR_DEPARTMENT_OTHER): Payer: BLUE CROSS/BLUE SHIELD

## 2016-01-27 ENCOUNTER — Ambulatory Visit (HOSPITAL_BASED_OUTPATIENT_CLINIC_OR_DEPARTMENT_OTHER): Payer: BLUE CROSS/BLUE SHIELD

## 2016-01-27 VITALS — BP 146/73 | HR 77 | Temp 98.4°F | Resp 17 | Ht 69.0 in | Wt 302.6 lb

## 2016-01-27 DIAGNOSIS — Z452 Encounter for adjustment and management of vascular access device: Secondary | ICD-10-CM | POA: Diagnosis not present

## 2016-01-27 DIAGNOSIS — C7800 Secondary malignant neoplasm of unspecified lung: Secondary | ICD-10-CM

## 2016-01-27 DIAGNOSIS — C786 Secondary malignant neoplasm of retroperitoneum and peritoneum: Secondary | ICD-10-CM

## 2016-01-27 DIAGNOSIS — Z5111 Encounter for antineoplastic chemotherapy: Secondary | ICD-10-CM

## 2016-01-27 DIAGNOSIS — D630 Anemia in neoplastic disease: Secondary | ICD-10-CM

## 2016-01-27 DIAGNOSIS — R188 Other ascites: Secondary | ICD-10-CM

## 2016-01-27 DIAGNOSIS — Z95828 Presence of other vascular implants and grafts: Secondary | ICD-10-CM | POA: Insufficient documentation

## 2016-01-27 DIAGNOSIS — C169 Malignant neoplasm of stomach, unspecified: Secondary | ICD-10-CM

## 2016-01-27 DIAGNOSIS — D509 Iron deficiency anemia, unspecified: Secondary | ICD-10-CM

## 2016-01-27 DIAGNOSIS — R180 Malignant ascites: Secondary | ICD-10-CM

## 2016-01-27 DIAGNOSIS — C801 Malignant (primary) neoplasm, unspecified: Secondary | ICD-10-CM

## 2016-01-27 DIAGNOSIS — D6481 Anemia due to antineoplastic chemotherapy: Secondary | ICD-10-CM

## 2016-01-27 LAB — CBC WITH DIFFERENTIAL/PLATELET
BASO%: 0.2 % (ref 0.0–2.0)
Basophils Absolute: 0 10*3/uL (ref 0.0–0.1)
EOS%: 1.4 % (ref 0.0–7.0)
Eosinophils Absolute: 0.1 10*3/uL (ref 0.0–0.5)
HCT: 38.5 % (ref 38.4–49.9)
HGB: 12.4 g/dL — ABNORMAL LOW (ref 13.0–17.1)
LYMPH%: 17 % (ref 14.0–49.0)
MCH: 27.3 pg (ref 27.2–33.4)
MCHC: 32.2 g/dL (ref 32.0–36.0)
MCV: 84.6 fL (ref 79.3–98.0)
MONO#: 0.9 10*3/uL (ref 0.1–0.9)
MONO%: 15 % — AB (ref 0.0–14.0)
NEUT%: 66.4 % (ref 39.0–75.0)
NEUTROS ABS: 3.9 10*3/uL (ref 1.5–6.5)
PLATELETS: 266 10*3/uL (ref 140–400)
RBC: 4.55 10*6/uL (ref 4.20–5.82)
RDW: 16.1 % — ABNORMAL HIGH (ref 11.0–14.6)
WBC: 5.9 10*3/uL (ref 4.0–10.3)
lymph#: 1 10*3/uL (ref 0.9–3.3)
nRBC: 0 % (ref 0–0)

## 2016-01-27 LAB — COMPREHENSIVE METABOLIC PANEL WITH GFR
ALT: 19 U/L (ref 0–55)
AST: 20 U/L (ref 5–34)
Albumin: 2.9 g/dL — ABNORMAL LOW (ref 3.5–5.0)
Alkaline Phosphatase: 130 U/L (ref 40–150)
Anion Gap: 10 meq/L (ref 3–11)
BUN: 12.6 mg/dL (ref 7.0–26.0)
CO2: 25 meq/L (ref 22–29)
Calcium: 9.1 mg/dL (ref 8.4–10.4)
Chloride: 107 meq/L (ref 98–109)
Creatinine: 0.9 mg/dL (ref 0.7–1.3)
EGFR: >90 ml/min/1.73 m2 (ref >90)
Glucose: 101 mg/dL (ref 70–140)
Potassium: 3.8 meq/L (ref 3.5–5.1)
Sodium: 143 meq/L (ref 136–145)
Total Bilirubin: 0.53 mg/dL (ref 0.20–1.20)
Total Protein: 6.5 g/dL (ref 6.4–8.3)

## 2016-01-27 MED ORDER — SODIUM CHLORIDE 0.9 % IV SOLN
2200.0000 mg/m2 | INTRAVENOUS | Status: DC
  Administered 2016-01-27: 5500 mg via INTRAVENOUS
  Filled 2016-01-27: qty 110

## 2016-01-27 MED ORDER — IRINOTECAN HCL CHEMO INJECTION 100 MG/5ML
180.0000 mg/m2 | Freq: Once | INTRAVENOUS | Status: AC
  Administered 2016-01-27: 440 mg via INTRAVENOUS
  Filled 2016-01-27: qty 20

## 2016-01-27 MED ORDER — SODIUM CHLORIDE 0.9 % IV SOLN
Freq: Once | INTRAVENOUS | Status: AC
  Administered 2016-01-27: 11:00:00 via INTRAVENOUS
  Filled 2016-01-27: qty 8

## 2016-01-27 MED ORDER — SODIUM CHLORIDE 0.9 % IV SOLN
Freq: Once | INTRAVENOUS | Status: AC
  Administered 2016-01-27: 11:00:00 via INTRAVENOUS

## 2016-01-27 MED ORDER — DEXTROSE 5 % IV SOLN
400.0000 mg/m2 | Freq: Once | INTRAVENOUS | Status: AC
  Administered 2016-01-27: 996 mg via INTRAVENOUS
  Filled 2016-01-27: qty 49.8

## 2016-01-27 MED ORDER — ALTEPLASE 2 MG IJ SOLR
2.0000 mg | Freq: Once | INTRAMUSCULAR | Status: DC | PRN
  Filled 2016-01-27: qty 2

## 2016-01-27 MED ORDER — PROCHLORPERAZINE MALEATE 10 MG PO TABS
10.0000 mg | ORAL_TABLET | Freq: Once | ORAL | Status: AC
  Administered 2016-01-27: 10 mg via ORAL

## 2016-01-27 MED ORDER — PROCHLORPERAZINE MALEATE 10 MG PO TABS
ORAL_TABLET | ORAL | Status: AC
  Filled 2016-01-27: qty 1

## 2016-01-27 MED ORDER — ALTEPLASE 2 MG IJ SOLR
2.0000 mg | Freq: Once | INTRAMUSCULAR | Status: AC | PRN
  Administered 2016-01-27: 2 mg
  Filled 2016-01-27: qty 2

## 2016-01-27 MED ORDER — SODIUM CHLORIDE 0.9 % IJ SOLN
10.0000 mL | INTRAMUSCULAR | Status: DC | PRN
  Administered 2016-01-27: 10 mL via INTRAVENOUS
  Filled 2016-01-27: qty 10

## 2016-01-27 NOTE — Addendum Note (Signed)
Addended by: Carlene Coria L on: 01/27/2016 09:12 AM   Modules accepted: Orders

## 2016-01-27 NOTE — Progress Notes (Signed)
Called [facility] ultrasound and spoke with Jenny Reichmann.  Please add cytology for paracentesis for this Friday 01/30/16./Jan Doyle Askew RN

## 2016-01-27 NOTE — Progress Notes (Signed)
[facility]  Telephone:([phone]) [phone] [phone]) [phone]  Clinic follow-up Note   Patient Care Team: No Pcp Per Patient as PCP - General (General Practice) 01/27/2016  CHIEF COMPLAINTS Follow up metastatic gastric cancer    Gastric adenocarcinoma: Stage IV   04/23/2014 Imaging    CT abdomen showed peritoneal carcinomatosis, ascites, thickning of gastric and GEJ wall, CT chest showed a few small b/l lung nodules.       04/24/2014 Tumor Marker    CEA  5.6, CA19.9 13      04/25/2014 Initial Diagnosis    Gastric adenocarcinoma: Stage IV      04/25/2014 Pathology Results    Stomach mass biopsy showed - ADENOCARCINOMA WITH SIGNET RING FEATURES. ascites cytology (+)      04/25/2014 Procedure    EGD: There was a large, nearly circumferential, ulcerated, clearly malignant mass that stradles the GE junction. The vast bulk of the mass lays in the stomach.       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 I saw him first when he was recently admitted to [facility]. He is here for the first office visit after hospital discharge.  He was  admitted on 12/15 with progressive, 4 month history of dysphagia, epigastric abdominal discomfort and bloating, early satiation accompanied by nausea, and postprandial vomiting and diarrhea. He has experienced a 30 lbs weight loss in the process. He has noted increased abdominal girth. Patient denies any shortness of breath, chest pain, GI bleed, hematuria or lower extremity swelling. He  denies heavy alcohol intake. Denies tobacco abuse. Denies risk factors for HIV or hepatitis. He denies any family history of GI malignancies.  CT of the abdomen and pelvis with contrast on 12/15 revealed several pulmonary nodules at the bases bilaterally, profound thickening of the distal esophagus extending beyond the gastroesophageal junction into the proximal stomach,cardia and fundus of the stomach, most evident along the lesser curvature. No pathologic dilatation of small bowel or colon. Extensive upper abdominal and retroperitoneal lymphadenopathy was visualized. Moderate volume of ascites, presumably malignant, was noted. Extensive soft tissue nodularity throughout the omentum, compatible with omental caking and widespread peritoneal metastasis. Additionally, there are other areas of enhancing nodularity along the peritoneal surface. Tiny ventral and umbilical hernias containing predominantly omental fat, including some omental implants. There are no aggressive appearing lytic or blastic lesions noted in the visualized portions of the skeleton.  He underwent an EGD by Dr. Ardis Hughs on 04/25/2014, which showed a large fungating mass that straddles the GE junction and occupying the upper 1/3 part of the stomach. Multiple biopsy was taken which showed adenocarcinoma with signet ring features. He also underwent a paracentesis with 4.5 L fluid is removed on 04/26/2014. Cytology was positive for adenocarcinoma with signet ring features, consistent with metastatic disease.  He was discharged to home on 04/27/2014. He did feel a slightly informant of his abdominal bloating after the paracentesis, but not much improvement other symptoms. He had Mediport placement  and the second paracentesis was again 4 L fluids removal on 05/02/2014. His appetite is low, he drinks ensure 3 bottles a day, some soup and a very little other foods. His energy level is also low, but able to take care of all his daily needs, and move  around with about much difficulty. His abdominal discomfort/pain are relatively controlled by morphine 15 mg at night and tramadol 2-3 times during the day. He denies any fever, chills, cough, chest pain or any in the other discomfort.  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  INTERIM HISTORY: Samuel Durham returns for follow up and chemo. He is accompanied by his parents to the clinic today. He is doing well overall, he noticed mild abdominal bloating and pressure lately, he is scheduled to have paracentesis in 3 days. He is going back to [facility] next Monday for follow-up and a restaging CT scan, and probably peritoneal mass biopsy. He otherwise feels well, denies any other new symptoms. His weight is stable.   MEDICAL HISTORY:  Past Medical History:  Diagnosis Date  . gastric ca dx'd 04/2014  . Medical history non-contributory     SURGICAL HISTORY: Past Surgical History:  Procedure Laterality Date  . ESOPHAGOGASTRODUODENOSCOPY (EGD) WITH PROPOFOL N/A 04/25/2014   Procedure: ESOPHAGOGASTRODUODENOSCOPY (EGD) WITH PROPOFOL;  Surgeon: Milus Banister, MD;  Location: [facility];  Service: Endoscopy;  Laterality: N/A;  . SQUAMOUS CELL CARCINOMA EXCISION  approx March 2015   removed from top of head    SOCIAL HISTORY: Social History   Social History  . Marital status: Single    Spouse name: N/A  . Number of children: N/A  . Years of education: N/A   Occupational History  . Not on file.   Social History Main Topics  . Smoking status: Never Smoker  . Smokeless tobacco: Never Used  . Alcohol use No  . Drug use: No  . Sexual activity: Not on file   Other Topics Concern  . Not on file   Social History Narrative   Single, lives alone with pet dog (rescue dog)   Works as Information systems manager; drives; independent   Middle child between #2 sisters-his mother and  sisters are very supportive    FAMILY HISTORY: Family History  Problem Relation Age of Onset  . Breast cancer Sister 59   . Hypertension Mother   . Hypertension Father   Maternal aunt breast cancer at age of 41 Maternal cousin ovarian cancer at age of 57 Maternal ancle prostate cancer age of 70   ALLERGIES:  has No Known Allergies.  MEDICATIONS:  Current Outpatient Prescriptions  Medication Sig Dispense Refill  . docusate sodium 100 MG CAPS Take 100 mg by mouth 2 (two) times daily. 10 capsule 0  . enoxaparin (LOVENOX) 150 MG/ML injection Inject 0.87 mLs (130 mg total) into the skin 2 (two) times daily. 60 Syringe 1  . lidocaine-prilocaine (EMLA) cream Apply 1 application topically as needed. 30 g 2  . mirtazapine (REMERON) 15 MG tablet Take 1 tablet (15 mg total) by mouth at bedtime. 30 tablet 1  . morphine (MS CONTIN) 15 MG 12 hr tablet Take 1 tablet (15 mg total) by mouth every 12 (twelve) hours. 60 tablet 0  . ondansetron (ZOFRAN) 4 MG tablet Take 1 tablet (4 mg total) by mouth every 8 (eight) hours as needed for nausea or vomiting. 20 tablet 0  .  pantoprazole (PROTONIX) 40 MG tablet Take 1 tablet (40 mg total) by mouth daily. 30 tablet 0  . prochlorperazine (COMPAZINE) 10 MG tablet Take 1 tablet (10 mg total) by mouth every 6 (six) hours as needed for nausea or vomiting. 30 tablet 3  . traMADol (ULTRAM) 50 MG tablet Take 1-2 tablets (50-100 mg total) by mouth every 6 (six) hours as needed for moderate pain. 30 tablet 0   Current Facility-Administered Medications  Medication Dose Route Frequency Provider Last Rate Last Dose  . alteplase (CATHFLO ACTIVASE) injection 2 mg  2 mg Intracatheter Once PRN Truitt Merle, MD       Facility-Administered Medications Ordered in Other Visits  Medication Dose Route Frequency Provider Last Rate Last Dose  . sodium chloride 0.9 % injection 10 mL  10 mL Intravenous PRN Truitt Merle, MD      . sodium chloride 0.9 % injection 10 mL  10 mL Intravenous PRN  Truitt Merle, MD   10 mL at 01/27/16 0845    REVIEW OF SYSTEMS:   Constitutional: Denies fevers, chills or abnormal night sweats, no recent weight loss Eyes: Denies blurriness of vision, double vision or watery eyes Ears, nose, mouth, throat, and face: Denies mucositis or sore throat Respiratory: Denies cough, dyspnea or wheezes Cardiovascular: Denies palpitation, chest discomfort or lower extremity swelling Gastrointestinal:  Less nausea, abdominal bloating and discomfort Skin: Denies abnormal skin rashes Lymphatics: Denies new lymphadenopathy or easy bruising Neurological:Denies numbness, tingling or new weaknesses Behavioral/Psych: Mood is stable, no new changes  All other systems were reviewed with the patient and are negative except those mentioned in the history.  PHYSICAL EXAMINATION: ECOG PERFORMANCE STATUS: 1 BP (!) 146/73 (BP Location: Left Arm, Patient Position: Sitting)   Pulse 77   Temp 98.4 F (36.9 C) (Oral)   Resp 17   Ht _0  (1.753 m)   Wt (!) 302 lb 9.6 oz (137.3 kg)   SpO2 100%   BMI 44.69 kg/m   GENERAL:alert, no distress and comfortable SKIN: skin color, texture, turgor are normal, no rashes or significant lesions EYES: normal, conjunctiva are pink and non-injected, sclera clear OROPHARYNX:no exudate, no erythema and lips, buccal mucosa, and tongue normal  NECK: supple, thyroid normal size, non-tender, without nodularity LYMPH:  no palpable lymphadenopathy in the cervical, axillary or inguinal LUNGS: clear to auscultation and percussion with normal breathing effort HEART: regular rate & rhythm and no murmurs and no lower extremity edema ABDOMEN:abdomen soft, non-tender and normal bowel sounds, (+) small amount of ascites. Musculoskeletal:no cyanosis of digits and no clubbing  PSYCH: alert & oriented x 3 with fluent speech NEURO: no focal motor/sensory deficits  LABORATORY DATA:  I have reviewed the data as listed CBC Latest Ref Rng & Units 01/27/2016  01/13/2016 12/30/2015  WBC 4.0 - 10.3 10e3/uL 5.9 6.4 7.4  Hemoglobin 13.0 - 17.1 g/dL 12.4(L) 12.1(L) 12.5(L)  Hematocrit 38.4 - 49.9 % 38.5 37.6(L) 39.1  Platelets 140 - 400 10e3/uL 266 242 246      CMP Latest Ref Rng & Units 01/27/2016 01/13/2016 12/30/2015  Glucose 70 - 140 mg/dl 101 95 107  BUN 7.0 - 26.0 mg/dL 12.6 16.2 14.8  Creatinine 0.7 - 1.3 mg/dL 0.9 0.8 1.1  Sodium 136 - 145 mEq/L 143 144 142  Potassium 3.5 - 5.1 mEq/L 3.8 3.7 4.0  Chloride 96 - 112 mEq/L - - -  CO2 22 - 29 mEq/L _1 Calcium 8.4 - 10.4 mg/dL 9.1 9.1 9.5  Total Protein  6.4 - 8.3 g/dL 6.5 6.3(L) 6.8  Total Bilirubin 0.20 - 1.20 mg/dL 0.53 0.45 0.44  Alkaline Phos 40 - 150 U/L 130 118 134  AST 5 - 34 U/L _0 ALT 0 - 55 U/L _1 Results for ABU, HEAVIN (MRN [id number]) as of 12/30/2015 19:04  Ref. Range 09/23/2015 08:35 10/21/2015 08:25 11/04/2015 09:40 12/02/2015 08:51 12/30/2015 08:22  CEA Latest Ref Range: 0.0 - 4.7 ng/mL 20.0 (H) 36.5 (H) 45.7 (H) 54.8 (H)   CEA ([facility]) Latest Ref Range: 0.00 - 5.00 ng/mL     71.81 (H)   Pathology report  Diagnosis  06/07/2014 PERITONEAL/ASCITIC FLUID(SPECIMEN 1 OF 1 COLLECTED 04/26/14): METASTATIC ADENOCARCINOMA WITH SIGNET RING CELL FEATURES.  Stomach, biopsy, r/o cancer 04/25/14 - ADENOCARCINOMA WITH SIGNET RING FEATURES. Microscopic Comment The results were called to Dr. Ardis Hughs on 04/26/2014. (JDP:kh 04/26/14) Claudette Laws MD Pathologist, Electronic Signature (Case signed 04/26/2014)  HER-2/NEU BY CISH - NO AMPLIFICATION OF HER-2 DETECTED. RESULT RATIO OF HER2: CEP 17 SIGNALS 1.20 AVERAGE HER2 COPY NUMBER PER CELL 2.10  ADDITIONAL INFORMATION: Mismatch Repair (MMR) Protein Immunohistochemistry (IHC) IHC Expression Result: MLH1: Preserved nuclear expression (greater 50% tumor expression) MSH2: Preserved nuclear expression (greater 50% tumor expression) MSH6: Preserved nuclear expression (greater 50% tumor expression) PMS2: Preserved  nuclear expression (greater 50% tumor expression) * Internal control demonstrates intact nuclear expression Interpretation: NORMAL There is preserved expression of the major and minor MMR proteins. There is a very low probability that microsatellite instability (MSI) is present. However, certain clinically significant MMR protein mutations may result in preservation of nuclear expression. It is recommended that the preservation of protein expression be correlated with molecular based MSI testing.  [facility] [employer] testing: (+) BT51 and NF1   RADIOGRAPHIC STUDIES: I have personally reviewed the radiological images as listed and agreed with the findings in the report  CT chest, abdomen and pelvis with contrast, [facility] 12/22/15 IMPRESSION:  1. New subsegmental pulmonary embolus in the right lower lobe.  2. Stable to slightly decreased size of bilateral pulmonary nodules likely representing metastases.  3. Stable calcified upper abdominal lymphadenopathy and gastric tumor.  4. Extensive omental and peritoneal carcinomatosis, stable.     ASSESSMENT & PLAN:  40 year old gentleman without significant past medical history presents with abdominal bloating, nausea, vomiting, diarrhea, anemia and weight loss. EGD showed a large mass arising from the gastric fundus and extending into GE junction, biopsy showed adenocarcinoma with signet ring features, CT scan is consistent with peritoneal carcinomatosis and bilateral lung metastasis. Ascites cytology was positive for malignant cells.  1. 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 HER-2 negative, MMR-normal -I discussed his surgical biopsy results, cytology results, scan findings with patient and his family members, including his parents and 2 sisters. Unfortunately, this is widely metastatic, not curable, and overall prognosis is very poor.  -His tumor is negative for HER-2  overexpression, no role for trastuzumab. -he had excellent response to first-line chemotherapy FOLFOX, currently on second line FOLFIRI due to disease progression. -I reviewed his recently restaging CT from 12/12/2015 whiich was done at [facility]. Per report, he has stable disease. No other new lesions. I'll upload his scan to our system for comparison and review. -His tumor marker CEA has been slowly trending up, he has required more paracentesis lately, he likely has slow disease progression. -Giving the stable disease on CT scan, it reasonable to continue his current chemotherapy, but he requires  close follow-up. He will return to [facility] next week -Clinical trial options were discussed with patient at [facility], he is being considered for a phase I /2 trial of nivolumab and an experimental drug  -Lab results reviewed with patient, mild stable anemia, otherwise adequate for treatment, we'll proceed chemotherapy today and continue every 2 weeks -He needs more tumor tissue for clinical trial, but our IR feels his peritoneal nodules are difficult to biopsy, he may try at [facility].   2. Recurrent malignant ascites -s/p paracentesis at the beginning of his diagnosis, and again on 03/10/15 and 03/24/15, and 11/23, 12/6 and 1/6.   -His ascites has slightly increased on his recent scan. We'll follow-up clinically and repeat paracentesis if needed.  -He is scheduled for repeated paracentesis later this week -We'll  Continue monitoring  3. Anemia, multifactorial (iron deficient anemia, cancer related and chemotherapy induced ) -His ferritin level was normal at 171, but he has low iron and saturation, likely some degree of iron deficient anemia. -He has received IV Feraheme 510 mg twice, 2 weeks apart -Blood transfusion as needed during chemotherapy if her hemoglobin below 8. -Mild anemia has been stable.  4. Morbid obesity  -He has great appetite, eats very well, he knows to watch his  diet.   Plan -continue chemotherapy FOLFIRI today and every 2 weeks with Neulasta on day 3 - I'll see him back in 2 weeks  -He is going to [facility] for follow up next week  The patient knows to call the clinic with any problems, questions or concerns.  I spent 20 minutes counseling the patient face to face. The total time spent in the appointment was 30  minutes and more than 50% was on counseling    Truitt Merle, MD 01/27/2016

## 2016-01-27 NOTE — Progress Notes (Signed)
Oncology Nurse Navigator Documentation  Oncology Nurse Navigator Flowsheets 01/27/2016  Navigator Location [facility]  Navigator Encounter Type Treatment  Patient Visit Type MedOnc  Treatment Phase Active Tx  Barriers/Navigation Needs No barriers at this time;No Questions;No Needs  Interventions None required  Support Groups/Services -  Acuity -  Time Spent with Patient 15  Met with Dory and his father in tx area. He is feeling well and continues to tolerate treatment well. Found to have a blood clot at [facility] and is now on Lovenox (no copay). Tells RN that family are celebrating the arrival of a new nephew this week. Continues to have positive attitude, still working and tells RN that life is good. Excellent family support.

## 2016-01-27 NOTE — Patient Instructions (Signed)
[facility] Discharge Instructions for Patients Receiving Chemotherapy  Today you received the following chemotherapy agents: leucovorin/flourouracil/irinotecan  To help prevent nausea and vomiting after your treatment, we encourage you to take your nausea medication as directed   If you develop nausea and vomiting that is not controlled by your nausea medication, call the clinic.   BELOW ARE SYMPTOMS THAT SHOULD BE REPORTED IMMEDIATELY:  *FEVER GREATER THAN 100.5 F  *CHILLS WITH OR WITHOUT FEVER  NAUSEA AND VOMITING THAT IS NOT CONTROLLED WITH YOUR NAUSEA MEDICATION  *UNUSUAL SHORTNESS OF BREATH  *UNUSUAL BRUISING OR BLEEDING  TENDERNESS IN MOUTH AND THROAT WITH OR WITHOUT PRESENCE OF ULCERS  *URINARY PROBLEMS  *BOWEL PROBLEMS  UNUSUAL RASH Items with * indicate a potential emergency and should be followed up as soon as possible.  Feel free to call the clinic you have any questions or concerns. The clinic phone number is [phone].

## 2016-01-29 ENCOUNTER — Ambulatory Visit (HOSPITAL_BASED_OUTPATIENT_CLINIC_OR_DEPARTMENT_OTHER): Payer: BLUE CROSS/BLUE SHIELD

## 2016-01-29 VITALS — BP 129/75 | HR 63 | Temp 97.6°F | Resp 17

## 2016-01-29 DIAGNOSIS — C169 Malignant neoplasm of stomach, unspecified: Secondary | ICD-10-CM

## 2016-01-29 DIAGNOSIS — Z5189 Encounter for other specified aftercare: Secondary | ICD-10-CM | POA: Diagnosis not present

## 2016-01-29 MED ORDER — SODIUM CHLORIDE 0.9 % IJ SOLN
10.0000 mL | INTRAMUSCULAR | Status: DC | PRN
  Administered 2016-01-29: 10 mL
  Filled 2016-01-29: qty 10

## 2016-01-29 MED ORDER — HEPARIN SOD (PORK) LOCK FLUSH 100 UNIT/ML IV SOLN
500.0000 [IU] | Freq: Once | INTRAVENOUS | Status: AC | PRN
  Administered 2016-01-29: 500 [IU]
  Filled 2016-01-29: qty 5

## 2016-01-29 MED ORDER — PEGFILGRASTIM INJECTION 6 MG/0.6ML ~~LOC~~
6.0000 mg | PREFILLED_SYRINGE | Freq: Once | SUBCUTANEOUS | Status: AC
  Administered 2016-01-29: 6 mg via SUBCUTANEOUS
  Filled 2016-01-29: qty 0.6

## 2016-01-29 NOTE — Patient Instructions (Signed)
Pegfilgrastim injection (Neulasta) What is this medicine?  PEGFILGRASTIM (PEG fil gra stim) is a long-acting granulocyte colony-stimulating factor that stimulates the growth of neutrophils, a type of white blood cell important in the body's fight against infection. It is used to reduce the incidence of fever and infection in patients with certain types of cancer who are receiving chemotherapy that affects the bone marrow, and to increase survival after being exposed to high doses of radiation. This medicine may be used for other purposes; ask your health care provider or pharmacist if you have questions. What should I tell my health care provider before I take this medicine? They need to know if you have any of these conditions: -kidney disease -latex allergy -ongoing radiation therapy -sickle cell disease -skin reactions to acrylic adhesives (On-Body Injector only) -an unusual or allergic reaction to pegfilgrastim, filgrastim, other medicines, foods, dyes, or preservatives -pregnant or trying to get pregnant -breast-feeding How should I use this medicine? This medicine is for injection under the skin. If you get this medicine at home, you will be taught how to prepare and give the pre-filled syringe or how to use the On-body Injector. Refer to the patient Instructions for Use for detailed instructions. Use exactly as directed. Take your medicine at regular intervals. Do not take your medicine more often than directed. It is important that you put your used needles and syringes in a special sharps container. Do not put them in a trash can. If you do not have a sharps container, call your pharmacist or healthcare provider to get one. Talk to your pediatrician regarding the use of this medicine in children. While this drug may be prescribed for selected conditions, precautions do apply. Overdosage: If you think you have taken too much of this medicine contact a poison control center or emergency  room at once. NOTE: This medicine is only for you. Do not share this medicine with others. What if I miss a dose? It is important not to miss your dose. Call your doctor or health care professional if you miss your dose. If you miss a dose due to an On-body Injector failure or leakage, a new dose should be administered as soon as possible using a single prefilled syringe for manual use. What may interact with this medicine? Interactions have not been studied. Give your health care provider a list of all the medicines, herbs, non-prescription drugs, or dietary supplements you use. Also tell them if you smoke, drink alcohol, or use illegal drugs. Some items may interact with your medicine. This list may not describe all possible interactions. Give your health care provider a list of all the medicines, herbs, non-prescription drugs, or dietary supplements you use. Also tell them if you smoke, drink alcohol, or use illegal drugs. Some items may interact with your medicine. What should I watch for while using this medicine? You may need blood work done while you are taking this medicine. If you are going to need a MRI, CT scan, or other procedure, tell your doctor that you are using this medicine (On-Body Injector only). What side effects may I notice from receiving this medicine? Side effects that you should report to your doctor or health care professional as soon as possible: -allergic reactions like skin rash, itching or hives, swelling of the face, lips, or tongue -dizziness -fever -pain, redness, or irritation at site where injected -pinpoint red spots on the skin -red or dark-brown urine -shortness of breath or breathing problems -stomach or side pain,   or pain at the shoulder -swelling -tiredness -trouble passing urine or change in the amount of urine Side effects that usually do not require medical attention (report to your doctor or health care professional if they continue or are  bothersome): -bone pain -muscle pain This list may not describe all possible side effects. Call your doctor for medical advice about side effects. You may report side effects to FDA at 1-800-FDA-1088. Where should I keep my medicine? Keep out of the reach of children. Store pre-filled syringes in a refrigerator between 2 and 8 degrees C (36 and 46 degrees F). Do not freeze. Keep in carton to protect from light. Throw away this medicine if it is left out of the refrigerator for more than 48 hours. Throw away any unused medicine after the expiration date. NOTE: This sheet is a summary. It may not cover all possible information. If you have questions about this medicine, talk to your doctor, pharmacist, or health care provider.    2016, Elsevier/Gold Standard. (2014-05-16 14:30:14)  

## 2016-01-30 ENCOUNTER — Ambulatory Visit (HOSPITAL_COMMUNITY)
Admission: RE | Admit: 2016-01-30 | Discharge: 2016-01-30 | Disposition: A | Discharge status: Home / Self Care | Payer: BLUE CROSS/BLUE SHIELD | Source: Ambulatory Visit | Attending: Hematology | Admitting: Hematology

## 2016-01-30 DIAGNOSIS — R188 Other ascites: Secondary | ICD-10-CM | POA: Insufficient documentation

## 2016-01-30 DIAGNOSIS — C169 Malignant neoplasm of stomach, unspecified: Secondary | ICD-10-CM | POA: Diagnosis present

## 2016-01-30 DIAGNOSIS — C786 Secondary malignant neoplasm of retroperitoneum and peritoneum: Secondary | ICD-10-CM | POA: Insufficient documentation

## 2016-01-30 NOTE — Procedures (Signed)
Ultrasound-guided diagnostic and therapeutic paracentesis performed yielding 5 liters (maximum ordered) of amber colored fluid. No immediate complications. A portion of the fluid was sent to the lab for cytology.

## 2016-02-04 ENCOUNTER — Telehealth: Payer: Self-pay | Admitting: Hematology

## 2016-02-04 ENCOUNTER — Encounter: Payer: Self-pay | Admitting: Hematology

## 2016-02-04 NOTE — Telephone Encounter (Signed)
Pt called after leaving a My Chart message.  He states that he is heading home from [facility] & [facility] would like to put his chemo on hold due to concern of rising CEA & more fluid.  They suggested changing regimens with 3 options.  One-Immunotherapy trial but needs to be off chemo x 3 wks & Two -New drug just approved for gastric CA on Fri by FDA.  Three- another drug-but not sure of name but this option would be on the back burner for now.  He states that MD is waiting on biopsy report.  Note to Dr Burr Medico to call pt @ [phone] tomorrow.

## 2016-02-04 NOTE — Telephone Encounter (Signed)
SPOKE WITH PATIENT RE NEXT APPOINTMENT FOR 10/3. PER PATIENT HE WOULD LIKE TO KEEP LAB/FU BUT CX TX. PATIENT FORWARDED TO DESK NURSE.

## 2016-02-09 ENCOUNTER — Other Ambulatory Visit: Payer: BLUE CROSS/BLUE SHIELD

## 2016-02-09 ENCOUNTER — Ambulatory Visit: Payer: BLUE CROSS/BLUE SHIELD | Admitting: Hematology

## 2016-02-10 ENCOUNTER — Inpatient Hospital Stay
Admission: RE | Admit: 2016-02-10 | Discharge: 2016-02-10 | Disposition: A | Discharge status: Home / Self Care | Payer: Self-pay | Source: Ambulatory Visit | Attending: Hematology | Admitting: Hematology

## 2016-02-10 ENCOUNTER — Ambulatory Visit (HOSPITAL_BASED_OUTPATIENT_CLINIC_OR_DEPARTMENT_OTHER): Payer: BLUE CROSS/BLUE SHIELD | Admitting: Hematology

## 2016-02-10 ENCOUNTER — Other Ambulatory Visit: Payer: BLUE CROSS/BLUE SHIELD

## 2016-02-10 ENCOUNTER — Other Ambulatory Visit: Payer: Self-pay | Admitting: Hematology

## 2016-02-10 ENCOUNTER — Telehealth: Payer: Self-pay | Admitting: Hematology

## 2016-02-10 ENCOUNTER — Encounter: Payer: Self-pay | Admitting: Hematology

## 2016-02-10 ENCOUNTER — Ambulatory Visit: Payer: BLUE CROSS/BLUE SHIELD

## 2016-02-10 VITALS — BP 125/63 | HR 80 | Temp 98.0°F | Resp 17 | Ht 69.0 in | Wt 289.0 lb

## 2016-02-10 DIAGNOSIS — C801 Malignant (primary) neoplasm, unspecified: Secondary | ICD-10-CM

## 2016-02-10 DIAGNOSIS — C786 Secondary malignant neoplasm of retroperitoneum and peritoneum: Secondary | ICD-10-CM | POA: Diagnosis not present

## 2016-02-10 DIAGNOSIS — D630 Anemia in neoplastic disease: Secondary | ICD-10-CM

## 2016-02-10 DIAGNOSIS — R188 Other ascites: Secondary | ICD-10-CM

## 2016-02-10 DIAGNOSIS — D509 Iron deficiency anemia, unspecified: Secondary | ICD-10-CM

## 2016-02-10 DIAGNOSIS — C169 Malignant neoplasm of stomach, unspecified: Secondary | ICD-10-CM | POA: Diagnosis not present

## 2016-02-10 DIAGNOSIS — C7800 Secondary malignant neoplasm of unspecified lung: Secondary | ICD-10-CM | POA: Diagnosis not present

## 2016-02-10 DIAGNOSIS — D6481 Anemia due to antineoplastic chemotherapy: Secondary | ICD-10-CM

## 2016-02-10 DIAGNOSIS — R180 Malignant ascites: Secondary | ICD-10-CM

## 2016-02-10 NOTE — Telephone Encounter (Signed)
Spoke with patient re lab/fu 10/24. [facility] radi[facility] will call re paracentesis.

## 2016-02-10 NOTE — Progress Notes (Signed)
[place]  [phone] [phone]  Clinic follow-up Note   Patient Care Team: No Pcp Per Patient as PCP - General (General Practice) 02/10/2016  CHIEF COMPLAINTS Follow up metastatic gastric cancer  Oncology History   Gastric adenocarcinoma: Stage IV   Staging form: Stomach, AJCC 7th Edition   - Clinical: Stage IV (TX, NX, M1) - Unsigned      Gastric adenocarcinoma: Stage IV   04/23/2014 Imaging    CT abdomen showed peritoneal carcinomatosis, ascites, thickning of gastric and GEJ wall, CT chest showed a few small b/l lung nodules.       04/24/2014 Tumor Marker    CEA  5.6, CA19.9 13      04/25/2014 Initial Diagnosis    Gastric adenocarcinoma: Stage IV      04/25/2014 Pathology Results    Stomach mass biopsy showed - ADENOCARCINOMA WITH SIGNET RING FEATURES. ascites cytology (+)      04/25/2014 Procedure    EGD: There was a large, nearly circumferential, ulcerated, clearly malignant mass that stradles the GE junction. The vast bulk of the mass lays in the stomach.       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 I saw him first when he was recently admitted to [facility]. He is here for the first office visit after hospital discharge.  He was  admitted on 12/15 with progressive, 4 month history of dysphagia, epigastric abdominal discomfort and bloating, early satiation accompanied by nausea, and postprandial  vomiting and diarrhea. He has experienced a 30 lbs weight loss in the process. He has noted increased abdominal girth. Patient denies any shortness of breath, chest pain, GI bleed, hematuria or lower extremity swelling. He denies heavy alcohol intake. Denies tobacco abuse. Denies risk factors for HIV or hepatitis. He denies any family history of GI malignancies.  CT of the abdomen and pelvis with contrast on 12/15 revealed several pulmonary nodules at the bases bilaterally, profound thickening of the distal esophagus extending beyond the gastroesophageal junction into the proximal stomach,cardia and fundus of the stomach, most evident along the lesser curvature. No pathologic dilatation of small bowel or colon. Extensive upper abdominal and retroperitoneal lymphadenopathy was visualized. Moderate volume of ascites, presumably malignant, was noted. Extensive soft tissue nodularity throughout the omentum, compatible with omental caking and widespread peritoneal metastasis. Additionally, there are other areas of enhancing nodularity along the peritoneal surface. Tiny ventral and umbilical hernias containing predominantly omental fat, including some omental implants. There are no aggressive appearing lytic or blastic lesions noted in the visualized portions of the skeleton.  He underwent an EGD by Dr. Ardis Hughs on 04/25/2014, which showed a large fungating mass that straddles the GE junction and occupying the upper 1/3 part of the stomach. Multiple biopsy was taken which showed adenocarcinoma with signet ring features. He also underwent a paracentesis with 4.5 L fluid is removed on 04/26/2014. Cytology was positive for adenocarcinoma  with signet ring features, consistent with metastatic disease.  He was discharged to home on 04/27/2014. He did feel a slightly informant of his abdominal bloating after the paracentesis, but not much improvement other symptoms. He had Mediport placement and the second paracentesis  was again 4 L fluids removal on 05/02/2014. His appetite is low, he drinks ensure 3 bottles a day, some soup and a very little other foods. His energy level is also low, but able to take care of all his daily needs, and move around with about much difficulty. His abdominal discomfort/pain are relatively controlled by morphine 15 mg at night and tramadol 2-3 times during the day. He denies any fever, chills, cough, chest pain or any in the other discomfort.  [employer] genetic test (06/03/5807): NF1 splice site [id number] del 47 TP53 R213*   CURRENT THERAPY: FOLFIRI every 2 weeks, started on 03/25/2015, stopped after 01/27/2016 due to slow disease progression   INTERIM HISTORY: Samuel Durham returns for follow up. He went to [facility] a few weeks ago for follow-up, and underwent peritoneum mass biopsy there. He was advised to stop chemotherapy, due to disease progression, and consider clinical trial, immunotherapy with Keytruda, or third line chemotherapy. He feels well overall, but diffusely his ascites has accumulated again lately. No pain or other complains.    MEDICAL HISTORY:  Past Medical History:  Diagnosis Date  . gastric ca dx'd 04/2014  . Medical history non-contributory     SURGICAL HISTORY: Past Surgical History:  Procedure Laterality Date  . ESOPHAGOGASTRODUODENOSCOPY (EGD) WITH PROPOFOL N/A 04/25/2014   Procedure: ESOPHAGOGASTRODUODENOSCOPY (EGD) WITH PROPOFOL;  Surgeon: Milus Banister, MD;  Location: [facility];  Service: Endoscopy;  Laterality: N/A;  . SQUAMOUS CELL CARCINOMA EXCISION  approx March 2015   removed from top of head    SOCIAL HISTORY: Social History   Social History  . Marital status: Single    Spouse name: N/A  . Number of children: N/A  . Years of education: N/A   Occupational History  . Not on file.   Social History Main Topics  . Smoking status: Never Smoker  . Smokeless tobacco: Never Used  . Alcohol use No  . Drug use: No  . Sexual  activity: Not on file   Other Topics Concern  . Not on file   Social History Narrative   Single, lives alone with pet dog (rescue dog)   Works as Information systems manager; drives; independent   Middle child between #2 sisters-his mother and sisters are very supportive    FAMILY HISTORY: Family History  Problem Relation Age of Onset  . Breast cancer Sister 77   . Hypertension Mother   . Hypertension Father   Maternal aunt breast cancer at age of 63 Maternal cousin ovarian cancer at age of 42 Maternal ancle prostate cancer age of 35   ALLERGIES:  has No Known Allergies.  MEDICATIONS:  Current Outpatient Prescriptions  Medication Sig Dispense Refill  . docusate sodium 100 MG CAPS Take 100 mg by mouth 2 (two) times daily. 10 capsule 0  . enoxaparin (LOVENOX) 150 MG/ML injection Inject 0.87 mLs (130 mg total) into the skin 2 (two) times daily. 60 Syringe 1  . lidocaine-prilocaine (EMLA) cream Apply 1 application topically as needed. 30 g 2  . mirtazapine (REMERON) 15 MG tablet Take 1 tablet (15 mg total) by mouth at bedtime. 30 tablet 1  . morphine (MS CONTIN) 15 MG 12 hr tablet Take 1 tablet (15 mg total) by  mouth every 12 (twelve) hours. 60 tablet 0  . ondansetron (ZOFRAN) 4 MG tablet Take 1 tablet (4 mg total) by mouth every 8 (eight) hours as needed for nausea or vomiting. 20 tablet 0  . pantoprazole (PROTONIX) 40 MG tablet Take 1 tablet (40 mg total) by mouth daily. 30 tablet 0  . prochlorperazine (COMPAZINE) 10 MG tablet Take 1 tablet (10 mg total) by mouth every 6 (six) hours as needed for nausea or vomiting. 30 tablet 3  . traMADol (ULTRAM) 50 MG tablet Take 1-2 tablets (50-100 mg total) by mouth every 6 (six) hours as needed for moderate pain. 30 tablet 0   No current facility-administered medications for this visit.    Facility-Administered Medications Ordered in Other Visits  Medication Dose Route Frequency Provider Last Rate Last Dose  . sodium chloride 0.9 % injection 10  mL  10 mL Intravenous PRN Truitt Merle, MD        REVIEW OF SYSTEMS:   Constitutional: Denies fevers, chills or abnormal night sweats, no recent weight loss Eyes: Denies blurriness of vision, double vision or watery eyes Ears, nose, mouth, throat, and face: Denies mucositis or sore throat Respiratory: Denies cough, dyspnea or wheezes Cardiovascular: Denies palpitation, chest discomfort or lower extremity swelling Gastrointestinal:  Less nausea, abdominal bloating and discomfort Skin: Denies abnormal skin rashes Lymphatics: Denies new lymphadenopathy or easy bruising Neurological:Denies numbness, tingling or new weaknesses Behavioral/Psych: Mood is stable, no new changes  All other systems were reviewed with the patient and are negative except those mentioned in the history.  PHYSICAL EXAMINATION: ECOG PERFORMANCE STATUS: 1 BP 125/63 (BP Location: Left Arm, Patient Position: Sitting)   Pulse 80   Temp 98 F (36.7 C) (Oral)   Resp 17   Ht 5' 9"  (1.753 m)   Wt 289 lb (131.1 kg)   SpO2 98%   BMI 42.68 kg/m   GENERAL:alert, no distress and comfortable SKIN: skin color, texture, turgor are normal, no rashes or significant lesions EYES: normal, conjunctiva are pink and non-injected, sclera clear OROPHARYNX:no exudate, no erythema and lips, buccal mucosa, and tongue normal  NECK: supple, thyroid normal size, non-tender, without nodularity LYMPH:  no palpable lymphadenopathy in the cervical, axillary or inguinal LUNGS: clear to auscultation and percussion with normal breathing effort HEART: regular rate & rhythm and no murmurs and no lower extremity edema ABDOMEN:abdomen soft, non-tender and normal bowel sounds, (+) ascites. Musculoskeletal:no cyanosis of digits and no clubbing  PSYCH: alert & oriented x 3 with fluent speech NEURO: no focal motor/sensory deficits  LABORATORY DATA:  I have reviewed the data as listed CBC Latest Ref Rng & Units 01/27/2016 01/13/2016 12/30/2015  WBC 4.0 -  10.3 10e3/uL 5.9 6.4 7.4  Hemoglobin 13.0 - 17.1 g/dL 12.4(L) 12.1(L) 12.5(L)  Hematocrit 38.4 - 49.9 % 38.5 37.6(L) 39.1  Platelets 140 - 400 10e3/uL 266 242 246      CMP Latest Ref Rng & Units 01/27/2016 01/13/2016 12/30/2015  Glucose 70 - 140 mg/dl 101 95 107  BUN 7.0 - 26.0 mg/dL 12.6 16.2 14.8  Creatinine 0.7 - 1.3 mg/dL 0.9 0.8 1.1  Sodium 136 - 145 mEq/L 143 144 142  Potassium 3.5 - 5.1 mEq/L 3.8 3.7 4.0  Chloride 96 - 112 mEq/L - - -  CO2 22 - 29 mEq/L 25 26 26   Calcium 8.4 - 10.4 mg/dL 9.1 9.1 9.5  Total Protein 6.4 - 8.3 g/dL 6.5 6.3(L) 6.8  Total Bilirubin 0.20 - 1.20 mg/dL 0.53 0.45 0.44  Alkaline  Phos 40 - 150 U/L 130 118 134  AST 5 - 34 U/L 20 18 17   ALT 0 - 55 U/L 19 16 17    Results for DERELL, BRUUN (MRN [id number]) as of 02/10/2016 22:07  Ref. Range 11/04/2015 09:40 12/02/2015 08:51 12/30/2015 08:22 12/30/2015 08:22 01/13/2016 09:13  CEA Latest Ref Range: 0.0 - 4.7 ng/mL 45.7 (H) 54.8 (H)  73.4 (H) 88.3 (H)  CEA ([facility]) Latest Ref Range: 0.00 - 5.00 ng/mL   71.81 (H)  83.42 (H)    Pathology report  Diagnosis  06/07/2014 PERITONEAL/ASCITIC FLUID(SPECIMEN 1 OF 1 COLLECTED 04/26/14): METASTATIC ADENOCARCINOMA WITH SIGNET RING CELL FEATURES.  Stomach, biopsy, r/o cancer 04/25/14 - ADENOCARCINOMA WITH SIGNET RING FEATURES. Microscopic Comment The results were called to Dr. Ardis Hughs on 04/26/2014. (JDP:kh 04/26/14) Claudette Laws MD Pathologist, Electronic Signature (Case signed 04/26/2014)  HER-2/NEU BY CISH - NO AMPLIFICATION OF HER-2 DETECTED. RESULT RATIO OF HER2: CEP 17 SIGNALS 1.20 AVERAGE HER2 COPY NUMBER PER CELL 2.10  ADDITIONAL INFORMATION: Mismatch Repair (MMR) Protein Immunohistochemistry (IHC) IHC Expression Result: MLH1: Preserved nuclear expression (greater 50% tumor expression) MSH2: Preserved nuclear expression (greater 50% tumor expression) MSH6: Preserved nuclear expression (greater 50% tumor expression) PMS2: Preserved nuclear expression  (greater 50% tumor expression) * Internal control demonstrates intact nuclear expression Interpretation: NORMAL There is preserved expression of the major and minor MMR proteins. There is a very low probability that microsatellite instability (MSI) is present. However, certain clinically significant MMR protein mutations may result in preservation of nuclear expression. It is recommended that the preservation of protein expression be correlated with molecular based MSI testing.  [facility] [employer] testing: (+) YQ65 and NF1   RADIOGRAPHIC STUDIES: I have personally reviewed the radiological images as listed and agreed with the findings in the report  CT chest, abdomen and pelvis with contrast, [facility] 02/02/2016 1. Resolved pulmonary embolus. 2. Stable pulmonary metastases. 3. Stable calcified intra-abdominal lymphadenopathy and residual gastric tumor. 4. Stable peritoneal carcinomatosis.  5. Unchanged large ascites, extending into the umbilicus and right inguinal canal.     ASSESSMENT & PLAN:  40 year old gentleman without significant past medical history presents with abdominal bloating, nausea, vomiting, diarrhea, anemia and weight loss. EGD showed a large mass arising from the gastric fundus and extending into GE junction, biopsy showed adenocarcinoma with signet ring features, CT scan is consistent with peritoneal carcinomatosis and bilateral lung metastasis. Ascites cytology was positive for malignant cells.  1. 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 HER-2 negative, MMR-normal -I discussed his surgical biopsy results, cytology results, scan findings with patient and his family members, including his parents and 2 sisters. Unfortunately, this is widely metastatic, not curable, and overall prognosis is very poor.  -His tumor is negative for HER-2 overexpression, no role for trastuzumab. -he had excellent response to  first-line chemotherapy FOLFOX, currently on second line FOLFIRI due to disease progression. -I reviewed his recently restaging CT from 02/02/2016 whiich was done at [facility]. Per report, he has stable disease. However, his tumor marker CEA has been slowly trending up, and he has required more paracentesis lately, he clinically has slow disease progression. -I agree with Dr. Liane Comber at [facility] to change his treatment due to slow disease progression. They offered clinical trial with Nivolumab + ICos agonist vs Pembrolizumab as standard of care (if PDL-1 is positive), or third line chemotherapy with Taxol/Ram. I think at this options are very reasonable, and I encouraged him to participate  in the clinical trial if he is eligible. His tumor is currently being tested for PDL 1, to see if he is a candidate for the trial or Keytruda -If his tumor is negative for PDL 1 expression, I think immunotherapy option of Nivolumab plus ipilumumab is also a good option, based on the CheckMate 032 trial. I discussed with pt.  2. Recurrent malignant ascites -s/p multiple paracentesis  -He has some required more frequent paracentesis. I'll schedule one for him for later this week -We'll  Continue monitoring  3. Anemia, multifactorial (iron deficient anemia, cancer related and chemotherapy induced ) -His ferritin level was normal at 171, but he has low iron and saturation, likely some degree of iron deficient anemia. -He has received IV Feraheme 510 mg twice, 2 weeks apart -Blood transfusion as needed during chemotherapy if her hemoglobin below 8. -Mild anemia has been stable.  4. Morbid obesity  -He has great appetite, eats very well, he knows to watch his diet.   Plan -He is waiting his tumor PD-L1 testing result, to see if he is eligible for clinical trial at [facility], or recently approved Keytruda -Paracentesis later this week -I'll see him back in 3 weeks for follow-up.  The patient knows to  call the clinic with any problems, questions or concerns.  I spent 20 minutes counseling the patient face to face. The total time spent in the appointment was 30  minutes and more than 50% was on counseling    Truitt Merle, MD 02/10/2016

## 2016-02-13 ENCOUNTER — Ambulatory Visit (HOSPITAL_COMMUNITY)
Admission: RE | Admit: 2016-02-13 | Discharge: 2016-02-13 | Disposition: A | Discharge status: Home / Self Care | Payer: BLUE CROSS/BLUE SHIELD | Source: Ambulatory Visit | Attending: Hematology | Admitting: Hematology

## 2016-02-13 DIAGNOSIS — R180 Malignant ascites: Secondary | ICD-10-CM | POA: Insufficient documentation

## 2016-02-13 DIAGNOSIS — C169 Malignant neoplasm of stomach, unspecified: Secondary | ICD-10-CM

## 2016-02-13 NOTE — Procedures (Signed)
Ultrasound-guided  therapeutic paracentesis performed yielding 5 liters (maximum ordered) of hazy, amber colored fluid. No immediate complications.

## 2016-02-16 ENCOUNTER — Ambulatory Visit (HOSPITAL_COMMUNITY): Payer: BLUE CROSS/BLUE SHIELD

## 2016-02-18 ENCOUNTER — Encounter: Payer: Self-pay | Admitting: Hematology

## 2016-02-19 ENCOUNTER — Other Ambulatory Visit: Payer: Self-pay | Admitting: Hematology

## 2016-02-23 ENCOUNTER — Telehealth: Payer: Self-pay | Admitting: Medical Oncology

## 2016-02-23 ENCOUNTER — Other Ambulatory Visit: Payer: Self-pay | Admitting: *Deleted

## 2016-02-23 DIAGNOSIS — C169 Malignant neoplasm of stomach, unspecified: Secondary | ICD-10-CM

## 2016-02-23 NOTE — Telephone Encounter (Signed)
Gave pt appt for paracentesis at 10 am on Wed  02/25/16 at [facility].  Pt voiced understanding.

## 2016-02-23 NOTE — Telephone Encounter (Signed)
He is having a lot of abdominal pressure and requests a paracentesis. Please let him know what Dr Burr Medico decides.  [phone]

## 2016-02-25 ENCOUNTER — Ambulatory Visit (HOSPITAL_COMMUNITY)
Admission: RE | Admit: 2016-02-25 | Discharge: 2016-02-25 | Disposition: A | Discharge status: Home / Self Care | Payer: BLUE CROSS/BLUE SHIELD | Source: Ambulatory Visit | Attending: Hematology | Admitting: Hematology

## 2016-02-25 DIAGNOSIS — C169 Malignant neoplasm of stomach, unspecified: Secondary | ICD-10-CM

## 2016-02-25 DIAGNOSIS — R180 Malignant ascites: Secondary | ICD-10-CM | POA: Insufficient documentation

## 2016-02-25 NOTE — Procedures (Signed)
Ultrasound-guided therapeutic paracentesis performed yielding 5 liters of serosanguineous colored fluid. No immediate complications.  Daiden Coltrane E 02/25/2016 3:35 PM

## 2016-02-26 ENCOUNTER — Telehealth: Payer: Self-pay | Admitting: *Deleted

## 2016-02-26 NOTE — Telephone Encounter (Signed)
"  I need a paracentesis next Tuesday after I see Dr. Burr Medico.  Yesterday they did not remove all the fluid.  I'd like it out before it gets out of hand.  Return number [id number]."

## 2016-02-27 ENCOUNTER — Other Ambulatory Visit: Payer: Self-pay | Admitting: *Deleted

## 2016-02-27 DIAGNOSIS — C801 Malignant (primary) neoplasm, unspecified: Secondary | ICD-10-CM

## 2016-02-27 DIAGNOSIS — R180 Malignant ascites: Secondary | ICD-10-CM

## 2016-02-27 DIAGNOSIS — C786 Secondary malignant neoplasm of retroperitoneum and peritoneum: Secondary | ICD-10-CM

## 2016-02-27 DIAGNOSIS — C169 Malignant neoplasm of stomach, unspecified: Secondary | ICD-10-CM

## 2016-03-01 ENCOUNTER — Telehealth: Payer: Self-pay | Admitting: *Deleted

## 2016-03-01 NOTE — Telephone Encounter (Signed)
TC from [place] this am requesting a paracentesis tomorrow morning (03/02/16)  He has an appt with Dr. Burr Medico @ 8:30 so if it could be in the morning that would be a good.  Please call Jaqai back with time @ [phone].

## 2016-03-02 ENCOUNTER — Ambulatory Visit (HOSPITAL_COMMUNITY): Admission: RE | Admit: 2016-03-02 | Payer: BLUE CROSS/BLUE SHIELD | Source: Ambulatory Visit

## 2016-03-02 ENCOUNTER — Ambulatory Visit (HOSPITAL_COMMUNITY)
Admission: RE | Admit: 2016-03-02 | Discharge: 2016-03-02 | Disposition: A | Discharge status: Home / Self Care | Payer: BLUE CROSS/BLUE SHIELD | Source: Ambulatory Visit | Attending: Hematology | Admitting: Hematology

## 2016-03-02 ENCOUNTER — Other Ambulatory Visit (HOSPITAL_BASED_OUTPATIENT_CLINIC_OR_DEPARTMENT_OTHER): Payer: BLUE CROSS/BLUE SHIELD

## 2016-03-02 ENCOUNTER — Ambulatory Visit (HOSPITAL_BASED_OUTPATIENT_CLINIC_OR_DEPARTMENT_OTHER): Payer: BLUE CROSS/BLUE SHIELD | Admitting: Hematology

## 2016-03-02 VITALS — BP 132/92 | HR 99 | Temp 98.4°F | Resp 18 | Ht 69.0 in | Wt 275.5 lb

## 2016-03-02 DIAGNOSIS — C169 Malignant neoplasm of stomach, unspecified: Secondary | ICD-10-CM

## 2016-03-02 DIAGNOSIS — R180 Malignant ascites: Secondary | ICD-10-CM | POA: Insufficient documentation

## 2016-03-02 DIAGNOSIS — C7800 Secondary malignant neoplasm of unspecified lung: Secondary | ICD-10-CM | POA: Diagnosis not present

## 2016-03-02 DIAGNOSIS — C801 Malignant (primary) neoplasm, unspecified: Secondary | ICD-10-CM | POA: Insufficient documentation

## 2016-03-02 DIAGNOSIS — C786 Secondary malignant neoplasm of retroperitoneum and peritoneum: Secondary | ICD-10-CM

## 2016-03-02 DIAGNOSIS — D6481 Anemia due to antineoplastic chemotherapy: Secondary | ICD-10-CM

## 2016-03-02 DIAGNOSIS — D630 Anemia in neoplastic disease: Secondary | ICD-10-CM

## 2016-03-02 DIAGNOSIS — D509 Iron deficiency anemia, unspecified: Secondary | ICD-10-CM

## 2016-03-02 DIAGNOSIS — R188 Other ascites: Secondary | ICD-10-CM

## 2016-03-02 LAB — COMPREHENSIVE METABOLIC PANEL
ALBUMIN: 2.6 g/dL — AB (ref 3.5–5.0)
ALT: 14 U/L (ref 0–55)
AST: 19 U/L (ref 5–34)
Alkaline Phosphatase: 127 U/L (ref 40–150)
Anion Gap: 12 mEq/L — ABNORMAL HIGH (ref 3–11)
BUN: 14.5 mg/dL (ref 7.0–26.0)
CHLORIDE: 103 meq/L (ref 98–109)
CO2: 26 meq/L (ref 22–29)
Calcium: 9.8 mg/dL (ref 8.4–10.4)
Creatinine: 0.9 mg/dL (ref 0.7–1.3)
EGFR: >90 mL/min/{1.73_m2} (ref >90)
GLUCOSE: 116 mg/dL (ref 70–140)
POTASSIUM: 4.2 meq/L (ref 3.5–5.1)
SODIUM: 141 meq/L (ref 136–145)
Total Bilirubin: 0.61 mg/dL (ref 0.20–1.20)
Total Protein: 7.5 g/dL (ref 6.4–8.3)

## 2016-03-02 LAB — CBC & DIFF AND RETIC
BASO%: 0.3 % (ref 0.0–2.0)
Basophils Absolute: 0 10*3/uL (ref 0.0–0.1)
EOS ABS: 0.1 10*3/uL (ref 0.0–0.5)
EOS%: 0.8 % (ref 0.0–7.0)
HCT: 42.1 % (ref 38.4–49.9)
HEMOGLOBIN: 13.4 g/dL (ref 13.0–17.1)
Immature Retic Fract: 13.2 % — ABNORMAL HIGH (ref 3.00–10.60)
LYMPH%: 11.8 % — ABNORMAL LOW (ref 14.0–49.0)
MCH: 26.3 pg — AB (ref 27.2–33.4)
MCHC: 31.8 g/dL — AB (ref 32.0–36.0)
MCV: 82.5 fL (ref 79.3–98.0)
MONO#: 1.2 10*3/uL — ABNORMAL HIGH (ref 0.1–0.9)
MONO%: 11.8 % (ref 0.0–14.0)
NEUT%: 75.3 % — ABNORMAL HIGH (ref 39.0–75.0)
NEUTROS ABS: 7.8 10*3/uL — AB (ref 1.5–6.5)
Platelets: 360 10*3/uL (ref 140–400)
RBC: 5.1 10*6/uL (ref 4.20–5.82)
RDW: 16.4 % — AB (ref 11.0–14.6)
RETIC %: 1.36 % (ref 0.80–1.80)
Retic Ct Abs: 69.36 10*3/uL (ref 34.80–93.90)
WBC: 10.4 10*3/uL — AB (ref 4.0–10.3)
lymph#: 1.2 10*3/uL (ref 0.9–3.3)

## 2016-03-02 LAB — CEA (IN HOUSE-CHCC): CEA (CHCC-In House): 153.3 ng/mL — ABNORMAL HIGH (ref 0.00–5.00)

## 2016-03-02 NOTE — Progress Notes (Signed)
[place]  [phone] Fax:([phone]) [phone]  Clinic follow-up Note   Patient Care Team: No Pcp Per Patient as PCP - General (General Practice) 03/02/2016  CHIEF COMPLAINTS Follow up metastatic gastric cancer  Oncology History   Gastric adenocarcinoma: Stage IV   Staging form: Stomach, AJCC 7th Edition   - Clinical: Stage IV (TX, NX, M1) - Unsigned      Gastric adenocarcinoma: Stage IV   04/23/2014 Imaging    CT abdomen showed peritoneal carcinomatosis, ascites, thickning of gastric and GEJ wall, CT chest showed a few small b/l lung nodules.       04/24/2014 Tumor Marker    CEA  5.6, CA19.9 13      04/25/2014 Initial Diagnosis    Gastric adenocarcinoma: Stage IV      04/25/2014 Pathology Results    Stomach mass biopsy showed - ADENOCARCINOMA WITH SIGNET RING FEATURES. ascites cytology (+)      04/25/2014 Procedure    EGD: There was a large, nearly circumferential, ulcerated, clearly malignant mass that stradles the GE junction. The vast bulk of the mass lays in the stomach.       05/07/2014 - 03/11/2015 Chemotherapy    mFOLFOX, with neulasta as needed, bolus 5FU omitted from cycle 6 due to cytopenia, stopped due to disease progression       06/03/2014 Miscellaneous    [facility] genetic test (+) for:  NF1 splice site [id number] del 47 TP53 R213*       03/25/2015 - 01/27/2016 Chemotherapy    FOLFIRI, every 2 weeks, tolerated well, stopped due to slow disease progression        HISTORY OF PRESENTING ILLNESS (05/06/2014):  Samuel Durham 40 y.o. 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 I saw him first when he was recently admitted to [facility]. He is here for the first office visit after hospital discharge.  He was  admitted on 12/15 with progressive, 4 month history of dysphagia, epigastric abdominal discomfort and bloating, early satiation accompanied by nausea, and postprandial  vomiting and diarrhea. He has experienced a 30 lbs weight loss in the process. He has noted increased abdominal girth. Patient denies any shortness of breath, chest pain, GI bleed, hematuria or lower extremity swelling. He denies heavy alcohol intake. Denies tobacco abuse. Denies risk factors for HIV or hepatitis. He denies any family history of GI malignancies.  CT of the abdomen and pelvis with contrast on 12/15 revealed several pulmonary nodules at the bases bilaterally, profound thickening of the distal esophagus extending beyond the gastroesophageal junction into the proximal stomach,cardia and fundus of the stomach, most evident along the lesser curvature. No pathologic dilatation of small bowel or colon. Extensive upper abdominal and retroperitoneal lymphadenopathy was visualized. Moderate volume of ascites, presumably malignant, was noted. Extensive soft tissue nodularity throughout the omentum, compatible with omental caking and widespread peritoneal metastasis. Additionally, there are other areas of enhancing nodularity along the peritoneal surface. Tiny ventral and umbilical hernias containing predominantly omental fat, including some omental implants. There are no aggressive appearing lytic or blastic lesions noted in the visualized portions of the skeleton.  He underwent an EGD by Dr. Ardis Hughs on 04/25/2014, which showed a large fungating mass that straddles the GE junction and occupying the upper 1/3 part of the stomach. Multiple biopsy was taken which showed adenocarcinoma with signet ring features. He also underwent a paracentesis with 4.5 L fluid is removed on 04/26/2014. Cytology was positive for adenocarcinoma  with signet ring features, consistent with metastatic disease.  He was discharged to home on 04/27/2014. He did feel a slightly informant of his abdominal bloating after the paracentesis, but not much improvement other symptoms. He had Mediport placement and the second paracentesis  was again 4 L fluids removal on 05/02/2014. His appetite is low, he drinks ensure 3 bottles a day, some soup and a very little other foods. His energy level is also low, but able to take care of all his daily needs, and move around with about much difficulty. His abdominal discomfort/pain are relatively controlled by morphine 15 mg at night and tramadol 2-3 times during the day. He denies any fever, chills, cough, chest pain or any in the other discomfort.  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 He has required more frequent paracentesis daily, once a week on average, with 5 years ascites removal. But he otherwise feels well, denies any significant pain, nausea, or other symptoms. His appetite and energy level remains to be decent. He still waiting his insurance company to approve his participating of a clinical trial at [facility].   MEDICAL HISTORY:  Past Medical History:  Diagnosis Date  . gastric ca dx'd 04/2014  . Medical history non-contributory     SURGICAL HISTORY: Past Surgical History:  Procedure Laterality Date  . ESOPHAGOGASTRODUODENOSCOPY (EGD) WITH PROPOFOL N/A 04/25/2014   Procedure: ESOPHAGOGASTRODUODENOSCOPY (EGD) WITH PROPOFOL;  Surgeon: Milus Banister, MD;  Location: [facility];  Service: Endoscopy;  Laterality: N/A;  . SQUAMOUS CELL CARCINOMA EXCISION  approx March 2015   removed from top of head    SOCIAL HISTORY: Social History   Social History  . Marital status: Single    Spouse name: N/A  . Number of children: N/A  . Years of education: N/A   Occupational History  . Not on file.   Social History Main Topics  . Smoking status: Never Smoker  . Smokeless tobacco: Never Used  . Alcohol use No  . Drug use: No  . Sexual activity: Not on file   Other Topics Concern  . Not on file   Social History Narrative     Single, lives alone with pet dog (rescue dog)   Works as Information systems manager; drives; independent   Middle child between #2 sisters-his mother and sisters are very supportive    FAMILY HISTORY: Family History  Problem Relation Age of Onset  . Breast cancer Sister 27   . Hypertension Mother   . Hypertension Father   Maternal aunt breast cancer at age of 52 Maternal cousin ovarian cancer at age of 57 Maternal ancle prostate cancer age of 27   ALLERGIES:  has No Known Allergies.  MEDICATIONS:  Current Outpatient Prescriptions  Medication Sig Dispense Refill  . docusate sodium 100 MG CAPS Take 100 mg by mouth 2 (two) times daily. 10 capsule 0  . enoxaparin (LOVENOX) 150 MG/ML injection Inject 0.87 mLs (130 mg total) into the skin 2 (two) times daily. 60 Syringe 1  . lidocaine-prilocaine (EMLA) cream Apply 1 application topically as needed. 30 g 2  . mirtazapine (REMERON) 15 MG tablet Take 1 tablet (15 mg total) by mouth at bedtime. 30 tablet 1  . morphine (MS CONTIN) 15 MG 12 hr tablet Take 1 tablet (15 mg total) by mouth every 12 (twelve) hours. 60 tablet 0  .  ondansetron (ZOFRAN) 4 MG tablet Take 1 tablet (4 mg total) by mouth every 8 (eight) hours as needed for nausea or vomiting. 20 tablet 0  . pantoprazole (PROTONIX) 40 MG tablet Take 1 tablet (40 mg total) by mouth daily. 30 tablet 0  . prochlorperazine (COMPAZINE) 10 MG tablet Take 1 tablet (10 mg total) by mouth every 6 (six) hours as needed for nausea or vomiting. 30 tablet 3  . traMADol (ULTRAM) 50 MG tablet Take 1-2 tablets (50-100 mg total) by mouth every 6 (six) hours as needed for moderate pain. 30 tablet 0   No current facility-administered medications for this visit.    Facility-Administered Medications Ordered in Other Visits  Medication Dose Route Frequency Provider Last Rate Last Dose  . sodium chloride 0.9 % injection 10 mL  10 mL Intravenous PRN Truitt Merle, MD        REVIEW OF SYSTEMS:   Constitutional: Denies  fevers, chills or abnormal night sweats, no recent weight loss Eyes: Denies blurriness of vision, double vision or watery eyes Ears, nose, mouth, throat, and face: Denies mucositis or sore throat Respiratory: Denies cough, dyspnea or wheezes Cardiovascular: Denies palpitation, chest discomfort or lower extremity swelling Gastrointestinal:  Less nausea, abdominal bloating and discomfort Skin: Denies abnormal skin rashes Lymphatics: Denies new lymphadenopathy or easy bruising Neurological:Denies numbness, tingling or new weaknesses Behavioral/Psych: Mood is stable, no new changes  All other systems were reviewed with the patient and are negative except those mentioned in the history.  PHYSICAL EXAMINATION: ECOG PERFORMANCE STATUS: 1 BP (!) 132/92 (BP Location: Left Arm, Patient Position: Sitting)   Pulse 99   Temp 98.4 F (36.9 C) (Oral)   Resp 18   Ht _0  (1.753 m)   Wt 275 lb 8 oz (125 kg)   SpO2 97%   BMI 40.68 kg/m   GENERAL:alert, no distress and comfortable SKIN: skin color, texture, turgor are normal, no rashes or significant lesions EYES: normal, conjunctiva are pink and non-injected, sclera clear OROPHARYNX:no exudate, no erythema and lips, buccal mucosa, and tongue normal  NECK: supple, thyroid normal size, non-tender, without nodularity LYMPH:  no palpable lymphadenopathy in the cervical, axillary or inguinal LUNGS: clear to auscultation and percussion with normal breathing effort HEART: regular rate & rhythm and no murmurs and no lower extremity edema ABDOMEN:abdomen soft, non-tender and normal bowel sounds, (+) ascites. Musculoskeletal:no cyanosis of digits and no clubbing  PSYCH: alert & oriented x 3 with fluent speech NEURO: no focal motor/sensory deficits  LABORATORY DATA:  I have reviewed the data as listed CBC Latest Ref Rng & Units 03/02/2016 01/27/2016 01/13/2016  WBC 4.0 - 10.3 10e3/uL 10.4(H) 5.9 6.4  Hemoglobin 13.0 - 17.1 g/dL 13.4 12.4(L) 12.1(L)    Hematocrit 38.4 - 49.9 % 42.1 38.5 37.6(L)  Platelets 140 - 400 10e3/uL 360 266 242      CMP Latest Ref Rng & Units 03/02/2016 01/27/2016 01/13/2016  Glucose 70 - 140 mg/dl 116 101 95  BUN 7.0 - 26.0 mg/dL 14.5 12.6 16.2  Creatinine 0.7 - 1.3 mg/dL 0.9 0.9 0.8  Sodium 136 - 145 mEq/L 141 143 144  Potassium 3.5 - 5.1 mEq/L 4.2 3.8 3.7  Chloride 96 - 112 mEq/L - - -  CO2 22 - 29 mEq/L _1 Calcium 8.4 - 10.4 mg/dL 9.8 9.1 9.1  Total Protein 6.4 - 8.3 g/dL 7.5 6.5 6.3(L)  Total Bilirubin 0.20 - 1.20 mg/dL 0.61 0.53 0.45  Alkaline Phos 40 - 150 U/L 127  130 118  AST 5 - 34 U/L _0 ALT 0 - 55 U/L _1 CEA (0-5.0ng/ml) 04/25/2016: 5.6 08/26/2014: 1.7 10/08/2014: 1.8 02/25/2015: 3.9 05/14/2015: 2.9 08/26/2015: 7.3 11/04/2015: 45.7 12/30/2015: 73.4 03/02/2016: 153.3    Pathology report  Diagnosis  06/07/2014 PERITONEAL/ASCITIC FLUID(SPECIMEN 1 OF 1 COLLECTED 04/26/14): METASTATIC ADENOCARCINOMA WITH SIGNET RING CELL FEATURES.  Stomach, biopsy, r/o cancer 04/25/14 - ADENOCARCINOMA WITH SIGNET RING FEATURES. Microscopic Comment The results were called to Dr. Ardis Hughs on 04/26/2014. (JDP:kh 04/26/14) Claudette Laws MD Pathologist, Electronic Signature (Case signed 04/26/2014)  HER-2/NEU BY CISH - NO AMPLIFICATION OF HER-2 DETECTED. RESULT RATIO OF HER2: CEP 17 SIGNALS 1.20 AVERAGE HER2 COPY NUMBER PER CELL 2.10  ADDITIONAL INFORMATION: Mismatch Repair (MMR) Protein Immunohistochemistry (IHC) IHC Expression Result: MLH1: Preserved nuclear expression (greater 50% tumor expression) MSH2: Preserved nuclear expression (greater 50% tumor expression) MSH6: Preserved nuclear expression (greater 50% tumor expression) PMS2: Preserved nuclear expression (greater 50% tumor expression) * Internal control demonstrates intact nuclear expression Interpretation: NORMAL There is preserved expression of the major and minor MMR proteins. There is a very low probability  that microsatellite instability (MSI) is present. However, certain clinically significant MMR protein mutations may result in preservation of nuclear expression. It is recommended that the preservation of protein expression be correlated with molecular based MSI testing.  [facility] [employer] testing: (+) IO96 and NF1   RADIOGRAPHIC STUDIES: I have personally reviewed the radiological images as listed and agreed with the findings in the report  CT chest, abdomen and pelvis with contrast, [facility] 02/02/2016 1. Resolved pulmonary embolus. 2. Stable pulmonary metastases. 3. Stable calcified intra-abdominal lymphadenopathy and residual gastric tumor. 4. Stable peritoneal carcinomatosis.  5. Unchanged large ascites, extending into the umbilicus and right inguinal canal.     ASSESSMENT & PLAN:  40 year old gentleman without significant past medical history presents with abdominal bloating, nausea, vomiting, diarrhea, anemia and weight loss. EGD showed a large mass arising from the gastric fundus and extending into GE junction, biopsy showed adenocarcinoma with signet ring features, CT scan is consistent with peritoneal carcinomatosis and bilateral lung metastasis. Ascites cytology was positive for malignant cells.  1. 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 HER-2 negative, MMR-normal -I previously discussed his surgical biopsy results, cytology results, scan findings with patient and his family members, including his parents and 2 sisters. Unfortunately, this is widely metastatic, not curable, and overall prognosis is very poor.  -His tumor is negative for HER-2 overexpression, no role for trastuzumab. -he had excellent response to first-line chemotherapy FOLFOX, currently on second line FOLFIRI due to disease progression. -I reviewed his recently restaging CT from 02/02/2016 whiich was done at [facility]. Per report, he has  stable disease. However, his tumor marker CEA has been slowly trending up, and he has required more paracentesis lately, he clinically has slow disease progression. -He maybe is eligible for an immunotherapy clinical trial at [facility], however his insurance company did not. Dr. Hetty Ely has  Appealed and it's under reviewed  -Due to his worsening ascites, I recommended him to consider starting treatment as soon as possible -I spoke with Dr. Hetty Ely after his clinic visit, and she is agreeable to start pt on Taxol and Ramucirumab, will start next week, I spoke with pt, he is agreeable   2. Recurrent malignant ascites -s/p multiple paracentesis  -He has required more frequent paracentesis. I'll schedule weekly for the next few weeks  -We'll  Continue monitoring  3. Anemia, multifactorial (iron deficient anemia, cancer related and chemotherapy induced ) -His ferritin level was normal at 171, but he has low iron and saturation, likely some degree of iron deficient anemia. -He has received IV Feraheme 510 mg twice, 2 weeks apart -Blood transfusion as needed during chemotherapy if her hemoglobin below 8. -Mild anemia has been stable.  4. Morbid obesity  -He has great appetite, eats very well, he knows to watch his diet.   Plan -Paracentesis once a week by interventional radiology -He will start weekly Taxol (3 weeks on, and one week off) and ramucirumab next week   The patient knows to call the clinic with any problems, questions or concerns.  I spent 20 minutes counseling the patient face to face. The total time spent in the appointment was 30  minutes and more than 50% was on counseling    Truitt Merle, MD 03/02/2016

## 2016-03-02 NOTE — Procedures (Signed)
Successful US guided paracentesis from RLQ.  Yielded 5L of hazy, amber colored fluid.  No immediate complications.  Pt tolerated well.   Specimen was not sent for labs.  Ascencion Dike PA-C 03/02/2016 3:28 PM

## 2016-03-04 ENCOUNTER — Telehealth: Payer: Self-pay | Admitting: *Deleted

## 2016-03-04 NOTE — Telephone Encounter (Signed)
Spoke with patient.  Let him know his paracentesis is scheduled for Monday 03/08/16 at 11am at [facility].  Note:  I already can see this appt. In the computer.

## 2016-03-06 ENCOUNTER — Other Ambulatory Visit: Payer: Self-pay

## 2016-03-06 ENCOUNTER — Emergency Department (HOSPITAL_COMMUNITY)
Admission: EM | Admit: 2016-03-06 | Discharge: 2016-03-06 | Disposition: A | Discharge status: Home / Self Care | Payer: BLUE CROSS/BLUE SHIELD | Attending: Emergency Medicine | Admitting: Emergency Medicine

## 2016-03-06 ENCOUNTER — Encounter (HOSPITAL_COMMUNITY): Payer: Self-pay | Admitting: *Deleted

## 2016-03-06 DIAGNOSIS — Z5321 Procedure and treatment not carried out due to patient leaving prior to being seen by health care provider: Secondary | ICD-10-CM | POA: Diagnosis not present

## 2016-03-06 DIAGNOSIS — Z85028 Personal history of other malignant neoplasm of stomach: Secondary | ICD-10-CM | POA: Insufficient documentation

## 2016-03-06 DIAGNOSIS — R0602 Shortness of breath: Secondary | ICD-10-CM | POA: Insufficient documentation

## 2016-03-06 DIAGNOSIS — R109 Unspecified abdominal pain: Secondary | ICD-10-CM | POA: Insufficient documentation

## 2016-03-06 NOTE — ED Notes (Signed)
Pt was asked to change into a gown. Pt ignored request and laid down on bed. Pt's family repeated request, and pt stated he "might in a minute"

## 2016-03-06 NOTE — ED Notes (Addendum)
Pt feels like he doesn't need blood work but just needs the fluid drained from his abdomen Pt refused all labs and doesn't want port accessed Triage RNs notified

## 2016-03-06 NOTE — ED Provider Notes (Signed)
[facility]BS--refused history and exam.  Only wants a paracentesis by IR   Samuel Warbington, MD 03/06/16 2331

## 2016-03-06 NOTE — ED Triage Notes (Signed)
Pt complains of abdominal pain. Pt has hx of stage 4 stomach cancer and has paracentesis to remove fluid from his abdomen. Pt states he is supposed to have fluid removed in 2 days but feels he needs to have it drained sooner due to the pain in his abdomen. Pt has vomited 3 times today, denies diarrhea.

## 2016-03-08 ENCOUNTER — Encounter: Payer: Self-pay | Admitting: Hematology

## 2016-03-08 ENCOUNTER — Encounter: Payer: Self-pay | Admitting: Pharmacist

## 2016-03-08 ENCOUNTER — Ambulatory Visit (HOSPITAL_COMMUNITY)
Admission: RE | Admit: 2016-03-08 | Discharge: 2016-03-08 | Disposition: A | Discharge status: Home / Self Care | Payer: BLUE CROSS/BLUE SHIELD | Source: Ambulatory Visit | Attending: Hematology | Admitting: Hematology

## 2016-03-08 ENCOUNTER — Other Ambulatory Visit: Payer: Self-pay | Admitting: *Deleted

## 2016-03-08 DIAGNOSIS — C169 Malignant neoplasm of stomach, unspecified: Secondary | ICD-10-CM

## 2016-03-08 NOTE — Procedures (Signed)
Ultrasound-guided therapeutic paracentesis performed yielding 4.3 liters of serosanguineous colored fluid. No immediate complications.  Niv Darley E 12:13 PM 03/08/2016

## 2016-03-09 ENCOUNTER — Ambulatory Visit (HOSPITAL_BASED_OUTPATIENT_CLINIC_OR_DEPARTMENT_OTHER): Payer: BLUE CROSS/BLUE SHIELD

## 2016-03-09 ENCOUNTER — Telehealth: Payer: Self-pay | Admitting: *Deleted

## 2016-03-09 ENCOUNTER — Encounter: Payer: Self-pay | Admitting: *Deleted

## 2016-03-09 ENCOUNTER — Encounter: Payer: Self-pay | Admitting: Hematology

## 2016-03-09 ENCOUNTER — Ambulatory Visit: Payer: BLUE CROSS/BLUE SHIELD

## 2016-03-09 ENCOUNTER — Ambulatory Visit (HOSPITAL_BASED_OUTPATIENT_CLINIC_OR_DEPARTMENT_OTHER): Payer: BLUE CROSS/BLUE SHIELD | Admitting: Hematology

## 2016-03-09 VITALS — BP 131/92 | HR 117 | Temp 98.5°F | Resp 18 | Ht 69.0 in | Wt 258.2 lb

## 2016-03-09 VITALS — BP 138/97 | HR 76 | Temp 98.4°F | Resp 20

## 2016-03-09 DIAGNOSIS — C786 Secondary malignant neoplasm of retroperitoneum and peritoneum: Secondary | ICD-10-CM

## 2016-03-09 DIAGNOSIS — D509 Iron deficiency anemia, unspecified: Secondary | ICD-10-CM

## 2016-03-09 DIAGNOSIS — Z95828 Presence of other vascular implants and grafts: Secondary | ICD-10-CM

## 2016-03-09 DIAGNOSIS — Z5111 Encounter for antineoplastic chemotherapy: Secondary | ICD-10-CM

## 2016-03-09 DIAGNOSIS — C7800 Secondary malignant neoplasm of unspecified lung: Secondary | ICD-10-CM | POA: Diagnosis not present

## 2016-03-09 DIAGNOSIS — Z5112 Encounter for antineoplastic immunotherapy: Secondary | ICD-10-CM | POA: Diagnosis not present

## 2016-03-09 DIAGNOSIS — D630 Anemia in neoplastic disease: Secondary | ICD-10-CM | POA: Diagnosis not present

## 2016-03-09 DIAGNOSIS — D508 Other iron deficiency anemias: Secondary | ICD-10-CM

## 2016-03-09 DIAGNOSIS — C169 Malignant neoplasm of stomach, unspecified: Secondary | ICD-10-CM

## 2016-03-09 DIAGNOSIS — D6481 Anemia due to antineoplastic chemotherapy: Secondary | ICD-10-CM

## 2016-03-09 DIAGNOSIS — R109 Unspecified abdominal pain: Secondary | ICD-10-CM

## 2016-03-09 DIAGNOSIS — R180 Malignant ascites: Secondary | ICD-10-CM

## 2016-03-09 DIAGNOSIS — R188 Other ascites: Secondary | ICD-10-CM

## 2016-03-09 LAB — CBC & DIFF AND RETIC
BASO%: 0.2 % (ref 0.0–2.0)
BASOS ABS: 0 10*3/uL (ref 0.0–0.1)
EOS ABS: 0.1 10*3/uL (ref 0.0–0.5)
EOS%: 0.7 % (ref 0.0–7.0)
HEMATOCRIT: 44.4 % (ref 38.4–49.9)
HEMOGLOBIN: 14.2 g/dL (ref 13.0–17.1)
Immature Retic Fract: 19.7 % — ABNORMAL HIGH (ref 3.00–10.60)
LYMPH%: 7.6 % — AB (ref 14.0–49.0)
MCH: 25.9 pg — AB (ref 27.2–33.4)
MCHC: 32 g/dL (ref 32.0–36.0)
MCV: 80.9 fL (ref 79.3–98.0)
MONO#: 1.5 10*3/uL — ABNORMAL HIGH (ref 0.1–0.9)
MONO%: 11.2 % (ref 0.0–14.0)
NEUT#: 10.6 10*3/uL — ABNORMAL HIGH (ref 1.5–6.5)
NEUT%: 80.3 % — AB (ref 39.0–75.0)
PLATELETS: 410 10*3/uL — AB (ref 140–400)
RBC: 5.49 10*6/uL (ref 4.20–5.82)
RDW: 16.2 % — ABNORMAL HIGH (ref 11.0–14.6)
RETIC %: 1.76 % (ref 0.80–1.80)
Retic Ct Abs: 96.62 10*3/uL — ABNORMAL HIGH (ref 34.80–93.90)
WBC: 13.2 10*3/uL — ABNORMAL HIGH (ref 4.0–10.3)
lymph#: 1 10*3/uL (ref 0.9–3.3)

## 2016-03-09 LAB — COMPREHENSIVE METABOLIC PANEL
ALBUMIN: 2.3 g/dL — AB (ref 3.5–5.0)
ALK PHOS: 270 U/L — AB (ref 40–150)
ALT: 49 U/L (ref 0–55)
ANION GAP: 13 meq/L — AB (ref 3–11)
AST: 33 U/L (ref 5–34)
BUN: 22.6 mg/dL (ref 7.0–26.0)
CALCIUM: 9.4 mg/dL (ref 8.4–10.4)
CHLORIDE: 102 meq/L (ref 98–109)
CO2: 23 mEq/L (ref 22–29)
CREATININE: 1 mg/dL (ref 0.7–1.3)
EGFR: 90 mL/min/{1.73_m2} — ABNORMAL LOW (ref >90)
Glucose: 134 mg/dl (ref 70–140)
POTASSIUM: 4.2 meq/L (ref 3.5–5.1)
Sodium: 138 mEq/L (ref 136–145)
Total Bilirubin: 0.67 mg/dL (ref 0.20–1.20)
Total Protein: 7.1 g/dL (ref 6.4–8.3)

## 2016-03-09 LAB — UA PROTEIN, DIPSTICK - CHCC

## 2016-03-09 MED ORDER — SODIUM CHLORIDE 0.9 % IV SOLN
Freq: Once | INTRAVENOUS | Status: AC
  Administered 2016-03-09: 14:00:00 via INTRAVENOUS

## 2016-03-09 MED ORDER — DIPHENHYDRAMINE HCL 50 MG/ML IJ SOLN
INTRAMUSCULAR | Status: AC
  Filled 2016-03-09: qty 1

## 2016-03-09 MED ORDER — DEXAMETHASONE SODIUM PHOSPHATE 10 MG/ML IJ SOLN
INTRAMUSCULAR | Status: AC
  Filled 2016-03-09: qty 1

## 2016-03-09 MED ORDER — ACETAMINOPHEN 325 MG PO TABS
ORAL_TABLET | ORAL | Status: AC
  Filled 2016-03-09: qty 2

## 2016-03-09 MED ORDER — FAMOTIDINE IN NACL 20-0.9 MG/50ML-% IV SOLN
20.0000 mg | Freq: Once | INTRAVENOUS | Status: AC
  Administered 2016-03-09: 20 mg via INTRAVENOUS

## 2016-03-09 MED ORDER — SODIUM CHLORIDE 0.9 % IV SOLN
8.0000 mg/kg | Freq: Once | INTRAVENOUS | Status: AC
  Administered 2016-03-09: 1000 mg via INTRAVENOUS
  Filled 2016-03-09: qty 100

## 2016-03-09 MED ORDER — SODIUM CHLORIDE 0.9 % IJ SOLN
10.0000 mL | INTRAMUSCULAR | Status: DC | PRN
  Administered 2016-03-09: 10 mL via INTRAVENOUS
  Filled 2016-03-09: qty 10

## 2016-03-09 MED ORDER — HEPARIN SOD (PORK) LOCK FLUSH 100 UNIT/ML IV SOLN
500.0000 [IU] | Freq: Once | INTRAVENOUS | Status: AC | PRN
  Administered 2016-03-09: 500 [IU]
  Filled 2016-03-09: qty 5

## 2016-03-09 MED ORDER — PACLITAXEL CHEMO INJECTION 300 MG/50ML
80.0000 mg/m2 | Freq: Once | INTRAVENOUS | Status: AC
  Administered 2016-03-09: 198 mg via INTRAVENOUS
  Filled 2016-03-09: qty 33

## 2016-03-09 MED ORDER — ALTEPLASE 2 MG IJ SOLR
2.0000 mg | Freq: Once | INTRAMUSCULAR | Status: AC | PRN
  Administered 2016-03-09: 2 mg
  Filled 2016-03-09: qty 2

## 2016-03-09 MED ORDER — PROCHLORPERAZINE MALEATE 10 MG PO TABS: 10.0000 mg | ORAL_TABLET | Freq: Four times a day (QID) | ORAL | 3 refills | Status: AC | PRN

## 2016-03-09 MED ORDER — HYDROCODONE-ACETAMINOPHEN 5-325 MG PO TABS: 1.0000 | ORAL_TABLET | Freq: Four times a day (QID) | ORAL | 0 refills | Status: AC | PRN

## 2016-03-09 MED ORDER — FAMOTIDINE IN NACL 20-0.9 MG/50ML-% IV SOLN
INTRAVENOUS | Status: AC
  Filled 2016-03-09: qty 50

## 2016-03-09 MED ORDER — SODIUM CHLORIDE 0.9% FLUSH
10.0000 mL | INTRAVENOUS | Status: DC | PRN
  Administered 2016-03-09: 10 mL
  Filled 2016-03-09: qty 10

## 2016-03-09 MED ORDER — DIPHENHYDRAMINE HCL 50 MG/ML IJ SOLN
50.0000 mg | Freq: Once | INTRAMUSCULAR | Status: AC
  Administered 2016-03-09: 50 mg via INTRAVENOUS

## 2016-03-09 MED ORDER — DEXAMETHASONE SODIUM PHOSPHATE 10 MG/ML IJ SOLN
10.0000 mg | Freq: Once | INTRAMUSCULAR | Status: AC
  Administered 2016-03-09: 10 mg via INTRAVENOUS

## 2016-03-09 MED ORDER — ACETAMINOPHEN 325 MG PO TABS
650.0000 mg | ORAL_TABLET | Freq: Once | ORAL | Status: AC
  Administered 2016-03-09: 650 mg via ORAL

## 2016-03-09 MED ORDER — ONDANSETRON HCL 4 MG PO TABS: 4.0000 mg | ORAL_TABLET | Freq: Three times a day (TID) | ORAL | 0 refills | Status: AC | PRN

## 2016-03-09 NOTE — Telephone Encounter (Signed)
Called IR scheduler & left message to try to schedule Pleurx cath & drainage on this Friday.

## 2016-03-09 NOTE — Patient Instructions (Addendum)
[facility] Discharge Instructions for Patients Receiving Chemotherapy  Today you received the following chemotherapy agents Cyramza and Taxol.   To help prevent nausea and vomiting after your treatment, we encourage you to take your nausea medication as directed.   If you develop nausea and vomiting that is not controlled by your nausea medication, call the clinic.   BELOW ARE SYMPTOMS THAT SHOULD BE REPORTED IMMEDIATELY:  *FEVER GREATER THAN 100.5 F  *CHILLS WITH OR WITHOUT FEVER  NAUSEA AND VOMITING THAT IS NOT CONTROLLED WITH YOUR NAUSEA MEDICATION  *UNUSUAL SHORTNESS OF BREATH  *UNUSUAL BRUISING OR BLEEDING  TENDERNESS IN MOUTH AND THROAT WITH OR WITHOUT PRESENCE OF ULCERS  *URINARY PROBLEMS  *BOWEL PROBLEMS  UNUSUAL RASH Items with * indicate a potential emergency and should be followed up as soon as possible.  Feel free to call the clinic you have any questions or concerns. The clinic phone number is [phone].  Please show the [facility] at check-in to the Emergency Department and triage nurse.   Ramucirumab injection What is this medicine? RAMUCIRUMAB (ra mue SIR ue mab) is a monoclonal antibody. It is used to treat stomach cancer, colorectal cancer, or lung cancer. This medicine may be used for other purposes; ask your health care provider or pharmacist if you have questions. What should I tell my health care provider before I take this medicine? They need to know if you have any of these conditions: -bleeding disorders -blood clots -heart disease, including heart failure, heart attack, or chest pain (angina) -high blood pressure -infection (especially a virus infection such as chickenpox, cold sores, or herpes) -protein in your urine -recent surgery -stroke -an unusual or allergic reaction to ramucirumab, other medicines, foods, dyes, or preservatives -pregnant or trying to get pregnant -breast-feeding How should I use this  medicine? This medicine is for infusion into a vein. It is given by a health care professional in a hospital or clinic setting. Talk to your pediatrician regarding the use of this medicine in children. Special care may be needed. Overdosage: If you think you have taken too much of this medicine contact a poison control center or emergency room at once. NOTE: This medicine is only for you. Do not share this medicine with others. What if I miss a dose? It is important not to miss your dose. Call your doctor or health care professional if you are unable to keep an appointment. What may interact with this medicine? Interactions have not been studied. This list may not describe all possible interactions. Give your health care provider a list of all the medicines, herbs, non-prescription drugs, or dietary supplements you use. Also tell them if you smoke, drink alcohol, or use illegal drugs. Some items may interact with your medicine. What should I watch for while using this medicine? Your condition will be monitored carefully while you are receiving this medicine. You will need to to check your blood pressure and have your blood and urine tested while you are taking this medicine. Your condition will be monitored carefully while you are receiving this medicine. This medicine may increase your risk to bruise or bleed. Call your doctor or health care professional if you notice any unusual bleeding. This medicine may rarely cause 'gastrointestinal perforation' (holes in the stomach, intestines or colon), a serious side effect requiring surgery to repair. This medicine should be started at least 28 days following major surgery and the site of the surgery should be totally healed. Check with  your doctor before scheduling dental work or surgery while you are receiving this treatment. Talk to your doctor if you have recently had surgery or if you have a wound that has not healed. Do not become pregnant while  taking this medicine or for 3 months after stopping it. Women should inform their doctor if they wish to become pregnant or think they might be pregnant. There is a potential for serious side effects to an unborn child. Talk to your health care professional or pharmacist for more information. What side effects may I notice from receiving this medicine? Side effects that you should report to your doctor or health care professional as soon as possible: -allergic reactions like skin rash, itching or hives, breathing problems, swelling of the face, lips, or tongue -signs of infection - fever or chills, cough, sore throat -chest pain or chest tightness -confusion -dizziness -feeling faint or lightheaded, falls -severe abdominal pain -severe nausea, vomiting -signs and symptoms of bleeding such as bloody or black, tarry stools; red or dark-brown urine; spitting up blood or brown material that looks like coffee grounds; red spots on the skin; unusual bruising or bleeding from the eye, gums, or nose -signs and symptoms of a blood clot such as breathing problems; changes in vision; chest pain; severe, sudden headache; pain, swelling, warmth in the leg; trouble speaking; sudden numbness or weakness of the face, arm or leg -symptoms of a stroke: change in mental awareness, inability to talk or move one side of the body -trouble walking, dizziness, loss of balance or coordination Side effects that usually do not require medical attention (Report these to your doctor or health care professional if they continue or are bothersome.): -cold, clammy skin -constipation -diarrhea -headache -nausea, vomiting -stomach pain -unusually slow heartbeat -unusually weak or tired This list may not describe all possible side effects. Call your doctor for medical advice about side effects. You may report side effects to FDA at 1-800-FDA-1088. Where should I keep my medicine? This drug is given in a hospital or clinic  and will not be stored at home. NOTE: This sheet is a summary. It may not cover all possible information. If you have questions about this medicine, talk to your doctor, pharmacist, or health care provider.    2016, [employer]. (2014-06-25 17:37:19)  Paclitaxel injection What is this medicine? PACLITAXEL (PAK li TAX el) is a chemotherapy drug. It targets fast dividing cells, like cancer cells, and causes these cells to die. This medicine is used to treat ovarian cancer, breast cancer, and other cancers. This medicine may be used for other purposes; ask your health care provider or pharmacist if you have questions. What should I tell my health care provider before I take this medicine? They need to know if you have any of these conditions: -blood disorders -irregular heartbeat -infection (especially a virus infection such as chickenpox, cold sores, or herpes) -liver disease -previous or ongoing radiation therapy -an unusual or allergic reaction to paclitaxel, alcohol, polyoxyethylated castor oil, other chemotherapy agents, other medicines, foods, dyes, or preservatives -pregnant or trying to get pregnant -breast-feeding How should I use this medicine? This drug is given as an infusion into a vein. It is administered in a hospital or clinic by a specially trained health care professional. Talk to your pediatrician regarding the use of this medicine in children. Special care may be needed. Overdosage: If you think you have taken too much of this medicine contact a poison control center or emergency room at  once. NOTE: This medicine is only for you. Do not share this medicine with others. What if I miss a dose? It is important not to miss your dose. Call your doctor or health care professional if you are unable to keep an appointment. What may interact with this medicine? Do not take this medicine with any of the following medications: -disulfiram -metronidazole This medicine  may also interact with the following medications: -cyclosporine -diazepam -ketoconazole -medicines to increase blood counts like filgrastim, pegfilgrastim, sargramostim -other chemotherapy drugs like cisplatin, doxorubicin, epirubicin, etoposide, teniposide, vincristine -quinidine -testosterone -vaccines -verapamil Talk to your doctor or health care professional before taking any of these medicines: -acetaminophen -aspirin -ibuprofen -ketoprofen -naproxen This list may not describe all possible interactions. Give your health care provider a list of all the medicines, herbs, non-prescription drugs, or dietary supplements you use. Also tell them if you smoke, drink alcohol, or use illegal drugs. Some items may interact with your medicine. What should I watch for while using this medicine? Your condition will be monitored carefully while you are receiving this medicine. You will need important blood work done while you are taking this medicine. This drug may make you feel generally unwell. This is not uncommon, as chemotherapy can affect healthy cells as well as cancer cells. Report any side effects. Continue your course of treatment even though you feel ill unless your doctor tells you to stop. This medicine can cause serious allergic reactions. To reduce your risk you will need to take other medicine(s) before treatment with this medicine. In some cases, you may be given additional medicines to help with side effects. Follow all directions for their use. Call your doctor or health care professional for advice if you get a fever, chills or sore throat, or other symptoms of a cold or flu. Do not treat yourself. This drug decreases your body's ability to fight infections. Try to avoid being around people who are sick. This medicine may increase your risk to bruise or bleed. Call your doctor or health care professional if you notice any unusual bleeding. Be careful brushing and flossing your teeth  or using a toothpick because you may get an infection or bleed more easily. If you have any dental work done, tell your dentist you are receiving this medicine. Avoid taking products that contain aspirin, acetaminophen, ibuprofen, naproxen, or ketoprofen unless instructed by your doctor. These medicines may hide a fever. Do not become pregnant while taking this medicine. Women should inform their doctor if they wish to become pregnant or think they might be pregnant. There is a potential for serious side effects to an unborn child. Talk to your health care professional or pharmacist for more information. Do not breast-feed an infant while taking this medicine. Men are advised not to father a child while receiving this medicine. This product may contain alcohol. Ask your pharmacist or healthcare provider if this medicine contains alcohol. Be sure to tell all healthcare providers you are taking this medicine. Certain medicines, like metronidazole and disulfiram, can cause an unpleasant reaction when taken with alcohol. The reaction includes flushing, headache, nausea, vomiting, sweating, and increased thirst. The reaction can last from 30 minutes to several hours. What side effects may I notice from receiving this medicine? Side effects that you should report to your doctor or health care professional as soon as possible: -allergic reactions like skin rash, itching or hives, swelling of the face, lips, or tongue -low blood counts - This drug may decrease the number  of white blood cells, red blood cells and platelets. You may be at increased risk for infections and bleeding. -signs of infection - fever or chills, cough, sore throat, pain or difficulty passing urine -signs of decreased platelets or bleeding - bruising, pinpoint red spots on the skin, black, tarry stools, nosebleeds -signs of decreased red blood cells - unusually weak or tired, fainting spells, lightheadedness -breathing problems -chest  pain -high or low blood pressure -mouth sores -nausea and vomiting -pain, swelling, redness or irritation at the injection site -pain, tingling, numbness in the hands or feet -slow or irregular heartbeat -swelling of the ankle, feet, hands Side effects that usually do not require medical attention (report to your doctor or health care professional if they continue or are bothersome): -bone pain -complete hair loss including hair on your head, underarms, pubic hair, eyebrows, and eyelashes -changes in the color of fingernails -diarrhea -loosening of the fingernails -loss of appetite -muscle or joint pain -red flush to skin -sweating This list may not describe all possible side effects. Call your doctor for medical advice about side effects. You may report side effects to FDA at 1-800-FDA-1088. Where should I keep my medicine? This drug is given in a hospital or clinic and will not be stored at home. NOTE: This sheet is a summary. It may not cover all possible information. If you have questions about this medicine, talk to your doctor, pharmacist, or health care provider.    2016, [employer]. (2014-12-12 13:02:56)

## 2016-03-09 NOTE — Progress Notes (Deleted)
[place]  Telephone:([phone]) [phone] Fax:(336) [phone]  Clinic follow-up Note   Patient Care Team: No Pcp Per Patient as PCP - General (General Practice) 03/09/2016  CHIEF COMPLAINTS Follow up metastatic gastric cancer  Oncology History   Gastric adenocarcinoma: Stage IV   Staging form: Stomach, AJCC 7th Edition   - Clinical: Stage IV (TX, NX, M1) - Unsigned      Gastric adenocarcinoma: Stage IV   04/23/2014 Imaging    CT abdomen showed peritoneal carcinomatosis, ascites, thickning of gastric and GEJ wall, CT chest showed a few small b/l lung nodules.       04/24/2014 Tumor Marker    CEA  5.6, CA19.9 13      04/25/2014 Initial Diagnosis    Gastric adenocarcinoma: Stage IV      04/25/2014 Pathology Results    Stomach mass biopsy showed - ADENOCARCINOMA WITH SIGNET RING FEATURES. ascites cytology (+)      04/25/2014 Procedure    EGD: There was a large, nearly circumferential, ulcerated, clearly malignant mass that stradles the GE junction. The vast bulk of the mass lays in the stomach.       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 I saw him first when he was recently admitted to [facility]. He is here for the first office visit after hospital discharge.  He was  admitted on 12/15 with progressive, 4 month history of dysphagia, epigastric abdominal discomfort and bloating, early satiation accompanied by nausea, and postprandial  vomiting and diarrhea. He has experienced a 30 lbs weight loss in the process. He has noted increased abdominal girth. Patient denies any shortness of breath, chest pain, GI bleed, hematuria or lower extremity swelling. He denies heavy alcohol intake. Denies tobacco abuse. Denies risk factors for HIV or hepatitis. He denies any family history of GI malignancies.  CT of the abdomen and pelvis with contrast on 12/15 revealed several pulmonary nodules at the bases bilaterally, profound thickening of the distal esophagus extending beyond the gastroesophageal junction into the proximal stomach,cardia and fundus of the stomach, most evident along the lesser curvature. No pathologic dilatation of small bowel or colon. Extensive upper abdominal and retroperitoneal lymphadenopathy was visualized. Moderate volume of ascites, presumably malignant, was noted. Extensive soft tissue nodularity throughout the omentum, compatible with omental caking and widespread peritoneal metastasis. Additionally, there are other areas of enhancing nodularity along the peritoneal surface. Tiny ventral and umbilical hernias containing predominantly omental fat, including some omental implants. There are no aggressive appearing lytic or blastic lesions noted in the visualized portions of the skeleton.  He underwent an EGD by Dr. Ardis Hughs on 04/25/2014, which showed a large fungating mass that straddles the GE junction and occupying the upper 1/3 part of the stomach. Multiple biopsy was taken which showed adenocarcinoma with signet ring features. He also underwent a paracentesis with 4.5 L fluid is removed on 04/26/2014. Cytology was positive for adenocarcinoma  with signet ring features, consistent with metastatic disease.  He was discharged to home on 04/27/2014. He did feel a slightly informant of his abdominal bloating after the paracentesis, but not much improvement other symptoms. He had Mediport placement and the second paracentesis  was again 4 L fluids removal on 05/02/2014. His appetite is low, he drinks ensure 3 bottles a day, some soup and a very little other foods. His energy level is also low, but able to take care of all his daily needs, and move around with about much difficulty. His abdominal discomfort/pain are relatively controlled by morphine 15 mg at night and tramadol 2-3 times during the day. He denies any fever, chills, cough, chest pain or any in the other discomfort.  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 He has required more frequent paracentesis daily, once a week on average, with 5 years ascites removal. But he otherwise feels well, denies any significant pain, nausea, or other symptoms. His appetite and energy level remains to be decent. He still waiting his insurance company to approve his participating of a clinical trial at [facility].   MEDICAL HISTORY:  Past Medical History:  Diagnosis Date  . gastric ca dx'd 04/2014  . Medical history non-contributory     SURGICAL HISTORY: Past Surgical History:  Procedure Laterality Date  . ESOPHAGOGASTRODUODENOSCOPY (EGD) WITH PROPOFOL N/A 04/25/2014   Procedure: ESOPHAGOGASTRODUODENOSCOPY (EGD) WITH PROPOFOL;  Surgeon: Milus Banister, MD;  Location: [facility];  Service: Endoscopy;  Laterality: N/A;  . SQUAMOUS CELL CARCINOMA EXCISION  approx March 2015   removed from top of head    SOCIAL HISTORY: Social History   Social History  . Marital status: Single    Spouse name: N/A  . Number of children: N/A  . Years of education: N/A   Occupational History  . Not on file.   Social History Main Topics  . Smoking status: Never Smoker  . Smokeless tobacco: Never Used  . Alcohol use No  . Drug use: No  . Sexual activity: Not on file   Other Topics Concern  . Not on file   Social History Narrative     Single, lives alone with pet dog (rescue dog)   Works as Information systems manager; drives; independent   Middle child between #2 sisters-his mother and sisters are very supportive    FAMILY HISTORY: Family History  Problem Relation Age of Onset  . Breast cancer Sister 27   . Hypertension Mother   . Hypertension Father   Maternal aunt breast cancer at age of 52 Maternal cousin ovarian cancer at age of 57 Maternal ancle prostate cancer age of 27   ALLERGIES:  has No Known Allergies.  MEDICATIONS:  Current Outpatient Prescriptions  Medication Sig Dispense Refill  . docusate sodium 100 MG CAPS Take 100 mg by mouth 2 (two) times daily. 10 capsule 0  . enoxaparin (LOVENOX) 150 MG/ML injection Inject 0.87 mLs (130 mg total) into the skin 2 (two) times daily. 60 Syringe 1  . lidocaine-prilocaine (EMLA) cream Apply 1 application topically as needed. 30 g 2  . mirtazapine (REMERON) 15 MG tablet Take 1 tablet (15 mg total) by mouth at bedtime. 30 tablet 1  . morphine (MS CONTIN) 15 MG 12 hr tablet Take 1 tablet (15 mg total) by mouth every 12 (twelve) hours. 60 tablet 0  .  ondansetron (ZOFRAN) 4 MG tablet Take 1 tablet (4 mg total) by mouth every 8 (eight) hours as needed for nausea or vomiting. 20 tablet 0  . pantoprazole (PROTONIX) 40 MG tablet Take 1 tablet (40 mg total) by mouth daily. 30 tablet 0  . prochlorperazine (COMPAZINE) 10 MG tablet Take 1 tablet (10 mg total) by mouth every 6 (six) hours as needed for nausea or vomiting. 30 tablet 3  . traMADol (ULTRAM) 50 MG tablet Take 1-2 tablets (50-100 mg total) by mouth every 6 (six) hours as needed for moderate pain. 30 tablet 0   No current facility-administered medications for this visit.    Facility-Administered Medications Ordered in Other Visits  Medication Dose Route Frequency Provider Last Rate Last Dose  . sodium chloride 0.9 % injection 10 mL  10 mL Intravenous PRN Truitt Merle, MD        REVIEW OF SYSTEMS:   Constitutional: Denies  fevers, chills or abnormal night sweats, no recent weight loss Eyes: Denies blurriness of vision, double vision or watery eyes Ears, nose, mouth, throat, and face: Denies mucositis or sore throat Respiratory: Denies cough, dyspnea or wheezes Cardiovascular: Denies palpitation, chest discomfort or lower extremity swelling Gastrointestinal:  Less nausea, abdominal bloating and discomfort Skin: Denies abnormal skin rashes Lymphatics: Denies new lymphadenopathy or easy bruising Neurological:Denies numbness, tingling or new weaknesses Behavioral/Psych: Mood is stable, no new changes  All other systems were reviewed with the patient and are negative except those mentioned in the history.  PHYSICAL EXAMINATION: ECOG PERFORMANCE STATUS: 1 There were no vitals taken for this visit.  GENERAL:alert, no distress and comfortable SKIN: skin color, texture, turgor are normal, no rashes or significant lesions EYES: normal, conjunctiva are pink and non-injected, sclera clear OROPHARYNX:no exudate, no erythema and lips, buccal mucosa, and tongue normal  NECK: supple, thyroid normal size, non-tender, without nodularity LYMPH:  no palpable lymphadenopathy in the cervical, axillary or inguinal LUNGS: clear to auscultation and percussion with normal breathing effort HEART: regular rate & rhythm and no murmurs and no lower extremity edema ABDOMEN:abdomen soft, non-tender and normal bowel sounds, (+) ascites. Musculoskeletal:no cyanosis of digits and no clubbing  PSYCH: alert & oriented x 3 with fluent speech NEURO: no focal motor/sensory deficits  LABORATORY DATA:  I have reviewed the data as listed CBC Latest Ref Rng & Units 03/02/2016 01/27/2016 01/13/2016  WBC 4.0 - 10.3 10e3/uL 10.4(H) 5.9 6.4  Hemoglobin 13.0 - 17.1 g/dL 13.4 12.4(L) 12.1(L)  Hematocrit 38.4 - 49.9 % 42.1 38.5 37.6(L)  Platelets 140 - 400 10e3/uL 360 266 242      CMP Latest Ref Rng & Units 03/02/2016 01/27/2016 01/13/2016  Glucose 70  - 140 mg/dl 116 101 95  BUN 7.0 - 26.0 mg/dL 14.5 12.6 16.2  Creatinine 0.7 - 1.3 mg/dL 0.9 0.9 0.8  Sodium 136 - 145 mEq/L 141 143 144  Potassium 3.5 - 5.1 mEq/L 4.2 3.8 3.7  Chloride 96 - 112 mEq/L - - -  CO2 22 - 29 mEq/L _0 Calcium 8.4 - 10.4 mg/dL 9.8 9.1 9.1  Total Protein 6.4 - 8.3 g/dL 7.5 6.5 6.3(L)  Total Bilirubin 0.20 - 1.20 mg/dL 0.61 0.53 0.45  Alkaline Phos 40 - 150 U/L 127 130 118  AST 5 - 34 U/L _1 ALT 0 - 55 U/L _2 CEA (0-5.0ng/ml) 04/25/2016: 5.6 08/26/2014: 1.7 10/08/2014: 1.8 02/25/2015: 3.9 05/14/2015: 2.9 08/26/2015: 7.3 11/04/2015: 45.7 12/30/2015: 73.4 03/02/2016: 153.3  Pathology report  Diagnosis  06/07/2014 PERITONEAL/ASCITIC FLUID(SPECIMEN 1 OF 1 COLLECTED 04/26/14): METASTATIC ADENOCARCINOMA WITH SIGNET RING CELL FEATURES.  Stomach, biopsy, r/o cancer 04/25/14 - ADENOCARCINOMA WITH SIGNET RING FEATURES. Microscopic Comment The results were called to Dr. Ardis Hughs on 04/26/2014. (JDP:kh 04/26/14) Claudette Laws MD Pathologist, Electronic Signature (Case signed 04/26/2014)  HER-2/NEU BY CISH - NO AMPLIFICATION OF HER-2 DETECTED. RESULT RATIO OF HER2: CEP 17 SIGNALS 1.20 AVERAGE HER2 COPY NUMBER PER CELL 2.10  ADDITIONAL INFORMATION: Mismatch Repair (MMR) Protein Immunohistochemistry (IHC) IHC Expression Result: MLH1: Preserved nuclear expression (greater 50% tumor expression) MSH2: Preserved nuclear expression (greater 50% tumor expression) MSH6: Preserved nuclear expression (greater 50% tumor expression) PMS2: Preserved nuclear expression (greater 50% tumor expression) * Internal control demonstrates intact nuclear expression Interpretation: NORMAL There is preserved expression of the major and minor MMR proteins. There is a very low probability that microsatellite instability (MSI) is present. However, certain clinically significant MMR protein mutations may result in preservation of nuclear expression. It is recommended  that the preservation of protein expression be correlated with molecular based MSI testing.  [facility] [employer] testing: (+) GM01 and NF1   RADIOGRAPHIC STUDIES: I have personally reviewed the radiological images as listed and agreed with the findings in the report  CT chest, abdomen and pelvis with contrast, [facility] 02/02/2016 1. Resolved pulmonary embolus. 2. Stable pulmonary metastases. 3. Stable calcified intra-abdominal lymphadenopathy and residual gastric tumor. 4. Stable peritoneal carcinomatosis.  5. Unchanged large ascites, extending into the umbilicus and right inguinal canal.     ASSESSMENT & PLAN:  40 year old gentleman without significant past medical history presents with abdominal bloating, nausea, vomiting, diarrhea, anemia and weight loss. EGD showed a large mass arising from the gastric fundus and extending into GE junction, biopsy showed adenocarcinoma with signet ring features, CT scan is consistent with peritoneal carcinomatosis and bilateral lung metastasis. Ascites cytology was positive for malignant cells.  1. 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 HER-2 negative, MMR-normal -I previously discussed his surgical biopsy results, cytology results, scan findings with patient and his family members, including his parents and 2 sisters. Unfortunately, this is widely metastatic, not curable, and overall prognosis is very poor.  -His tumor is negative for HER-2 overexpression, no role for trastuzumab. -he had excellent response to first-line chemotherapy FOLFOX, currently on second line FOLFIRI due to disease progression. -I reviewed his recently restaging CT from 02/02/2016 whiich was done at [facility]. Per report, he has stable disease. However, his tumor marker CEA has been slowly trending up, and he has required more paracentesis lately, he clinically has slow disease progression. -He maybe is eligible  for an immunotherapy clinical trial at [facility], however his insurance company did not. Dr. Hetty Ely has  Appealed and it's under reviewed  -Due to his worsening ascites, I recommended him to consider starting treatment as soon as possible -I spoke with Dr. Hetty Ely after his clinic visit, and she is agreeable to start pt on Taxol and Ramucirumab, will start next week, I spoke with pt, he is agreeable   2. Recurrent malignant ascites -s/p multiple paracentesis  -He has required more frequent paracentesis. I'll schedule weekly for the next few weeks  -We'll  Continue monitoring  3. Anemia, multifactorial (iron deficient anemia, cancer related and chemotherapy induced ) -His ferritin level was normal at 171, but he has low iron and saturation, likely some degree of iron deficient anemia. -He has received IV Feraheme 510 mg twice,  2 weeks apart -Blood transfusion as needed during chemotherapy if her hemoglobin below 8. -Mild anemia has been stable.  4. Morbid obesity  -He has great appetite, eats very well, he knows to watch his diet.   Plan -Paracentesis once a week by interventional radiology -He will start weekly Taxol (3 weeks on, and one week off) and ramucirumab next week   The patient knows to call the clinic with any problems, questions or concerns.  I spent 20 minutes counseling the patient face to face. The total time spent in the appointment was 30  minutes and more than 50% was on counseling    Truitt Merle, MD 03/09/2016

## 2016-03-09 NOTE — Progress Notes (Signed)
[place]  [phone]) [phone] [phone]  Clinic follow-up Note   Patient Care Team: No Pcp Per Patient as PCP - General (General Practice) 03/09/2016  CHIEF COMPLAINTS Follow up metastatic gastric cancer  Oncology History   Gastric adenocarcinoma: Stage IV   Staging form: Stomach, AJCC 7th Edition   - Clinical: Stage IV (TX, NX, M1) - Unsigned      Gastric adenocarcinoma: Stage IV   04/23/2014 Imaging    CT abdomen showed peritoneal carcinomatosis, ascites, thickning of gastric and GEJ wall, CT chest showed a few small b/l lung nodules.       04/24/2014 Tumor Marker    CEA  5.6, CA19.9 13      04/25/2014 Initial Diagnosis    Gastric adenocarcinoma: Stage IV      04/25/2014 Pathology Results    Stomach mass biopsy showed - ADENOCARCINOMA WITH SIGNET RING FEATURES. ascites cytology (+)      04/25/2014 Procedure    EGD: There was a large, nearly circumferential, ulcerated, clearly malignant mass that stradles the GE junction. The vast bulk of the mass lays in the stomach.       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 I saw him first when he was recently admitted to [facility]. He is here for the first office visit after hospital discharge.  He was  admitted on 12/15 with progressive, 4 month history of dysphagia, epigastric abdominal discomfort and bloating, early satiation accompanied by nausea, and postprandial  vomiting and diarrhea. He has experienced a 30 lbs weight loss in the process. He has noted increased abdominal girth. Patient denies any shortness of breath, chest pain, GI bleed, hematuria or lower extremity swelling. He denies heavy alcohol intake. Denies tobacco abuse. Denies risk factors for HIV or hepatitis. He denies any family history of GI malignancies.  CT of the abdomen and pelvis with contrast on 12/15 revealed several pulmonary nodules at the bases bilaterally, profound thickening of the distal esophagus extending beyond the gastroesophageal junction into the proximal stomach,cardia and fundus of the stomach, most evident along the lesser curvature. No pathologic dilatation of small bowel or colon. Extensive upper abdominal and retroperitoneal lymphadenopathy was visualized. Moderate volume of ascites, presumably malignant, was noted. Extensive soft tissue nodularity throughout the omentum, compatible with omental caking and widespread peritoneal metastasis. Additionally, there are other areas of enhancing nodularity along the peritoneal surface. Tiny ventral and umbilical hernias containing predominantly omental fat, including some omental implants. There are no aggressive appearing lytic or blastic lesions noted in the visualized portions of the skeleton.  He underwent an EGD by Dr. Ardis Durham on 04/25/2014, which showed a large fungating mass that straddles the GE junction and occupying the upper 1/3 part of the stomach. Multiple biopsy was taken which showed adenocarcinoma with signet ring features. He also underwent a paracentesis with 4.5 L fluid is removed on 04/26/2014. Cytology was positive for adenocarcinoma  with signet ring features, consistent with metastatic disease.  He was discharged to home on 04/27/2014. He did feel a slightly informant of his abdominal bloating after the paracentesis, but not much improvement other symptoms. He had Mediport placement and the second paracentesis  was again 4 L fluids removal on 05/02/2014. His appetite is low, he drinks ensure 3 bottles a day, some soup and a very little other foods. His energy level is also low, but able to take care of all his daily needs, and move around with about much difficulty. His abdominal discomfort/pain are relatively controlled by morphine 15 mg at night and tramadol 2-3 times during the day. He denies any fever, chills, cough, chest pain or any in the other discomfort.  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  Last weekend he was in extreme pain and bloating with vomiting. He went to the ED on 03/06/16 and was told they did not perform paracentesis on the weekend.   He was able to come in for paracentesis yesterday and his pain improved.    He has not been able to keep food down for several days. He has been able to take in fluids. He has been drinking Ensure. He tried to eat some apple sauce this morning and some came back up.   States previously prescribed tramadol did not seem to help. He is agreeable to trying Vicodin.   MEDICAL HISTORY:  Past Medical History:  Diagnosis Date  . gastric ca dx'd 04/2014  . Medical history non-contributory     SURGICAL HISTORY: Past Surgical History:  Procedure Laterality Date  . ESOPHAGOGASTRODUODENOSCOPY (EGD) WITH PROPOFOL N/A 04/25/2014   Procedure: ESOPHAGOGASTRODUODENOSCOPY (EGD) WITH PROPOFOL;  Surgeon: Milus Banister, MD;  Location: [facility];  Service: Endoscopy;  Laterality: N/A;  . SQUAMOUS CELL CARCINOMA EXCISION  approx March 2015   removed from top of head    SOCIAL HISTORY: Social History   Social History  . Marital status: Single    Spouse name: N/A  . Number of children: N/A  . Years of education: N/A     Occupational History  . Not on file.   Social History Main Topics  . Smoking status: Never Smoker  . Smokeless tobacco: Never Used  . Alcohol use No  . Drug use: No  . Sexual activity: Not on file   Other Topics Concern  . Not on file   Social History Narrative   Single, lives alone with pet dog (rescue dog)   Works as Information systems manager; drives; independent   Middle child between #2 sisters-his mother and sisters are very supportive    FAMILY HISTORY: Family History  Problem Relation Age of Onset  . Breast cancer Sister 43   . Hypertension Mother   . Hypertension Father   Maternal aunt breast cancer at age of 72 Maternal cousin ovarian cancer at age of 22 Maternal ancle prostate cancer age of 57   ALLERGIES:  has No Known Allergies.  MEDICATIONS:  Current Outpatient Prescriptions  Medication Sig Dispense Refill  . docusate sodium 100 MG CAPS Take 100 mg by mouth 2 (two) times daily. 10 capsule 0  . enoxaparin (LOVENOX) 150 MG/ML injection Inject 0.87 mLs (130 mg total) into the skin 2 (two) times daily. [place]  Syringe 1  . HYDROcodone-acetaminophen (NORCO/VICODIN) 5-325 MG tablet Take 1 tablet by mouth every 6 (six) hours as needed for moderate pain. 30 tablet 0  . lidocaine-prilocaine (EMLA) cream Apply 1 application topically as needed. 30 g 2  . mirtazapine (REMERON) 15 MG tablet Take 1 tablet (15 mg total) by mouth at bedtime. 30 tablet 1  . morphine (MS CONTIN) 15 MG 12 hr tablet Take 1 tablet (15 mg total) by mouth every 12 (twelve) hours. 60 tablet 0  . ondansetron (ZOFRAN) 4 MG tablet Take 1 tablet (4 mg total) by mouth every 8 (eight) hours as needed for nausea or vomiting. 20 tablet 0  . pantoprazole (PROTONIX) 40 MG tablet Take 1 tablet (40 mg total) by mouth daily. 30 tablet 0  . prochlorperazine (COMPAZINE) 10 MG tablet Take 1 tablet (10 mg total) by mouth every 6 (six) hours as needed for nausea or vomiting. 30 tablet 3  . traMADol (ULTRAM) 50 MG tablet  Take 1-2 tablets (50-100 mg total) by mouth every 6 (six) hours as needed for moderate pain. 30 tablet 0   No current facility-administered medications for this visit.    Facility-Administered Medications Ordered in Other Visits  Medication Dose Route Frequency Provider Last Rate Last Dose  . sodium chloride 0.9 % injection 10 mL  10 mL Intravenous PRN Malachy Mood, MD        REVIEW OF SYSTEMS:   Constitutional: Denies fevers, chills or abnormal night sweats, no recent weight loss Eyes: Denies blurriness of vision, double vision or watery eyes Ears, nose, mouth, throat, and face: Denies mucositis or sore throat Respiratory: Denies cough, dyspnea or wheezes Cardiovascular: Denies palpitation, chest discomfort or lower extremity swelling Gastrointestinal:  (+) abdominal bloating, tenderness and discomfort, vomiting Skin: Denies abnormal skin rashes Lymphatics: Denies new lymphadenopathy or easy bruising Neurological:Denies numbness, tingling or new weaknesses Behavioral/Psych: Mood is stable, no new changes  All other systems were reviewed with the patient and are negative except those mentioned in the history.  PHYSICAL EXAMINATION: ECOG PERFORMANCE STATUS: 1 BP (!) 131/92 (BP Location: Left Arm, Patient Position: Sitting)   Pulse (!) 117   Temp 98.5 F (36.9 C) (Oral)   Resp 18   Ht 5\' 9"  (1.753 m)   Wt 258 lb 3.2 oz (117.1 kg)   SpO2 97%   BMI 38.13 kg/m   GENERAL:alert, no distress and comfortable SKIN: skin color, texture, turgor are normal, no rashes or significant lesions EYES: normal, conjunctiva are pink and non-injected, sclera clear OROPHARYNX:no exudate, no erythema and lips, buccal mucosa, and tongue normal  NECK: supple, thyroid normal size, non-tender, without nodularity LYMPH:  no palpable lymphadenopathy in the cervical, axillary or inguinal LUNGS: clear to auscultation and percussion with normal breathing effort HEART: regular rate & rhythm and no murmurs and no  lower extremity edema ABDOMEN:abdomen soft and normal bowel sounds, (+) ascites, and mild tenderness, no rebound tenderness  Musculoskeletal:no cyanosis of digits and no clubbing  PSYCH: alert & oriented x 3 with fluent speech NEURO: no focal motor/sensory deficits   LABORATORY DATA:  I have reviewed the data as listed CBC Latest Ref Rng & Units 03/09/2016 03/02/2016 01/27/2016  WBC 4.0 - 10.3 10e3/uL 13.2(H) 10.4(H) 5.9  Hemoglobin 13.0 - 17.1 g/dL 01/29/2016 32.8 12.4(L)  Hematocrit 38.4 - 49.9 % 44.4 42.1 38.5  Platelets 140 - 400 10e3/uL 410(H) 360 266      CMP Latest Ref Rng & Units 03/09/2016 03/02/2016 01/27/2016  Glucose 70 - 140  mg/dl 134 116 101  BUN 7.0 - 26.0 mg/dL 22.6 14.5 12.6  Creatinine 0.7 - 1.3 mg/dL 1.0 0.9 0.9  Sodium 136 - 145 mEq/L 138 141 143  Potassium 3.5 - 5.1 mEq/L 4.2 4.2 3.8  Chloride 96 - 112 mEq/L - - -  CO2 22 - 29 mEq/L '23 26 25  '$ Calcium 8.4 - 10.4 mg/dL 9.4 9.8 9.1  Total Protein 6.4 - 8.3 g/dL 7.1 7.5 6.5  Total Bilirubin 0.20 - 1.20 mg/dL 0.67 0.61 0.53  Alkaline Phos 40 - 150 U/L 270(H) 127 130  AST 5 - 34 U/L 33 19 20  ALT 0 - 55 U/L 49 14 19   CEA (0-5.0ng/ml) 04/25/2016: 5.6 08/26/2014: 1.7 10/08/2014: 1.8 02/25/2015: 3.9 05/14/2015: 2.9 08/26/2015: 7.3 11/04/2015: 45.7 12/30/2015: 73.4 03/02/2016: 153.3    Pathology report  Diagnosis  06/07/2014 PERITONEAL/ASCITIC FLUID(SPECIMEN 1 OF 1 COLLECTED 04/26/14): METASTATIC ADENOCARCINOMA WITH SIGNET RING CELL FEATURES.  Stomach, biopsy, r/o cancer 04/25/14 - ADENOCARCINOMA WITH SIGNET RING FEATURES. Microscopic Comment The results were called to Dr. Ardis Durham on 04/26/2014. (JDP:kh 04/26/14) Claudette Laws MD Pathologist, Electronic Signature (Case signed 04/26/2014)  HER-2/NEU BY CISH - NO AMPLIFICATION OF HER-2 DETECTED. RESULT RATIO OF HER2: CEP 17 SIGNALS 1.20 AVERAGE HER2 COPY NUMBER PER CELL 2.10  ADDITIONAL INFORMATION: Mismatch Repair (MMR) Protein Immunohistochemistry (IHC) IHC  Expression Result: MLH1: Preserved nuclear expression (greater 50% tumor expression) MSH2: Preserved nuclear expression (greater 50% tumor expression) MSH6: Preserved nuclear expression (greater 50% tumor expression) PMS2: Preserved nuclear expression (greater 50% tumor expression) * Internal control demonstrates intact nuclear expression Interpretation: NORMAL There is preserved expression of the major and minor MMR proteins. There is a very low probability that microsatellite instability (MSI) is present. However, certain clinically significant MMR protein mutations may result in preservation of nuclear expression. It is recommended that the preservation of protein expression be correlated with molecular based MSI testing.  [facility] [employer] testing: (+) VV61 and NF1   RADIOGRAPHIC STUDIES: I have personally reviewed the radiological images as listed and agreed with the findings in the report  CT chest, abdomen and pelvis with contrast, [facility] 02/02/2016 1. Resolved pulmonary embolus. 2. Stable pulmonary metastases. 3. Stable calcified intra-abdominal lymphadenopathy and residual gastric tumor. 4. Stable peritoneal carcinomatosis.  5. Unchanged large ascites, extending into the umbilicus and right inguinal canal.     ASSESSMENT & PLAN:  40 year old gentleman without significant past medical history presents with abdominal bloating, nausea, vomiting, diarrhea, anemia and weight loss. EGD showed a large mass arising from the gastric fundus and extending into GE junction, biopsy showed adenocarcinoma with signet ring features, CT scan is consistent with peritoneal carcinomatosis and bilateral lung metastasis. Ascites cytology was positive for malignant cells.  1. 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 HER-2 negative, MMR-normal -I previously discussed his surgical biopsy results, cytology results, scan findings with  patient and his family members, including his parents and 2 sisters. Unfortunately, this is widely metastatic, not curable, and overall prognosis is very poor.  -His tumor is negative for HER-2 overexpression, no role for trastuzumab. -he had excellent response to first-line chemotherapy FOLFOX, currently on second line FOLFIRI due to disease progression. -I reviewed his recently restaging CT from 02/02/2016 whiich was done at [facility]. Per report, he has stable disease. However, his tumor marker CEA has been slowly trending up, and he has required more paracentesis lately, he clinically has slow disease progression. -He maybe is eligible for an  immunotherapy clinical trial at [facility], however his insurance company did not. Dr. Hetty Ely has  Appealed and it's under reviewed  -Due to his worsening ascites, I recommended him to consider starting treatment as soon as possible -I spoke with Dr. Hetty Ely and she is agreeable to start pt on Taxol and Ramucirumab, --Chemotherapy consent: Side effects including but does not not limited to, fatigue, nausea, vomiting, diarrhea, hair loss, neuropathy, fluid retention, renal and kidney dysfunction, neutropenic fever, needed for blood transfusion, bleeding, hypertension, risk of thrombosis, were discussed with patient in great detail. He agrees to proceed. Will start today -I called in Zofran and Compazine to his pharmacy today  2. Recurrent malignant ascites -s/p multiple paracentesis, much more frequent lately -We discussed Pleurx placement, he is agreeable -Continued abdominal tenderness and vomiting after paracentesis. I will order testing of paracentesis fluid to check for infection -We'll continue monitoring  3. Anemia, multifactorial (iron deficient anemia, cancer related and chemotherapy induced ) -His ferritin level today pending, but he has low iron and saturation, likely some degree of iron deficient anemia. -He has received IV Feraheme 510 mg twice,  2 weeks apart -Blood transfusion as needed during chemotherapy if his hemoglobin below 8. HGB 14.2 today.  4. Morbid obesity  -Knows to watch his diet.   5. Abdominal pain -Likely secondary to his peritoneal carcinomatosis, I have little suspicion for infection -I give him a prescription of Vicodin  Plan -Paracentesis this Friday with Pleurx placement by interventional radiology -Starting ramucirumab this week with Taxol, and continue week for 2 more weeks  -Compazine, Vicodin, and Zofran prescription written -Follow up next week   The patient knows to call the clinic with any problems, questions or concerns.  I spent 25inutes counseling the patient face to face. The total time spent in the appointment was 30  minutes and more than 50% was on counseling   This document serves as a record of services personally performed by Truitt Merle, MD. It was created on her behalf by Arlyce Harman, a trained medical scribe. The creation of this record is based on the scribe's personal observations and the provider's statements to them. This document has been checked and approved by the attending provider.   Truitt Merle, MD 03/09/2016

## 2016-03-09 NOTE — Progress Notes (Signed)
Port accessed and no blood return noted. TPA given 1233. Patient transported back to lab for blood drawn. RN at [facility] notified.

## 2016-03-11 ENCOUNTER — Other Ambulatory Visit: Payer: Self-pay | Admitting: *Deleted

## 2016-03-11 DIAGNOSIS — C169 Malignant neoplasm of stomach, unspecified: Secondary | ICD-10-CM

## 2016-03-12 ENCOUNTER — Other Ambulatory Visit: Payer: Self-pay | Admitting: *Deleted

## 2016-03-12 ENCOUNTER — Ambulatory Visit (HOSPITAL_COMMUNITY)
Admission: RE | Admit: 2016-03-12 | Discharge: 2016-03-12 | Disposition: A | Discharge status: Home / Self Care | Payer: BLUE CROSS/BLUE SHIELD | Source: Ambulatory Visit | Attending: Hematology | Admitting: Hematology

## 2016-03-12 ENCOUNTER — Ambulatory Visit (HOSPITAL_COMMUNITY): Admission: RE | Admit: 2016-03-12 | Payer: BLUE CROSS/BLUE SHIELD | Source: Ambulatory Visit

## 2016-03-12 DIAGNOSIS — C169 Malignant neoplasm of stomach, unspecified: Secondary | ICD-10-CM

## 2016-03-12 NOTE — Procedures (Signed)
Successful US guided paracentesis from right lateral abdomen.  Yielded 1.4 liters of clear yellow fluid.  No immediate complications.  Pt tolerated well.   Christyan Reger S Abeni Finchum PA-C 03/12/2016 2:40 PM

## 2016-03-15 ENCOUNTER — Telehealth: Payer: Self-pay | Admitting: Medical Oncology

## 2016-03-15 NOTE — Telephone Encounter (Signed)
Chemo f/u call -chemo 10/31. "It has been a rough week after chemo. Very fatigued , not eating , drinking Gatorade and my sister brought me some  pedialyte" . He states he has diarrhea 6 x day since wed.He states he is taking 2 imodium after every diarrheal stool. I encouraged small sips of fluid every 10-15 minutes. He says he has appt with [facility] tomorrow .Note to [facility]

## 2016-03-16 ENCOUNTER — Ambulatory Visit (HOSPITAL_BASED_OUTPATIENT_CLINIC_OR_DEPARTMENT_OTHER): Payer: BLUE CROSS/BLUE SHIELD

## 2016-03-16 ENCOUNTER — Ambulatory Visit: Payer: BLUE CROSS/BLUE SHIELD

## 2016-03-16 ENCOUNTER — Other Ambulatory Visit (HOSPITAL_BASED_OUTPATIENT_CLINIC_OR_DEPARTMENT_OTHER): Payer: BLUE CROSS/BLUE SHIELD

## 2016-03-16 ENCOUNTER — Other Ambulatory Visit: Payer: Self-pay | Admitting: *Deleted

## 2016-03-16 ENCOUNTER — Encounter: Payer: Self-pay | Admitting: Hematology

## 2016-03-16 ENCOUNTER — Ambulatory Visit (HOSPITAL_BASED_OUTPATIENT_CLINIC_OR_DEPARTMENT_OTHER): Payer: BLUE CROSS/BLUE SHIELD | Admitting: Hematology

## 2016-03-16 VITALS — BP 118/94 | HR 124 | Temp 98.0°F | Resp 18 | Ht 69.0 in | Wt 248.0 lb

## 2016-03-16 DIAGNOSIS — R180 Malignant ascites: Secondary | ICD-10-CM

## 2016-03-16 DIAGNOSIS — C7800 Secondary malignant neoplasm of unspecified lung: Secondary | ICD-10-CM | POA: Diagnosis not present

## 2016-03-16 DIAGNOSIS — C169 Malignant neoplasm of stomach, unspecified: Secondary | ICD-10-CM

## 2016-03-16 DIAGNOSIS — Z452 Encounter for adjustment and management of vascular access device: Secondary | ICD-10-CM | POA: Diagnosis not present

## 2016-03-16 DIAGNOSIS — D6481 Anemia due to antineoplastic chemotherapy: Secondary | ICD-10-CM

## 2016-03-16 DIAGNOSIS — R188 Other ascites: Secondary | ICD-10-CM

## 2016-03-16 DIAGNOSIS — C786 Secondary malignant neoplasm of retroperitoneum and peritoneum: Secondary | ICD-10-CM

## 2016-03-16 DIAGNOSIS — D509 Iron deficiency anemia, unspecified: Secondary | ICD-10-CM

## 2016-03-16 DIAGNOSIS — R197 Diarrhea, unspecified: Secondary | ICD-10-CM

## 2016-03-16 DIAGNOSIS — Z5111 Encounter for antineoplastic chemotherapy: Secondary | ICD-10-CM

## 2016-03-16 DIAGNOSIS — Z95828 Presence of other vascular implants and grafts: Secondary | ICD-10-CM

## 2016-03-16 DIAGNOSIS — D508 Other iron deficiency anemias: Secondary | ICD-10-CM

## 2016-03-16 DIAGNOSIS — R630 Anorexia: Secondary | ICD-10-CM

## 2016-03-16 DIAGNOSIS — R109 Unspecified abdominal pain: Secondary | ICD-10-CM

## 2016-03-16 DIAGNOSIS — E860 Dehydration: Secondary | ICD-10-CM

## 2016-03-16 DIAGNOSIS — D630 Anemia in neoplastic disease: Secondary | ICD-10-CM | POA: Diagnosis not present

## 2016-03-16 DIAGNOSIS — R5383 Other fatigue: Secondary | ICD-10-CM

## 2016-03-16 LAB — CBC & DIFF AND RETIC
BASO%: 0.2 % (ref 0.0–2.0)
Basophils Absolute: 0 10*3/uL (ref 0.0–0.1)
EOS ABS: 0.1 10*3/uL (ref 0.0–0.5)
EOS%: 0.6 % (ref 0.0–7.0)
HCT: 36.9 % — ABNORMAL LOW (ref 38.4–49.9)
HEMOGLOBIN: 11.8 g/dL — AB (ref 13.0–17.1)
Immature Retic Fract: 25.2 % — ABNORMAL HIGH (ref 3.00–10.60)
LYMPH%: 13.6 % — AB (ref 14.0–49.0)
MCH: 25.5 pg — AB (ref 27.2–33.4)
MCHC: 32 g/dL (ref 32.0–36.0)
MCV: 79.9 fL (ref 79.3–98.0)
MONO#: 0.6 10*3/uL (ref 0.1–0.9)
MONO%: 5.8 % (ref 0.0–14.0)
NEUT#: 8.6 10*3/uL — ABNORMAL HIGH (ref 1.5–6.5)
NEUT%: 79.8 % — ABNORMAL HIGH (ref 39.0–75.0)
Platelets: 439 10*3/uL — ABNORMAL HIGH (ref 140–400)
RBC: 4.62 10*6/uL (ref 4.20–5.82)
RDW: 16.2 % — ABNORMAL HIGH (ref 11.0–14.6)
Retic %: 0.77 % — ABNORMAL LOW (ref 0.80–1.80)
Retic Ct Abs: 35.57 10*3/uL (ref 34.80–93.90)
WBC: 10.7 10*3/uL — AB (ref 4.0–10.3)
lymph#: 1.5 10*3/uL (ref 0.9–3.3)

## 2016-03-16 LAB — COMPREHENSIVE METABOLIC PANEL
ALBUMIN: 2.4 g/dL — AB (ref 3.5–5.0)
ALK PHOS: 184 U/L — AB (ref 40–150)
ALT: 35 U/L (ref 0–55)
AST: 27 U/L (ref 5–34)
Anion Gap: 12 mEq/L — ABNORMAL HIGH (ref 3–11)
BUN: 24.3 mg/dL (ref 7.0–26.0)
CALCIUM: 8.7 mg/dL (ref 8.4–10.4)
CO2: 23 mEq/L (ref 22–29)
Chloride: 101 mEq/L (ref 98–109)
Creatinine: 1.1 mg/dL (ref 0.7–1.3)
EGFR: 86 mL/min/{1.73_m2} — AB (ref >90)
Glucose: 142 mg/dl — ABNORMAL HIGH (ref 70–140)
POTASSIUM: 3.5 meq/L (ref 3.5–5.1)
SODIUM: 136 meq/L (ref 136–145)
Total Bilirubin: 0.46 mg/dL (ref 0.20–1.20)
Total Protein: 6.5 g/dL (ref 6.4–8.3)

## 2016-03-16 LAB — TECHNOLOGIST REVIEW

## 2016-03-16 LAB — CEA (IN HOUSE-CHCC): CEA (CHCC-IN HOUSE): 159.65 ng/mL — AB (ref 0.00–5.00)

## 2016-03-16 MED ORDER — SODIUM CHLORIDE 0.9 % IV SOLN: Freq: Once | INTRAVENOUS | Status: DC

## 2016-03-16 MED ORDER — DEXAMETHASONE SODIUM PHOSPHATE 10 MG/ML IJ SOLN
INTRAMUSCULAR | Status: AC
  Filled 2016-03-16: qty 1

## 2016-03-16 MED ORDER — SODIUM CHLORIDE 0.9 % IV SOLN
Freq: Once | INTRAVENOUS | Status: AC
  Administered 2016-03-16: 13:00:00 via INTRAVENOUS

## 2016-03-16 MED ORDER — DIPHENHYDRAMINE HCL 50 MG/ML IJ SOLN
50.0000 mg | Freq: Once | INTRAMUSCULAR | Status: AC
  Administered 2016-03-16: 50 mg via INTRAVENOUS

## 2016-03-16 MED ORDER — DEXAMETHASONE SODIUM PHOSPHATE 10 MG/ML IJ SOLN
10.0000 mg | Freq: Once | INTRAMUSCULAR | Status: AC
  Administered 2016-03-16: 10 mg via INTRAVENOUS

## 2016-03-16 MED ORDER — DIPHENOXYLATE-ATROPINE 2.5-0.025 MG PO TABS: 1.0000 | ORAL_TABLET | Freq: Four times a day (QID) | ORAL | 1 refills | Status: AC | PRN

## 2016-03-16 MED ORDER — HEPARIN SOD (PORK) LOCK FLUSH 100 UNIT/ML IV SOLN
500.0000 [IU] | Freq: Once | INTRAVENOUS | Status: AC | PRN
  Administered 2016-03-16: 500 [IU]
  Filled 2016-03-16: qty 5

## 2016-03-16 MED ORDER — ALTEPLASE 2 MG IJ SOLR
2.0000 mg | Freq: Once | INTRAMUSCULAR | Status: AC | PRN
  Administered 2016-03-16: 2 mg
  Filled 2016-03-16: qty 2

## 2016-03-16 MED ORDER — DIPHENHYDRAMINE HCL 50 MG/ML IJ SOLN
INTRAMUSCULAR | Status: AC
  Filled 2016-03-16: qty 1

## 2016-03-16 MED ORDER — FAMOTIDINE IN NACL 20-0.9 MG/50ML-% IV SOLN
20.0000 mg | Freq: Once | INTRAVENOUS | Status: AC
  Administered 2016-03-16: 20 mg via INTRAVENOUS

## 2016-03-16 MED ORDER — SODIUM CHLORIDE 0.9 % IJ SOLN
10.0000 mL | INTRAMUSCULAR | Status: DC | PRN
  Administered 2016-03-16: 10 mL via INTRAVENOUS
  Filled 2016-03-16: qty 10

## 2016-03-16 MED ORDER — SODIUM CHLORIDE 0.9% FLUSH
10.0000 mL | INTRAVENOUS | Status: DC | PRN
  Administered 2016-03-16: 10 mL
  Filled 2016-03-16: qty 10

## 2016-03-16 MED ORDER — HEPARIN SOD (PORK) LOCK FLUSH 100 UNIT/ML IV SOLN
500.0000 [IU] | Freq: Once | INTRAVENOUS | Status: DC | PRN
  Filled 2016-03-16: qty 5

## 2016-03-16 MED ORDER — FAMOTIDINE IN NACL 20-0.9 MG/50ML-% IV SOLN
INTRAVENOUS | Status: AC
  Filled 2016-03-16: qty 50

## 2016-03-16 MED ORDER — PACLITAXEL CHEMO INJECTION 300 MG/50ML
80.0000 mg/m2 | Freq: Once | INTRAVENOUS | Status: AC
  Administered 2016-03-16: 198 mg via INTRAVENOUS
  Filled 2016-03-16: qty 33

## 2016-03-16 NOTE — Patient Instructions (Signed)
[facility] Discharge Instructions for Patients Receiving Chemotherapy  Today you received the following chemotherapy agents Taxol   To help prevent nausea and vomiting after your treatment, we encourage you to take your nausea medication as directed.   If you develop nausea and vomiting that is not controlled by your nausea medication, call the clinic.   BELOW ARE SYMPTOMS THAT SHOULD BE REPORTED IMMEDIATELY:  *FEVER GREATER THAN 100.5 F  *CHILLS WITH OR WITHOUT FEVER  NAUSEA AND VOMITING THAT IS NOT CONTROLLED WITH YOUR NAUSEA MEDICATION  *UNUSUAL SHORTNESS OF BREATH  *UNUSUAL BRUISING OR BLEEDING  TENDERNESS IN MOUTH AND THROAT WITH OR WITHOUT PRESENCE OF ULCERS  *URINARY PROBLEMS  *BOWEL PROBLEMS  UNUSUAL RASH Items with * indicate a potential emergency and should be followed up as soon as possible.  Feel free to call the clinic you have any questions or concerns. The clinic phone number is [phone].  Please show the CHEMO ALERT CARD at check-in to the Emergency Department and triage nurse.   

## 2016-03-16 NOTE — Progress Notes (Signed)
[facility]  [phone] [phone]  Clinic follow-up Note   Patient Care Team: No Pcp Per Patient as PCP - General (General Practice) 03/16/2016  CHIEF COMPLAINTS Follow up metastatic gastric cancer  Oncology History   Gastric adenocarcinoma: Stage IV   Staging form: Stomach, AJCC 7th Edition   - Clinical: Stage IV (TX, NX, M1) - Unsigned      Gastric adenocarcinoma: Stage IV   04/23/2014 Imaging    CT abdomen showed peritoneal carcinomatosis, ascites, thickning of gastric and GEJ wall, CT chest showed a few small b/l lung nodules.       04/24/2014 Tumor Marker    CEA  5.6, CA19.9 13      04/25/2014 Initial Diagnosis    Gastric adenocarcinoma: Stage IV      04/25/2014 Pathology Results    Stomach mass biopsy showed - ADENOCARCINOMA WITH SIGNET RING FEATURES. ascites cytology (+)      04/25/2014 Procedure    EGD: There was a large, nearly circumferential, ulcerated, clearly malignant mass that stradles the GE junction. The vast bulk of the mass lays in the stomach.       05/07/2014 - 03/11/2015 Chemotherapy    mFOLFOX, with neulasta as needed, bolus 5FU omitted from cycle 6 due to cytopenia, stopped due to disease progression       06/03/2014 Miscellaneous    [facility] genetic test (+) for:  NF1 splice site [id number] del 47 TP53 R213*       03/25/2015 - 01/27/2016 Chemotherapy    FOLFIRI, every 2 weeks, tolerated well, stopped due to slow disease progression        HISTORY OF PRESENTING ILLNESS (05/06/2014):  Samuel Durham 40 y.o. 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 I saw him first when he was recently admitted to [facility]. He is here for the first office visit after hospital discharge.  He was  admitted on 12/15 with progressive, 4 month history of dysphagia, epigastric abdominal discomfort and bloating, early satiation accompanied by nausea, and postprandial  vomiting and diarrhea. He has experienced a 30 lbs weight loss in the process. He has noted increased abdominal girth. Patient denies any shortness of breath, chest pain, GI bleed, hematuria or lower extremity swelling. He denies heavy alcohol intake. Denies tobacco abuse. Denies risk factors for HIV or hepatitis. He denies any family history of GI malignancies.  CT of the abdomen and pelvis with contrast on 12/15 revealed several pulmonary nodules at the bases bilaterally, profound thickening of the distal esophagus extending beyond the gastroesophageal junction into the proximal stomach,cardia and fundus of the stomach, most evident along the lesser curvature. No pathologic dilatation of small bowel or colon. Extensive upper abdominal and retroperitoneal lymphadenopathy was visualized. Moderate volume of ascites, presumably malignant, was noted. Extensive soft tissue nodularity throughout the omentum, compatible with omental caking and widespread peritoneal metastasis. Additionally, there are other areas of enhancing nodularity along the peritoneal surface. Tiny ventral and umbilical hernias containing predominantly omental fat, including some omental implants. There are no aggressive appearing lytic or blastic lesions noted in the visualized portions of the skeleton.  He underwent an EGD by Dr. Ardis Hughs on 04/25/2014, which showed a large fungating mass that straddles the GE junction and occupying the upper 1/3 part of the stomach. Multiple biopsy was taken which showed adenocarcinoma with signet ring features. He also underwent a paracentesis with 4.5 L fluid is removed on 04/26/2014. Cytology was positive for adenocarcinoma  with signet ring features, consistent with metastatic disease.  He was discharged to home on 04/27/2014. He did feel a slightly informant of his abdominal bloating after the paracentesis, but not much improvement other symptoms. He had Mediport placement and the second paracentesis  was again 4 L fluids removal on 05/02/2014. His appetite is low, he drinks ensure 3 bottles a day, some soup and a very little other foods. His energy level is also low, but able to take care of all his daily needs, and move around with about much difficulty. His abdominal discomfort/pain are relatively controlled by morphine 15 mg at night and tramadol 2-3 times during the day. He denies any fever, chills, cough, chest pain or any in the other discomfort.  [facility] genetic test (07/21/9700): NF1 splice site [id number] del 47 TP53 R213*   CURRENT THERAPY: Taxol 97m/m2 on day 1,8,15, Ramucirumab 815mkg on day 1 and 14, every 28 days, started on 03/09/2016  INTERIM HISTORY: Samuel Durham for follow up and ongoing treatment. He only had a 1.4 L of ascites removed from paracentesis last Friday, abdominal bloating seems to be less Had a rough week after chemotherapy last week, very fatigued, very low appetite, not being able to eat much, able to drink adequately, moderate diarrhea, mild nausea, no vomiting No fever or chills, mild to moderate intermittent abdominal pain Lost about 10 lbs last week    MEDICAL HISTORY:  Past Medical History:  Diagnosis Date  . gastric ca dx'd 04/2014  . Medical history non-contributory     SURGICAL HISTORY: Past Surgical History:  Procedure Laterality Date  . ESOPHAGOGASTRODUODENOSCOPY (EGD) WITH PROPOFOL N/A 04/25/2014   Procedure: ESOPHAGOGASTRODUODENOSCOPY (EGD) WITH PROPOFOL;  Surgeon: DaMilus BanisterMD;  Location: [facility];  Service: Endoscopy;  Laterality: N/A;  . SQUAMOUS CELL CARCINOMA EXCISION  approx March 2015   removed from top of head    SOCIAL HISTORY: Social History   Social History  . Marital status: Single    Spouse name: N/A  . Number of children: N/A  . Years of education: N/A   Occupational History  . Not on file.   Social History Main Topics  . Smoking status: Never Smoker  . Smokeless tobacco: Never Used  .  Alcohol use No  . Drug use: No  . Sexual activity: Not on file   Other Topics Concern  . Not on file   Social History Narrative   Single, lives alone with pet dog (rescue dog)   Works as laInformation systems managerdrives; independent   Middle child between #2 sisters-his mother and sisters are very supportive    FAMILY HISTORY: Family History  Problem Relation Age of Onset  . Breast cancer Sister [id number] . Hypertension Mother   . Hypertension Father   Maternal aunt breast cancer at age of 5025aternal cousin ovarian cancer at age of 4292aternal ancle prostate cancer age of 42 ALLERGIES:  has No Known Allergies.  MEDICATIONS:  Current Outpatient Prescriptions  Medication Sig Dispense Refill  . HYDROcodone-acetaminophen (NORCO/VICODIN) 5-325 MG tablet Take 1 tablet by mouth every 6 (six) hours as needed for moderate pain. 30 tablet 0  . lidocaine-prilocaine (EMLA) cream Apply 1 application topically as needed. 30 g 2  . prochlorperazine (COMPAZINE) 10 MG tablet Take 1 tablet (10 mg total) by mouth every 6 (six) hours as needed for nausea or vomiting. 30 tablet 3  . docusate sodium 100 MG CAPS Take 100 mg by mouth 2 (  two) times daily. (Patient not taking: Reported on 03/16/2016) 10 capsule 0  . enoxaparin (LOVENOX) 150 MG/ML injection Inject 0.87 mLs (130 mg total) into the skin 2 (two) times daily. (Patient not taking: Reported on 03/16/2016) 60 Syringe 1  . mirtazapine (REMERON) 15 MG tablet Take 1 tablet (15 mg total) by mouth at bedtime. (Patient not taking: Reported on 03/16/2016) 30 tablet 1  . morphine (MS CONTIN) 15 MG 12 hr tablet Take 1 tablet (15 mg total) by mouth every 12 (twelve) hours. (Patient not taking: Reported on 03/16/2016) 60 tablet 0  . ondansetron (ZOFRAN) 4 MG tablet Take 1 tablet (4 mg total) by mouth every 8 (eight) hours as needed for nausea or vomiting. (Patient not taking: Reported on 03/16/2016) 20 tablet 0  . pantoprazole (PROTONIX) 40 MG tablet Take 1 tablet (40  mg total) by mouth daily. (Patient not taking: Reported on 03/16/2016) 30 tablet 0  . traMADol (ULTRAM) 50 MG tablet Take 1-2 tablets (50-100 mg total) by mouth every 6 (six) hours as needed for moderate pain. (Patient not taking: Reported on 03/16/2016) 30 tablet 0   No current facility-administered medications for this visit.    Facility-Administered Medications Ordered in Other Visits  Medication Dose Route Frequency Provider Last Rate Last Dose  . heparin lock flush 100 unit/mL  500 Units Intravenous Once PRN Truitt Merle, MD      . sodium chloride 0.9 % injection 10 mL  10 mL Intravenous PRN Truitt Merle, MD        REVIEW OF SYSTEMS:   Constitutional: Denies fevers, chills or abnormal night sweats, no recent weight loss Eyes: Denies blurriness of vision, double vision or watery eyes Ears, nose, mouth, throat, and face: Denies mucositis or sore throat Respiratory: Denies cough, dyspnea or wheezes Cardiovascular: Denies palpitation, chest discomfort or lower extremity swelling Gastrointestinal:  (+) abdominal bloating, tenderness and discomfort, vomiting Skin: Denies abnormal skin rashes Lymphatics: Denies new lymphadenopathy or easy bruising Neurological:Denies numbness, tingling or new weaknesses Behavioral/Psych: Mood is stable, no new changes  All other systems were reviewed with the patient and are negative except those mentioned in the history.  PHYSICAL EXAMINATION: ECOG PERFORMANCE STATUS: 2-3 BP (!) 118/94 (BP Location: Left Arm, Patient Position: Sitting) Comment: made nurse aware  Pulse (!) 124 Comment: Made nurse aware   Temp 98 F (36.7 C) (Oral)   Resp 18   Ht 5' 9"  (1.753 m)   Wt 248 lb (112.5 kg)   SpO2 99%   BMI 36.62 kg/m   GENERAL:alert, no distress and comfortable SKIN: skin color, texture, turgor are normal, no rashes or significant lesions EYES: normal, conjunctiva are pink and non-injected, sclera clear OROPHARYNX:no exudate, no erythema and lips, buccal mucosa,  and tongue normal  NECK: supple, thyroid normal size, non-tender, without nodularity LYMPH:  no palpable lymphadenopathy in the cervical, axillary or inguinal LUNGS: clear to auscultation and percussion with normal breathing effort HEART: regular rate & rhythm and no murmurs and no lower extremity edema ABDOMEN:abdomen soft and normal bowel sounds, (+) mild tenderness, no rebound tenderness  Musculoskeletal:no cyanosis of digits and no clubbing  PSYCH: alert & oriented x 3 with fluent speech NEURO: no focal motor/sensory deficits   LABORATORY DATA:  I have reviewed the data as listed CBC Latest Ref Rng & Units 03/16/2016 03/09/2016 03/02/2016  WBC 4.0 - 10.3 10e3/uL 10.7(H) 13.2(H) 10.4(H)  Hemoglobin 13.0 - 17.1 g/dL 11.8(L) 14.2 13.4  Hematocrit 38.4 - 49.9 % 36.9(L) 44.4 42.1  Platelets  140 - 400 10e3/uL 439(H) 410(H) 360      CMP Latest Ref Rng & Units 03/16/2016 03/09/2016 03/02/2016  Glucose 70 - 140 mg/dl 142(H) 134 116  BUN 7.0 - 26.0 mg/dL 24.3 22.6 14.5  Creatinine 0.7 - 1.3 mg/dL 1.1 1.0 0.9  Sodium 136 - 145 mEq/L 136 138 141  Potassium 3.5 - 5.1 mEq/L 3.5 4.2 4.2  Chloride 96 - 112 mEq/L - - -  CO2 22 - 29 mEq/L 23 23 26   Calcium 8.4 - 10.4 mg/dL 8.7 9.4 9.8  Total Protein 6.4 - 8.3 g/dL 6.5 7.1 7.5  Total Bilirubin 0.20 - 1.20 mg/dL 0.46 0.67 0.61  Alkaline Phos 40 - 150 U/L 184(H) 270(H) 127  AST 5 - 34 U/L 27 33 19  ALT 0 - 55 U/L 35 49 14   CEA (0-5.0ng/ml) 04/25/2016: 5.6 08/26/2014: 1.7 10/08/2014: 1.8 02/25/2015: 3.9 05/14/2015: 2.9 08/26/2015: 7.3 11/04/2015: 45.7 12/30/2015: 73.4 03/02/2016: 153.3    Pathology report  Diagnosis  06/07/2014 PERITONEAL/ASCITIC FLUID(SPECIMEN 1 OF 1 COLLECTED 04/26/14): METASTATIC ADENOCARCINOMA WITH SIGNET RING CELL FEATURES.  Stomach, biopsy, r/o cancer 04/25/14 - ADENOCARCINOMA WITH SIGNET RING FEATURES. Microscopic Comment The results were called to Dr. Ardis Hughs on 04/26/2014. (JDP:kh 04/26/14) Claudette Laws  MD Pathologist, Electronic Signature (Case signed 04/26/2014)  HER-2/NEU BY CISH - NO AMPLIFICATION OF HER-2 DETECTED. RESULT RATIO OF HER2: CEP 17 SIGNALS 1.20 AVERAGE HER2 COPY NUMBER PER CELL 2.10  ADDITIONAL INFORMATION: Mismatch Repair (MMR) Protein Immunohistochemistry (IHC) IHC Expression Result: MLH1: Preserved nuclear expression (greater 50% tumor expression) MSH2: Preserved nuclear expression (greater 50% tumor expression) MSH6: Preserved nuclear expression (greater 50% tumor expression) PMS2: Preserved nuclear expression (greater 50% tumor expression) * Internal control demonstrates intact nuclear expression Interpretation: NORMAL There is preserved expression of the major and minor MMR proteins. There is a very low probability that microsatellite instability (MSI) is present. However, certain clinically significant MMR protein mutations may result in preservation of nuclear expression. It is recommended that the preservation of protein expression be correlated with molecular based MSI testing.  [facility] [employer] testing: (+) IO27 and NF1   RADIOGRAPHIC STUDIES: I have personally reviewed the radiological images as listed and agreed with the findings in the report  CT chest, abdomen and pelvis with contrast, [facility] 02/02/2016 1. Resolved pulmonary embolus. 2. Stable pulmonary metastases. 3. Stable calcified intra-abdominal lymphadenopathy and residual gastric tumor. 4. Stable peritoneal carcinomatosis.  5. Unchanged large ascites, extending into the umbilicus and right inguinal canal.     ASSESSMENT & PLAN:  40 year old gentleman without significant past medical history presents with abdominal bloating, nausea, vomiting, diarrhea, anemia and weight loss. EGD showed a large mass arising from the gastric fundus and extending into GE junction, biopsy showed adenocarcinoma with signet ring features, CT scan is consistent with peritoneal carcinomatosis and  bilateral lung metastasis. Ascites cytology was positive for malignant cells.  1. 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 HER-2 negative, MMR-normal -I previously discussed his surgical biopsy results, cytology results, scan findings with patient and his family members, including his parents and 2 sisters. Unfortunately, this is widely metastatic, not curable, and overall prognosis is very poor.  -His tumor is negative for HER-2 overexpression, no role for trastuzumab. -he had excellent response to first-line chemotherapy FOLFOX, currently on second line FOLFIRI due to disease progression. -I reviewed his recently restaging CT from 02/02/2016 whiich was done at [facility]. Per report, he has stable disease. However, his tumor  marker CEA has been slowly trending up, and he has required more paracentesis lately, he clinically has slow disease progression. -He maybe is eligible for an immunotherapy clinical trial at [facility], however his insurance company did not. Dr. Hetty Ely has  Appealed and it's under reviewed  -Due to his worsening ascites, I recommended him to consider starting treatment as soon as possible -He started Taxol and Cyramza last week, did not tolerate well, although I think most of his symptoms are rated to his cancer progression -Lab reviewed, adequate for treatment, we'll continue chemotherapy, Taxol alone today  2. Recurrent malignant ascites -s/p multiple paracentesis, much more frequent lately -His ascites has became less since he started chemotherapy last week, we'll continue monitoring, he is scheduled for a repeated paracentesis later this week  3. Anemia, multifactorial (iron deficient anemia, cancer related and chemotherapy induced ) -His ferritin level today pending, but he has low iron and saturation, likely some degree of iron deficient anemia. -He has received IV Feraheme 510 mg twice, 2 weeks apart -Blood  transfusion as needed during chemotherapy if his hemoglobin below 8. HGB 14.2 today.  4. Morbid obesity  -Knows to watch his diet.   5. Abdominal pain -Likely secondary to his peritoneal carcinomatosis, I have little suspicion for infection -continue Vicodin as needed   6. Anorexia, diarrhea, fatigue  -Some are related to his underlying cancer progression, some are related to chemotherapy last week -I give him a prescription of Lomotil today for diarrhea, he will also use Imodium as needed -I strongly encouraged him to take nutritional supplement -IV fluids today and Friday  Plan -week 2 chemo with Taxol -NS 759m today and this Friday  -I give him a prescription of Lomotil today -I'll see him back next week before chemo  -He is scheduled for paracentesis later this week  The patient knows to call the clinic with any problems, questions or concerns.  I spent 25inutes counseling the patient face to face. The total time spent in the appointment was 30  minutes and more than 50% was on counseling     FTruitt Merle MD 03/16/2016

## 2016-03-16 NOTE — Progress Notes (Signed)
Cath flo instilled into Port-A-Cath per polic at [id number]

## 2016-03-17 ENCOUNTER — Ambulatory Visit (HOSPITAL_COMMUNITY)
Admission: RE | Admit: 2016-03-17 | Discharge: 2016-03-17 | Disposition: A | Discharge status: Home / Self Care | Payer: BLUE CROSS/BLUE SHIELD | Source: Ambulatory Visit | Attending: Hematology | Admitting: Hematology

## 2016-03-17 ENCOUNTER — Other Ambulatory Visit: Payer: Self-pay | Admitting: Hematology

## 2016-03-17 ENCOUNTER — Encounter: Payer: Self-pay | Admitting: Hematology

## 2016-03-17 DIAGNOSIS — R188 Other ascites: Secondary | ICD-10-CM | POA: Diagnosis not present

## 2016-03-17 DIAGNOSIS — C169 Malignant neoplasm of stomach, unspecified: Secondary | ICD-10-CM | POA: Diagnosis present

## 2016-03-17 NOTE — Progress Notes (Signed)
Patient ID: Samuel Durham, male   DOB: [birth year], 40 y.o.   MRN: [id number] Patient presented to ultrasound department today for therapeutic paracentesis. On limited ultrasound of abdomen in all 4 quadrants there is only a small amount of ascites present primarily in the right upper quadrant. At this time patient wishes to wait to perform paracentesis until next week. Procedure canceled for today.

## 2016-03-19 ENCOUNTER — Ambulatory Visit (HOSPITAL_COMMUNITY): Admission: RE | Admit: 2016-03-19 | Payer: BLUE CROSS/BLUE SHIELD | Source: Ambulatory Visit

## 2016-03-22 NOTE — Progress Notes (Signed)
[place]  [phone] [phone]  Clinic follow-up Note   Patient Care Team: No Pcp Per Patient as PCP - General (General Practice) 03/23/2016  CHIEF COMPLAINTS Follow up metastatic gastric cancer  Oncology History   Gastric adenocarcinoma: Stage IV   Staging form: Stomach, AJCC 7th Edition   - Clinical: Stage IV (TX, NX, M1) - Unsigned      Gastric adenocarcinoma: Stage IV   04/23/2014 Imaging    CT abdomen showed peritoneal carcinomatosis, ascites, thickning of gastric and GEJ wall, CT chest showed a few small b/l lung nodules.       04/24/2014 Tumor Marker    CEA  5.6, CA19.9 13      04/25/2014 Initial Diagnosis    Gastric adenocarcinoma: Stage IV      04/25/2014 Pathology Results    Stomach mass biopsy showed - ADENOCARCINOMA WITH SIGNET RING FEATURES. ascites cytology (+)      04/25/2014 Procedure    EGD: There was a large, nearly circumferential, ulcerated, clearly malignant mass that stradles the GE junction. The vast bulk of the mass lays in the stomach.       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 [facility] office visit after hospital discharge.  He was  admitted on 12/15 with progressive, 4 month history of  dysphagia, epigastric abdominal discomfort and bloating, early satiation accompanied by nausea, and postprandial vomiting and diarrhea. He has experienced a 30 lbs weight loss in the process. He has noted increased abdominal girth. Patient denies any shortness of breath, chest pain, GI bleed, hematuria or lower extremity swelling. He denies heavy alcohol intake. Denies tobacco abuse. Denies risk factors for HIV or hepatitis. He denies any family history of GI malignancies.  CT of the abdomen and pelvis with contrast on 12/15 revealed several pulmonary nodules at the bases bilaterally, profound thickening of the distal esophagus extending beyond the gastroesophageal junction into the proximal stomach,cardia and fundus of the stomach, most evident along the lesser curvature. No pathologic dilatation of small bowel or colon. Extensive upper abdominal and retroperitoneal lymphadenopathy was visualized. Moderate volume of ascites, presumably malignant, was noted. Extensive soft tissue nodularity throughout the omentum, compatible with omental caking and widespread peritoneal metastasis. Additionally, there are other areas of enhancing nodularity along the peritoneal surface. Tiny ventral and umbilical hernias containing predominantly omental fat, including some omental implants. There are no aggressive appearing lytic or blastic lesions noted in the visualized portions of the skeleton.  He underwent an EGD by Dr. JaArdis Hughsn 04/25/2014, which showed a large fungating mass that straddles the GE junction and occupying the upper 1/3 part of the stomach. Multiple biopsy  was taken which showed adenocarcinoma with signet ring features. He also underwent a paracentesis with 4.5 L fluid is removed on 04/26/2014. Cytology was positive for adenocarcinoma with signet ring features, consistent with metastatic disease.  He was discharged to home on 04/27/2014. He did feel a slightly informant of his abdominal bloating after  the paracentesis, but not much improvement other symptoms. He had Mediport placement and the second paracentesis was again 4 L fluids removal on 05/02/2014. His appetite is low, he drinks ensure 3 bottles a day, some soup and a very little other foods. His energy level is also low, but able to take care of all his daily needs, and move around with about much difficulty. His abdominal discomfort/pain are relatively controlled by morphine 15 mg at night and tramadol 2-3 times during the day. He denies any fever, chills, cough, chest pain or any in the other discomfort.  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 He tolerated chemotherapy little better last week, still had moderate nausea, and diarrhea for 2-3 days, for which he took Lomotil which helped. Hepatitis still low, drinks ensure once a day and small meals, gained 5 pounds back last week Ultrasound of abdomen showed minimal ascites last Wednesday Still feel fatigued, able to take care of himself at home   MEDICAL HISTORY:  Past Medical History:  Diagnosis Date  . gastric ca dx'd 04/2014  . Medical history non-contributory     SURGICAL HISTORY: Past Surgical History:  Procedure Laterality Date  . ESOPHAGOGASTRODUODENOSCOPY (EGD) WITH PROPOFOL N/A 04/25/2014   Procedure: ESOPHAGOGASTRODUODENOSCOPY (EGD) WITH PROPOFOL;  Surgeon: DaMilus BanisterMD;  Location: [facility];  Service: Endoscopy;  Laterality: N/A;  . SQUAMOUS CELL CARCINOMA EXCISION  approx March 2015   removed from top of head    SOCIAL HISTORY: Social History   Social History  . Marital status: Single    Spouse name: N/A  . Number of children: N/A  . Years of education: N/A   Occupational History  . Not on file.   Social  History Main Topics  . Smoking status: Never Smoker  . Smokeless tobacco: Never Used  . Alcohol use No  . Drug use: No  . Sexual activity: Not on file   Other Topics Concern  . Not on file   Social History Narrative   Single, lives alone with pet dog (rescue dog)   Works as laInformation systems managerdrives; independent   Middle child between #2 sisters-his mother and sisters are very supportive    FAMILY HISTORY: Family History  Problem Relation Age of Onset  . Breast cancer Sister 3661 . Hypertension Mother   . Hypertension Father   Maternal aunt breast cancer at age of 5042aternal cousin ovarian cancer at age of 4250aternal ancle prostate cancer age of 552 ALLERGIES:  has No Known Allergies.  MEDICATIONS:  Current Outpatient Prescriptions  Medication Sig Dispense Refill  . diphenoxylate-atropine (LOMOTIL) 2.5-0.025 MG tablet Take 1-2 tablets by mouth 4 (four) times daily as needed for diarrhea or loose stools. 60 tablet 1  . docusate sodium 100 MG CAPS Take 100 mg by mouth 2 (two) times daily. (Patient not taking: Reported on 03/16/2016) 10 capsule 0  . enoxaparin (LOVENOX) 150 MG/ML injection  Inject 0.87 mLs (130 mg total) into the skin 2 (two) times daily. (Patient not taking: Reported on 03/16/2016) 60 Syringe 1  . HYDROcodone-acetaminophen (NORCO/VICODIN) 5-325 MG tablet Take 1 tablet by mouth every 6 (six) hours as needed for moderate pain. 30 tablet 0  . lidocaine-prilocaine (EMLA) cream Apply 1 application topically as needed. 30 g 2  . mirtazapine (REMERON) 15 MG tablet Take 1 tablet (15 mg total) by mouth at bedtime. (Patient not taking: Reported on 03/16/2016) 30 tablet 1  . morphine (MS CONTIN) 15 MG 12 hr tablet Take 1 tablet (15 mg total) by mouth every 12 (twelve) hours. (Patient not taking: Reported on 03/16/2016) 60 tablet 0  . ondansetron (ZOFRAN) 4 MG tablet Take 1 tablet (4 mg total) by mouth every 8 (eight) hours as needed for nausea or vomiting. (Patient not  taking: Reported on 03/16/2016) 20 tablet 0  . pantoprazole (PROTONIX) 40 MG tablet Take 1 tablet (40 mg total) by mouth daily. (Patient not taking: Reported on 03/16/2016) 30 tablet 0  . prochlorperazine (COMPAZINE) 10 MG tablet Take 1 tablet (10 mg total) by mouth every 6 (six) hours as needed for nausea or vomiting. 30 tablet 3  . traMADol (ULTRAM) 50 MG tablet Take 1-2 tablets (50-100 mg total) by mouth every 6 (six) hours as needed for moderate pain. (Patient not taking: Reported on 03/16/2016) 30 tablet 0   Current Facility-Administered Medications  Medication Dose Route Frequency Provider Last Rate Last Dose  . 0.9 %  sodium chloride infusion   Intravenous Continuous Truitt Merle, MD       Facility-Administered Medications Ordered in Other Visits  Medication Dose Route Frequency Provider Last Rate Last Dose  . heparin lock flush 100 unit/mL  500 Units Intravenous Once PRN Truitt Merle, MD      . sodium chloride 0.9 % injection 10 mL  10 mL Intravenous PRN Truitt Merle, MD        REVIEW OF SYSTEMS:   Constitutional: Denies fevers, chills or abnormal night sweats, no recent weight loss Eyes: Denies blurriness of vision, double vision or watery eyes Ears, nose, mouth, throat, and face: Denies mucositis or sore throat Respiratory: Denies cough, dyspnea or wheezes Cardiovascular: Denies palpitation, chest discomfort or lower extremity swelling Gastrointestinal:  (+) abdominal bloating, tenderness and discomfort, vomiting Skin: Denies abnormal skin rashes Lymphatics: Denies new lymphadenopathy or easy bruising Neurological:Denies numbness, tingling or new weaknesses Behavioral/Psych: Mood is stable, no new changes  All other systems were reviewed with the patient and are negative except those mentioned in the history.  PHYSICAL EXAMINATION: ECOG PERFORMANCE STATUS: 2 BP 110/72 (BP Location: Left Arm, Patient Position: Sitting)   Pulse (!) 106 Comment: Made Nurse Myrtle aware of high Pulse   Temp 98  F (36.7 C) (Oral)   Resp 18   Ht _0  (1.753 m)   Wt 253 lb 11.2 oz (115.1 kg)   SpO2 98%   BMI 37.46 kg/m   GENERAL:alert, no distress and comfortable SKIN: skin color, texture, turgor are normal, no rashes or significant lesions EYES: normal, conjunctiva are pink and non-injected, sclera clear OROPHARYNX:no exudate, no erythema and lips, buccal mucosa, and tongue normal  NECK: supple, thyroid normal size, non-tender, without nodularity LYMPH:  no palpable lymphadenopathy in the cervical, axillary or inguinal LUNGS: clear to auscultation and percussion with normal breathing effort HEART: regular rate & rhythm and no murmurs and no lower extremity edema ABDOMEN:abdomen soft and normal bowel sounds, (+) mild tenderness, no rebound tenderness  Musculoskeletal:no cyanosis of digits and no clubbing  PSYCH: alert & oriented x 3 with fluent speech NEURO: no focal motor/sensory deficits   LABORATORY DATA:  I have reviewed the data as listed CBC Latest Ref Rng & Units 03/23/2016 03/16/2016 03/09/2016  WBC 4.0 - 10.3 10e3/uL 3.2(L) 10.7(H) 13.2(H)  Hemoglobin 13.0 - 17.1 g/dL 10.8(L) 11.8(L) 14.2  Hematocrit 38.4 - 49.9 % 34.0(L) 36.9(L) 44.4  Platelets 140 - 400 10e3/uL 320 439(H) 410(H)      CMP Latest Ref Rng & Units 03/23/2016 03/16/2016 03/09/2016  Glucose 70 - 140 mg/dl 120 142(H) 134  BUN 7.0 - 26.0 mg/dL 13.0 24.3 22.6  Creatinine 0.7 - 1.3 mg/dL 0.8 1.1 1.0  Sodium 136 - 145 mEq/L 138 136 138  Potassium 3.5 - 5.1 mEq/L 3.4(L) 3.5 4.2  Chloride 96 - 112 mEq/L - - -  CO2 22 - 29 mEq/L _0 Calcium 8.4 - 10.4 mg/dL 8.9 8.7 9.4  Total Protein 6.4 - 8.3 g/dL 6.2(L) 6.5 7.1  Total Bilirubin 0.20 - 1.20 mg/dL 0.57 0.46 0.67  Alkaline Phos 40 - 150 U/L 182(H) 184(H) 270(H)  AST 5 - 34 U/L 30 27 33  ALT 0 - 55 U/L 38 35 49   CEA (0-5.0ng/ml) 04/25/2016: 5.6 08/26/2014: 1.7 10/08/2014: 1.8 02/25/2015: 3.9 05/14/2015: 2.9 08/26/2015: 7.3 11/04/2015: 45.7 12/30/2015:  73.4 03/02/2016: 153.3 03/16/2016: 159.65   Pathology report  Diagnosis  06/07/2014 PERITONEAL/ASCITIC FLUID(SPECIMEN 1 OF 1 COLLECTED 04/26/14): METASTATIC ADENOCARCINOMA WITH SIGNET RING CELL FEATURES.  Stomach, biopsy, r/o cancer 04/25/14 - ADENOCARCINOMA WITH SIGNET RING FEATURES. Microscopic Comment The results were called to Dr. Ardis Hughs on 04/26/2014. (JDP:kh 04/26/14) Claudette Laws MD Pathologist, Electronic Signature (Case signed 04/26/2014)  HER-2/NEU BY CISH - NO AMPLIFICATION OF HER-2 DETECTED. RESULT RATIO OF HER2: CEP 17 SIGNALS 1.20 AVERAGE HER2 COPY NUMBER PER CELL 2.10  ADDITIONAL INFORMATION: Mismatch Repair (MMR) Protein Immunohistochemistry (IHC) IHC Expression Result: MLH1: Preserved nuclear expression (greater 50% tumor expression) MSH2: Preserved nuclear expression (greater 50% tumor expression) MSH6: Preserved nuclear expression (greater 50% tumor expression) PMS2: Preserved nuclear expression (greater 50% tumor expression) * Internal control demonstrates intact nuclear expression Interpretation: NORMAL There is preserved expression of the major and minor MMR proteins. There is a very low probability that microsatellite instability (MSI) is present. However, certain clinically significant MMR protein mutations may result in preservation of nuclear expression. It is recommended that the preservation of protein expression be correlated with molecular based MSI testing.  [facility] [employer] testing: (+) VF64 and NF1   RADIOGRAPHIC STUDIES: I have personally reviewed the radiological images as listed and agreed with the findings in the report  CT chest, abdomen and pelvis with contrast, [facility] 02/02/2016 1. Resolved pulmonary embolus. 2. Stable pulmonary metastases. 3. Stable calcified intra-abdominal lymphadenopathy and residual gastric tumor. 4. Stable peritoneal carcinomatosis.  5. Unchanged large ascites, extending into the umbilicus and  right inguinal canal.     ASSESSMENT & PLAN:  40 year old gentleman without significant past medical history presents with abdominal bloating, nausea, vomiting, diarrhea, anemia and weight loss. EGD showed a large mass arising from the gastric fundus and extending into GE junction, biopsy showed adenocarcinoma with signet ring features, CT scan is consistent with peritoneal carcinomatosis and bilateral lung metastasis. Ascites cytology was positive for malignant cells.  1. 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 HER-2 negative, MMR-normal -I previously discussed his surgical biopsy results, cytology results, scan findings with patient and his  family members, including his parents and 2 sisters. Unfortunately, this is widely metastatic, not curable, and overall prognosis is very poor.  -His tumor is negative for HER-2 overexpression, no role for trastuzumab. -he had excellent response to first-line chemotherapy FOLFOX, currently on second line FOLFIRI due to disease progression. -I reviewed his recently restaging CT from 02/02/2016 whiich was done at [facility]. Per report, he has stable disease. However, his tumor marker CEA has been slowly trending up, and he has required more paracentesis lately, he clinically has slow disease progression. -He maybe is eligible for an immunotherapy clinical trial at [facility], however his insurance company did not. Dr. Hetty Ely has  Appealed and it's under reviewed  -He started Taxol and Cyramza, tolerated moderately well, with fatigue, poor appetite, and diarrhea -Lab reviewed, adequate for treatment, we'll continue chemotherapy today, he is off chemo next week   2. Recurrent malignant ascites -s/p multiple paracentesis, much more frequent lately -His ascites has became less since he started chemotherapy last week, we'll continue monitoring, he is scheduled for a repeated paracentesis later this week,  patient does not think he needs it, we'll reschedule to next week  3. Anemia, multifactorial (iron deficient anemia, cancer related and chemotherapy induced ) -His ferritin level today pending, but he has low iron and saturation, likely some degree of iron deficient anemia. -He has received IV Feraheme 510 mg twice, 2 weeks apart -He became anemic again since he started chemotherapy -Blood transfusion as needed during chemotherapy if his hemoglobin below 8. HGB 10.8 today.  4. Morbid obesity  -Knows to watch his diet.   5. Abdominal pain -Likely secondary to his peritoneal carcinomatosis, I have little suspicion for infection -continue Vicodin as needed   6. Anorexia, diarrhea, fatigue  -Some are related to his underlying cancer progression, some are related to chemotherapy -Continue Lomotil as needed for diarrhea, he will also use Imodium as needed -I strongly encouraged him to take nutritional supplement, he drinks ensure 1 bottle today -IV fluids today  Plan -week 3 chemo with Taxol and cyramza today, off chemo next week -NS 1057m today  -We'll schedule IV fluids 2 hours for next Wednesday, and reschedule his paracentesis from this Friday to next Wednesday -He will return in 2 weeks for follow-up and cycle 2 chemotherapy  The patient knows to call the clinic with any problems, questions or concerns.  I spent 25inutes counseling the patient face to face. The total time spent in the appointment was 30  minutes and more than 50% was on counseling     FTruitt Merle MD 03/23/2016

## 2016-03-23 ENCOUNTER — Ambulatory Visit: Payer: BLUE CROSS/BLUE SHIELD

## 2016-03-23 ENCOUNTER — Ambulatory Visit (HOSPITAL_BASED_OUTPATIENT_CLINIC_OR_DEPARTMENT_OTHER): Payer: BLUE CROSS/BLUE SHIELD | Admitting: Hematology

## 2016-03-23 ENCOUNTER — Ambulatory Visit (HOSPITAL_BASED_OUTPATIENT_CLINIC_OR_DEPARTMENT_OTHER): Payer: BLUE CROSS/BLUE SHIELD

## 2016-03-23 ENCOUNTER — Other Ambulatory Visit (HOSPITAL_BASED_OUTPATIENT_CLINIC_OR_DEPARTMENT_OTHER): Payer: BLUE CROSS/BLUE SHIELD

## 2016-03-23 ENCOUNTER — Encounter: Payer: Self-pay | Admitting: Hematology

## 2016-03-23 VITALS — BP 110/72 | HR 106 | Temp 98.0°F | Resp 18 | Ht 69.0 in | Wt 253.7 lb

## 2016-03-23 DIAGNOSIS — C786 Secondary malignant neoplasm of retroperitoneum and peritoneum: Secondary | ICD-10-CM

## 2016-03-23 DIAGNOSIS — Z5112 Encounter for antineoplastic immunotherapy: Secondary | ICD-10-CM

## 2016-03-23 DIAGNOSIS — C7800 Secondary malignant neoplasm of unspecified lung: Secondary | ICD-10-CM

## 2016-03-23 DIAGNOSIS — D508 Other iron deficiency anemias: Secondary | ICD-10-CM

## 2016-03-23 DIAGNOSIS — R630 Anorexia: Secondary | ICD-10-CM

## 2016-03-23 DIAGNOSIS — C169 Malignant neoplasm of stomach, unspecified: Secondary | ICD-10-CM | POA: Diagnosis not present

## 2016-03-23 DIAGNOSIS — D6481 Anemia due to antineoplastic chemotherapy: Secondary | ICD-10-CM

## 2016-03-23 DIAGNOSIS — R109 Unspecified abdominal pain: Secondary | ICD-10-CM

## 2016-03-23 DIAGNOSIS — Z5111 Encounter for antineoplastic chemotherapy: Secondary | ICD-10-CM

## 2016-03-23 DIAGNOSIS — R197 Diarrhea, unspecified: Secondary | ICD-10-CM

## 2016-03-23 DIAGNOSIS — R188 Other ascites: Secondary | ICD-10-CM

## 2016-03-23 DIAGNOSIS — D509 Iron deficiency anemia, unspecified: Secondary | ICD-10-CM

## 2016-03-23 DIAGNOSIS — D630 Anemia in neoplastic disease: Secondary | ICD-10-CM

## 2016-03-23 DIAGNOSIS — R180 Malignant ascites: Secondary | ICD-10-CM

## 2016-03-23 DIAGNOSIS — R5383 Other fatigue: Secondary | ICD-10-CM

## 2016-03-23 DIAGNOSIS — Z95828 Presence of other vascular implants and grafts: Secondary | ICD-10-CM

## 2016-03-23 LAB — COMPREHENSIVE METABOLIC PANEL
ALBUMIN: 2.2 g/dL — AB (ref 3.5–5.0)
ALK PHOS: 182 U/L — AB (ref 40–150)
ALT: 38 U/L (ref 0–55)
AST: 30 U/L (ref 5–34)
Anion Gap: 13 mEq/L — ABNORMAL HIGH (ref 3–11)
BUN: 13 mg/dL (ref 7.0–26.0)
CO2: 22 meq/L (ref 22–29)
Calcium: 8.9 mg/dL (ref 8.4–10.4)
Chloride: 103 mEq/L (ref 98–109)
Creatinine: 0.8 mg/dL (ref 0.7–1.3)
EGFR: >90 mL/min/{1.73_m2} (ref >90)
GLUCOSE: 120 mg/dL (ref 70–140)
POTASSIUM: 3.4 meq/L — AB (ref 3.5–5.1)
SODIUM: 138 meq/L (ref 136–145)
TOTAL PROTEIN: 6.2 g/dL — AB (ref 6.4–8.3)
Total Bilirubin: 0.57 mg/dL (ref 0.20–1.20)

## 2016-03-23 LAB — CBC & DIFF AND RETIC
BASO%: 0.3 % (ref 0.0–2.0)
Basophils Absolute: 0 10*3/uL (ref 0.0–0.1)
EOS ABS: 0 10*3/uL (ref 0.0–0.5)
EOS%: 0.3 % (ref 0.0–7.0)
HCT: 34 % — ABNORMAL LOW (ref 38.4–49.9)
HEMOGLOBIN: 10.8 g/dL — AB (ref 13.0–17.1)
IMMATURE RETIC FRACT: 18.2 % — AB (ref 3.00–10.60)
LYMPH%: 31.1 % (ref 14.0–49.0)
MCH: 25.8 pg — AB (ref 27.2–33.4)
MCHC: 31.8 g/dL — ABNORMAL LOW (ref 32.0–36.0)
MCV: 81.3 fL (ref 79.3–98.0)
MONO#: 0.4 10*3/uL (ref 0.1–0.9)
MONO%: 13.8 % (ref 0.0–14.0)
NEUT%: 54.5 % (ref 39.0–75.0)
NEUTROS ABS: 1.7 10*3/uL (ref 1.5–6.5)
Platelets: 320 10*3/uL (ref 140–400)
RBC: 4.18 10*6/uL — ABNORMAL LOW (ref 4.20–5.82)
RDW: 17.7 % — AB (ref 11.0–14.6)
RETIC %: 1.16 % (ref 0.80–1.80)
Retic Ct Abs: 48.49 10*3/uL (ref 34.80–93.90)
WBC: 3.2 10*3/uL — AB (ref 4.0–10.3)
lymph#: 1 10*3/uL (ref 0.9–3.3)

## 2016-03-23 LAB — TECHNOLOGIST REVIEW

## 2016-03-23 MED ORDER — FAMOTIDINE IN NACL 20-0.9 MG/50ML-% IV SOLN
20.0000 mg | Freq: Once | INTRAVENOUS | Status: AC
  Administered 2016-03-23: 20 mg via INTRAVENOUS

## 2016-03-23 MED ORDER — DIPHENHYDRAMINE HCL 50 MG/ML IJ SOLN
INTRAMUSCULAR | Status: AC
  Filled 2016-03-23: qty 1

## 2016-03-23 MED ORDER — ALTEPLASE 2 MG IJ SOLR
2.0000 mg | Freq: Once | INTRAMUSCULAR | Status: AC | PRN
  Administered 2016-03-23: 2 mg
  Filled 2016-03-23: qty 2

## 2016-03-23 MED ORDER — ACETAMINOPHEN 325 MG PO TABS
650.0000 mg | ORAL_TABLET | Freq: Once | ORAL | Status: AC
  Administered 2016-03-23: 650 mg via ORAL

## 2016-03-23 MED ORDER — SODIUM CHLORIDE 0.9 % IV SOLN
Freq: Once | INTRAVENOUS | Status: AC
  Administered 2016-03-23: 12:00:00 via INTRAVENOUS

## 2016-03-23 MED ORDER — DIPHENHYDRAMINE HCL 50 MG/ML IJ SOLN
50.0000 mg | Freq: Once | INTRAMUSCULAR | Status: AC
  Administered 2016-03-23: 50 mg via INTRAVENOUS

## 2016-03-23 MED ORDER — FAMOTIDINE IN NACL 20-0.9 MG/50ML-% IV SOLN
INTRAVENOUS | Status: AC
  Filled 2016-03-23: qty 50

## 2016-03-23 MED ORDER — HEPARIN SOD (PORK) LOCK FLUSH 100 UNIT/ML IV SOLN
500.0000 [IU] | Freq: Once | INTRAVENOUS | Status: AC | PRN
  Administered 2016-03-23: 500 [IU]
  Filled 2016-03-23: qty 5

## 2016-03-23 MED ORDER — SODIUM CHLORIDE 0.9 % IJ SOLN
10.0000 mL | INTRAMUSCULAR | Status: DC | PRN
  Administered 2016-03-23: 10 mL via INTRAVENOUS
  Filled 2016-03-23: qty 10

## 2016-03-23 MED ORDER — PACLITAXEL CHEMO INJECTION 300 MG/50ML
80.0000 mg/m2 | Freq: Once | INTRAVENOUS | Status: AC
  Administered 2016-03-23: 198 mg via INTRAVENOUS
  Filled 2016-03-23: qty 33

## 2016-03-23 MED ORDER — DEXAMETHASONE SODIUM PHOSPHATE 10 MG/ML IJ SOLN
10.0000 mg | Freq: Once | INTRAMUSCULAR | Status: AC
  Administered 2016-03-23: 10 mg via INTRAVENOUS

## 2016-03-23 MED ORDER — ACETAMINOPHEN 325 MG PO TABS
ORAL_TABLET | ORAL | Status: AC
  Filled 2016-03-23: qty 2

## 2016-03-23 MED ORDER — SODIUM CHLORIDE 0.9% FLUSH
10.0000 mL | INTRAVENOUS | Status: DC | PRN
  Administered 2016-03-23: 10 mL
  Filled 2016-03-23: qty 10

## 2016-03-23 MED ORDER — DEXAMETHASONE SODIUM PHOSPHATE 10 MG/ML IJ SOLN
INTRAMUSCULAR | Status: AC
  Filled 2016-03-23: qty 1

## 2016-03-23 MED ORDER — SODIUM CHLORIDE 0.9 % IV SOLN
8.0000 mg/kg | Freq: Once | INTRAVENOUS | Status: AC
  Administered 2016-03-23: 1000 mg via INTRAVENOUS
  Filled 2016-03-23: qty 100

## 2016-03-23 NOTE — Progress Notes (Signed)
Pt's  Port-A-Cath accessed, site flushes well, no tenderness or swelling noted,  Site flushes well, no blood return noted. Cath-Flo 2 mg instilled per policy. Tube marked with date and time.

## 2016-03-23 NOTE — Patient Instructions (Signed)
[facility] Discharge Instructions for Patients Receiving Chemotherapy  Today you received the following chemotherapy agents Cyramza and Taxol.   To help prevent nausea and vomiting after your treatment, we encourage you to take your nausea medication as directed.   If you develop nausea and vomiting that is not controlled by your nausea medication, call the clinic.   BELOW ARE SYMPTOMS THAT SHOULD BE REPORTED IMMEDIATELY:  *FEVER GREATER THAN 100.5 F  *CHILLS WITH OR WITHOUT FEVER  NAUSEA AND VOMITING THAT IS NOT CONTROLLED WITH YOUR NAUSEA MEDICATION  *UNUSUAL SHORTNESS OF BREATH  *UNUSUAL BRUISING OR BLEEDING  TENDERNESS IN MOUTH AND THROAT WITH OR WITHOUT PRESENCE OF ULCERS  *URINARY PROBLEMS  *BOWEL PROBLEMS  UNUSUAL RASH Items with * indicate a potential emergency and should be followed up as soon as possible.  Feel free to call the clinic you have any questions or concerns. The clinic phone number is [phone].  Please show the [facility] at check-in to the Emergency Department and triage nurse.   Ramucirumab injection What is this medicine? RAMUCIRUMAB (ra mue SIR ue mab) is a monoclonal antibody. It is used to treat stomach cancer, colorectal cancer, or lung cancer. This medicine may be used for other purposes; ask your health care provider or pharmacist if you have questions. What should I tell my health care provider before I take this medicine? They need to know if you have any of these conditions: -bleeding disorders -blood clots -heart disease, including heart failure, heart attack, or chest pain (angina) -high blood pressure -infection (especially a virus infection such as chickenpox, cold sores, or herpes) -protein in your urine -recent surgery -stroke -an unusual or allergic reaction to ramucirumab, other medicines, foods, dyes, or preservatives -pregnant or trying to get pregnant -breast-feeding How should I use this  medicine? This medicine is for infusion into a vein. It is given by a health care professional in a hospital or clinic setting. Talk to your pediatrician regarding the use of this medicine in children. Special care may be needed. Overdosage: If you think you have taken too much of this medicine contact a poison control center or emergency room at once. NOTE: This medicine is only for you. Do not share this medicine with others. What if I miss a dose? It is important not to miss your dose. Call your doctor or health care professional if you are unable to keep an appointment. What may interact with this medicine? Interactions have not been studied. This list may not describe all possible interactions. Give your health care provider a list of all the medicines, herbs, non-prescription drugs, or dietary supplements you use. Also tell them if you smoke, drink alcohol, or use illegal drugs. Some items may interact with your medicine. What should I watch for while using this medicine? Your condition will be monitored carefully while you are receiving this medicine. You will need to to check your blood pressure and have your blood and urine tested while you are taking this medicine. Your condition will be monitored carefully while you are receiving this medicine. This medicine may increase your risk to bruise or bleed. Call your doctor or health care professional if you notice any unusual bleeding. This medicine may rarely cause 'gastrointestinal perforation' (holes in the stomach, intestines or colon), a serious side effect requiring surgery to repair. This medicine should be started at least 28 days following major surgery and the site of the surgery should be totally healed. Check with  your doctor before scheduling dental work or surgery while you are receiving this treatment. Talk to your doctor if you have recently had surgery or if you have a wound that has not healed. Do not become pregnant while  taking this medicine or for 3 months after stopping it. Women should inform their doctor if they wish to become pregnant or think they might be pregnant. There is a potential for serious side effects to an unborn child. Talk to your health care professional or pharmacist for more information. What side effects may I notice from receiving this medicine? Side effects that you should report to your doctor or health care professional as soon as possible: -allergic reactions like skin rash, itching or hives, breathing problems, swelling of the face, lips, or tongue -signs of infection - fever or chills, cough, sore throat -chest pain or chest tightness -confusion -dizziness -feeling faint or lightheaded, falls -severe abdominal pain -severe nausea, vomiting -signs and symptoms of bleeding such as bloody or black, tarry stools; red or dark-brown urine; spitting up blood or brown material that looks like coffee grounds; red spots on the skin; unusual bruising or bleeding from the eye, gums, or nose -signs and symptoms of a blood clot such as breathing problems; changes in vision; chest pain; severe, sudden headache; pain, swelling, warmth in the leg; trouble speaking; sudden numbness or weakness of the face, arm or leg -symptoms of a stroke: change in mental awareness, inability to talk or move one side of the body -trouble walking, dizziness, loss of balance or coordination Side effects that usually do not require medical attention (Report these to your doctor or health care professional if they continue or are bothersome.): -cold, clammy skin -constipation -diarrhea -headache -nausea, vomiting -stomach pain -unusually slow heartbeat -unusually weak or tired This list may not describe all possible side effects. Call your doctor for medical advice about side effects. You may report side effects to FDA at 1-800-FDA-1088. Where should I keep my medicine? This drug is given in a hospital or clinic  and will not be stored at home. NOTE: This sheet is a summary. It may not cover all possible information. If you have questions about this medicine, talk to your doctor, pharmacist, or health care provider.    2016, Elsevier/Gold Standard. (2014-06-25 17:37:19)  Paclitaxel injection What is this medicine? PACLITAXEL (PAK li TAX el) is a chemotherapy drug. It targets fast dividing cells, like cancer cells, and causes these cells to die. This medicine is used to treat ovarian cancer, breast cancer, and other cancers. This medicine may be used for other purposes; ask your health care provider or pharmacist if you have questions. What should I tell my health care provider before I take this medicine? They need to know if you have any of these conditions: -blood disorders -irregular heartbeat -infection (especially a virus infection such as chickenpox, cold sores, or herpes) -liver disease -previous or ongoing radiation therapy -an unusual or allergic reaction to paclitaxel, alcohol, polyoxyethylated castor oil, other chemotherapy agents, other medicines, foods, dyes, or preservatives -pregnant or trying to get pregnant -breast-feeding How should I use this medicine? This drug is given as an infusion into a vein. It is administered in a hospital or clinic by a specially trained health care professional. Talk to your pediatrician regarding the use of this medicine in children. Special care may be needed. Overdosage: If you think you have taken too much of this medicine contact a poison control center or emergency room at  once. NOTE: This medicine is only for you. Do not share this medicine with others. What if I miss a dose? It is important not to miss your dose. Call your doctor or health care professional if you are unable to keep an appointment. What may interact with this medicine? Do not take this medicine with any of the following medications: -disulfiram -metronidazole This medicine  may also interact with the following medications: -cyclosporine -diazepam -ketoconazole -medicines to increase blood counts like filgrastim, pegfilgrastim, sargramostim -other chemotherapy drugs like cisplatin, doxorubicin, epirubicin, etoposide, teniposide, vincristine -quinidine -testosterone -vaccines -verapamil Talk to your doctor or health care professional before taking any of these medicines: -acetaminophen -aspirin -ibuprofen -ketoprofen -naproxen This list may not describe all possible interactions. Give your health care provider a list of all the medicines, herbs, non-prescription drugs, or dietary supplements you use. Also tell them if you smoke, drink alcohol, or use illegal drugs. Some items may interact with your medicine. What should I watch for while using this medicine? Your condition will be monitored carefully while you are receiving this medicine. You will need important blood work done while you are taking this medicine. This drug may make you feel generally unwell. This is not uncommon, as chemotherapy can affect healthy cells as well as cancer cells. Report any side effects. Continue your course of treatment even though you feel ill unless your doctor tells you to stop. This medicine can cause serious allergic reactions. To reduce your risk you will need to take other medicine(s) before treatment with this medicine. In some cases, you may be given additional medicines to help with side effects. Follow all directions for their use. Call your doctor or health care professional for advice if you get a fever, chills or sore throat, or other symptoms of a cold or flu. Do not treat yourself. This drug decreases your body's ability to fight infections. Try to avoid being around people who are sick. This medicine may increase your risk to bruise or bleed. Call your doctor or health care professional if you notice any unusual bleeding. Be careful brushing and flossing your teeth  or using a toothpick because you may get an infection or bleed more easily. If you have any dental work done, tell your dentist you are receiving this medicine. Avoid taking products that contain aspirin, acetaminophen, ibuprofen, naproxen, or ketoprofen unless instructed by your doctor. These medicines may hide a fever. Do not become pregnant while taking this medicine. Women should inform their doctor if they wish to become pregnant or think they might be pregnant. There is a potential for serious side effects to an unborn child. Talk to your health care professional or pharmacist for more information. Do not breast-feed an infant while taking this medicine. Men are advised not to father a child while receiving this medicine. This product may contain alcohol. Ask your pharmacist or healthcare provider if this medicine contains alcohol. Be sure to tell all healthcare providers you are taking this medicine. Certain medicines, like metronidazole and disulfiram, can cause an unpleasant reaction when taken with alcohol. The reaction includes flushing, headache, nausea, vomiting, sweating, and increased thirst. The reaction can last from 30 minutes to several hours. What side effects may I notice from receiving this medicine? Side effects that you should report to your doctor or health care professional as soon as possible: -allergic reactions like skin rash, itching or hives, swelling of the face, lips, or tongue -low blood counts - This drug may decrease the number  of white blood cells, red blood cells and platelets. You may be at increased risk for infections and bleeding. -signs of infection - fever or chills, cough, sore throat, pain or difficulty passing urine -signs of decreased platelets or bleeding - bruising, pinpoint red spots on the skin, black, tarry stools, nosebleeds -signs of decreased red blood cells - unusually weak or tired, fainting spells, lightheadedness -breathing problems -chest  pain -high or low blood pressure -mouth sores -nausea and vomiting -pain, swelling, redness or irritation at the injection site -pain, tingling, numbness in the hands or feet -slow or irregular heartbeat -swelling of the ankle, feet, hands Side effects that usually do not require medical attention (report to your doctor or health care professional if they continue or are bothersome): -bone pain -complete hair loss including hair on your head, underarms, pubic hair, eyebrows, and eyelashes -changes in the color of fingernails -diarrhea -loosening of the fingernails -loss of appetite -muscle or joint pain -red flush to skin -sweating This list may not describe all possible side effects. Call your doctor for medical advice about side effects. You may report side effects to FDA at 1-800-FDA-1088. Where should I keep my medicine? This drug is given in a hospital or clinic and will not be stored at home. NOTE: This sheet is a summary. It may not cover all possible information. If you have questions about this medicine, talk to your doctor, pharmacist, or health care provider.    2016, Elsevier/Gold Standard. (2014-12-12 13:02:56)

## 2016-03-23 NOTE — Progress Notes (Signed)
Approximately 163mls spilled out of Taxol bag when hanging.  Spill cleaned per protocol and new bag of Taxol obtained from pharmacy.  New bag checked by 2 RNs, this RN and [employer].

## 2016-03-24 ENCOUNTER — Other Ambulatory Visit: Payer: Self-pay | Admitting: *Deleted

## 2016-03-24 ENCOUNTER — Ambulatory Visit (HOSPITAL_COMMUNITY): Payer: BLUE CROSS/BLUE SHIELD

## 2016-03-24 DIAGNOSIS — C169 Malignant neoplasm of stomach, unspecified: Secondary | ICD-10-CM

## 2016-03-27 ENCOUNTER — Ambulatory Visit (HOSPITAL_BASED_OUTPATIENT_CLINIC_OR_DEPARTMENT_OTHER): Payer: BLUE CROSS/BLUE SHIELD

## 2016-03-27 VITALS — BP 130/74 | HR 85 | Temp 98.1°F | Resp 18

## 2016-03-27 DIAGNOSIS — C169 Malignant neoplasm of stomach, unspecified: Secondary | ICD-10-CM

## 2016-03-27 MED ORDER — SODIUM CHLORIDE 0.9 % IJ SOLN
10.0000 mL | Freq: Once | INTRAMUSCULAR | Status: AC
  Administered 2016-03-27: 10 mL
  Filled 2016-03-27: qty 10

## 2016-03-27 MED ORDER — SODIUM CHLORIDE 0.9 % IV SOLN
Freq: Once | INTRAVENOUS | Status: AC
  Administered 2016-03-27: 11:00:00 via INTRAVENOUS

## 2016-03-27 MED ORDER — HEPARIN SOD (PORK) LOCK FLUSH 100 UNIT/ML IV SOLN
500.0000 [IU] | Freq: Once | INTRAVENOUS | Status: AC
  Administered 2016-03-27: 500 [IU]
  Filled 2016-03-27: qty 5

## 2016-03-27 NOTE — Progress Notes (Signed)
1000  -  Pt came to infusion room stating he felt dehydrated.  Requested to have IVF.  Stated eating and drinking ok, had mild nausea but no vomiting.  Denied pain.  Had mild diarrhea - relieved by Lomotil.   Dr. Lindi Adie notified.  Order received for 1L NS.  Explanations given to pt and sister.  Both voiced understanding.

## 2016-03-29 ENCOUNTER — Ambulatory Visit (HOSPITAL_COMMUNITY): Payer: BLUE CROSS/BLUE SHIELD

## 2016-03-29 ENCOUNTER — Telehealth: Payer: Self-pay | Admitting: *Deleted

## 2016-03-31 ENCOUNTER — Other Ambulatory Visit: Payer: BLUE CROSS/BLUE SHIELD

## 2016-03-31 ENCOUNTER — Ambulatory Visit (HOSPITAL_COMMUNITY): Payer: BLUE CROSS/BLUE SHIELD

## 2016-03-31 ENCOUNTER — Ambulatory Visit: Payer: BLUE CROSS/BLUE SHIELD

## 2016-03-31 ENCOUNTER — Telehealth: Payer: Self-pay

## 2016-03-31 NOTE — Telephone Encounter (Signed)
[facility] called for fax # for Dr Burr Medico to get death certificate signed. They are requesting return today d/t cremation tonight. Fax # given.

## 2016-04-04 IMAGING — CT CT ABD-PELV W/ CM
1 of 2 series · 13 of 32 positions shown, 17 images · IV contrast (omnipaque)
Comparison: No priors.

CLINICAL DATA: 38-year-old male with epigastric pain, vomiting and
dysphagia for the past several months.

EXAM:
CT ABDOMEN AND PELVIS WITH CONTRAST
TECHNIQUE: Multidetector CT imaging of the abdomen and pelvis was performed
using the standard protocol following bolus administration of
intravenous contrast.
CONTRAST:  100mL OMNIPAQUE IOHEXOL 300 MG/ML  SOLN

[Series 2: abd/pel with · axial · 0.96mm/px · z∈[-94,+351]mm · 13 of 103 slices shown, 17 images]
[im 9/103  soft-tissue]
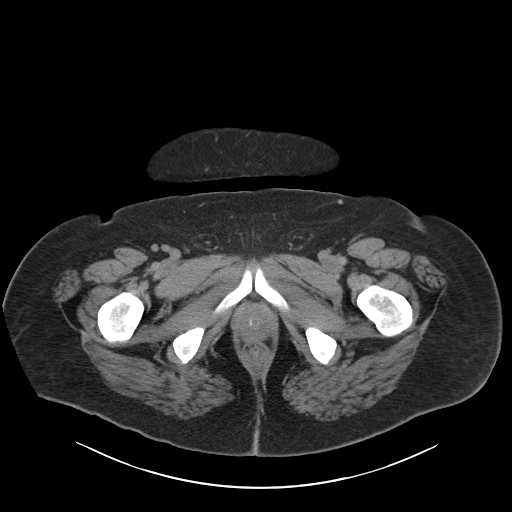
[im 9/103  bone]
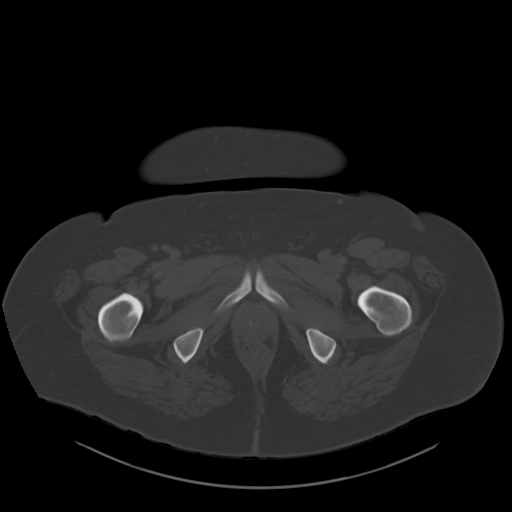
[im 18/103  soft-tissue]
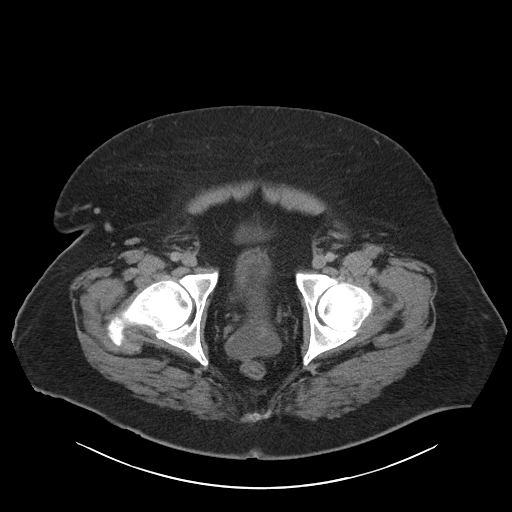
[im 26/103  soft-tissue]
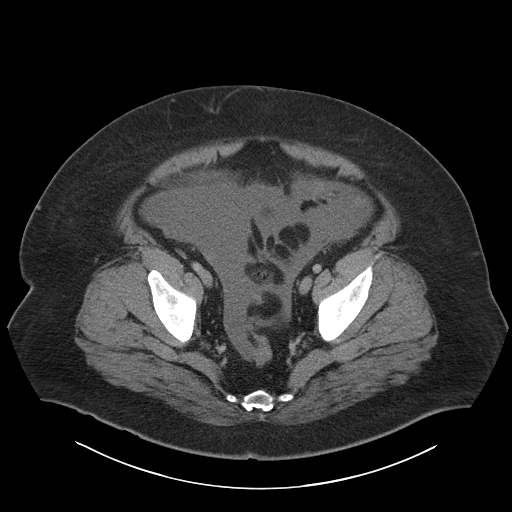
[im 35/103  soft-tissue]
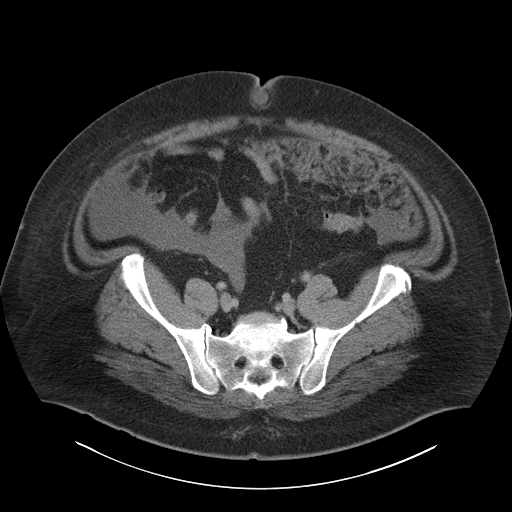
[im 43/103  soft-tissue]
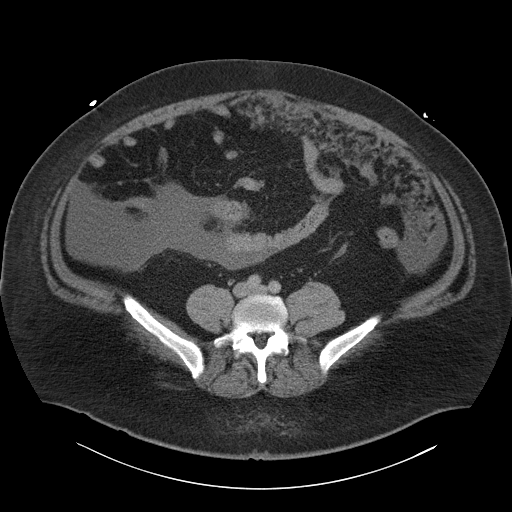
[im 52/103  soft-tissue]
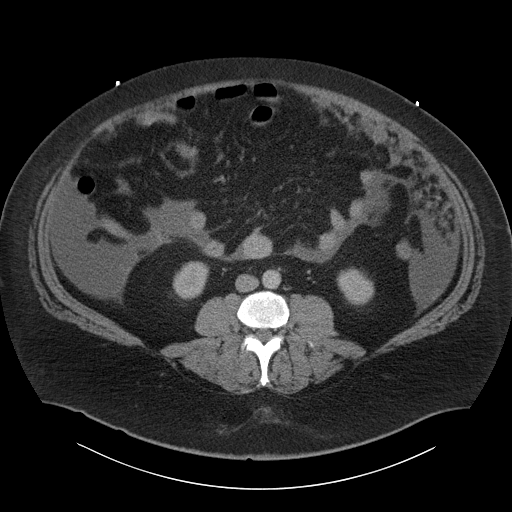
[im 60/103  soft-tissue]
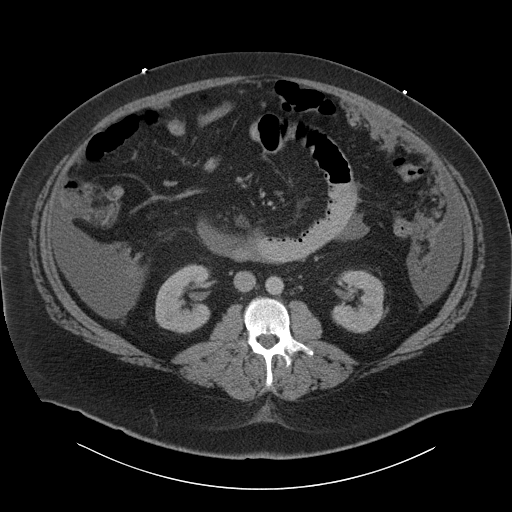
[im 69/103  soft-tissue]
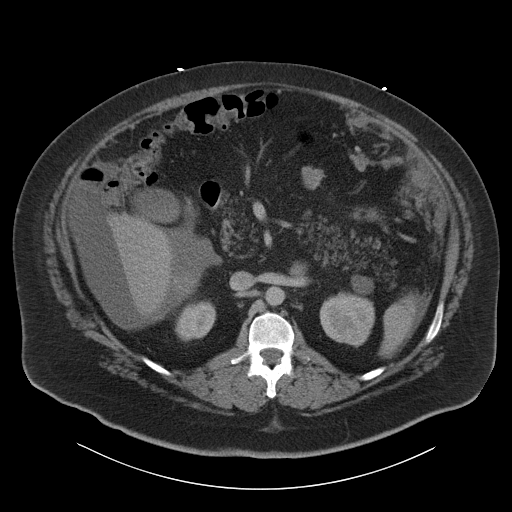
[im 77/103  soft-tissue]
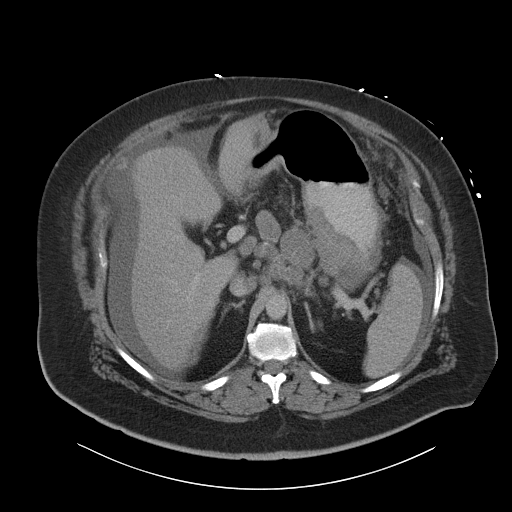
[im 77/103  bone]
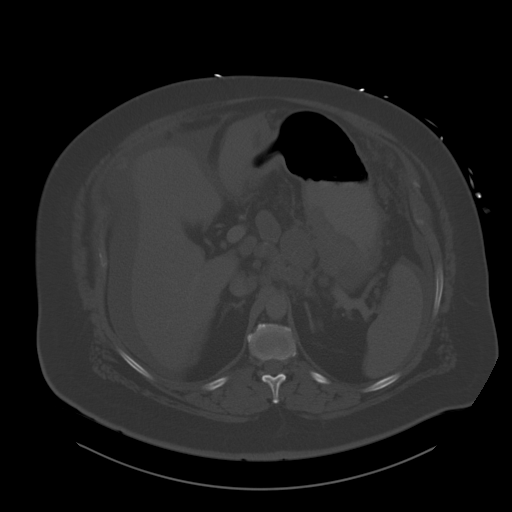
[im 86/103  soft-tissue]
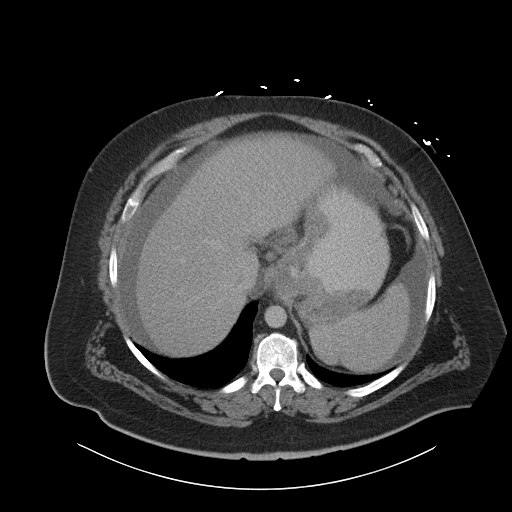
[im 86/103  lung]
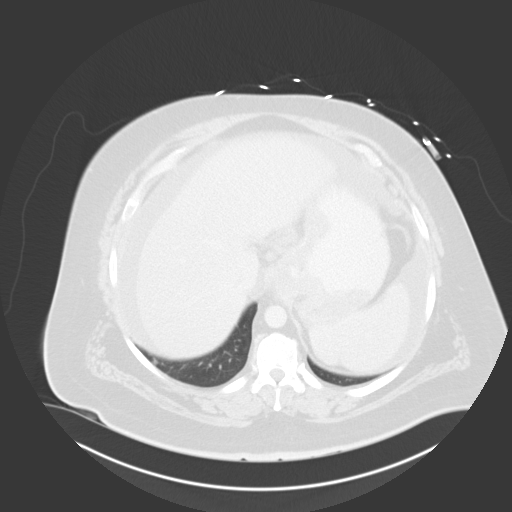
[im 90/103  lung]
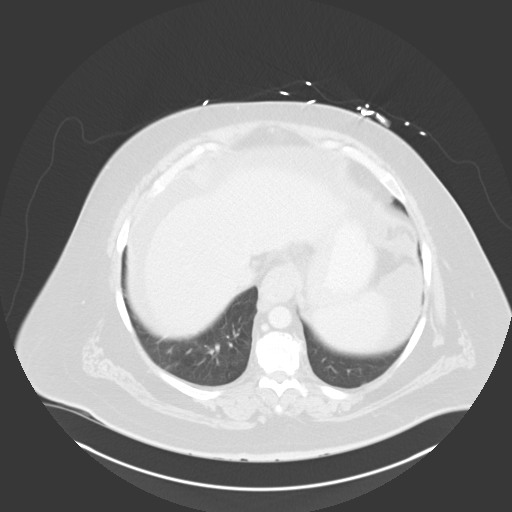
[im 94/103  soft-tissue]
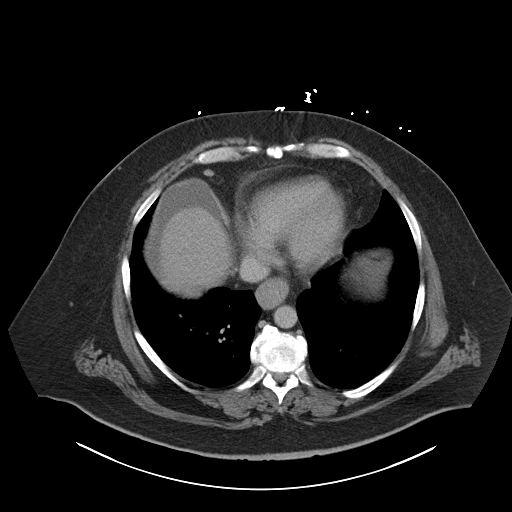
[im 94/103  lung]
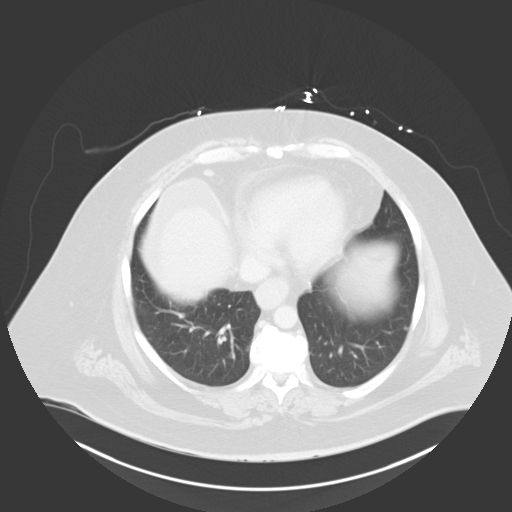
[im 98/103  lung]
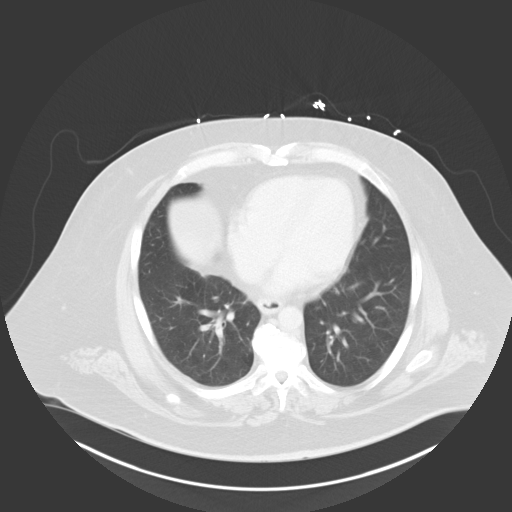

[13 of 32 positions shown; findings below may reference images not displayed]

FINDINGS: Lower chest: Several pulmonary nodules are noted in the visualized
lung bases bilaterally, the largest of which measures up to 1 cm in
the right lower lobe (image 12 of series 3). Profound thickening of
the distal esophagus extending beyond the gastroesophageal junction
into the proximal stomach.

Hepatobiliary: No cystic or solid hepatic lesions. No intra or
extrahepatic biliary ductal dilatation. Gallbladder is remarkable
for some amorphous increased attenuation dependently, likely to
reflect some biliary sludge.

Pancreas: Fatty atrophy in the pancreas.  Otherwise, unremarkable.

Spleen: Unremarkable.

Adrenals/Urinary Tract: Bilateral adrenal glands and the right
kidney are unremarkable in appearance. Exophytic 2.4 cm
low-attenuation lesion in the anterior aspect of the upper pole of
the left kidney is compatible with a simple cyst. No
hydroureteronephrosis. Urinary bladder is normal in appearance.

Stomach/Bowel: Marked thickening of the gastric wall at the
gastroesophageal junction, extending into the cardia and fundus of
the stomach, most evident along the lesser curvature. This is
contiguous with the previously mentioned esophageal wall thickening.
No pathologic dilatation of small bowel or colon.

Vascular/Lymphatic: Extensive upper abdominal lymphadenopathy,
predominantly in the gastrohepatic ligament and celiac axis nodal
stations, with the largest single lymph node measuring up to 3.7 x
3.0 cm in the celiac axis nodal station (image 27 of series 2).
Portacaval lymphadenopathy measuring up to 1.7 cm. Notably, many of
the enlarged celiac axis lymph nodes appear partially calcified,
which could suggest a mucinous subtype of neoplasm. Enlarged left
paraaortic retroperitoneal lymph nodes measuring up to 2.4 x 4.2 cm
adjacent to the left renal vein (image 33 of series 2). No
significant atherosclerotic disease in the abdominal or pelvic
vasculature.

Reproductive: Prostate gland and seminal vesicles are unremarkable
in appearance.

Other: Moderate volume of ascites, presumably malignant. Extensive
soft tissue nodularity throughout the omentum, compatible with
omental caking and widespread peritoneal metastasis. Additionally,
there are other areas of enhancing nodularity along the peritoneal
surface. Tiny ventral and umbilical hernias containing predominantly
omental fat, including some omental implants. No pneumoperitoneum.

Musculoskeletal: There are no aggressive appearing lytic or blastic
lesions noted in the visualized portions of the skeleton.
IMPRESSION: 1. Findings, as above, highly concerning for either esophageal or
gastric primary malignancy, with metastatic lymphadenopathy in the
upper abdomen and retroperitoneum, widespread intraperitoneal spread
of disease, malignant ascites, and multiple pulmonary nodules in the
visualize lung bases concerning for pulmonary metastasis. Oncologic
evaluation is recommended.
2. Additional incidental findings, as above.
These results were called by telephone at the time of interpretation
on 04/23/2014 at [DATE] to PA JASLINE SYA , who verbally
acknowledged these results.

## 2016-04-06 ENCOUNTER — Ambulatory Visit: Payer: BLUE CROSS/BLUE SHIELD

## 2016-04-06 ENCOUNTER — Other Ambulatory Visit: Payer: BLUE CROSS/BLUE SHIELD

## 2016-04-07 ENCOUNTER — Other Ambulatory Visit: Payer: BLUE CROSS/BLUE SHIELD

## 2016-04-07 ENCOUNTER — Ambulatory Visit: Payer: BLUE CROSS/BLUE SHIELD | Admitting: Hematology

## 2016-04-07 ENCOUNTER — Ambulatory Visit: Payer: BLUE CROSS/BLUE SHIELD

## 2016-04-09 NOTE — Telephone Encounter (Signed)
Received a call from the paramedics stating,"patient passed away this morning. We are at his residence now. I want to make sure there is a physician who can sign the death certificate. Instructed him there will be a physician here to sign.

## 2016-04-09 DEATH — deceased

## 2016-04-13 ENCOUNTER — Ambulatory Visit: Payer: BLUE CROSS/BLUE SHIELD

## 2016-04-20 ENCOUNTER — Ambulatory Visit: Payer: BLUE CROSS/BLUE SHIELD

## 2016-04-23 ENCOUNTER — Other Ambulatory Visit: Payer: Self-pay | Admitting: Nurse Practitioner

## 2016-05-04 ENCOUNTER — Ambulatory Visit: Payer: BLUE CROSS/BLUE SHIELD
